# Patient Record
Sex: Female | Born: 1945 | Race: Black or African American | Hispanic: No | Smoking: Former smoker
Health system: Southern US, Community
[De-identification: ages and names within clinical notes are randomized; demographics above are authoritative.]

## PROBLEM LIST (undated history)

## (undated) DIAGNOSIS — J4489 Other specified chronic obstructive pulmonary disease: Secondary | ICD-10-CM

## (undated) DIAGNOSIS — J40 Bronchitis, not specified as acute or chronic: Secondary | ICD-10-CM

## (undated) DIAGNOSIS — I10 Essential (primary) hypertension: Secondary | ICD-10-CM

## (undated) DIAGNOSIS — J939 Pneumothorax, unspecified: Secondary | ICD-10-CM

## (undated) DIAGNOSIS — J449 Chronic obstructive pulmonary disease, unspecified: Secondary | ICD-10-CM

## (undated) DIAGNOSIS — J45909 Unspecified asthma, uncomplicated: Secondary | ICD-10-CM

## (undated) HISTORY — DX: Unspecified asthma, uncomplicated: J45.909

## (undated) HISTORY — DX: Bronchitis, not specified as acute or chronic: J40

## (undated) HISTORY — PX: HIP ARTHROPLASTY: SHX981

## (undated) HISTORY — DX: Essential (primary) hypertension: I10

## (undated) HISTORY — DX: Pneumothorax, unspecified: J93.9

---

## 2012-04-11 ENCOUNTER — Ambulatory Visit: Payer: Self-pay | Admitting: Podiatry

## 2016-01-29 DIAGNOSIS — M0579 Rheumatoid arthritis with rheumatoid factor of multiple sites without organ or systems involvement: Secondary | ICD-10-CM | POA: Diagnosis not present

## 2016-01-29 DIAGNOSIS — Z79899 Other long term (current) drug therapy: Secondary | ICD-10-CM | POA: Diagnosis not present

## 2016-02-11 DIAGNOSIS — M2550 Pain in unspecified joint: Secondary | ICD-10-CM | POA: Diagnosis not present

## 2016-02-11 DIAGNOSIS — I10 Essential (primary) hypertension: Secondary | ICD-10-CM | POA: Diagnosis not present

## 2016-02-11 DIAGNOSIS — E1140 Type 2 diabetes mellitus with diabetic neuropathy, unspecified: Secondary | ICD-10-CM | POA: Diagnosis not present

## 2016-02-11 DIAGNOSIS — Z5181 Encounter for therapeutic drug level monitoring: Secondary | ICD-10-CM | POA: Diagnosis not present

## 2016-02-11 DIAGNOSIS — K219 Gastro-esophageal reflux disease without esophagitis: Secondary | ICD-10-CM | POA: Diagnosis not present

## 2016-02-11 DIAGNOSIS — E119 Type 2 diabetes mellitus without complications: Secondary | ICD-10-CM | POA: Diagnosis not present

## 2016-02-11 DIAGNOSIS — J45909 Unspecified asthma, uncomplicated: Secondary | ICD-10-CM | POA: Diagnosis not present

## 2016-02-11 DIAGNOSIS — E785 Hyperlipidemia, unspecified: Secondary | ICD-10-CM | POA: Diagnosis not present

## 2016-04-08 DIAGNOSIS — I10 Essential (primary) hypertension: Secondary | ICD-10-CM | POA: Diagnosis not present

## 2016-04-08 DIAGNOSIS — E1140 Type 2 diabetes mellitus with diabetic neuropathy, unspecified: Secondary | ICD-10-CM | POA: Diagnosis not present

## 2016-04-08 DIAGNOSIS — M2550 Pain in unspecified joint: Secondary | ICD-10-CM | POA: Diagnosis not present

## 2016-04-08 DIAGNOSIS — E119 Type 2 diabetes mellitus without complications: Secondary | ICD-10-CM | POA: Diagnosis not present

## 2016-04-08 DIAGNOSIS — E785 Hyperlipidemia, unspecified: Secondary | ICD-10-CM | POA: Diagnosis not present

## 2016-04-08 DIAGNOSIS — J45909 Unspecified asthma, uncomplicated: Secondary | ICD-10-CM | POA: Diagnosis not present

## 2016-04-08 DIAGNOSIS — R0602 Shortness of breath: Secondary | ICD-10-CM | POA: Diagnosis not present

## 2016-04-08 DIAGNOSIS — K219 Gastro-esophageal reflux disease without esophagitis: Secondary | ICD-10-CM | POA: Diagnosis not present

## 2016-04-29 DIAGNOSIS — Z79899 Other long term (current) drug therapy: Secondary | ICD-10-CM | POA: Diagnosis not present

## 2016-04-29 DIAGNOSIS — M0579 Rheumatoid arthritis with rheumatoid factor of multiple sites without organ or systems involvement: Secondary | ICD-10-CM | POA: Diagnosis not present

## 2016-05-09 DIAGNOSIS — E1140 Type 2 diabetes mellitus with diabetic neuropathy, unspecified: Secondary | ICD-10-CM | POA: Diagnosis not present

## 2016-05-09 DIAGNOSIS — E119 Type 2 diabetes mellitus without complications: Secondary | ICD-10-CM | POA: Diagnosis not present

## 2016-05-09 DIAGNOSIS — R52 Pain, unspecified: Secondary | ICD-10-CM | POA: Diagnosis not present

## 2016-05-09 DIAGNOSIS — I10 Essential (primary) hypertension: Secondary | ICD-10-CM | POA: Diagnosis not present

## 2016-05-09 DIAGNOSIS — J45909 Unspecified asthma, uncomplicated: Secondary | ICD-10-CM | POA: Diagnosis not present

## 2016-05-09 DIAGNOSIS — K219 Gastro-esophageal reflux disease without esophagitis: Secondary | ICD-10-CM | POA: Diagnosis not present

## 2016-05-09 DIAGNOSIS — E785 Hyperlipidemia, unspecified: Secondary | ICD-10-CM | POA: Diagnosis not present

## 2016-05-22 DIAGNOSIS — Z791 Long term (current) use of non-steroidal anti-inflammatories (NSAID): Secondary | ICD-10-CM | POA: Diagnosis not present

## 2016-05-22 DIAGNOSIS — Z79899 Other long term (current) drug therapy: Secondary | ICD-10-CM | POA: Diagnosis not present

## 2016-05-22 DIAGNOSIS — M25531 Pain in right wrist: Secondary | ICD-10-CM | POA: Diagnosis not present

## 2016-05-22 DIAGNOSIS — M19041 Primary osteoarthritis, right hand: Secondary | ICD-10-CM | POA: Diagnosis not present

## 2016-05-22 DIAGNOSIS — M79641 Pain in right hand: Secondary | ICD-10-CM | POA: Diagnosis not present

## 2016-05-22 DIAGNOSIS — Z7982 Long term (current) use of aspirin: Secondary | ICD-10-CM | POA: Diagnosis not present

## 2016-05-22 DIAGNOSIS — M7989 Other specified soft tissue disorders: Secondary | ICD-10-CM | POA: Diagnosis not present

## 2016-05-22 DIAGNOSIS — I10 Essential (primary) hypertension: Secondary | ICD-10-CM | POA: Diagnosis not present

## 2016-05-22 DIAGNOSIS — M19031 Primary osteoarthritis, right wrist: Secondary | ICD-10-CM | POA: Diagnosis not present

## 2016-07-26 DIAGNOSIS — E785 Hyperlipidemia, unspecified: Secondary | ICD-10-CM | POA: Diagnosis not present

## 2016-07-26 DIAGNOSIS — J45909 Unspecified asthma, uncomplicated: Secondary | ICD-10-CM | POA: Diagnosis not present

## 2016-07-26 DIAGNOSIS — Z0000 Encounter for general adult medical examination without abnormal findings: Secondary | ICD-10-CM | POA: Diagnosis not present

## 2016-07-26 DIAGNOSIS — E119 Type 2 diabetes mellitus without complications: Secondary | ICD-10-CM | POA: Diagnosis not present

## 2016-07-26 DIAGNOSIS — I10 Essential (primary) hypertension: Secondary | ICD-10-CM | POA: Diagnosis not present

## 2016-07-26 DIAGNOSIS — R1013 Epigastric pain: Secondary | ICD-10-CM | POA: Diagnosis not present

## 2016-07-26 DIAGNOSIS — E1140 Type 2 diabetes mellitus with diabetic neuropathy, unspecified: Secondary | ICD-10-CM | POA: Diagnosis not present

## 2016-07-26 DIAGNOSIS — R52 Pain, unspecified: Secondary | ICD-10-CM | POA: Diagnosis not present

## 2016-07-26 DIAGNOSIS — K219 Gastro-esophageal reflux disease without esophagitis: Secondary | ICD-10-CM | POA: Diagnosis not present

## 2016-07-29 DIAGNOSIS — R0602 Shortness of breath: Secondary | ICD-10-CM | POA: Diagnosis not present

## 2016-07-29 DIAGNOSIS — I10 Essential (primary) hypertension: Secondary | ICD-10-CM | POA: Diagnosis not present

## 2016-09-05 DIAGNOSIS — J45909 Unspecified asthma, uncomplicated: Secondary | ICD-10-CM | POA: Diagnosis not present

## 2016-09-05 DIAGNOSIS — E785 Hyperlipidemia, unspecified: Secondary | ICD-10-CM | POA: Diagnosis not present

## 2016-09-05 DIAGNOSIS — I10 Essential (primary) hypertension: Secondary | ICD-10-CM | POA: Diagnosis not present

## 2016-09-05 DIAGNOSIS — E1140 Type 2 diabetes mellitus with diabetic neuropathy, unspecified: Secondary | ICD-10-CM | POA: Diagnosis not present

## 2016-09-05 DIAGNOSIS — R52 Pain, unspecified: Secondary | ICD-10-CM | POA: Diagnosis not present

## 2016-09-05 DIAGNOSIS — R1013 Epigastric pain: Secondary | ICD-10-CM | POA: Diagnosis not present

## 2016-09-05 DIAGNOSIS — E119 Type 2 diabetes mellitus without complications: Secondary | ICD-10-CM | POA: Diagnosis not present

## 2016-09-05 DIAGNOSIS — K219 Gastro-esophageal reflux disease without esophagitis: Secondary | ICD-10-CM | POA: Diagnosis not present

## 2016-10-07 DIAGNOSIS — R1013 Epigastric pain: Secondary | ICD-10-CM | POA: Diagnosis not present

## 2016-12-16 DIAGNOSIS — E119 Type 2 diabetes mellitus without complications: Secondary | ICD-10-CM | POA: Diagnosis not present

## 2016-12-16 DIAGNOSIS — R1013 Epigastric pain: Secondary | ICD-10-CM | POA: Diagnosis not present

## 2016-12-16 DIAGNOSIS — M069 Rheumatoid arthritis, unspecified: Secondary | ICD-10-CM | POA: Diagnosis not present

## 2016-12-16 DIAGNOSIS — R52 Pain, unspecified: Secondary | ICD-10-CM | POA: Diagnosis not present

## 2016-12-16 DIAGNOSIS — Z2821 Immunization not carried out because of patient refusal: Secondary | ICD-10-CM | POA: Diagnosis not present

## 2016-12-16 DIAGNOSIS — E785 Hyperlipidemia, unspecified: Secondary | ICD-10-CM | POA: Diagnosis not present

## 2016-12-16 DIAGNOSIS — I10 Essential (primary) hypertension: Secondary | ICD-10-CM | POA: Diagnosis not present

## 2016-12-16 DIAGNOSIS — K219 Gastro-esophageal reflux disease without esophagitis: Secondary | ICD-10-CM | POA: Diagnosis not present

## 2016-12-16 DIAGNOSIS — J45909 Unspecified asthma, uncomplicated: Secondary | ICD-10-CM | POA: Diagnosis not present

## 2016-12-16 DIAGNOSIS — E1140 Type 2 diabetes mellitus with diabetic neuropathy, unspecified: Secondary | ICD-10-CM | POA: Diagnosis not present

## 2017-04-11 DIAGNOSIS — R52 Pain, unspecified: Secondary | ICD-10-CM | POA: Diagnosis not present

## 2017-04-11 DIAGNOSIS — E1140 Type 2 diabetes mellitus with diabetic neuropathy, unspecified: Secondary | ICD-10-CM | POA: Diagnosis not present

## 2017-04-11 DIAGNOSIS — M069 Rheumatoid arthritis, unspecified: Secondary | ICD-10-CM | POA: Diagnosis not present

## 2017-04-11 DIAGNOSIS — K219 Gastro-esophageal reflux disease without esophagitis: Secondary | ICD-10-CM | POA: Diagnosis not present

## 2017-04-11 DIAGNOSIS — E119 Type 2 diabetes mellitus without complications: Secondary | ICD-10-CM | POA: Diagnosis not present

## 2017-04-11 DIAGNOSIS — I10 Essential (primary) hypertension: Secondary | ICD-10-CM | POA: Diagnosis not present

## 2017-04-11 DIAGNOSIS — R1013 Epigastric pain: Secondary | ICD-10-CM | POA: Diagnosis not present

## 2017-04-11 DIAGNOSIS — E785 Hyperlipidemia, unspecified: Secondary | ICD-10-CM | POA: Diagnosis not present

## 2017-04-11 DIAGNOSIS — J45909 Unspecified asthma, uncomplicated: Secondary | ICD-10-CM | POA: Diagnosis not present

## 2017-04-19 DIAGNOSIS — R600 Localized edema: Secondary | ICD-10-CM | POA: Diagnosis not present

## 2017-04-19 DIAGNOSIS — K047 Periapical abscess without sinus: Secondary | ICD-10-CM | POA: Diagnosis not present

## 2017-04-19 DIAGNOSIS — M272 Inflammatory conditions of jaws: Secondary | ICD-10-CM | POA: Diagnosis not present

## 2017-04-19 DIAGNOSIS — R221 Localized swelling, mass and lump, neck: Secondary | ICD-10-CM | POA: Diagnosis not present

## 2017-04-19 DIAGNOSIS — K029 Dental caries, unspecified: Secondary | ICD-10-CM | POA: Diagnosis not present

## 2017-05-31 DIAGNOSIS — M069 Rheumatoid arthritis, unspecified: Secondary | ICD-10-CM | POA: Diagnosis not present

## 2017-05-31 DIAGNOSIS — I10 Essential (primary) hypertension: Secondary | ICD-10-CM | POA: Diagnosis not present

## 2017-05-31 DIAGNOSIS — E785 Hyperlipidemia, unspecified: Secondary | ICD-10-CM | POA: Diagnosis not present

## 2017-05-31 DIAGNOSIS — E119 Type 2 diabetes mellitus without complications: Secondary | ICD-10-CM | POA: Diagnosis not present

## 2017-05-31 DIAGNOSIS — R1013 Epigastric pain: Secondary | ICD-10-CM | POA: Diagnosis not present

## 2017-05-31 DIAGNOSIS — J45909 Unspecified asthma, uncomplicated: Secondary | ICD-10-CM | POA: Diagnosis not present

## 2017-05-31 DIAGNOSIS — K219 Gastro-esophageal reflux disease without esophagitis: Secondary | ICD-10-CM | POA: Diagnosis not present

## 2017-05-31 DIAGNOSIS — E1140 Type 2 diabetes mellitus with diabetic neuropathy, unspecified: Secondary | ICD-10-CM | POA: Diagnosis not present

## 2017-07-14 DIAGNOSIS — Z0110 Encounter for examination of ears and hearing without abnormal findings: Secondary | ICD-10-CM | POA: Diagnosis not present

## 2017-07-14 DIAGNOSIS — M069 Rheumatoid arthritis, unspecified: Secondary | ICD-10-CM | POA: Diagnosis not present

## 2017-07-14 DIAGNOSIS — E785 Hyperlipidemia, unspecified: Secondary | ICD-10-CM | POA: Diagnosis not present

## 2017-07-14 DIAGNOSIS — E1140 Type 2 diabetes mellitus with diabetic neuropathy, unspecified: Secondary | ICD-10-CM | POA: Diagnosis not present

## 2017-07-14 DIAGNOSIS — J45909 Unspecified asthma, uncomplicated: Secondary | ICD-10-CM | POA: Diagnosis not present

## 2017-07-14 DIAGNOSIS — E119 Type 2 diabetes mellitus without complications: Secondary | ICD-10-CM | POA: Diagnosis not present

## 2017-07-14 DIAGNOSIS — K219 Gastro-esophageal reflux disease without esophagitis: Secondary | ICD-10-CM | POA: Diagnosis not present

## 2017-07-14 DIAGNOSIS — I10 Essential (primary) hypertension: Secondary | ICD-10-CM | POA: Diagnosis not present

## 2017-07-14 DIAGNOSIS — R1013 Epigastric pain: Secondary | ICD-10-CM | POA: Diagnosis not present

## 2017-07-31 DIAGNOSIS — M069 Rheumatoid arthritis, unspecified: Secondary | ICD-10-CM | POA: Diagnosis not present

## 2017-07-31 DIAGNOSIS — K219 Gastro-esophageal reflux disease without esophagitis: Secondary | ICD-10-CM | POA: Diagnosis not present

## 2017-07-31 DIAGNOSIS — E1140 Type 2 diabetes mellitus with diabetic neuropathy, unspecified: Secondary | ICD-10-CM | POA: Diagnosis not present

## 2017-07-31 DIAGNOSIS — Z0000 Encounter for general adult medical examination without abnormal findings: Secondary | ICD-10-CM | POA: Diagnosis not present

## 2017-07-31 DIAGNOSIS — E785 Hyperlipidemia, unspecified: Secondary | ICD-10-CM | POA: Diagnosis not present

## 2017-07-31 DIAGNOSIS — I10 Essential (primary) hypertension: Secondary | ICD-10-CM | POA: Diagnosis not present

## 2017-07-31 DIAGNOSIS — R1013 Epigastric pain: Secondary | ICD-10-CM | POA: Diagnosis not present

## 2017-07-31 DIAGNOSIS — J45909 Unspecified asthma, uncomplicated: Secondary | ICD-10-CM | POA: Diagnosis not present

## 2017-07-31 DIAGNOSIS — E119 Type 2 diabetes mellitus without complications: Secondary | ICD-10-CM | POA: Diagnosis not present

## 2017-10-05 DIAGNOSIS — I10 Essential (primary) hypertension: Secondary | ICD-10-CM | POA: Diagnosis not present

## 2017-10-05 DIAGNOSIS — R1013 Epigastric pain: Secondary | ICD-10-CM | POA: Diagnosis not present

## 2017-10-05 DIAGNOSIS — M069 Rheumatoid arthritis, unspecified: Secondary | ICD-10-CM | POA: Diagnosis not present

## 2017-10-05 DIAGNOSIS — K219 Gastro-esophageal reflux disease without esophagitis: Secondary | ICD-10-CM | POA: Diagnosis not present

## 2017-10-05 DIAGNOSIS — E1140 Type 2 diabetes mellitus with diabetic neuropathy, unspecified: Secondary | ICD-10-CM | POA: Diagnosis not present

## 2017-10-05 DIAGNOSIS — E785 Hyperlipidemia, unspecified: Secondary | ICD-10-CM | POA: Diagnosis not present

## 2017-10-05 DIAGNOSIS — J45909 Unspecified asthma, uncomplicated: Secondary | ICD-10-CM | POA: Diagnosis not present

## 2017-10-05 DIAGNOSIS — E119 Type 2 diabetes mellitus without complications: Secondary | ICD-10-CM | POA: Diagnosis not present

## 2017-10-30 DIAGNOSIS — J45909 Unspecified asthma, uncomplicated: Secondary | ICD-10-CM | POA: Diagnosis not present

## 2017-10-30 DIAGNOSIS — R1013 Epigastric pain: Secondary | ICD-10-CM | POA: Diagnosis not present

## 2017-10-30 DIAGNOSIS — E119 Type 2 diabetes mellitus without complications: Secondary | ICD-10-CM | POA: Diagnosis not present

## 2017-10-30 DIAGNOSIS — I10 Essential (primary) hypertension: Secondary | ICD-10-CM | POA: Diagnosis not present

## 2017-10-30 DIAGNOSIS — E1140 Type 2 diabetes mellitus with diabetic neuropathy, unspecified: Secondary | ICD-10-CM | POA: Diagnosis not present

## 2017-10-30 DIAGNOSIS — E785 Hyperlipidemia, unspecified: Secondary | ICD-10-CM | POA: Diagnosis not present

## 2017-10-30 DIAGNOSIS — K219 Gastro-esophageal reflux disease without esophagitis: Secondary | ICD-10-CM | POA: Diagnosis not present

## 2017-10-30 DIAGNOSIS — M069 Rheumatoid arthritis, unspecified: Secondary | ICD-10-CM | POA: Diagnosis not present

## 2017-11-13 DIAGNOSIS — K219 Gastro-esophageal reflux disease without esophagitis: Secondary | ICD-10-CM | POA: Diagnosis not present

## 2017-11-13 DIAGNOSIS — M545 Low back pain: Secondary | ICD-10-CM | POA: Diagnosis not present

## 2017-11-13 DIAGNOSIS — E1140 Type 2 diabetes mellitus with diabetic neuropathy, unspecified: Secondary | ICD-10-CM | POA: Diagnosis not present

## 2017-11-13 DIAGNOSIS — M069 Rheumatoid arthritis, unspecified: Secondary | ICD-10-CM | POA: Diagnosis not present

## 2017-11-13 DIAGNOSIS — Z23 Encounter for immunization: Secondary | ICD-10-CM | POA: Diagnosis not present

## 2017-11-13 DIAGNOSIS — R1013 Epigastric pain: Secondary | ICD-10-CM | POA: Diagnosis not present

## 2017-11-13 DIAGNOSIS — I10 Essential (primary) hypertension: Secondary | ICD-10-CM | POA: Diagnosis not present

## 2017-11-13 DIAGNOSIS — E785 Hyperlipidemia, unspecified: Secondary | ICD-10-CM | POA: Diagnosis not present

## 2017-11-13 DIAGNOSIS — J45909 Unspecified asthma, uncomplicated: Secondary | ICD-10-CM | POA: Diagnosis not present

## 2017-11-13 DIAGNOSIS — E119 Type 2 diabetes mellitus without complications: Secondary | ICD-10-CM | POA: Diagnosis not present

## 2017-11-13 DIAGNOSIS — Z5181 Encounter for therapeutic drug level monitoring: Secondary | ICD-10-CM | POA: Diagnosis not present

## 2018-03-05 DIAGNOSIS — E785 Hyperlipidemia, unspecified: Secondary | ICD-10-CM | POA: Diagnosis not present

## 2018-03-05 DIAGNOSIS — E1140 Type 2 diabetes mellitus with diabetic neuropathy, unspecified: Secondary | ICD-10-CM | POA: Diagnosis not present

## 2018-03-05 DIAGNOSIS — K219 Gastro-esophageal reflux disease without esophagitis: Secondary | ICD-10-CM | POA: Diagnosis not present

## 2018-03-05 DIAGNOSIS — I10 Essential (primary) hypertension: Secondary | ICD-10-CM | POA: Diagnosis not present

## 2018-03-05 DIAGNOSIS — E119 Type 2 diabetes mellitus without complications: Secondary | ICD-10-CM | POA: Diagnosis not present

## 2018-03-05 DIAGNOSIS — M069 Rheumatoid arthritis, unspecified: Secondary | ICD-10-CM | POA: Diagnosis not present

## 2018-03-05 DIAGNOSIS — J45909 Unspecified asthma, uncomplicated: Secondary | ICD-10-CM | POA: Diagnosis not present

## 2018-03-05 DIAGNOSIS — R1013 Epigastric pain: Secondary | ICD-10-CM | POA: Diagnosis not present

## 2018-04-23 DIAGNOSIS — M069 Rheumatoid arthritis, unspecified: Secondary | ICD-10-CM | POA: Diagnosis not present

## 2018-04-23 DIAGNOSIS — E119 Type 2 diabetes mellitus without complications: Secondary | ICD-10-CM | POA: Diagnosis not present

## 2018-04-23 DIAGNOSIS — E785 Hyperlipidemia, unspecified: Secondary | ICD-10-CM | POA: Diagnosis not present

## 2018-04-23 DIAGNOSIS — K219 Gastro-esophageal reflux disease without esophagitis: Secondary | ICD-10-CM | POA: Diagnosis not present

## 2018-04-23 DIAGNOSIS — E1140 Type 2 diabetes mellitus with diabetic neuropathy, unspecified: Secondary | ICD-10-CM | POA: Diagnosis not present

## 2018-04-23 DIAGNOSIS — R1013 Epigastric pain: Secondary | ICD-10-CM | POA: Diagnosis not present

## 2018-04-23 DIAGNOSIS — I10 Essential (primary) hypertension: Secondary | ICD-10-CM | POA: Diagnosis not present

## 2018-04-23 DIAGNOSIS — J45909 Unspecified asthma, uncomplicated: Secondary | ICD-10-CM | POA: Diagnosis not present

## 2018-06-04 DIAGNOSIS — E119 Type 2 diabetes mellitus without complications: Secondary | ICD-10-CM | POA: Diagnosis not present

## 2018-06-04 DIAGNOSIS — R1013 Epigastric pain: Secondary | ICD-10-CM | POA: Diagnosis not present

## 2018-06-04 DIAGNOSIS — Z1329 Encounter for screening for other suspected endocrine disorder: Secondary | ICD-10-CM | POA: Diagnosis not present

## 2018-06-04 DIAGNOSIS — J4530 Mild persistent asthma, uncomplicated: Secondary | ICD-10-CM | POA: Diagnosis not present

## 2018-06-04 DIAGNOSIS — E782 Mixed hyperlipidemia: Secondary | ICD-10-CM | POA: Diagnosis not present

## 2018-06-04 DIAGNOSIS — Z5181 Encounter for therapeutic drug level monitoring: Secondary | ICD-10-CM | POA: Diagnosis not present

## 2018-06-04 DIAGNOSIS — K219 Gastro-esophageal reflux disease without esophagitis: Secondary | ICD-10-CM | POA: Diagnosis not present

## 2018-06-04 DIAGNOSIS — M069 Rheumatoid arthritis, unspecified: Secondary | ICD-10-CM | POA: Diagnosis not present

## 2018-06-04 DIAGNOSIS — E1140 Type 2 diabetes mellitus with diabetic neuropathy, unspecified: Secondary | ICD-10-CM | POA: Diagnosis not present

## 2018-06-04 DIAGNOSIS — Z131 Encounter for screening for diabetes mellitus: Secondary | ICD-10-CM | POA: Diagnosis not present

## 2018-06-04 DIAGNOSIS — I10 Essential (primary) hypertension: Secondary | ICD-10-CM | POA: Diagnosis not present

## 2018-06-04 DIAGNOSIS — Z1389 Encounter for screening for other disorder: Secondary | ICD-10-CM | POA: Diagnosis not present

## 2018-08-01 DIAGNOSIS — M069 Rheumatoid arthritis, unspecified: Secondary | ICD-10-CM | POA: Diagnosis not present

## 2018-08-01 DIAGNOSIS — I10 Essential (primary) hypertension: Secondary | ICD-10-CM | POA: Diagnosis not present

## 2018-08-01 DIAGNOSIS — Z0001 Encounter for general adult medical examination with abnormal findings: Secondary | ICD-10-CM | POA: Diagnosis not present

## 2018-08-01 DIAGNOSIS — E782 Mixed hyperlipidemia: Secondary | ICD-10-CM | POA: Diagnosis not present

## 2018-08-01 DIAGNOSIS — R1013 Epigastric pain: Secondary | ICD-10-CM | POA: Diagnosis not present

## 2018-08-01 DIAGNOSIS — J4530 Mild persistent asthma, uncomplicated: Secondary | ICD-10-CM | POA: Diagnosis not present

## 2018-08-01 DIAGNOSIS — E119 Type 2 diabetes mellitus without complications: Secondary | ICD-10-CM | POA: Diagnosis not present

## 2018-08-01 DIAGNOSIS — E1140 Type 2 diabetes mellitus with diabetic neuropathy, unspecified: Secondary | ICD-10-CM | POA: Diagnosis not present

## 2018-08-01 DIAGNOSIS — K219 Gastro-esophageal reflux disease without esophagitis: Secondary | ICD-10-CM | POA: Diagnosis not present

## 2019-03-05 DIAGNOSIS — I10 Essential (primary) hypertension: Secondary | ICD-10-CM | POA: Diagnosis not present

## 2019-03-05 DIAGNOSIS — K219 Gastro-esophageal reflux disease without esophagitis: Secondary | ICD-10-CM | POA: Diagnosis not present

## 2019-03-05 DIAGNOSIS — M069 Rheumatoid arthritis, unspecified: Secondary | ICD-10-CM | POA: Diagnosis not present

## 2019-03-05 DIAGNOSIS — E782 Mixed hyperlipidemia: Secondary | ICD-10-CM | POA: Diagnosis not present

## 2019-03-05 DIAGNOSIS — E1140 Type 2 diabetes mellitus with diabetic neuropathy, unspecified: Secondary | ICD-10-CM | POA: Diagnosis not present

## 2019-03-05 DIAGNOSIS — E119 Type 2 diabetes mellitus without complications: Secondary | ICD-10-CM | POA: Diagnosis not present

## 2019-03-05 DIAGNOSIS — J4530 Mild persistent asthma, uncomplicated: Secondary | ICD-10-CM | POA: Diagnosis not present

## 2019-04-19 DIAGNOSIS — K219 Gastro-esophageal reflux disease without esophagitis: Secondary | ICD-10-CM | POA: Diagnosis not present

## 2019-04-19 DIAGNOSIS — M1711 Unilateral primary osteoarthritis, right knee: Secondary | ICD-10-CM | POA: Diagnosis not present

## 2019-04-19 DIAGNOSIS — E119 Type 2 diabetes mellitus without complications: Secondary | ICD-10-CM | POA: Diagnosis not present

## 2019-04-19 DIAGNOSIS — Z1329 Encounter for screening for other suspected endocrine disorder: Secondary | ICD-10-CM | POA: Diagnosis not present

## 2019-04-19 DIAGNOSIS — J4530 Mild persistent asthma, uncomplicated: Secondary | ICD-10-CM | POA: Diagnosis not present

## 2019-04-19 DIAGNOSIS — I10 Essential (primary) hypertension: Secondary | ICD-10-CM | POA: Diagnosis not present

## 2019-04-19 DIAGNOSIS — E1140 Type 2 diabetes mellitus with diabetic neuropathy, unspecified: Secondary | ICD-10-CM | POA: Diagnosis not present

## 2019-04-19 DIAGNOSIS — E782 Mixed hyperlipidemia: Secondary | ICD-10-CM | POA: Diagnosis not present

## 2019-09-25 DIAGNOSIS — E782 Mixed hyperlipidemia: Secondary | ICD-10-CM | POA: Diagnosis not present

## 2019-09-25 DIAGNOSIS — J4530 Mild persistent asthma, uncomplicated: Secondary | ICD-10-CM | POA: Diagnosis not present

## 2019-09-25 DIAGNOSIS — K219 Gastro-esophageal reflux disease without esophagitis: Secondary | ICD-10-CM | POA: Diagnosis not present

## 2019-09-25 DIAGNOSIS — E119 Type 2 diabetes mellitus without complications: Secondary | ICD-10-CM | POA: Diagnosis not present

## 2019-09-25 DIAGNOSIS — I10 Essential (primary) hypertension: Secondary | ICD-10-CM | POA: Diagnosis not present

## 2019-09-25 DIAGNOSIS — E1140 Type 2 diabetes mellitus with diabetic neuropathy, unspecified: Secondary | ICD-10-CM | POA: Diagnosis not present

## 2019-09-25 DIAGNOSIS — M1711 Unilateral primary osteoarthritis, right knee: Secondary | ICD-10-CM | POA: Diagnosis not present

## 2019-12-16 DIAGNOSIS — E1142 Type 2 diabetes mellitus with diabetic polyneuropathy: Secondary | ICD-10-CM | POA: Diagnosis not present

## 2019-12-16 DIAGNOSIS — I10 Essential (primary) hypertension: Secondary | ICD-10-CM | POA: Diagnosis not present

## 2019-12-16 DIAGNOSIS — E119 Type 2 diabetes mellitus without complications: Secondary | ICD-10-CM | POA: Diagnosis not present

## 2019-12-16 DIAGNOSIS — M1711 Unilateral primary osteoarthritis, right knee: Secondary | ICD-10-CM | POA: Diagnosis not present

## 2019-12-16 DIAGNOSIS — E782 Mixed hyperlipidemia: Secondary | ICD-10-CM | POA: Diagnosis not present

## 2019-12-16 DIAGNOSIS — J4530 Mild persistent asthma, uncomplicated: Secondary | ICD-10-CM | POA: Diagnosis not present

## 2019-12-16 DIAGNOSIS — K219 Gastro-esophageal reflux disease without esophagitis: Secondary | ICD-10-CM | POA: Diagnosis not present

## 2021-04-14 ENCOUNTER — Ambulatory Visit (INDEPENDENT_AMBULATORY_CARE_PROVIDER_SITE_OTHER): Payer: Medicare Other | Admitting: Pulmonary Disease

## 2021-04-14 ENCOUNTER — Encounter: Payer: Self-pay | Admitting: Pulmonary Disease

## 2021-04-14 VITALS — BP 122/60 | HR 66 | Temp 97.6°F | Ht 65.0 in | Wt 95.0 lb

## 2021-04-14 DIAGNOSIS — C3490 Malignant neoplasm of unspecified part of unspecified bronchus or lung: Secondary | ICD-10-CM

## 2021-04-14 DIAGNOSIS — R918 Other nonspecific abnormal finding of lung field: Secondary | ICD-10-CM | POA: Diagnosis not present

## 2021-04-14 MED ORDER — ETODOLAC 400 MG PO TABS: 400.0000 mg | ORAL_TABLET | Freq: Two times a day (BID) | ORAL | 0 refills | Status: DC | PRN

## 2021-04-14 NOTE — Patient Instructions (Signed)
Thank you for visiting Dr. Valeta Harms at [facility]. ?Today we recommend the following: ? ?Orders Placed This Encounter  ?Procedures  ? NM PET Image Initial (PI) Skull Base To Thigh (F-18 FDG)  ? ?Meds ordered this encounter  ?Medications  ? etodolac (LODINE) 400 MG tablet  ?  Sig: Take 1 tablet (400 mg total) by mouth every 12 (twelve) hours as needed.  ?  Dispense:  60 tablet  ?  Refill:  0  ? ?Follow up with [place] in a few weeks after PET CT  ? ?Return in about 3 weeks (around 05/05/2021) for with Eric Form, NP, or Dr. Valeta Harms. ? ? ? ?Please do your part to reduce the spread of COVID-19.  ? ?

## 2021-04-14 NOTE — Progress Notes (Signed)
? ?Synopsis: Referred in March 2023 for lung mass by [name], MD ? ?Subjective:  ? ?PATIENT ID: Anna Finley GENDER: female DOB: 1945/11/09, MRN: [id number] ? ?Chief Complaint  ?Patient presents with  ? Consult  ?  RLL lung mass   ? ? ?This is a 76 year old female, past medical history of asthma, hypertension, COPD.  Longstanding history of smoking quit in approximately 10 years ago after having a spontaneous pneumothorax.  She had symptoms of pneumonia had a chest x-ray with an abnormality on the right side led to CT imaging which revealed a large right lower lobe mass with concern for postobstructive pneumonia.  She was started on Levaquin by primary care provider.  She started to feel somewhat better.  No longer having fevers.  I was able to review her CT imaging in epic care everywhere today with patient.  Also reviewed office primary care notes from Dr. Vista Lawman, [facility].  We appreciate the referral. ? ? ?Past Medical History:  ?Diagnosis Date  ? Asthma   ? Bronchitis   ? HTN (hypertension)   ? Pneumothorax   ?  ? ?Family History  ?Problem Relation Age of Onset  ? Cancer Father   ?  ? ?Past Surgical History:  ?Procedure Laterality Date  ? HIP ARTHROPLASTY    ? ? ?Social History  ? ?Socioeconomic History  ? Marital status: Widowed  ?  Spouse name: Not on file  ? Number of children: Not on file  ? Years of education: Not on file  ? Highest education level: Not on file  ?Occupational History  ? Not on file  ?Tobacco Use  ? Smoking status: Former  ?  Types: Cigarettes  ? Smokeless tobacco: Former  ?  Quit date: 2013  ?Substance and Sexual Activity  ? Alcohol use: Not on file  ? Drug use: Not on file  ? Sexual activity: Not on file  ?Other Topics Concern  ? Not on file  ?Social History Narrative  ? Not on file  ? ?Social Determinants of Health  ? ?Financial Resource Strain: Not on file  ?Food Insecurity: Not on file  ?Transportation Needs: Not on file  ?Physical Activity: Not on file   ?Stress: Not on file  ?Social Connections: Not on file  ?Intimate Partner Violence: Not on file  ?  ? ?Not on File  ? ?No outpatient medications prior to visit.  ? ?No facility-administered medications prior to visit.  ? ? ?Review of Systems  ?Constitutional:  Positive for weight loss. Negative for chills, fever and malaise/fatigue.  ?HENT:  Negative for hearing loss, sore throat and tinnitus.   ?Eyes:  Negative for blurred vision and double vision.  ?Respiratory:  Positive for shortness of breath. Negative for cough, hemoptysis, sputum production, wheezing and stridor.   ?Cardiovascular:  Negative for chest pain, palpitations, orthopnea, leg swelling and PND.  ?Gastrointestinal:  Negative for abdominal pain, constipation, diarrhea, heartburn, nausea and vomiting.  ?Genitourinary:  Negative for dysuria, hematuria and urgency.  ?Musculoskeletal:  Negative for joint pain and myalgias.  ?Skin:  Negative for itching and rash.  ?Neurological:  Negative for dizziness, tingling, weakness and headaches.  ?Endo/Heme/Allergies:  Negative for environmental allergies. Does not bruise/bleed easily.  ?Psychiatric/Behavioral:  Negative for depression. The patient is not nervous/anxious and does not have insomnia.   ?All other systems reviewed and are negative. ? ? ?Objective:  ?Physical Exam ?Vitals reviewed.  ?Constitutional:   ?   General: She is not in acute distress. ?  Appearance: She is well-developed.  ?   Comments: Frail thin, muscle wasting  ?HENT:  ?   Head: Normocephalic and atraumatic.  ?Eyes:  ?   General: No scleral icterus. ?   Conjunctiva/sclera: Conjunctivae normal.  ?   Pupils: Pupils are equal, round, and reactive to light.  ?Neck:  ?   Vascular: No JVD.  ?   Trachea: No tracheal deviation.  ?Cardiovascular:  ?   Rate and Rhythm: Normal rate and regular rhythm.  ?   Heart sounds: Normal heart sounds. No murmur heard. ?Pulmonary:  ?   Effort: Pulmonary effort is normal. No tachypnea, accessory muscle usage or  respiratory distress.  ?   Breath sounds: No stridor. No wheezing, rhonchi or rales.  ?   Comments: Diminished breath sounds bilaterally ?Abdominal:  ?   General: There is no distension.  ?   Palpations: Abdomen is soft.  ?   Tenderness: There is no abdominal tenderness.  ?Musculoskeletal:     ?   General: No tenderness.  ?   Cervical back: Neck supple.  ?Lymphadenopathy:  ?   Cervical: No cervical adenopathy.  ?Skin: ?   General: Skin is warm and dry.  ?   Capillary Refill: Capillary refill takes less than 2 seconds.  ?   Findings: No rash.  ?Neurological:  ?   Mental Status: She is alert and oriented to person, place, and time.  ?Psychiatric:     ?   Behavior: Behavior normal.  ? ? ? ?Vitals:  ? 04/14/21 1514  ?BP: 122/60  ?Pulse: 66  ?Temp: 97.6 ?F (36.4 ?C)  ?TempSrc: Oral  ?SpO2: 96%  ?Weight: 95 lb (43.1 kg)  ?Height: 5\' 5"  (1.651 m)  ? ?96% on RA ?BMI Readings from Last 3 Encounters:  ?04/14/21 15.81 kg/m?  ? ?Wt Readings from Last 3 Encounters:  ?04/14/21 95 lb (43.1 kg)  ? ? ? ?CBC ?No results found for: WBC, RBC, HGB, HCT, PLT, MCV, MCH, MCHC, RDW, LYMPHSABS, MONOABS, EOSABS, BASOSABS ? ? ? ?Chest Imaging: ?CT chest 03/26/2021: ?Reveals a large right lower lobe mass concerning for primary malignancy there is also postobstructive pneumonitis.  Patient was started on Levaquin.  There is an enlarged subcarinal node. ?The patient's images have been independently reviewed by me.   ? ?Pulmonary Functions Testing Results: ?   ? View : No data to display.  ?  ?  ?  ? ? ?FeNO:  ? ?Pathology:  ? ?Echocardiogram:  ? ?Heart Catheterization:  ? ?   ?Assessment & Plan:  ? ?  ICD-10-CM   ?1. 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  ?2. Right lower lobe lung mass  R91.8 NM PET Image Initial (PI) Skull Base To Thigh (F-18 FDG)  ?  ? ? ?Discussion: ? ?This is a 76 year old female, past medical history of tobacco use.   Ongoing weight loss for the past few months.  Had pneumonia, concern for postobstructive pneumonia treated with Levaquin by primary care doctor.  Repeat CT scan of the chest reveals large mass in the right lower lobe with associated subcarinal adenopathy concerning for advanced age bronchogenic carcinoma. ? ?Plan: ?I am unable to review these images in the office today. ?I can see the report that was completed at [place]. ?From respiratory standpoint she is doing okay.  She is experiencing lower right sided back pain.  I suspect she probably either having ongoing pleurisy from the tumor or potential postobstructive pneumonitis. ?I gave her a prescription for etodolac. ?She needs a PET scan ASAP. ?And then we will plan for a procedure for biopsy. ?Hopefully see her back in 1 to 2 weeks in clinic after her PET scan is complete. ? ? ? ?Current Outpatient Medications:  ?  etodolac (LODINE) 400 MG tablet, Take 1 tablet (400 mg total) by mouth every 12 (twelve) hours as needed., Disp: 60 tablet, Rfl: 0 ? ?I spent 62 minutes dedicated to the care of this patient on the date of this encounter to include pre-visit review of records, face-to-face time with the patient discussing conditions above, post visit ordering of testing, clinical documentation with the electronic health record, making appropriate referrals as documented, and communicating necessary findings to members of the patients care team.  ? ?Garner Nash, DO ?[facility] ?04/14/2021 4:59 PM   ? ?

## 2021-04-18 ENCOUNTER — Encounter (HOSPITAL_BASED_OUTPATIENT_CLINIC_OR_DEPARTMENT_OTHER): Payer: Self-pay | Admitting: Urology

## 2021-04-18 ENCOUNTER — Inpatient Hospital Stay (HOSPITAL_BASED_OUTPATIENT_CLINIC_OR_DEPARTMENT_OTHER)
Admission: EM | Admit: 2021-04-18 | Discharge: 2021-04-27 | DRG: 542 | Disposition: A | Discharge status: Home / Self Care | Payer: Medicare Other | Attending: Family Medicine | Admitting: Family Medicine

## 2021-04-18 ENCOUNTER — Emergency Department (HOSPITAL_COMMUNITY): Payer: Medicare Other

## 2021-04-18 ENCOUNTER — Emergency Department (HOSPITAL_BASED_OUTPATIENT_CLINIC_OR_DEPARTMENT_OTHER): Payer: Medicare Other

## 2021-04-18 ENCOUNTER — Other Ambulatory Visit: Payer: Self-pay

## 2021-04-18 DIAGNOSIS — R64 Cachexia: Secondary | ICD-10-CM | POA: Diagnosis present

## 2021-04-18 DIAGNOSIS — I8290 Acute embolism and thrombosis of unspecified vein: Secondary | ICD-10-CM | POA: Insufficient documentation

## 2021-04-18 DIAGNOSIS — M8448XA Pathological fracture, other site, initial encounter for fracture: Secondary | ICD-10-CM | POA: Diagnosis present

## 2021-04-18 DIAGNOSIS — I10 Essential (primary) hypertension: Secondary | ICD-10-CM | POA: Diagnosis present

## 2021-04-18 DIAGNOSIS — R627 Adult failure to thrive: Secondary | ICD-10-CM | POA: Diagnosis present

## 2021-04-18 DIAGNOSIS — Z791 Long term (current) use of non-steroidal anti-inflammatories (NSAID): Secondary | ICD-10-CM

## 2021-04-18 DIAGNOSIS — E43 Unspecified severe protein-calorie malnutrition: Secondary | ICD-10-CM | POA: Insufficient documentation

## 2021-04-18 DIAGNOSIS — R262 Difficulty in walking, not elsewhere classified: Secondary | ICD-10-CM | POA: Diagnosis present

## 2021-04-18 DIAGNOSIS — Z823 Family history of stroke: Secondary | ICD-10-CM

## 2021-04-18 DIAGNOSIS — R599 Enlarged lymph nodes, unspecified: Secondary | ICD-10-CM | POA: Diagnosis present

## 2021-04-18 DIAGNOSIS — C7801 Secondary malignant neoplasm of right lung: Secondary | ICD-10-CM | POA: Diagnosis present

## 2021-04-18 DIAGNOSIS — I82C11 Acute embolism and thrombosis of right internal jugular vein: Secondary | ICD-10-CM | POA: Diagnosis present

## 2021-04-18 DIAGNOSIS — Z800 Family history of malignant neoplasm of digestive organs: Secondary | ICD-10-CM

## 2021-04-18 DIAGNOSIS — C7951 Secondary malignant neoplasm of bone: Secondary | ICD-10-CM | POA: Diagnosis not present

## 2021-04-18 DIAGNOSIS — Z7984 Long term (current) use of oral hypoglycemic drugs: Secondary | ICD-10-CM

## 2021-04-18 DIAGNOSIS — J4489 Other specified chronic obstructive pulmonary disease: Secondary | ICD-10-CM | POA: Diagnosis present

## 2021-04-18 DIAGNOSIS — Z87891 Personal history of nicotine dependence: Secondary | ICD-10-CM

## 2021-04-18 DIAGNOSIS — R519 Headache, unspecified: Secondary | ICD-10-CM

## 2021-04-18 DIAGNOSIS — G43909 Migraine, unspecified, not intractable, without status migrainosus: Secondary | ICD-10-CM | POA: Diagnosis present

## 2021-04-18 DIAGNOSIS — M48061 Spinal stenosis, lumbar region without neurogenic claudication: Secondary | ICD-10-CM | POA: Diagnosis present

## 2021-04-18 DIAGNOSIS — R930 Abnormal findings on diagnostic imaging of skull and head, not elsewhere classified: Secondary | ICD-10-CM

## 2021-04-18 DIAGNOSIS — C3431 Malignant neoplasm of lower lobe, right bronchus or lung: Secondary | ICD-10-CM | POA: Diagnosis present

## 2021-04-18 DIAGNOSIS — R29898 Other symptoms and signs involving the musculoskeletal system: Principal | ICD-10-CM

## 2021-04-18 DIAGNOSIS — E876 Hypokalemia: Secondary | ICD-10-CM

## 2021-04-18 DIAGNOSIS — J449 Chronic obstructive pulmonary disease, unspecified: Secondary | ICD-10-CM | POA: Diagnosis present

## 2021-04-18 DIAGNOSIS — G9529 Other cord compression: Secondary | ICD-10-CM | POA: Diagnosis present

## 2021-04-18 DIAGNOSIS — E871 Hypo-osmolality and hyponatremia: Secondary | ICD-10-CM

## 2021-04-18 DIAGNOSIS — G893 Neoplasm related pain (acute) (chronic): Secondary | ICD-10-CM

## 2021-04-18 DIAGNOSIS — C771 Secondary and unspecified malignant neoplasm of intrathoracic lymph nodes: Secondary | ICD-10-CM | POA: Diagnosis present

## 2021-04-18 DIAGNOSIS — G992 Myelopathy in diseases classified elsewhere: Secondary | ICD-10-CM | POA: Diagnosis present

## 2021-04-18 DIAGNOSIS — G9520 Unspecified cord compression: Secondary | ICD-10-CM | POA: Diagnosis present

## 2021-04-18 DIAGNOSIS — Z79899 Other long term (current) drug therapy: Secondary | ICD-10-CM

## 2021-04-18 DIAGNOSIS — Z7189 Other specified counseling: Secondary | ICD-10-CM

## 2021-04-18 DIAGNOSIS — J440 Chronic obstructive pulmonary disease with acute lower respiratory infection: Secondary | ICD-10-CM | POA: Diagnosis present

## 2021-04-18 DIAGNOSIS — Z681 Body mass index (BMI) 19 or less, adult: Secondary | ICD-10-CM

## 2021-04-18 DIAGNOSIS — R042 Hemoptysis: Secondary | ICD-10-CM

## 2021-04-18 HISTORY — DX: Chronic obstructive pulmonary disease, unspecified: J44.9

## 2021-04-18 HISTORY — DX: Other specified chronic obstructive pulmonary disease: J44.89

## 2021-04-18 LAB — CBC WITH DIFFERENTIAL/PLATELET
Abs Immature Granulocytes: 0.03 10*3/uL (ref 0.00–0.07)
Basophils Absolute: 0 10*3/uL (ref 0.0–0.1)
Basophils Relative: 0 %
Eosinophils Absolute: 0.1 10*3/uL (ref 0.0–0.5)
Eosinophils Relative: 1 %
HCT: 29.9 % — ABNORMAL LOW (ref 36.0–46.0)
Hemoglobin: 9.9 g/dL — ABNORMAL LOW (ref 12.0–15.0)
Immature Granulocytes: 0 %
Lymphocytes Relative: 25 %
Lymphs Abs: 2.4 10*3/uL (ref 0.7–4.0)
MCH: 28.1 pg (ref 26.0–34.0)
MCHC: 33.1 g/dL (ref 30.0–36.0)
MCV: 84.9 fL (ref 80.0–100.0)
Monocytes Absolute: 0.8 10*3/uL (ref 0.1–1.0)
Monocytes Relative: 8 %
Neutro Abs: 6.3 10*3/uL (ref 1.7–7.7)
Neutrophils Relative %: 66 %
Platelets: 376 10*3/uL (ref 150–400)
RBC: 3.52 MIL/uL — ABNORMAL LOW (ref 3.87–5.11)
RDW: 16.8 % — ABNORMAL HIGH (ref 11.5–15.5)
WBC: 9.7 10*3/uL (ref 4.0–10.5)
nRBC: 0 % (ref 0.0–0.2)

## 2021-04-18 IMAGING — CT CT HEAD W/O CM
4 series · 16 of 47 positions shown, 18 images · non-contrast
Comparison: [DATE]

CLINICAL DATA: Ataxia.  Brain metastasis suspected.



[Series 2: head wo · axial · 0.42mm/px · z∈[-182,-66]mm · 7 of 31 slices shown, 9 images]
[im 4/31  brain]
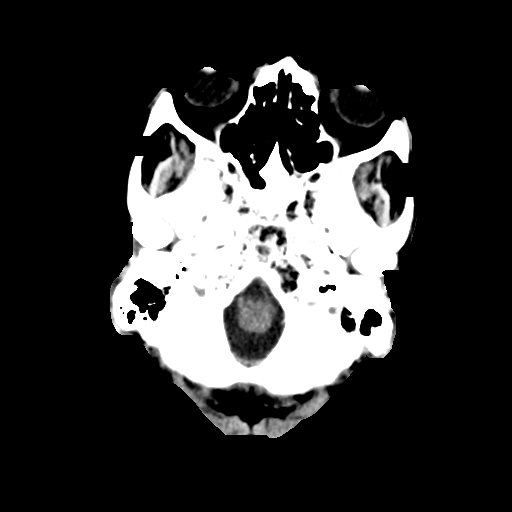
[im 4/31  bone]
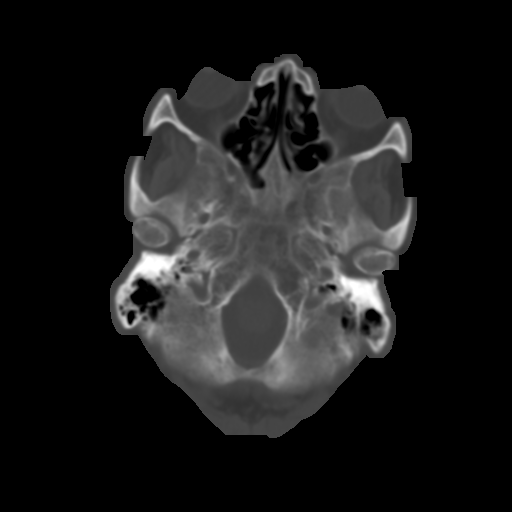
[im 8/31  brain]
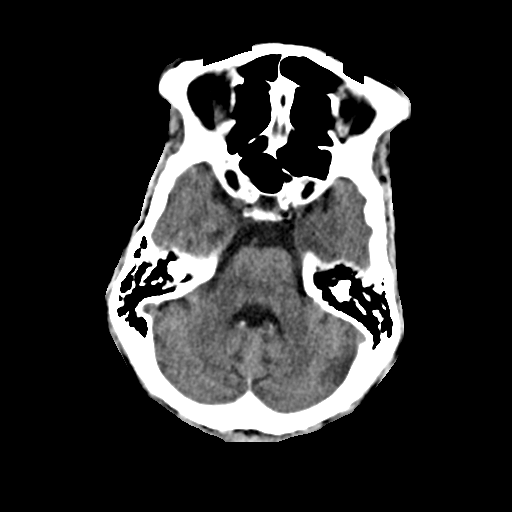
[im 12/31  brain]
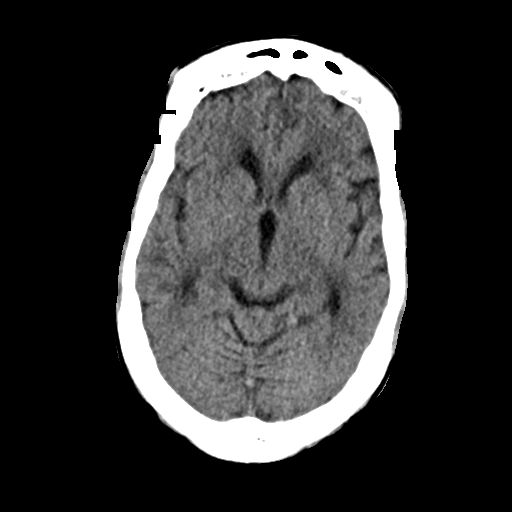
[im 16/31  brain]
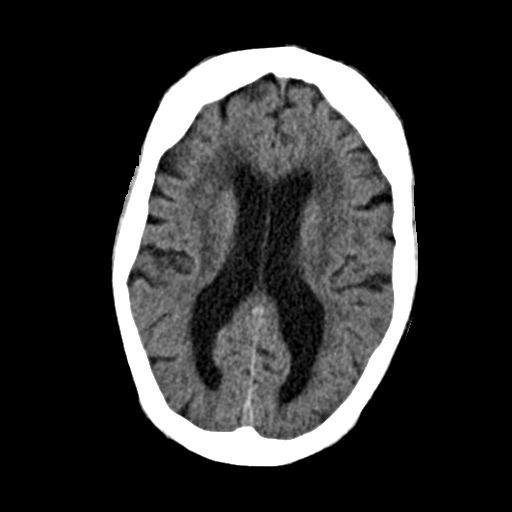
[im 19/31  brain]
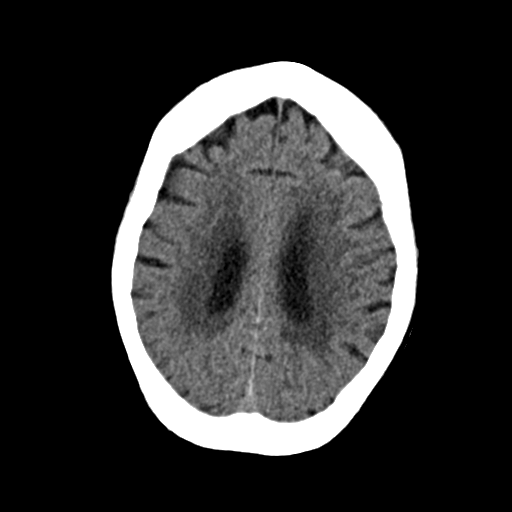
[im 19/31  bone]
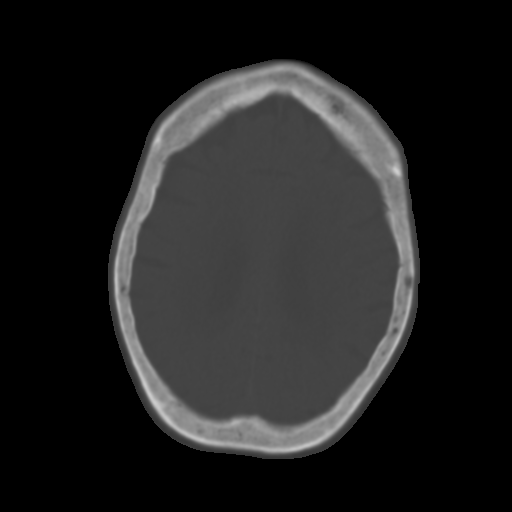
[im 23/31  brain]
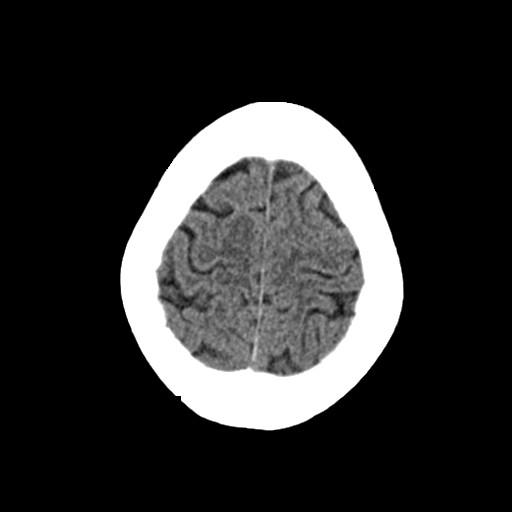
[im 27/31  brain]
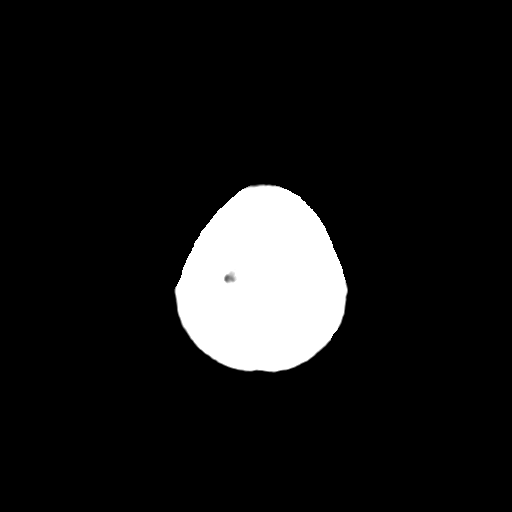

[Series 3: head bone · axial · 0.42mm/px · z∈[-182,-152]mm · 3 of 77 slices shown]
[im 8/77  bone]
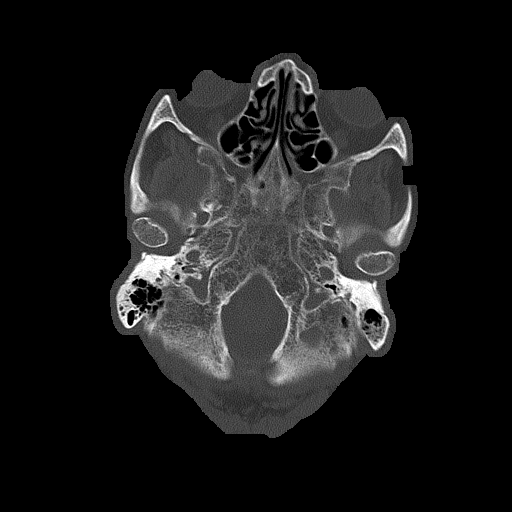
[im 16/77  bone]
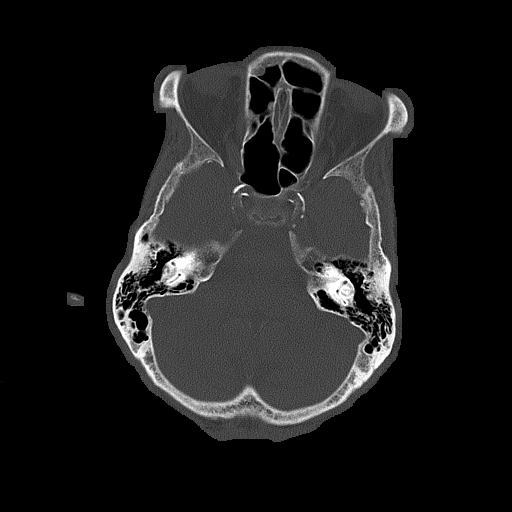
[im 23/77  bone]
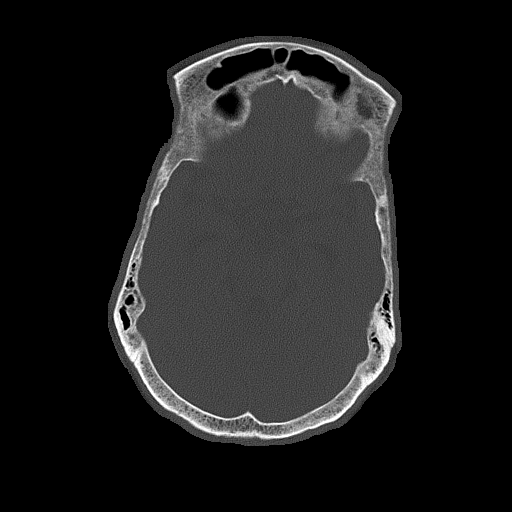

[Series 4: coronal soft · coronal · 0.30mm/px · 3 of 67 slices shown]
[im 23/67  brain]
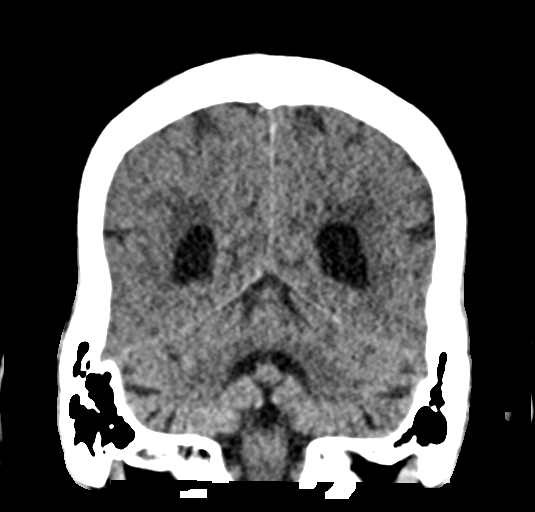
[im 30/67  brain]
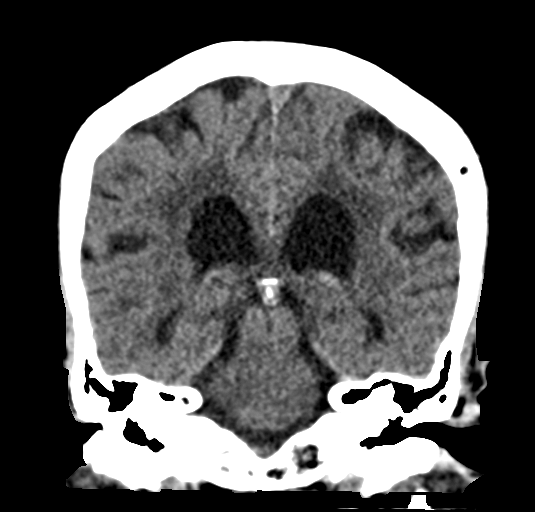
[im 37/67  brain]
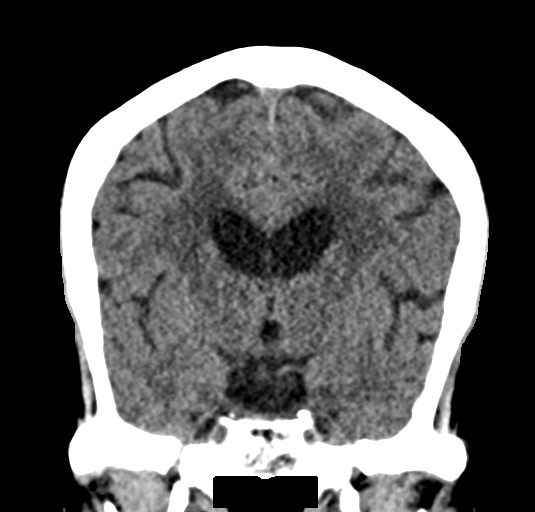

[Series 5: sag soft · sagittal · 0.30mm/px · 3 of 50 slices shown]
[im 17/50  brain]
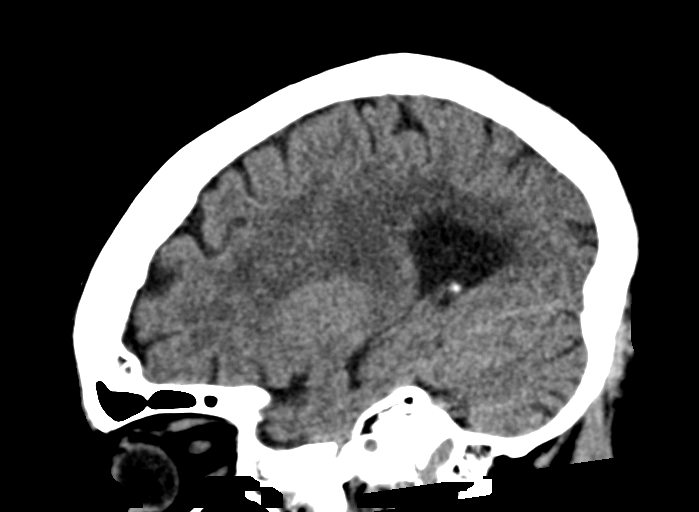
[im 25/50  brain]
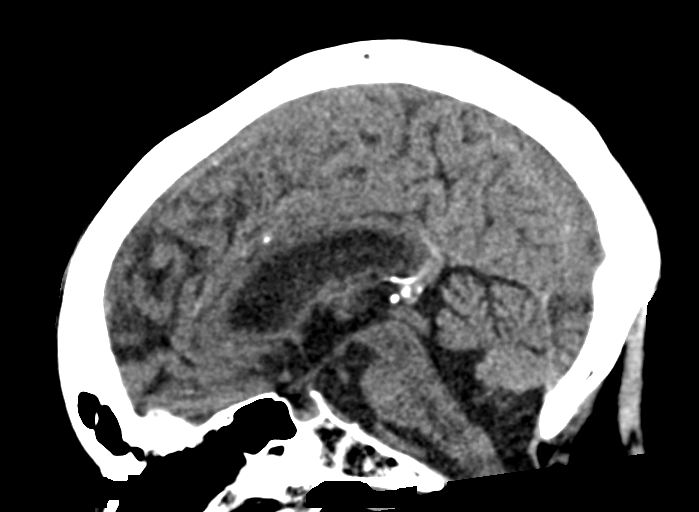
[im 33/50  brain]
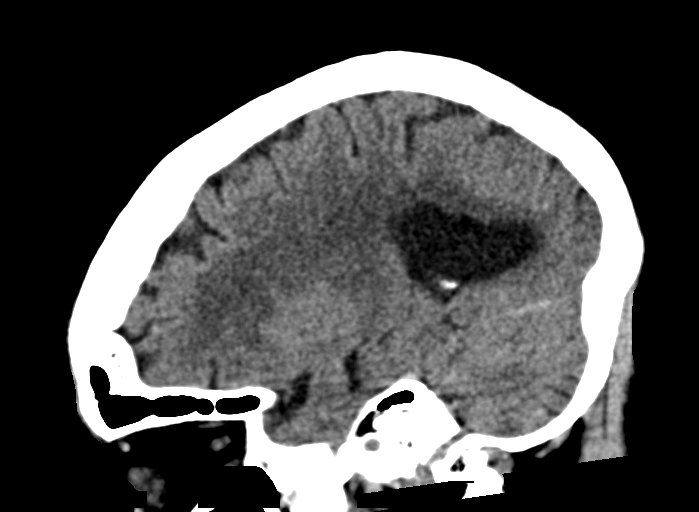

[16 of 47 positions shown; findings below may reference images not displayed]

FINDINGS: Brain: Diffuse cerebral atrophy. Ventricular dilatation consistent
with central atrophy. Low-attenuation changes in the deep white
matter consistent with small vessel ischemia. No abnormal
extra-axial fluid collections. No mass effect or midline shift.
Gray-white matter junctions are distinct. Basal cisterns are not
effaced. No acute intracranial hemorrhage. No focal lesions are
identified that might indicate metastatic disease. However, MRI is
more sensitive for evaluation of metastatic lesions.

Vascular: Intracranial arterial vascular calcifications.

Skull: Calvarium appears intact. No acute depressed skull fractures.
No focal bone lesions.

Sinuses/Orbits: Paranasal sinuses demonstrate opacification of the
sphenoid sinuses. Mastoid air cells are clear.

Other: Incidental note of congenital nonunion of the posterior arch
of C1.
IMPRESSION: 1. No acute intracranial abnormalities. Chronic atrophy and small
vessel ischemic changes.
2. No focal intracranial lesions are demonstrated. However, MRI is
more sensitive for evaluation of intracranial metastatic disease.

## 2021-04-18 NOTE — ED Triage Notes (Signed)
Pt states difficulty walking, legs feel weak and tingling  ?States "when she walks one way she goes the other way"  ?Mass found on left lung, states back pain that started x 1 week ago  ?NAD at this time ?Normal gait without assistance at baseline ?

## 2021-04-18 NOTE — ED Notes (Signed)
Unable to stand and pivot without assistance ?Can perform heel to shin test bilaterally with no concern  ?

## 2021-04-18 NOTE — ED Provider Notes (Signed)
? ?[facility] ?Provider Note: Georgena Spurling, MD, [place] ? ?CSN: [id number] ?MRN: [id number] ?ARRIVAL: 04/18/21 at 2249 ?ROOM: [facility]10/[facility]10 ? ? ?CHIEF COMPLAINT  ?Gait Problem ? ? ?HISTORY OF PRESENT ILLNESS  ?04/18/21 11:19 PM ?Anna Finley is a 76 y.o. female who was recently diagnosed with a right lower lobe lung mass suspicious for primary malignancy.   ? ?She is here with about 2 hours of not being able to stand or ambulate.  She is able to move her legs and feel her legs but when she attempts to stand she does not have the strength to stay up.  When she does try to ambulate she states her legs go in the wrong direction.  She states she is having pain across her upper back and lower back.  This pain has been present for several weeks and she rates it as a 4 out of 10. ? ?Past Medical History:  ?Diagnosis Date  ? Asthma   ? Bronchitis   ? HTN (hypertension)   ? Pneumothorax   ? ? ?Past Surgical History:  ?Procedure Laterality Date  ? HIP ARTHROPLASTY    ? ? ?Family History  ?Problem Relation Age of Onset  ? Cancer Father   ? ? ?Social History  ? ?Tobacco Use  ? Smoking status: Former  ?  Types: Cigarettes  ? Smokeless tobacco: Former  ?  Quit date: 2013  ?Substance Use Topics  ? Alcohol use: Never  ? Drug use: Never  ? ? ?Prior to Admission medications   ?Medication Sig Start Date End Date Taking? Authorizing Provider  ?etodolac (LODINE) 400 MG tablet Take 1 tablet (400 mg total) by mouth every 12 (twelve) hours as needed. 04/14/21 05/14/21  Garner Nash, DO  ? ? ?Allergies ?Patient has no known allergies. ? ? ?REVIEW OF SYSTEMS  ?Negative except as noted here or in the History of Present Illness. ? ? ?PHYSICAL EXAMINATION  ?Initial Vital Signs ?Blood pressure (!) 147/64, pulse 75, temperature 98.2 ?F (36.8 ?C), temperature source Oral, resp. rate 20, height 5\' 5"  (1.651 m), weight 43.1 kg, SpO2 99 %. ? ?Examination ?General: Well-developed, cachectic female in no acute distress; appearance consistent  with age of record ?HENT: normocephalic; atraumatic ?Eyes: pupils equal, round and reactive to light; extraocular muscles intact; arcus senilis bilaterally ?Neck: supple ?Heart: regular rate and rhythm ?Lungs: clear to auscultation bilaterally ?Abdomen: soft; nondistended; nontender; bowel sounds present ?Extremities: Chronic appearing deformities of toes ?Neurologic: Awake, alert and oriented; patient able to lift and move lower extremities symmetrically with intact sensation bilaterally ?Skin: Warm and dry ?Psychiatric: Normal mood and affect ? ? ?RESULTS  ?Summary of this visit's results, reviewed and interpreted by myself: ? ? EKG Interpretation ? ?Date/Time:    ?Ventricular Rate:    ?PR Interval:    ?QRS Duration:   ?QT Interval:    ?QTC Calculation:   ?R Axis:     ?Text Interpretation:   ?  ? ?  ? ?Laboratory Studies: ?Results for orders placed or performed during the hospital encounter of 04/18/21 (from the past 24 hour(s))  ?CBC with Differential/Platelet     Status: Abnormal  ? Collection Time: 04/18/21 11:36 PM  ?Result Value Ref Range  ? WBC 9.7 4.0 - 10.5 K/uL  ? RBC 3.52 (L) 3.87 - 5.11 MIL/uL  ? Hemoglobin 9.9 (L) 12.0 - 15.0 g/dL  ? HCT 29.9 (L) 36.0 - 46.0 %  ? MCV 84.9 80.0 - 100.0 fL  ? MCH 28.1 26.0 -  34.0 pg  ? MCHC 33.1 30.0 - 36.0 g/dL  ? RDW 16.8 (H) 11.5 - 15.5 %  ? Platelets 376 150 - 400 K/uL  ? nRBC 0.0 0.0 - 0.2 %  ? Neutrophils Relative % 66 %  ? Neutro Abs 6.3 1.7 - 7.7 K/uL  ? Lymphocytes Relative 25 %  ? Lymphs Abs 2.4 0.7 - 4.0 K/uL  ? Monocytes Relative 8 %  ? Monocytes Absolute 0.8 0.1 - 1.0 K/uL  ? Eosinophils Relative 1 %  ? Eosinophils Absolute 0.1 0.0 - 0.5 K/uL  ? Basophils Relative 0 %  ? Basophils Absolute 0.0 0.0 - 0.1 K/uL  ? Immature Granulocytes 0 %  ? Abs Immature Granulocytes 0.03 0.00 - 0.07 K/uL  ?Basic metabolic panel     Status: Abnormal  ? Collection Time: 04/18/21 11:36 PM  ?Result Value Ref Range  ? Sodium 134 (L) 135 - 145 mmol/L  ? Potassium 3.4 (L) 3.5 - 5.1  mmol/L  ? Chloride 99 98 - 111 mmol/L  ? CO2 26 22 - 32 mmol/L  ? Glucose, Bld 135 (H) 70 - 99 mg/dL  ? BUN 20 8 - 23 mg/dL  ? Creatinine, Ser 0.91 0.44 - 1.00 mg/dL  ? Calcium 8.8 (L) 8.9 - 10.3 mg/dL  ? GFR, Estimated >60 >60 mL/min  ? Anion gap 9 5 - 15  ? ?Imaging Studies: ?CT Head Wo Contrast ? ?Result Date: 04/19/2021 ?CLINICAL DATA:  Ataxia.  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 EXAM: CT HEAD WITHOUT CONTRAST TECHNIQUE: Contiguous axial images were obtained from the base of the skull through the vertex without intravenous contrast. RADIATION DOSE REDUCTION: This exam was performed according to the departmental dose-optimization program which includes automated exposure control, adjustment of the mA and/or kV according to patient size and/or use of iterative reconstruction technique. COMPARISON:  04/16/2011 FINDINGS: Brain: Diffuse cerebral atrophy. Ventricular dilatation consistent with central atrophy. Low-attenuation changes in the deep white matter consistent with small vessel ischemia. No abnormal extra-axial fluid collections. No mass effect or midline shift. Gray-white matter junctions are distinct. Basal cisterns are not effaced. No acute intracranial hemorrhage. No focal lesions are identified that might indicate metastatic disease. However, MRI is more sensitive for evaluation of metastatic lesions. Vascular: Intracranial arterial vascular calcifications. Skull: Calvarium appears intact. No acute depressed skull fractures. No focal bone lesions. Sinuses/Orbits: Paranasal sinuses demonstrate opacification of the sphenoid sinuses. Mastoid air cells are clear. 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 IMPRESSION: 1. No acute intracranial abnormalities. Chronic atrophy and small vessel ischemic changes. 2. No focal intracranial lesions are demonstrated. However, MRI is more sensitive for evaluation of intracranial metastatic disease. Electronically Signed   By: Lucienne Capers M.D.    On: 04/19/2021 00:04   ? ?ED COURSE and MDM  ?Nursing notes, initial and subsequent vitals signs, including pulse oximetry, reviewed and interpreted by myself. ? ?Vitals:  ? 04/18/21 2258 04/18/21 2301  ?BP:  (!) 147/64  ?Pulse:  75  ?Resp:  20  ?Temp:  98.2 ?F (36.8 ?C)  ?TempSrc:  Oral  ?SpO2: 99% 99%  ?Weight: 43.1 kg   ?Height: 5\' 5"  (1.651 m)   ? ?Medications - No data to display ? ?12:19 AM ?CT scan is unremarkable.  On attempt to ambulate patient with assistance she is able to move only with a slow, shuffling gait that is poorly coordinated.  I am suspicious of metastatic disease to the spine and will transfer the patient to [facility]  ED for emergent MRIs.  Dr. Roxanne Mins accepting for transport. ? ?PROCEDURES  ?Procedures ? ? ?ED DIAGNOSES  ? ?  ICD-10-CM   ?1. Weakness of both lower extremities  R29.898   ?  ? ? ? ?  ?Ledarius Leeson, MD ?04/19/21 0028 ? ?

## 2021-04-19 ENCOUNTER — Emergency Department (HOSPITAL_COMMUNITY): Payer: Medicare Other

## 2021-04-19 ENCOUNTER — Encounter (HOSPITAL_COMMUNITY): Payer: Self-pay | Admitting: Internal Medicine

## 2021-04-19 ENCOUNTER — Ambulatory Visit
Admit: 2021-04-19 | Discharge: 2021-04-19 | Disposition: A | Discharge status: Home / Self Care | Payer: Medicare Other | Attending: Radiation Oncology | Admitting: Radiation Oncology

## 2021-04-19 DIAGNOSIS — J4489 Other specified chronic obstructive pulmonary disease: Secondary | ICD-10-CM | POA: Diagnosis present

## 2021-04-19 DIAGNOSIS — G9529 Other cord compression: Secondary | ICD-10-CM | POA: Diagnosis present

## 2021-04-19 DIAGNOSIS — I10 Essential (primary) hypertension: Secondary | ICD-10-CM | POA: Diagnosis present

## 2021-04-19 DIAGNOSIS — R64 Cachexia: Secondary | ICD-10-CM | POA: Diagnosis present

## 2021-04-19 DIAGNOSIS — G992 Myelopathy in diseases classified elsewhere: Secondary | ICD-10-CM | POA: Diagnosis present

## 2021-04-19 DIAGNOSIS — R918 Other nonspecific abnormal finding of lung field: Secondary | ICD-10-CM | POA: Diagnosis not present

## 2021-04-19 DIAGNOSIS — G9520 Unspecified cord compression: Secondary | ICD-10-CM | POA: Diagnosis present

## 2021-04-19 DIAGNOSIS — I82C11 Acute embolism and thrombosis of right internal jugular vein: Secondary | ICD-10-CM | POA: Diagnosis present

## 2021-04-19 DIAGNOSIS — C771 Secondary and unspecified malignant neoplasm of intrathoracic lymph nodes: Secondary | ICD-10-CM | POA: Diagnosis present

## 2021-04-19 DIAGNOSIS — G893 Neoplasm related pain (acute) (chronic): Secondary | ICD-10-CM | POA: Diagnosis present

## 2021-04-19 DIAGNOSIS — C3431 Malignant neoplasm of lower lobe, right bronchus or lung: Secondary | ICD-10-CM

## 2021-04-19 DIAGNOSIS — M8448XA Pathological fracture, other site, initial encounter for fracture: Secondary | ICD-10-CM | POA: Diagnosis present

## 2021-04-19 DIAGNOSIS — E876 Hypokalemia: Secondary | ICD-10-CM | POA: Diagnosis present

## 2021-04-19 DIAGNOSIS — Z800 Family history of malignant neoplasm of digestive organs: Secondary | ICD-10-CM | POA: Diagnosis not present

## 2021-04-19 DIAGNOSIS — M48061 Spinal stenosis, lumbar region without neurogenic claudication: Secondary | ICD-10-CM | POA: Diagnosis present

## 2021-04-19 DIAGNOSIS — R627 Adult failure to thrive: Secondary | ICD-10-CM | POA: Diagnosis present

## 2021-04-19 DIAGNOSIS — C7801 Secondary malignant neoplasm of right lung: Secondary | ICD-10-CM | POA: Diagnosis present

## 2021-04-19 DIAGNOSIS — E43 Unspecified severe protein-calorie malnutrition: Secondary | ICD-10-CM | POA: Diagnosis present

## 2021-04-19 DIAGNOSIS — R042 Hemoptysis: Secondary | ICD-10-CM | POA: Diagnosis present

## 2021-04-19 DIAGNOSIS — E871 Hypo-osmolality and hyponatremia: Secondary | ICD-10-CM | POA: Diagnosis present

## 2021-04-19 DIAGNOSIS — C7951 Secondary malignant neoplasm of bone: Secondary | ICD-10-CM | POA: Diagnosis present

## 2021-04-19 DIAGNOSIS — J449 Chronic obstructive pulmonary disease, unspecified: Secondary | ICD-10-CM | POA: Diagnosis not present

## 2021-04-19 DIAGNOSIS — Z7189 Other specified counseling: Secondary | ICD-10-CM | POA: Diagnosis not present

## 2021-04-19 DIAGNOSIS — R590 Localized enlarged lymph nodes: Secondary | ICD-10-CM | POA: Diagnosis not present

## 2021-04-19 DIAGNOSIS — R29898 Other symptoms and signs involving the musculoskeletal system: Secondary | ICD-10-CM

## 2021-04-19 DIAGNOSIS — Z87891 Personal history of nicotine dependence: Secondary | ICD-10-CM | POA: Diagnosis not present

## 2021-04-19 DIAGNOSIS — Z791 Long term (current) use of non-steroidal anti-inflammatories (NSAID): Secondary | ICD-10-CM | POA: Diagnosis not present

## 2021-04-19 DIAGNOSIS — J440 Chronic obstructive pulmonary disease with acute lower respiratory infection: Secondary | ICD-10-CM | POA: Diagnosis present

## 2021-04-19 DIAGNOSIS — G43909 Migraine, unspecified, not intractable, without status migrainosus: Secondary | ICD-10-CM | POA: Diagnosis present

## 2021-04-19 DIAGNOSIS — Z823 Family history of stroke: Secondary | ICD-10-CM | POA: Diagnosis not present

## 2021-04-19 DIAGNOSIS — Z681 Body mass index (BMI) 19 or less, adult: Secondary | ICD-10-CM | POA: Diagnosis not present

## 2021-04-19 LAB — BASIC METABOLIC PANEL
Anion gap: 9 (ref 5–15)
BUN: 20 mg/dL (ref 8–23)
CO2: 26 mmol/L (ref 22–32)
Calcium: 8.8 mg/dL — ABNORMAL LOW (ref 8.9–10.3)
Chloride: 99 mmol/L (ref 98–111)
Creatinine, Ser: 0.91 mg/dL (ref 0.44–1.00)
GFR, Estimated: >60 mL/min (ref >60)
Glucose, Bld: 135 mg/dL — ABNORMAL HIGH (ref 70–99)
Potassium: 3.4 mmol/L — ABNORMAL LOW (ref 3.5–5.1)
Sodium: 134 mmol/L — ABNORMAL LOW (ref 135–145)

## 2021-04-19 LAB — HEMOGLOBIN A1C
Hgb A1c MFr Bld: 5.5 % (ref 4.8–5.6)
Mean Plasma Glucose: 111 mg/dL

## 2021-04-19 LAB — GLUCOSE, CAPILLARY: Glucose-Capillary: 97 mg/dL (ref 70–99)

## 2021-04-19 LAB — CBG MONITORING, ED: Glucose-Capillary: 117 mg/dL — ABNORMAL HIGH (ref 70–99)

## 2021-04-19 IMAGING — MR MR CERVICAL SPINE WO/W CM
6 of 9 series · 23 of 48 positions shown · IV contrast (gadavist)
Comparison: Brain MRI today.  Neck CT [DATE].
COMPARISON: Brain MRI today.  Neck CT [DATE].

Addendum:
CLINICAL DATA: 75-year-old female diagnosed last month with
suspicious right lower lobe mass. Ataxia.

EXAM:
MRI CERVICAL SPINE WITHOUT AND WITH CONTRAST
TECHNIQUE: Multiplanar and multiecho pulse sequences of the cervical spine, to
include the craniocervical junction and cervicothoracic junction,
were obtained without and with intravenous contrast.
CONTRAST:  4.5mL GADAVIST GADOBUTROL 1 MMOL/ML IV SOLN

[Series 5: T2 · sagittal · 3.0mm · 0.69mm/px · 2 of 15 slices shown (1 of 2)]
[im 1/15]
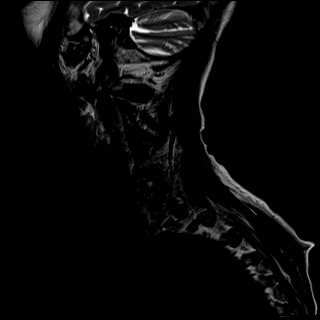
[im 15/15]
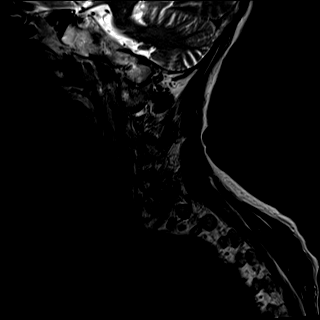

[Series 6: T1 · sagittal · 3.0mm · 0.69mm/px · 2 of 15 slices shown (1 of 2)]
[im 1/15]
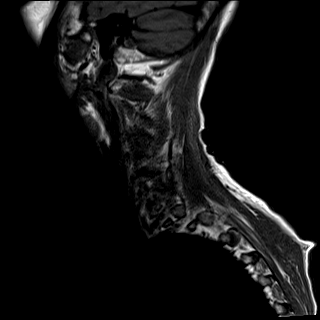
[im 15/15]
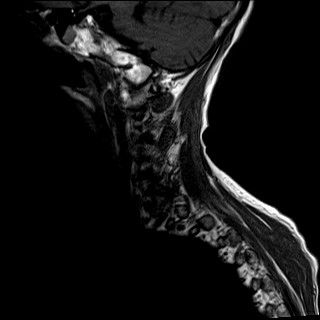

[Series 7: STIR · sagittal · 3.0mm · 0.86mm/px · 1 of 15 slices shown]
[im 1/15]
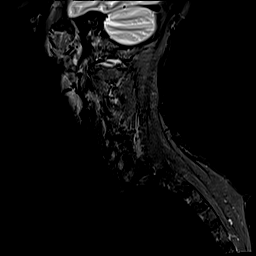

[Series 17: T2 · axial · 3.0mm · 0.66mm/px · z∈[-65,+57]mm · 8 of 40 slices shown (2 of 2)]
[im 1/40]
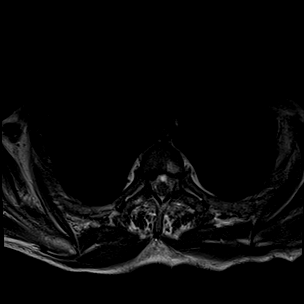
[im 6/40]
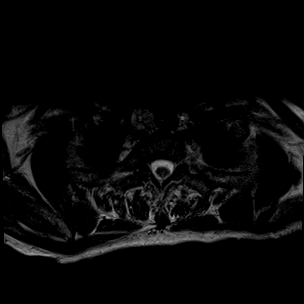
[im 12/40]
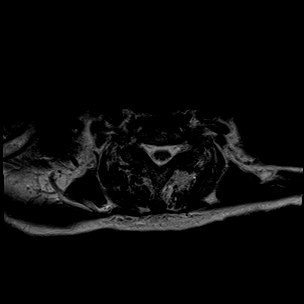
[im 17/40]
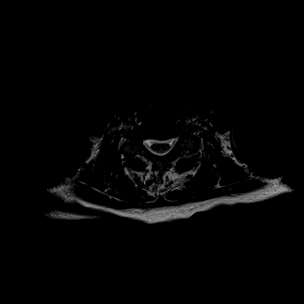
[im 23/40]
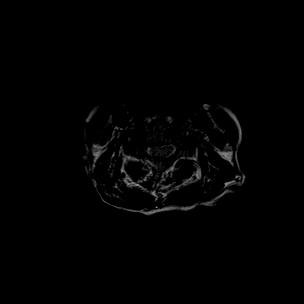
[im 28/40]
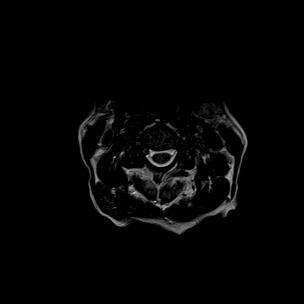
[im 34/40]
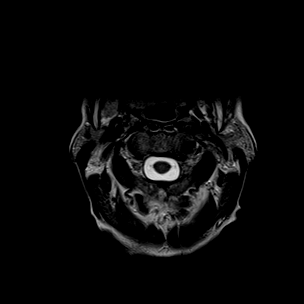
[im 40/40]
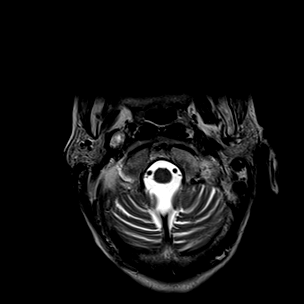

[Series 18: T1 · axial · 3.0mm · 0.39mm/px · z∈[-65,+57]mm · 7 of 37 slices shown (2 of 2)]
[im 1/37]
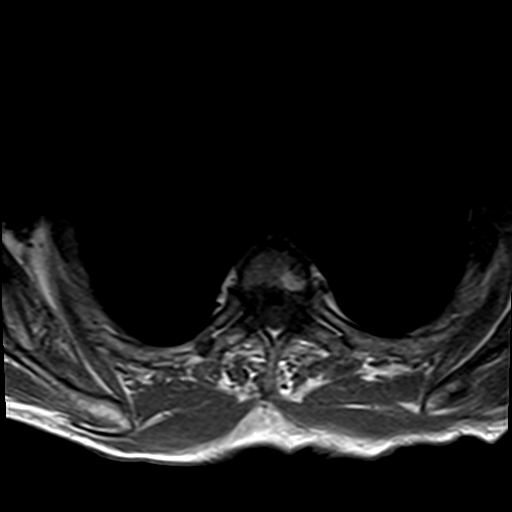
[im 7/37]
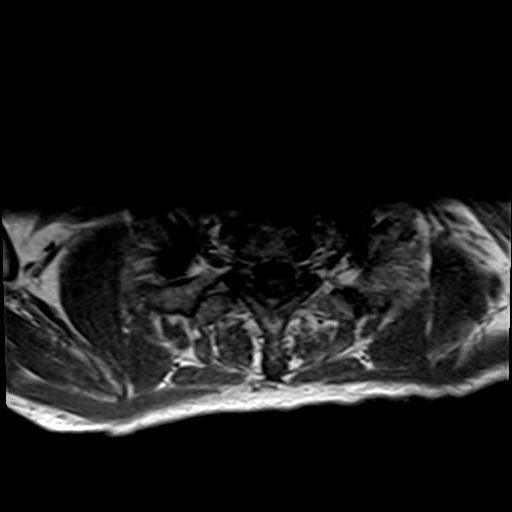
[im 13/37]
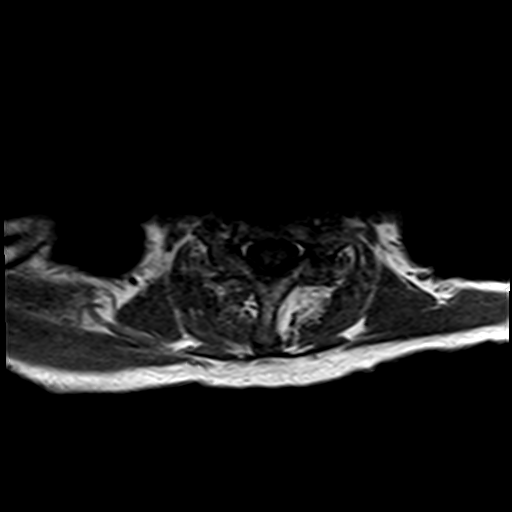
[im 19/37]
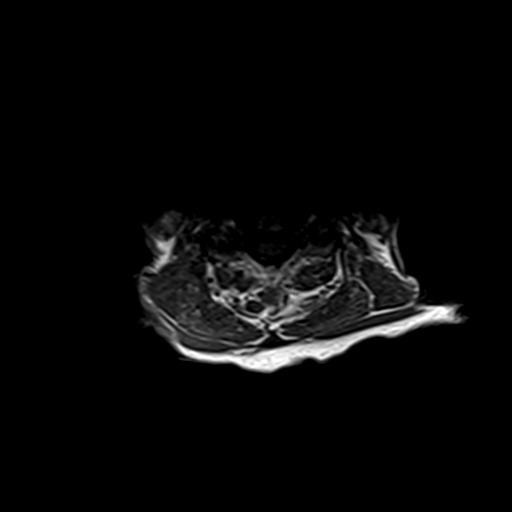
[im 25/37]
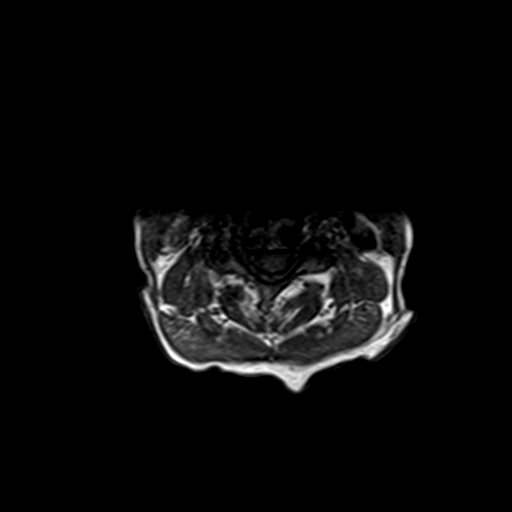
[im 31/37]
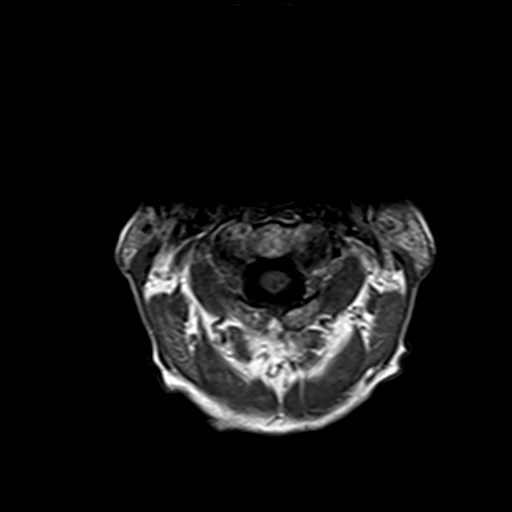
[im 37/37]
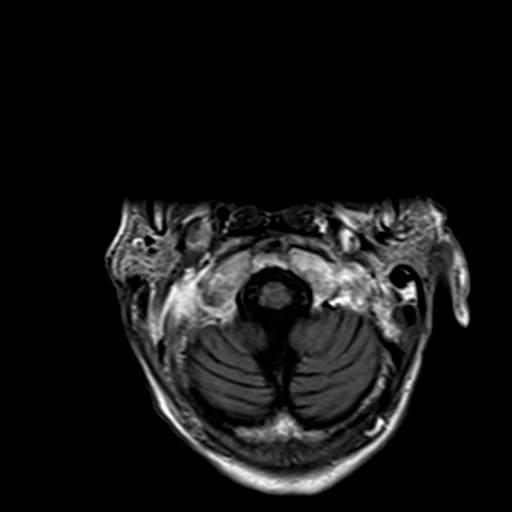

[Series 20: T1 fat-sat post-contrast · sagittal · 3.0mm · 0.43mm/px · 3 of 15 slices shown]
[im 1/15]
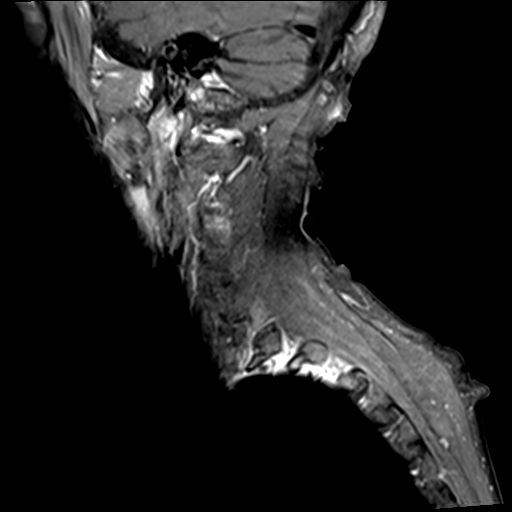
[im 8/15]
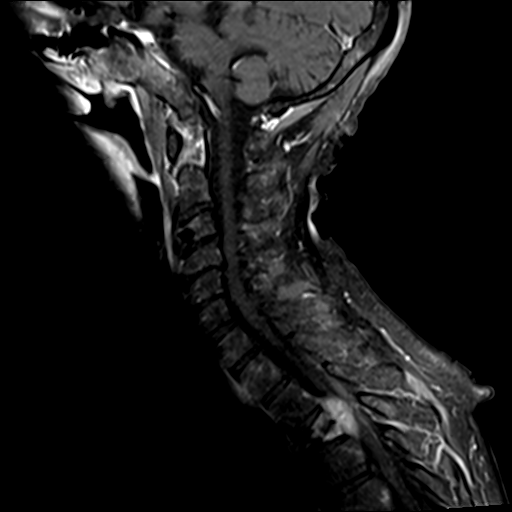
[im 15/15]
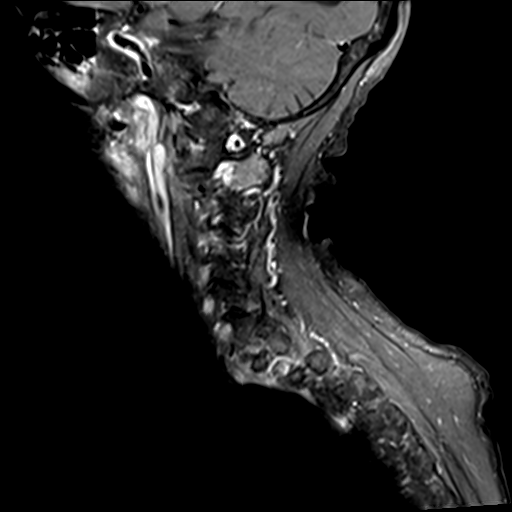

[23 of 48 positions shown; findings below may reference images not displayed]

FINDINGS: Alignment: Stable cervical vertebral height and alignment since
[AZ], mild straightening and reversal of upper cervical lordosis.

Vertebrae: Mildly heterogeneous bone marrow signal in the cervical
spine.

Focal abnormal marrow edema and enhancement of the right C4 facet is
suspicious for metastatic disease to bone, and the signal
abnormality there appears to track along the right lamina as well
(series 19, image 17). Otherwise degenerative marrow signal changes
in the cervical spine including probable gas containing geode of the
anterior C3 body which lacks enhancement.

Cord: No cervical abnormal intradural enhancement or dural
thickening. No cervical cord edema. And no cord signal changes
despite multilevel degenerative spinal stenosis described below.

Posterior Fossa, vertebral arteries, paraspinal tissues:
Cervicomedullary junction is within normal limits. Brain is reported
separately. Major vascular flow voids in the neck are preserved, and
on these images the abnormal 14 mm T2 heterogeneous and enhancing
lesion below the right skull base appears more extrinsic to the
jugular vein (series 17, image 1). This might be a nerve sheath
tumor.

No other neck mass or cervical lymphadenopathy identified. Negative
lung apices.

Disc levels:

Cervical spine degeneration with mild multifactorial spinal stenosis
C3-C4, C4-C5, C5-C6. This is related to disc, endplate, and
posterior element degeneration. Mild associated spinal cord mass
effect but no cord signal abnormality. Moderate to severe bilateral
neural foraminal stenosis at those levels.

Other findings: Pathologic T3 compression fracture with retropulsion
and extraosseous tumor affecting the spinal canal there. Dedicated
thoracic spine CT is pending (series 7, image 9 of this exam).
IMPRESSION: 1. Pathologic T3 compression fracture with extraosseous and spinal
canal tumor. See dedicated Thoracic spine MRI which is pending at
this time.

2. Evidence of isolated osseous metastatic disease in the cervical
spine at the right C4 facet and lamina.

3. But no malignant cervical spinal stenosis. Degenerative spinal
stenosis C3-C4 through C5-C6 with no cord edema or myelomalacia.

4. Partially visible right upper neck T2 hyperintense and enhancing
soft tissue mass as seen on the brain MRI this morning. These images
suggest a Carotid Space Nerve Sheath Tumor may be the top
differential consideration. Follow-up Neck CT would still be
valuable.

ADDENDUM:
Per radiology technologist, patient in pain and unable to tolerate
the additional planned Thoracic and lumbar MRI. Those are being
deferred until later today at this time.

T3 vertebral marrow replacement by tumor, especially throughout the
vertebral body, pedicles, and left posterior elements. Pathologic
compression fracture with 50% loss of height. Mild retropulsion of
bone, but bulky enhancing tumor (visible only on sagittal images of
this exam) extends laterally and cephalad from the T3 level with
spinal cord compression. Mild cord edema is possible. Bilateral T3
neural foramina are filled with tumor. And there is significant
encroachment on the left T2 neural foramen also. There is associated
abnormal dorsal dural thickening.
CONCLUSION: 1. Patient unable to continue at this time with planned Thoracic
Spine MRI, now being deferred until later today
2. Severe pathologic compression fracture of T3 with bulky enhancing
tumor in the upper thoracic spinal canal and SPINAL Cord
Compression. Possible mild T3 level cord edema (no axial images
through that level on this exam). Tumor obliterating the bilateral
T3 and left T2 neural foramina.

This was discussed by telephone with PA GUDRUN on [DATE]
at [AZ] hours.

*** End of Addendum ***
FINDINGS: Alignment: Stable cervical vertebral height and alignment since
[AZ], mild straightening and reversal of upper cervical lordosis.

Vertebrae: Mildly heterogeneous bone marrow signal in the cervical
spine.

Focal abnormal marrow edema and enhancement of the right C4 facet is
suspicious for metastatic disease to bone, and the signal
abnormality there appears to track along the right lamina as well
(series 19, image 17). Otherwise degenerative marrow signal changes
in the cervical spine including probable gas containing geode of the
anterior C3 body which lacks enhancement.

Cord: No cervical abnormal intradural enhancement or dural
thickening. No cervical cord edema. And no cord signal changes
despite multilevel degenerative spinal stenosis described below.

Posterior Fossa, vertebral arteries, paraspinal tissues:
Cervicomedullary junction is within normal limits. Brain is reported
separately. Major vascular flow voids in the neck are preserved, and
on these images the abnormal 14 mm T2 heterogeneous and enhancing
lesion below the right skull base appears more extrinsic to the
jugular vein (series 17, image 1). This might be a nerve sheath
tumor.

No other neck mass or cervical lymphadenopathy identified. Negative
lung apices.

Disc levels:

Cervical spine degeneration with mild multifactorial spinal stenosis
C3-C4, C4-C5, C5-C6. This is related to disc, endplate, and
posterior element degeneration. Mild associated spinal cord mass
effect but no cord signal abnormality. Moderate to severe bilateral
neural foraminal stenosis at those levels.

Other findings: Pathologic T3 compression fracture with retropulsion
and extraosseous tumor affecting the spinal canal there. Dedicated
thoracic spine CT is pending (series 7, image 9 of this exam).
IMPRESSION: 1. Pathologic T3 compression fracture with extraosseous and spinal
canal tumor. See dedicated Thoracic spine MRI which is pending at
this time.

2. Evidence of isolated osseous metastatic disease in the cervical
spine at the right C4 facet and lamina.

3. But no malignant cervical spinal stenosis. Degenerative spinal
stenosis C3-C4 through C5-C6 with no cord edema or myelomalacia.

4. Partially visible right upper neck T2 hyperintense and enhancing
soft tissue mass as seen on the brain MRI this morning. These images
suggest a Carotid Space Nerve Sheath Tumor may be the top
differential consideration. Follow-up Neck CT would still be
valuable.

## 2021-04-19 MED ORDER — LACTATED RINGERS IV SOLN: INTRAVENOUS | Status: DC

## 2021-04-19 MED ORDER — INSULIN ASPART 100 UNIT/ML IJ SOLN: 0.0000 [IU] | Freq: Every day | INTRAMUSCULAR | Status: DC

## 2021-04-19 MED ORDER — OXYCODONE HCL 5 MG PO TABS
5.0000 mg | ORAL_TABLET | ORAL | Status: DC | PRN
  Administered 2021-04-19 – 2021-04-27 (×8): 5 mg via ORAL
  Filled 2021-04-19 (×8): qty 1

## 2021-04-19 MED ORDER — ALBUTEROL SULFATE (2.5 MG/3ML) 0.083% IN NEBU: 2.5000 mg | INHALATION_SOLUTION | RESPIRATORY_TRACT | Status: DC | PRN

## 2021-04-19 MED ORDER — ONDANSETRON 4 MG PO TBDP
4.0000 mg | ORAL_TABLET | Freq: Once | ORAL | Status: AC
  Administered 2021-04-19: 4 mg via ORAL
  Filled 2021-04-19: qty 1

## 2021-04-19 MED ORDER — ALUM & MAG HYDROXIDE-SIMETH 200-200-20 MG/5ML PO SUSP
30.0000 mL | ORAL | Status: DC | PRN
  Administered 2021-04-19 – 2021-04-27 (×4): 30 mL via ORAL
  Filled 2021-04-19 (×4): qty 30

## 2021-04-19 MED ORDER — MONTELUKAST SODIUM 10 MG PO TABS
10.0000 mg | ORAL_TABLET | Freq: Every day | ORAL | Status: DC
  Administered 2021-04-19 – 2021-04-26 (×8): 10 mg via ORAL
  Filled 2021-04-19 (×9): qty 1

## 2021-04-19 MED ORDER — DOCUSATE SODIUM 100 MG PO CAPS
100.0000 mg | ORAL_CAPSULE | Freq: Two times a day (BID) | ORAL | Status: DC
  Administered 2021-04-19 – 2021-04-27 (×15): 100 mg via ORAL
  Filled 2021-04-19 (×16): qty 1

## 2021-04-19 MED ORDER — MOMETASONE FURO-FORMOTEROL FUM 200-5 MCG/ACT IN AERO
2.0000 | INHALATION_SPRAY | Freq: Two times a day (BID) | RESPIRATORY_TRACT | Status: DC
Start: 2021-04-19 — End: 2021-04-28
  Administered 2021-04-20 – 2021-04-27 (×15): 2 via RESPIRATORY_TRACT
  Filled 2021-04-19 (×2): qty 8.8

## 2021-04-19 MED ORDER — INSULIN ASPART 100 UNIT/ML IJ SOLN
0.0000 [IU] | Freq: Three times a day (TID) | INTRAMUSCULAR | Status: DC
  Administered 2021-04-20: 3 [IU] via SUBCUTANEOUS
  Administered 2021-04-20: 1 [IU] via SUBCUTANEOUS
  Administered 2021-04-21: 2 [IU] via SUBCUTANEOUS

## 2021-04-19 MED ORDER — ATORVASTATIN CALCIUM 40 MG PO TABS
40.0000 mg | ORAL_TABLET | Freq: Every day | ORAL | Status: DC
  Administered 2021-04-19 – 2021-04-27 (×8): 40 mg via ORAL
  Filled 2021-04-19 (×8): qty 1

## 2021-04-19 MED ORDER — CALCIUM CARBONATE ANTACID 500 MG PO CHEW
1.0000 | CHEWABLE_TABLET | Freq: Once | ORAL | Status: AC
  Administered 2021-04-19: 200 mg via ORAL
  Filled 2021-04-19: qty 1

## 2021-04-19 MED ORDER — POLYETHYLENE GLYCOL 3350 17 G PO PACK
17.0000 g | PACK | Freq: Every day | ORAL | Status: DC | PRN
  Administered 2021-04-25: 17 g via ORAL
  Filled 2021-04-19: qty 1

## 2021-04-19 MED ORDER — GADOBUTROL 1 MMOL/ML IV SOLN
4.5000 mL | Freq: Once | INTRAVENOUS | Status: AC | PRN
  Administered 2021-04-19: 4.5 mL via INTRAVENOUS

## 2021-04-19 MED ORDER — ONDANSETRON HCL 4 MG/2ML IJ SOLN
4.0000 mg | Freq: Four times a day (QID) | INTRAMUSCULAR | Status: DC | PRN
Start: 2021-04-19 — End: 2021-04-28

## 2021-04-19 MED ORDER — MORPHINE SULFATE (PF) 2 MG/ML IV SOLN
2.0000 mg | INTRAVENOUS | Status: DC | PRN
  Administered 2021-04-20: 2 mg via INTRAVENOUS
  Filled 2021-04-19: qty 1

## 2021-04-19 MED ORDER — BISACODYL 5 MG PO TBEC
5.0000 mg | DELAYED_RELEASE_TABLET | Freq: Every day | ORAL | Status: DC | PRN
  Administered 2021-04-24 – 2021-04-26 (×2): 5 mg via ORAL
  Filled 2021-04-19 (×2): qty 1

## 2021-04-19 MED ORDER — FENTANYL CITRATE PF 50 MCG/ML IJ SOSY
50.0000 ug | PREFILLED_SYRINGE | Freq: Once | INTRAMUSCULAR | Status: AC
Start: 2021-04-19 — End: 2021-04-19
  Administered 2021-04-19: 50 ug via INTRAVENOUS
  Filled 2021-04-19: qty 1

## 2021-04-19 MED ORDER — DEXAMETHASONE 4 MG PO TABS
4.0000 mg | ORAL_TABLET | Freq: Three times a day (TID) | ORAL | Status: DC
  Administered 2021-04-19 – 2021-04-27 (×23): 4 mg via ORAL
  Filled 2021-04-19 (×23): qty 1

## 2021-04-19 MED ORDER — HYDRALAZINE HCL 20 MG/ML IJ SOLN: 5.0000 mg | INTRAMUSCULAR | Status: DC | PRN

## 2021-04-19 MED ORDER — ONDANSETRON HCL 4 MG PO TABS
4.0000 mg | ORAL_TABLET | Freq: Four times a day (QID) | ORAL | Status: DC | PRN
Start: 2021-04-19 — End: 2021-04-28

## 2021-04-19 MED ORDER — ACETAMINOPHEN 325 MG PO TABS
650.0000 mg | ORAL_TABLET | Freq: Four times a day (QID) | ORAL | Status: DC | PRN
  Administered 2021-04-20 – 2021-04-25 (×7): 650 mg via ORAL
  Filled 2021-04-19 (×7): qty 2

## 2021-04-19 MED ORDER — GABAPENTIN 300 MG PO CAPS
300.0000 mg | ORAL_CAPSULE | Freq: Two times a day (BID) | ORAL | Status: DC
  Administered 2021-04-19 – 2021-04-27 (×16): 300 mg via ORAL
  Filled 2021-04-19 (×16): qty 1

## 2021-04-19 MED ORDER — ACETAMINOPHEN 650 MG RE SUPP: 650.0000 mg | Freq: Four times a day (QID) | RECTAL | Status: DC | PRN

## 2021-04-19 NOTE — ED Notes (Signed)
Pt back in bed, call light within reach, no further needs at this time ?

## 2021-04-19 NOTE — Assessment & Plan Note (Addendum)
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 Julien Nordmann planning to follow outpatient after PET scan in clinic ?Appreciate pulmonology ?

## 2021-04-19 NOTE — ED Notes (Signed)
Pt denies any pain at this time. Report given to medical transport team, paperwork sent with medical transport team ?

## 2021-04-19 NOTE — Assessment & Plan Note (Addendum)
Atenolol ?Stop chlorthalidone given hyponatremia ?

## 2021-04-19 NOTE — Assessment & Plan Note (Addendum)
Albuterol, dulera, singulair  ?

## 2021-04-19 NOTE — ED Provider Notes (Signed)
Patient transferred from [facility] for MRIs due to bilateral lower extremity weakness that started last night at 8pm.  CT head was negative at [facility]. ? ?I discussed the case with Dr. Roxanne Mins, who agrees with plan for MRIs of brain and spine.  ? ? ?I discussed the case with Dr. Marlowe Sax, who will admit the patient (patient to be seen by [facility] day team). ? ?I discussed the case with neurosurgery APP Maudie Mercury, who will consult with neurosurgery and provide recs. ?  ?Montine Circle, PA-C ?04/19/21 3462 ? ?  ?Delora Fuel, MD ?19/47/12 [phone] ? ?

## 2021-04-19 NOTE — ED Notes (Signed)
Report handed off to Production assistant, radio.  ?

## 2021-04-19 NOTE — Progress Notes (Signed)
I noticed pt did not have steroids ordered, new order placed for Dexamethasone 4 mg TID which will continue until completion of radiotherapy and we will then recommend her taper instructions.  ? ? ? ?Carola Rhine, PAC  ?

## 2021-04-19 NOTE — Progress Notes (Signed)
Brain and cervical with and without have been completed. Pt was in too much pain to complete all four exams at once. Contrast was given at 0503. After 12 hrs pt can return to MRI for the T&L spine with and without exams.  ?

## 2021-04-19 NOTE — ED Notes (Signed)
Called report to Environmental manager at [employer] 6th floor ?

## 2021-04-19 NOTE — Assessment & Plan Note (Addendum)
Patient presenting with B leg weakness, dfficulty ambulating ?MRI C spine with pathologic compression fx of T3 with bulky enhancing tumor in the upper thoracic spinal cord canal and spinal cord compression, possible mild T3 level cord edema.  Tumor obliterating bilateral T3 and L T2 neural foramina. ?MRI T/L spine with metastatic deposit in T3 verebra with epidural tumor compressing the cord and infiltrating the L V4-9 foramen, uncomplicated metastatic disease at T9, T12, L4, and sacrum.  Degenerative spinal stenosis at L4-5, degenerative bilateral foraminal impingement at L4-5 and L5-S1. ?MRI brain with no intracranial metastatic disease or acute intracranial abnormality ?Neurosurgery recommending proceed with radiation if radiosensitive tumor, could consider cytoreductive surgery if not radiosensitive ?Rad onc planning for 10 fractions of radiotherapy to thoracic spine (started 4/5) ?Dexamethasone discharge with 4 mg TID, taper per rad onc outpatient ?Oncology planning to follow up outpatient after PET scan ?After review with CIR admission coordinator, not thought good candidate for CIR (per discussion with OT, fatigued easily yesterday.  Also, she hasn't walked in [place] few days).  Will plan for home with home health per family request.  ?

## 2021-04-19 NOTE — ED Notes (Signed)
Pt set up with breakfast tray and used bedpan ?

## 2021-04-19 NOTE — Consult Note (Addendum)
Reason for Consult:mets to the spine ?Referring Physician: edp ? ?Anna Finley is an 76 y.o. female.  ? ?HPI:  ?76 year old female presented to the ED last night after lower extremity weakness. States that she felt she was walking sideways for "awhile" and could not walk at all yesterday. She was just recently diagnosed with lung cancer. Has not seen oncology yet but was set for lung biopsy on April 13th according to her granddaughter who takes care of her. Patient lives at home with her daughter. Reports back pain but no radicular pain numbness tingling or weakness. Denies any change in her bowel or bladder habits.  ? ?Past Medical History:  ?Diagnosis Date  ? COPD (chronic obstructive pulmonary disease) with chronic bronchitis ([place])   ? HTN (hypertension)   ? Pneumothorax   ?  ?Past Surgical History:  ?Procedure Laterality Date  ? HIP ARTHROPLASTY    ?  ?No Known Allergies  ?Social History  ? ?Tobacco Use  ? Smoking status: Former  ?  Packs/day: 0.50  ?  Years: 53.00  ?  Pack years: 26.50  ?  Types: Cigarettes  ?  Quit date: 2013  ?  Years since quitting: 10.2  ? Smokeless tobacco: Former  ?  Quit date: 2013  ?Substance Use Topics  ? Alcohol use: Not Currently  ?  Comment: h/o heavy use - quit in 2013  ?  ?Family History  ?Problem Relation Age of Onset  ? Cancer Father 37  ?     stomach  ? ?  ?Review of Systems ? ?Positive ROS: as above ? ?All other systems have been reviewed and were otherwise negative with the exception of those mentioned in the HPI and as above. ? ?Objective: ?Vital signs in last 24 hours: ?Temp:  [98.1 ?F (36.7 ?C)-98.2 ?F (36.8 ?C)] 98.1 ?F (36.7 ?C) (04/03 0230) ?Pulse Rate:  [58-77] 68 (04/03 0745) ?Resp:  [14-20] 16 (04/03 0745) ?BP: (112-160)/(58-92) 112/61 (04/03 0745) ?SpO2:  [97 %-100 %] 100 % (04/03 0745) ?Weight:  [43.1 kg] 43.1 kg (04/02 2258) ? ?General Appearance: Alert, cooperative, no distress, appears stated age ?Head: Normocephalic, without obvious abnormality,  atraumatic ?Eyes: PERRL, conjunctiva/corneas clear, EOM's intact, fundi benign, both eyes      ?Lungs: respirations unlabored ?Heart: Regular rate and rhythm ?Extremities: Extremities normal, atraumatic, no cyanosis or edema ?Pulses: 2+ and symmetric all extremities ?Skin: Skin color, texture, turgor normal, no rashes or lesions ? ?NEUROLOGIC:  ? ?Mental status: A&O x4, no aphasia, good attention span, Memory and fund of knowledge ?Motor Exam - grossly normal, normal tone and bulk ?Sensory Exam - grossly normal ?Reflexes: symmetric, no pathologic reflexes, No Hoffman's, No clonus ?Coordination - grossly normal ?Gait - not tested ?Balance - not tested ?Cranial Nerves: ?I: smell Not tested  ?II: visual acuity  OS: na    OD: na  ?II: visual fields Full to confrontation  ?II: pupils Equal, round, reactive to light  ?III,VII: ptosis None  ?III,IV,VI: extraocular muscles    ?V: mastication   ?V: facial light touch sensation    ?V,VII: corneal reflex    ?VII: facial muscle function - upper    ?VII: facial muscle function - lower   ?VIII: hearing   ?IX: soft palate elevation    ?IX,X: gag reflex   ?XI: trapezius strength    ?XI: sternocleidomastoid strength   ?XI: neck flexion strength    ?XII: tongue strength    ? ? ?Data Review ?Lab Results  ?Component Value  Date  ? WBC 9.7 04/18/2021  ? HGB 9.9 (L) 04/18/2021  ? HCT 29.9 (L) 04/18/2021  ? MCV 84.9 04/18/2021  ? PLT 376 04/18/2021  ? ?Lab Results  ?Component Value Date  ? NA 134 (L) 04/18/2021  ? K 3.4 (L) 04/18/2021  ? CL 99 04/18/2021  ? CO2 26 04/18/2021  ? BUN 20 04/18/2021  ? CREATININE 0.91 04/18/2021  ? GLUCOSE 135 (H) 04/18/2021  ? ?No results found for: INR, PROTIME ? ?Radiology: CT Head Wo Contrast ? ?Result Date: 04/19/2021 ?CLINICAL DATA:  Ataxia.  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 EXAM: CT HEAD WITHOUT CONTRAST TECHNIQUE: Contiguous axial images were obtained from the base of the skull through the vertex without intravenous contrast. RADIATION DOSE REDUCTION: This  exam was performed according to the departmental dose-optimization program which includes automated exposure control, adjustment of the mA and/or kV according to patient size and/or use of iterative reconstruction technique. COMPARISON:  04/16/2011 FINDINGS: Brain: Diffuse cerebral atrophy. Ventricular dilatation consistent with central atrophy. Low-attenuation changes in the deep white matter consistent with small vessel ischemia. No abnormal extra-axial fluid collections. No mass effect or midline shift. Gray-white matter junctions are distinct. Basal cisterns are not effaced. No acute intracranial hemorrhage. No focal lesions are identified that might indicate metastatic disease. However, MRI is more sensitive for evaluation of metastatic lesions. Vascular: Intracranial arterial vascular calcifications. Skull: Calvarium appears intact. No acute depressed skull fractures. No focal bone lesions. Sinuses/Orbits: Paranasal sinuses demonstrate opacification of the sphenoid sinuses. Mastoid air cells are clear. 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 IMPRESSION: 1. No acute intracranial abnormalities. Chronic atrophy and small vessel ischemic changes. 2. No focal intracranial lesions are demonstrated. However, MRI is more sensitive for evaluation of intracranial metastatic disease. Electronically Signed   By: Lucienne Capers M.D.   On: 04/19/2021 00:04  ? ?MR BRAIN W WO CONTRAST ? ?Result Date: 04/19/2021 ?CLINICAL DATA:  76 year old female diagnosed last month with suspicious right lower lobe mass. Ataxia. EXAM: MRI HEAD WITHOUT AND WITH CONTRAST TECHNIQUE: Multiplanar, multiecho pulse sequences of the brain and surrounding structures were obtained without and with intravenous contrast. CONTRAST:  4.29mL GADAVIST GADOBUTROL 1 MMOL/ML IV SOLN COMPARISON:  Head CT without contrast yesterday. [facility] Neck CT 04/19/2017. FINDINGS: Brain: No  restricted diffusion to suggest acute infarction. No midline shift, mass effect, evidence of mass lesion, ventriculomegaly, extra-axial collection or acute intracranial hemorrhage. Cervicomedullary junction and pituitary are within normal limits. No abnormal intracranial enhancement or dural thickening identified. Patchy and confluent widespread bilateral cerebral white matter T2 and FLAIR hyperintensity. Moderate similar T2 heterogeneity throughout the bilateral deep gray matter nuclei. Comparatively mild T2 heterogeneity in the pons. Chronic microhemorrhage in the right thalamus. No cortical encephalomalacia identified. Vascular: Major intracranial vascular flow voids are preserved. The major dural venous sinuses inside the skull are enhancing and appear to be patent. However, the right IJ below the skull base appears abnormal (series 19, image 2), and might either be thrombosed in the upper neck, or there could instead be a right carotid space abnormal 14 mm soft tissue mass or adjacent malignant lymph node (series 25, image 16 and series 38, image 7). The right carotid space in that region was normal on the 2019 neck CT. Skull and upper cervical spine: Hyperostosis of the calvarium. Mildly heterogeneous marrow signal in the skull and visible cervical spine, but no destructive osseous lesion identified. Sinuses/Orbits: Paranasal sinuses and mastoids are stable  and well aerated. Negative orbits. Other: Visible internal auditory structures appear normal. Outside of the right upper neck at the jugular vein below the skull base the visible scalp and face soft tissues appear negative. IMPRESSION: 1. No intracranial metastatic disease or acute intracranial abnormality identified. Moderately advanced signal changes in the brain compatible with chronic small vessel disease. 2. But abnormal right neck soft tissues in the right carotid space, indeterminate for a 14 mm segment of right jugular vein thrombosis, versus right  carotid tumor or malignant lymph node. This is new from a Neck CT 04/19/2017. And a repeat Neck CT with IV contrast is recommended to better characterize. Electronically Signed   By: Genevie Ann M.D.   On: 04/19/2021 05:51

## 2021-04-19 NOTE — Plan of Care (Signed)

## 2021-04-19 NOTE — H&P (Signed)
?History and Physical  ? ? ?Patient: Anna Finley FTD:[id number] DOB: 01-Feb-1945 ?DOA: 04/18/2021 ?DOS: the patient was seen and examined on 04/19/2021 ?PCP: [name], MD  ?Patient coming from: Home - lives with daughter and grandchildren; [place]: Daughter, [phone] ? ? ?Chief Complaint: Weakness ? ?HPI: Anna Finley is a 76 y.o. female with medical history significant of HTN, COPD, and recent diagnosis of lung cancer - has not yet had biopsy but was seen by Dr. Valeta Harms on 3/29 in clinic with plan for PET scans.  She noticed shoulder pain and she wasn't able to walk.  She noticed intermittent L upper back/shoulder pain for 2 weeks.  When she coughs she has sharp pains across her back.  The walking issue started late yesterday.  She has chronic knee pain and her knees just wouldn't work to support her.  She was complaining about the back pain and her PCP did a CT scan with a 6cm R lung mass with post-obstructive PNA.  She is supposed to have a PET scan on 4/13 and then a biopsy when she comes back to pulm on 4/17.  Cough is worsening, +hemoptysis for about a month or two.  +20 pound weight loss since 2021.  No night sweats. ? ? ? ?ER Course:  Carryover, per Dr. Marlowe Sax: ? ?76 year old with recently diagnosed lung mass on CT done 3/10 (Care Everywhere) presenting with bilateral lower extremity weakness and inability to walk.  MRI brain negative for signs of metastasis however MRI of C-spine did show evidence of spinal metastasis and T3 compression fracture.  Also showing findings concerning for right jugular vein thrombosis versus carotid tumor versus malignant lymph node.  MRI of lumbar and thoracic spine ordered but cannot be done for another 12 hours as patient received contrast for the other MRIs.  Neurosurgery will consult.  ? ? ? ? ?Review of Systems: As mentioned in the history of present illness. All other systems reviewed and are negative. ?Past Medical History:  ?Diagnosis Date  ? COPD (chronic  obstructive pulmonary disease) with chronic bronchitis ([place])   ? HTN (hypertension)   ? Pneumothorax   ? ?Past Surgical History:  ?Procedure Laterality Date  ? HIP ARTHROPLASTY    ? ?Social History:  reports that she quit smoking about 10 years ago. Her smoking use included cigarettes. She has a 26.50 pack-year smoking history. She quit smokeless tobacco use about 10 years ago. She reports that she does not currently use alcohol. She reports that she does not use drugs. ? ?No Known Allergies ? ?Family History  ?Problem Relation Age of Onset  ? Cancer Father 53  ?     stomach  ? ? ?Prior to Admission medications   ?Medication Sig Start Date End Date Taking? Authorizing Provider  ?acetaminophen (TYLENOL) 500 MG tablet Take 500 mg by mouth every 6 (six) hours as needed for moderate pain or headache.   Yes [provider]  ?atenolol-chlorthalidone (TENORETIC) 50-25 MG tablet Take 1 tablet by mouth daily. 03/23/21  Yes [provider]  ?atorvastatin (LIPITOR) 40 MG tablet Take 40 mg by mouth daily. 03/23/21  Yes [provider]  ?etodolac (LODINE) 400 MG tablet Take 1 tablet (400 mg total) by mouth every 12 (twelve) hours as needed. ?Patient taking differently: Take 400 mg by mouth 2 (two) times daily. 04/14/21 05/14/21 Yes Icard, Octavio Graves, DO  ?gabapentin (NEURONTIN) 300 MG capsule Take 300 mg by mouth 2 (two) times daily. 03/23/21  Yes [provider]  ?meloxicam (  MOBIC) 15 MG tablet Take 15 mg by mouth daily as needed for pain. 03/23/21  Yes [provider]  ?metFORMIN (GLUCOPHAGE) 500 MG tablet Take 500 mg by mouth daily. 03/21/21  Yes [provider]  ?montelukast (SINGULAIR) 10 MG tablet Take 10 mg by mouth at bedtime. 03/23/21  Yes [provider]  ?promethazine-dextromethorphan (PROMETHAZINE-DM) 6.25-15 MG/5ML syrup Take 5 mLs by mouth 4 (four) times daily as needed for cough. 03/29/21  Yes [provider]  ?Delfin Edis 250-50 MCG/ACT AEPB 1 puff 2 (two)  times daily. 03/23/21  Yes [provider]  ?levofloxacin (LEVAQUIN) 750 MG tablet Take 750 mg by mouth See admin instructions. Qd x 10 days ?Patient not taking: Reported on 04/19/2021 03/29/21   [provider]  ? ? ?Physical Exam: ?Vitals:  ? 04/19/21 1200 04/19/21 1230 04/19/21 1315 04/19/21 1421  ?BP:  (!) 116/58 132/65 105/62  ?Pulse: 71 80 77 71  ?Resp: 17 18 20 14   ?Temp:    98.5 ?F (36.9 ?C)  ?TempSrc:    Oral  ?SpO2: 100% 100% 100% 97%  ?Weight:    43.4 kg  ?Height:    5\' 5"  (1.651 m)  ? ?General:  Appears frail, cachectic ?Eyes:  PERRL, EOMI, normal lids, iris ?ENT:  grossly normal hearing, lips & tongue, mmm ?Neck:  no LAD, masses or thyromegaly ?Cardiovascular:  RRR, no m/r/g. No LE edema.  ?Respiratory:   CTA bilaterally with no wheezes/rales/rhonchi.  Normal respiratory effort. ?Abdomen:  soft, NT, ND ?Skin:  no rash or induration seen on limited exam ?Musculoskeletal:  grossly normal tone BUE/BLE, good ROM, no bony abnormality, TTP along R posterior shoulder and upper T-spine ?Psychiatric:  blunted mood and affect, speech fluent and appropriate, AOx3 ?Neurologic:  CN 2-12 grossly intact, moves all extremities in coordinated fashion ? ? ?Radiological Exams on Admission: ?Independently reviewed - see discussion in A/P where applicable ? ?CT Head Wo Contrast ? ?Result Date: 04/19/2021 ?CLINICAL DATA:  Ataxia.  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 EXAM: CT HEAD WITHOUT CONTRAST TECHNIQUE: Contiguous axial images were obtained from the base of the skull through the vertex without intravenous contrast. RADIATION DOSE REDUCTION: This exam was performed according to the departmental dose-optimization program which includes automated exposure control, adjustment of the mA and/or kV according to patient size and/or use of iterative reconstruction technique. COMPARISON:  04/16/2011 FINDINGS: Brain: Diffuse cerebral atrophy. Ventricular dilatation consistent with central atrophy. Low-attenuation changes in the  deep white matter consistent with small vessel ischemia. No abnormal extra-axial fluid collections. No mass effect or midline shift. Gray-white matter junctions are distinct. Basal cisterns are not effaced. No acute intracranial hemorrhage. No focal lesions are identified that might indicate metastatic disease. However, MRI is more sensitive for evaluation of metastatic lesions. Vascular: Intracranial arterial vascular calcifications. Skull: Calvarium appears intact. No acute depressed skull fractures. No focal bone lesions. Sinuses/Orbits: Paranasal sinuses demonstrate opacification of the sphenoid sinuses. Mastoid air cells are clear. 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 IMPRESSION: 1. No acute intracranial abnormalities. Chronic atrophy and small vessel ischemic changes. 2. No focal intracranial lesions are demonstrated. However, MRI is more sensitive for evaluation of intracranial metastatic disease. Electronically Signed   By: Lucienne Capers M.D.   On: 04/19/2021 00:04  ? ?MR BRAIN W WO CONTRAST ? ?Result Date: 04/19/2021 ?CLINICAL DATA:  76 year old female diagnosed last month with suspicious right lower lobe mass. Ataxia. EXAM: MRI HEAD WITHOUT AND WITH CONTRAST TECHNIQUE: Multiplanar, multiecho pulse sequences of  the brain and surrounding structures were obtained without and with intravenous contrast. CONTRAST:  4.28mL GADAVIST GADOBUTROL 1 MMOL/ML IV SOLN COMPARISON:  Head CT without contrast yesterday. [facility] Neck CT 04/19/2017. FINDINGS: Brain: No restricted diffusion to suggest acute infarction. No midline shift, mass effect, evidence of mass lesion, ventriculomegaly, extra-axial collection or acute intracranial hemorrhage. Cervicomedullary junction and pituitary are within normal limits. No abnormal intracranial enhancement or dural thickening identified. Patchy and confluent widespread bilateral cerebral white matter T2  and FLAIR hyperintensity. Moderate similar T2 heterogeneity throughout the bilateral deep gray matter nuclei. Comparatively mild T2 heterogeneity in the pons. Chronic microhemorrhage in the right thalamus.

## 2021-04-19 NOTE — ED Notes (Signed)
Assisted patient to get up and ambulate in the room.  Gait very unsteady.  Two person assist necessary. ?

## 2021-04-19 NOTE — ED Notes (Signed)
Patient transported to MRI 

## 2021-04-19 NOTE — ED Notes (Signed)
Assisted pt to bedside commode, pt and pts caregiver educated to use call light for assistance when needs help getting back to bed, both pt and pts caregiver stated understanding ?

## 2021-04-19 NOTE — Consult Note (Signed)
?Radiation Oncology         ([phone]) [phone] ?________________________________ ? ?Name: Anna Finley        MRN: [id number]  ?Date of Service: 04/19/2021 DOB: 10-Nov-1945 ? ?PP:[id number], Iona Beard, MD  Kary Kos, MD    ? ?REFERRING PHYSICIAN: Kary Kos, MD ? ? ?DIAGNOSIS: Diagnoses of Spinal cord compression (HCC) and Malignant neoplasm of lower lobe of right lung ([place]) were pertinent to this visit. ? ? ?HISTORY OF PRESENT ILLNESS: Anna Finley is a 76 y.o. female seen at the request of Dr. Saintclair Halsted for a probable stage IV lung cancer.  The patient apparently has a history of COPD asthma and hypertension and quit smoking 10 years ago after a spontaneous pneumothorax.  She was evaluated for symptoms of pneumonia at [facility] and a CT of the chest without contrast on 03/26/2021 showed a large right lower lobe mass measuring up to 6.3 cm distal to this was irregular patchy consolidation and groundglass opacity representing postobstructive pneumonitis or extension of disease, there was a 9 mm subsolid nodule as well as a 5 mm nodule both in the right middle lobe. ? ?She was seen by pulmonary medicine and was getting set up for bronchoscopy and PET scan on 04/29/2021.  She presented to the emergency department today however with she has been off balance and unable to walk straight for several days.  Due to this she presented to the emergency department and a CT head showed no acute intracranial abnormalities, MRI brain with and without contrast showed no intracranial disease but abnormal right neck soft tissues with a 14 mm right jugular vein thrombosis versus carotid tumor versus malignant lymph node.  An MRI of the cervical spine showed severe pathologic compression fracture of T3 with bulky enhancing tumor in the upper thoracic spinal canal and spinal cord with possible T3 level cord edema and tumor obliterating the T3 and T2 neural foramina.  She was unable to tolerate MRI of her thoracic or lumbar spine due to the  time of laying on the table and will go back hopefully this afternoon for this procedure.  Pulmonary medicine is also aware of the patient and plans to take her for bronchoscopy tomorrow at [facility] in the afternoon.  I discussed her case with Dr. Saintclair Halsted, he does not recommend surgical intervention at this time, Dr. Lisbeth Renshaw has evaluated her films as well and would recommend a palliative course of radiotherapy.  She is contacted today by phone since she has not been assigned a bed. ? ? ?PREVIOUS RADIATION THERAPY: No ? ? ?PAST MEDICAL HISTORY:  ?Past Medical History:  ?Diagnosis Date  ? COPD (chronic obstructive pulmonary disease) with chronic bronchitis ([place])   ? HTN (hypertension)   ? Pneumothorax   ?   ? ? ?PAST SURGICAL HISTORY: ?Past Surgical History:  ?Procedure Laterality Date  ? HIP ARTHROPLASTY    ? ? ? ?FAMILY HISTORY:  ?Family History  ?Problem Relation Age of Onset  ? Cancer Father 59  ?     stomach  ? ? ? ?SOCIAL HISTORY:  reports that she quit smoking about 10 years ago. Her smoking use included cigarettes. She has a 26.50 pack-year smoking history. She quit smokeless tobacco use about 10 years ago. She reports that she does not currently use alcohol. She reports that she does not use drugs. ? ? ?ALLERGIES: Patient has no known allergies. ? ? ?MEDICATIONS:  ?No current facility-administered medications for this encounter.  ? ?Current Outpatient Medications  ?Medication  Sig Dispense Refill  ? acetaminophen (TYLENOL) 500 MG tablet Take 500 mg by mouth every 6 (six) hours as needed for moderate pain or headache.    ? atenolol-chlorthalidone (TENORETIC) 50-25 MG tablet Take 1 tablet by mouth daily.    ? atorvastatin (LIPITOR) 40 MG tablet Take 40 mg by mouth daily.    ? etodolac (LODINE) 400 MG tablet Take 1 tablet (400 mg total) by mouth every 12 (twelve) hours as needed. (Patient taking differently: Take 400 mg by mouth 2 (two) times daily.) 60 tablet 0  ? gabapentin (NEURONTIN) 300 MG capsule Take 300 mg  by mouth 2 (two) times daily.    ? meloxicam (MOBIC) 15 MG tablet Take 15 mg by mouth daily as needed for pain.    ? metFORMIN (GLUCOPHAGE) 500 MG tablet Take 500 mg by mouth daily.    ? montelukast (SINGULAIR) 10 MG tablet Take 10 mg by mouth at bedtime.    ? promethazine-dextromethorphan (PROMETHAZINE-DM) 6.25-15 MG/5ML syrup Take 5 mLs by mouth 4 (four) times daily as needed for cough.    ? WIXELA INHUB 250-50 MCG/ACT AEPB 1 puff 2 (two) times daily.    ? ?Facility-Administered Medications Ordered in Other Encounters  ?Medication Dose Route Frequency Provider Last Rate Last Admin  ? acetaminophen (TYLENOL) tablet 650 mg  650 mg Oral Q6H PRN Karmen Bongo, MD      ? Or  ? acetaminophen (TYLENOL) suppository 650 mg  650 mg Rectal Q6H PRN Karmen Bongo, MD      ? albuterol (PROVENTIL) (2.5 MG/3ML) 0.083% nebulizer solution 2.5 mg  2.5 mg Nebulization Q2H PRN Karmen Bongo, MD      ? atorvastatin (LIPITOR) tablet 40 mg  40 mg Oral Daily Karmen Bongo, MD   40 mg at 04/19/21 1008  ? bisacodyl (DULCOLAX) EC tablet 5 mg  5 mg Oral Daily PRN Karmen Bongo, MD      ? docusate sodium (COLACE) capsule 100 mg  100 mg Oral BID Karmen Bongo, MD   100 mg at 04/19/21 1008  ? gabapentin (NEURONTIN) capsule 300 mg  300 mg Oral BID Karmen Bongo, MD   300 mg at 04/19/21 1008  ? hydrALAZINE (APRESOLINE) injection 5 mg  5 mg Intravenous Q4H PRN Karmen Bongo, MD      ? insulin aspart (novoLOG) injection 0-5 Units  0-5 Units Subcutaneous QHS Karmen Bongo, MD      ? insulin aspart (novoLOG) injection 0-9 Units  0-9 Units Subcutaneous TID WC Karmen Bongo, MD      ? Derrill Memo ON 04/20/2021] lactated ringers infusion   Intravenous Continuous Karmen Bongo, MD      ? mometasone-formoterol [facility]) 200-5 MCG/ACT inhaler 2 puff  2 puff Inhalation BID Karmen Bongo, MD      ? montelukast (SINGULAIR) tablet 10 mg  10 mg Oral QHS Karmen Bongo, MD      ? morphine (PF) 2 MG/ML injection 2 mg  2 mg Intravenous Q2H PRN  Karmen Bongo, MD      ? ondansetron [facility]) tablet 4 mg  4 mg Oral Q6H PRN Karmen Bongo, MD      ? Or  ? ondansetron [facility]) injection 4 mg  4 mg Intravenous Q6H PRN Karmen Bongo, MD      ? oxyCODONE (Oxy IR/ROXICODONE) immediate release tablet 5 mg  5 mg Oral Q4H PRN Karmen Bongo, MD      ? polyethylene glycol (MIRALAX / GLYCOLAX) packet 17 g  17 g Oral Daily PRN Karmen Bongo,  MD      ? ? ? ?REVIEW OF SYSTEMS: On review of systems, the patient reports that she is doing okay.  She states that she has been having a hard time walking now for several days.  She lives at home with her daughter Peter [place], and her granddaughter helps her to go for doctors appointments and for transportation.  She is unavailable during our discussion today and unfortunately I have tried reaching her on several occasions and her number takes me to voicemail.  We discussed her other symptoms of shortness of breath at rest and exertion, and she has had small amounts of hemoptysis for approximately 1 month's time.  She has had unintended weight loss.  No other complaints are verbalized. ? ? ?PHYSICAL EXAM:  ?Wt Readings from Last 3 Encounters:  ?04/18/21 95 lb (43.1 kg)  ?04/14/21 95 lb (43.1 kg)  ? ?Temp Readings from Last 3 Encounters:  ?04/19/21 98.1 ?F (36.7 ?C) (Oral)  ?04/14/21 97.6 ?F (36.4 ?C) (Oral)  ? ?BP Readings from Last 3 Encounters:  ?04/19/21 112/61  ?04/14/21 122/60  ? ?Pulse Readings from Last 3 Encounters:  ?04/19/21 68  ?04/14/21 66  ? ?   ? ?Unable to assess given encounter type. ? ? ?ECOG = 3 ? ?0 - Asymptomatic (Fully active, able to carry on all predisease activities without restriction) ? ?1 - Symptomatic but completely ambulatory (Restricted in physically strenuous activity but ambulatory and able to carry out work of a light or sedentary nature. For example, light housework, office work) ? ?2 - Symptomatic, <50% in bed during the day (Ambulatory and capable of all self care but unable to carry out any  work activities. Up and about more than 50% of waking hours) ? ?3 - Symptomatic, >50% in bed, but not bedbound (Capable of only limited self-care, confined to bed or chair 50% or more of waking hours) ?

## 2021-04-19 NOTE — Consult Note (Signed)
? ?NAME:  Anna Finley, MRN:  [id number], DOB:  October 13, 1945, LOS: 0 ?ADMISSION DATE:  04/18/2021, CONSULTATION DATE:  04/19/21 ?REFERRING MD:  Lorin Mercy, CHIEF COMPLAINT:  back pain  ? ?History of Present Illness:  ?76 year old woman with hx of HTN, COPD, lung mass who presented with back and shoulder pain.  Workup in ER revealed pathologic T3 compression fracture.  She is sent to [facility] for expedited tissue diagnosis and radiation consideration ? ?Pertinent  Medical History  ?COPD ?HTN ? ?Significant Hospital Events: ?Including procedures, antibiotic start and stop dates in addition to other pertinent events   ?4/3 admission ? ?Interim History / Subjective:  ?Consulted ?Pain in back okay unless she moves ? ?Objective   ?Blood pressure 105/62, pulse 71, temperature 98.5 ?F (36.9 ?C), temperature source Oral, resp. rate 14, height 5\' 5"  (1.651 m), weight 43.4 kg, SpO2 97 %. ?   ?   ? ?Intake/Output Summary (Last 24 hours) at 04/19/2021 1504 ?Last data filed at 04/19/2021 1232 ?Gross per 24 hour  ?Intake --  ?Output 300 ml  ?Net -300 ml  ? ?Filed Weights  ? 04/18/21 2258 04/19/21 1421  ?Weight: 43.1 kg 43.4 kg  ? ? ?Examination: ?General: pleasant elderly woman in NAD ?HENT: MMM, trachea midline ?Lungs: Diminished R base, no accessory muscle se ?Cardiovascular: RRR, ext warm ?Abdomen: Soft, +BS ?Extremities: No edema, changes c/w advanced osteoarthritis ?Neuro: Moves all 4 ext to command but painful for her to change positions ?Skin: no rashes ? ?Resolved Hospital Problem list   ?N/A ? 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 ?- Bronch/EBUS tomorrow afternoon ?- Simulation per radOnc ?- Will need MedOnc once tissue path comes back ?- Patient updated and agrees with plan ?- Pain control per primary ?- Continue steroids ?- Will follow ? ?Best Practice (right click and "Reselect all SmartList Selections" daily)  ? ?Per primary ? ?Labs   ?CBC: ?Recent Labs  ?Lab 04/18/21 ?2336  ?WBC 9.7   ?NEUTROABS 6.3  ?HGB 9.9*  ?HCT 29.9*  ?MCV 84.9  ?PLT 376  ? ? ?Basic Metabolic Panel: ?Recent Labs  ?Lab 04/18/21 ?2336  ?NA 134*  ?K 3.4*  ?CL 99  ?CO2 26  ?GLUCOSE 135*  ?BUN 20  ?CREATININE 0.91  ?CALCIUM 8.8*  ? ?GFR: ?Estimated Creatinine Clearance: 37.2 mL/min (by C-G formula based on SCr of 0.91 mg/dL). ?Recent Labs  ?Lab 04/18/21 ?2336  ?WBC 9.7  ? ? ?Liver Function Tests: ?No results for input(s): AST, ALT, ALKPHOS, BILITOT, PROT, ALBUMIN in the last 168 hours. ?No results for input(s): LIPASE, AMYLASE in the last 168 hours. ?No results for input(s): AMMONIA in the last 168 hours. ? ?ABG ?No results found for: PHART, PCO2ART, PO2ART, HCO3, TCO2, ACIDBASEDEF, O2SAT  ? ?Coagulation Profile: ?No results for input(s): INR, PROTIME in the last 168 hours. ? ?Cardiac Enzymes: ?No results for input(s): CKTOTAL, CKMB, CKMBINDEX, TROPONINI in the last 168 hours. ? ?HbA1C: ?No results found for: HGBA1C ? ?CBG: ?Recent Labs  ?Lab 04/19/21 ?1157  ?GLUCAP 117*  ? ? ?Review of Systems:   ? ?Positive Symptoms in bold: ? ?Constitutional fevers, chills, weight loss, fatigue, anorexia, malaise  ?Eyes decreased vision, double vision, eye irritation  ?Ears, Nose, Mouth, Throat sore throat, trouble swallowing, sinus congestion  ?Cardiovascular chest pain, paroxysmal nocturnal dyspnea, lower ext edema, palpitations ?  ?Respiratory SOB, cough, DOE, hemoptysis, wheezing  ?Gastrointestinal nausea, vomiting, diarrhea  ?Genitourinary burning with urination, trouble urinating  ?Musculoskeletal joint  aches, joint swelling, back pain  ?Integumentary  rashes, skin lesions  ?Neurological focal weakness, focal numbness, trouble speaking, headaches  ?Psychiatric depression, anxiety, confusion  ?Endocrine polyuria, polydipsia, cold intolerance, heat intolerance  ?Hematologic abnormal bruising, abnormal bleeding, unexplained nose bleeds  ?Allergic/Immunologic recurrent infections, hives, swollen lymph nodes  ? ? ? ?Past Medical History:   ?She,  has a past medical history of COPD (chronic obstructive pulmonary disease) with chronic bronchitis ([place]), HTN (hypertension), and Pneumothorax.  ? ?Surgical History:  ? ?Past Surgical History:  ?Procedure Laterality Date  ? HIP ARTHROPLASTY    ?  ? ?Social History:  ? reports that she quit smoking about 10 years ago. Her smoking use included cigarettes. She has a 26.50 pack-year smoking history. She quit smokeless tobacco use about 10 years ago. She reports that she does not currently use alcohol. She reports that she does not use drugs.  ? ?Family History:  ?Her family history includes Cancer (age of onset: 16) in her father.  ? ?Allergies ?No Known Allergies  ? ?Home Medications  ?Prior to Admission medications   ?Medication Sig Start Date End Date Taking? Authorizing Provider  ?acetaminophen (TYLENOL) 500 MG tablet Take 500 mg by mouth every 6 (six) hours as needed for moderate pain or headache.   Yes [provider]  ?atenolol-chlorthalidone (TENORETIC) 50-25 MG tablet Take 1 tablet by mouth daily. 03/23/21  Yes [provider]  ?atorvastatin (LIPITOR) 40 MG tablet Take 40 mg by mouth daily. 03/23/21  Yes [provider]  ?etodolac (LODINE) 400 MG tablet Take 1 tablet (400 mg total) by mouth every 12 (twelve) hours as needed. ?Patient taking differently: Take 400 mg by mouth 2 (two) times daily. 04/14/21 05/14/21 Yes Icard, Octavio Graves, DO  ?gabapentin (NEURONTIN) 300 MG capsule Take 300 mg by mouth 2 (two) times daily. 03/23/21  Yes [provider]  ?meloxicam (MOBIC) 15 MG tablet Take 15 mg by mouth daily as needed for pain. 03/23/21  Yes [provider]  ?metFORMIN (GLUCOPHAGE) 500 MG tablet Take 500 mg by mouth daily. 03/21/21  Yes [provider]  ?montelukast (SINGULAIR) 10 MG tablet Take 10 mg by mouth at bedtime. 03/23/21  Yes [provider]  ?promethazine-dextromethorphan (PROMETHAZINE-DM) 6.25-15 MG/5ML syrup Take 5 mLs by mouth 4 (four) times  daily as needed for cough. 03/29/21  Yes [provider]  ?Delfin Edis 250-50 MCG/ACT AEPB 1 puff 2 (two) times daily. 03/23/21  Yes [provider]  ?  ? ? ? ? ? ? ?

## 2021-04-19 NOTE — ED Notes (Signed)
Patient provided with socks and warm blankets. Patient's granddaughter remains at bedside. NIBP and pulse ox in place. No complaints of pain from patient at this time. Call bell within reach and patient and family instructed on use.  ?

## 2021-04-20 ENCOUNTER — Inpatient Hospital Stay (HOSPITAL_COMMUNITY): Payer: Medicare Other

## 2021-04-20 ENCOUNTER — Inpatient Hospital Stay (HOSPITAL_COMMUNITY): Payer: Medicare Other | Admitting: Certified Registered Nurse Anesthetist

## 2021-04-20 ENCOUNTER — Encounter (HOSPITAL_COMMUNITY): Payer: Self-pay | Admitting: Internal Medicine

## 2021-04-20 ENCOUNTER — Ambulatory Visit
Admission: RE | Admit: 2021-04-20 | Discharge: 2021-04-20 | Disposition: A | Discharge status: Home / Self Care | Payer: Medicare Other | Source: Ambulatory Visit | Attending: Radiation Oncology | Admitting: Radiation Oncology

## 2021-04-20 ENCOUNTER — Encounter (HOSPITAL_COMMUNITY)
Admission: EM | Disposition: A | Discharge status: Home / Self Care | Payer: Self-pay | Source: Home / Self Care | Attending: Family Medicine

## 2021-04-20 DIAGNOSIS — R590 Localized enlarged lymph nodes: Secondary | ICD-10-CM

## 2021-04-20 DIAGNOSIS — I10 Essential (primary) hypertension: Secondary | ICD-10-CM | POA: Diagnosis not present

## 2021-04-20 DIAGNOSIS — G9529 Other cord compression: Secondary | ICD-10-CM | POA: Insufficient documentation

## 2021-04-20 DIAGNOSIS — R262 Difficulty in walking, not elsewhere classified: Secondary | ICD-10-CM | POA: Insufficient documentation

## 2021-04-20 DIAGNOSIS — C3431 Malignant neoplasm of lower lobe, right bronchus or lung: Secondary | ICD-10-CM | POA: Diagnosis not present

## 2021-04-20 DIAGNOSIS — Z510 Encounter for antineoplastic radiation therapy: Secondary | ICD-10-CM | POA: Insufficient documentation

## 2021-04-20 DIAGNOSIS — R918 Other nonspecific abnormal finding of lung field: Secondary | ICD-10-CM

## 2021-04-20 DIAGNOSIS — C7951 Secondary malignant neoplasm of bone: Secondary | ICD-10-CM | POA: Insufficient documentation

## 2021-04-20 DIAGNOSIS — Z87891 Personal history of nicotine dependence: Secondary | ICD-10-CM

## 2021-04-20 DIAGNOSIS — G9520 Unspecified cord compression: Secondary | ICD-10-CM | POA: Diagnosis not present

## 2021-04-20 DIAGNOSIS — E876 Hypokalemia: Secondary | ICD-10-CM

## 2021-04-20 DIAGNOSIS — J449 Chronic obstructive pulmonary disease, unspecified: Secondary | ICD-10-CM | POA: Diagnosis not present

## 2021-04-20 DIAGNOSIS — Z79899 Other long term (current) drug therapy: Secondary | ICD-10-CM | POA: Insufficient documentation

## 2021-04-20 DIAGNOSIS — E43 Unspecified severe protein-calorie malnutrition: Secondary | ICD-10-CM | POA: Insufficient documentation

## 2021-04-20 DIAGNOSIS — C7971 Secondary malignant neoplasm of right adrenal gland: Secondary | ICD-10-CM | POA: Insufficient documentation

## 2021-04-20 HISTORY — PX: BRONCHIAL WASHINGS: SHX5105

## 2021-04-20 HISTORY — PX: ENDOBRONCHIAL ULTRASOUND: SHX5096

## 2021-04-20 HISTORY — PX: BRONCHIAL NEEDLE ASPIRATION BIOPSY: SHX5106

## 2021-04-20 HISTORY — PX: VIDEO BRONCHOSCOPY: SHX5072

## 2021-04-20 LAB — BASIC METABOLIC PANEL
Anion gap: 8 (ref 5–15)
BUN: 20 mg/dL (ref 8–23)
CO2: 28 mmol/L (ref 22–32)
Calcium: 9 mg/dL (ref 8.9–10.3)
Chloride: 97 mmol/L — ABNORMAL LOW (ref 98–111)
Creatinine, Ser: 0.64 mg/dL (ref 0.44–1.00)
GFR, Estimated: >60 mL/min (ref >60)
Glucose, Bld: 150 mg/dL — ABNORMAL HIGH (ref 70–99)
Potassium: 4.2 mmol/L (ref 3.5–5.1)
Sodium: 133 mmol/L — ABNORMAL LOW (ref 135–145)

## 2021-04-20 LAB — CBC
HCT: 32.5 % — ABNORMAL LOW (ref 36.0–46.0)
Hemoglobin: 10.6 g/dL — ABNORMAL LOW (ref 12.0–15.0)
MCH: 28.1 pg (ref 26.0–34.0)
MCHC: 32.6 g/dL (ref 30.0–36.0)
MCV: 86.2 fL (ref 80.0–100.0)
Platelets: 386 10*3/uL (ref 150–400)
RBC: 3.77 MIL/uL — ABNORMAL LOW (ref 3.87–5.11)
RDW: 16.6 % — ABNORMAL HIGH (ref 11.5–15.5)
WBC: 7.6 10*3/uL (ref 4.0–10.5)
nRBC: 0 % (ref 0.0–0.2)

## 2021-04-20 LAB — GLUCOSE, CAPILLARY
Glucose-Capillary: 108 mg/dL — ABNORMAL HIGH (ref 70–99)
Glucose-Capillary: 174 mg/dL — ABNORMAL HIGH (ref 70–99)

## 2021-04-20 IMAGING — DX DG CHEST 1V PORT
1 series · 1 of 1 positions shown · non-contrast
Comparison: [DATE].

CLINICAL DATA: Status post bronchoscopy.

EXAM:
PORTABLE CHEST 1 VIEW

[chest ap]
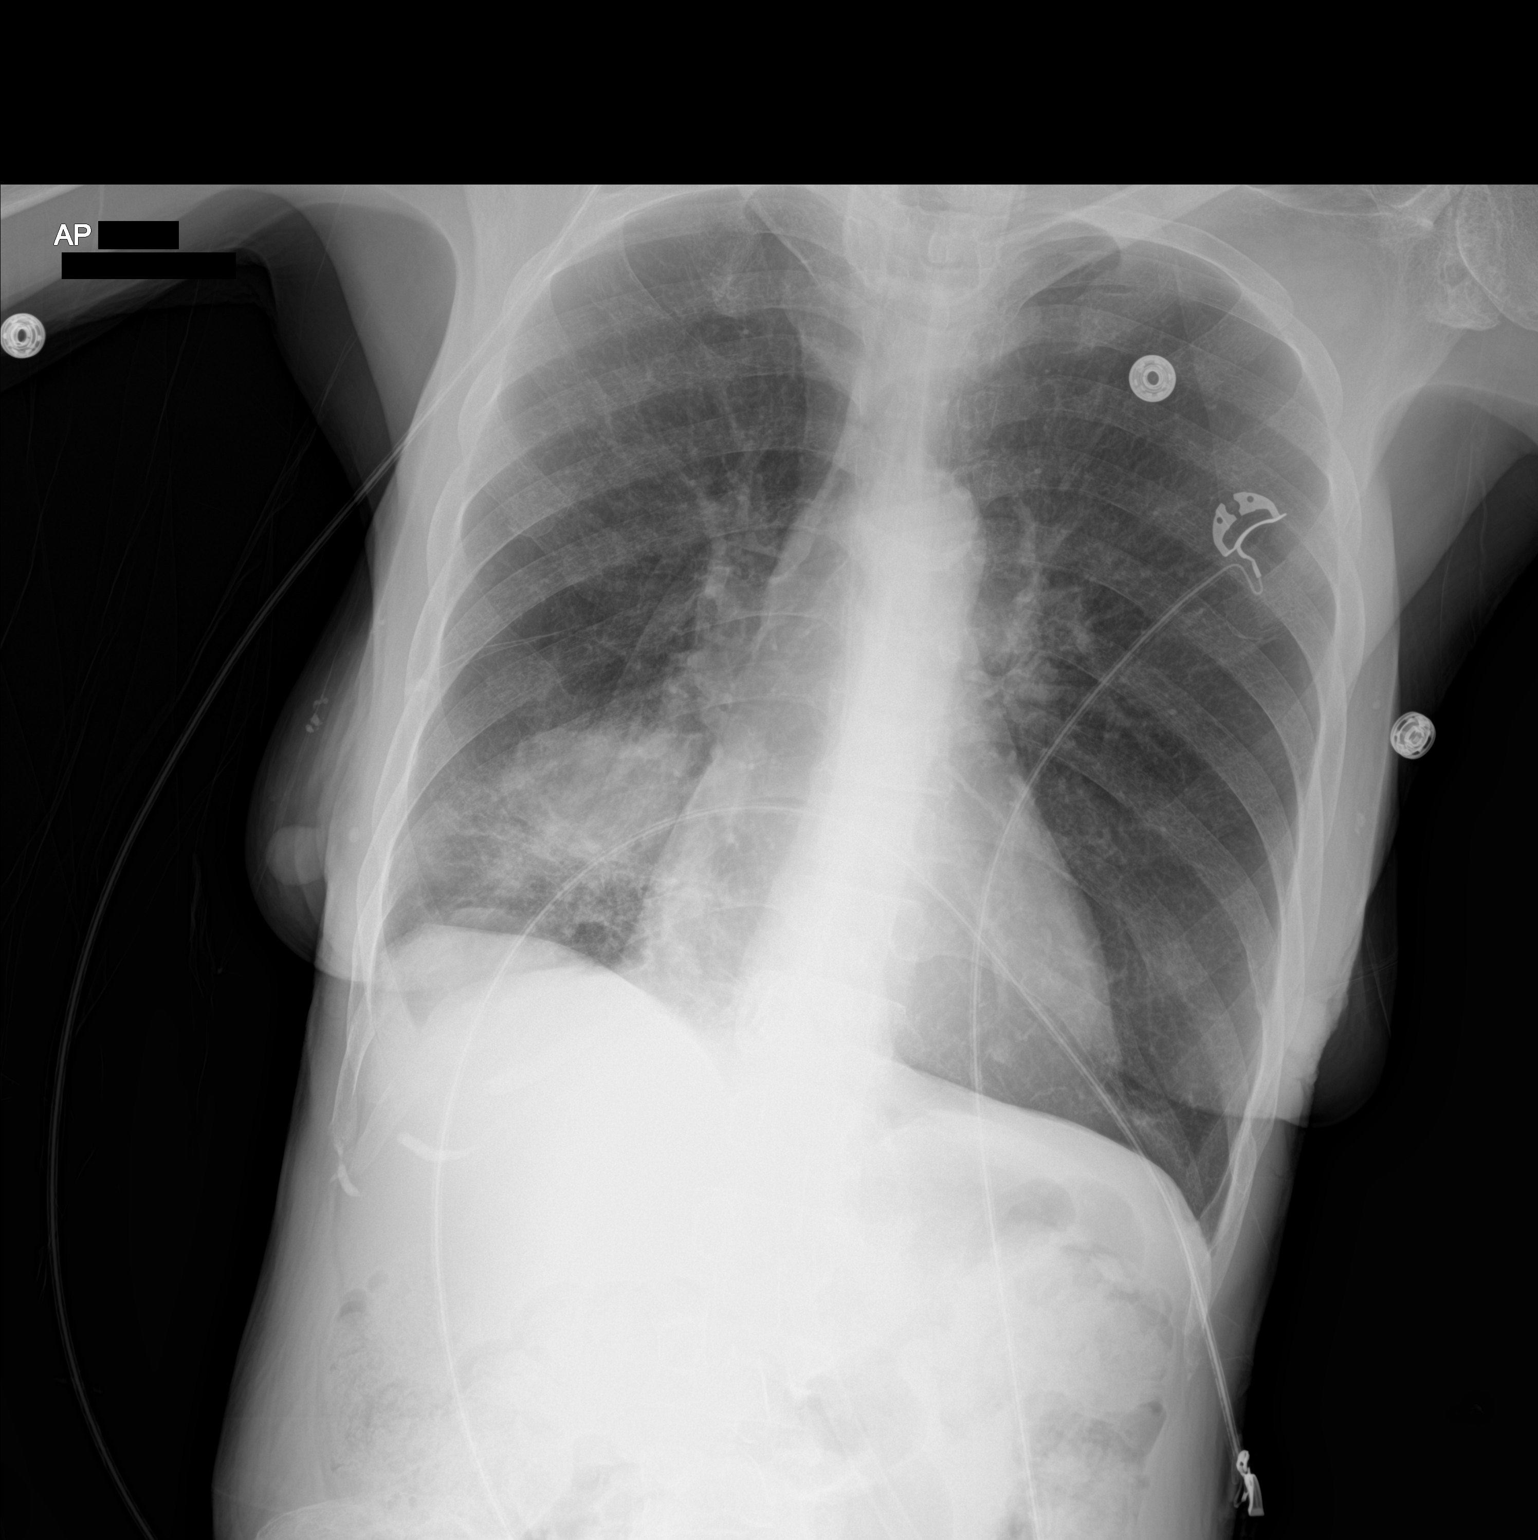

[1 of 1 positions shown; findings below may reference images not displayed]

FINDINGS: The heart size and mediastinal contours are within normal limits.
Left lung is clear. Large rounded density is noted in right lower
lobe concerning for neoplasm or malignancy. No pneumothorax is
noted. The visualized skeletal structures are unremarkable.
IMPRESSION: Large rounded density is noted in right lower lobe concerning for
neoplasm or malignancy. No pneumothorax is noted.

## 2021-04-20 SURGERY — VIDEO BRONCHOSCOPY WITH FLUORO
Anesthesia: General

## 2021-04-20 MED ORDER — ALBUTEROL SULFATE (2.5 MG/3ML) 0.083% IN NEBU: 2.5000 mg | INHALATION_SOLUTION | RESPIRATORY_TRACT | Status: DC | PRN

## 2021-04-20 MED ORDER — PROPOFOL 10 MG/ML IV BOLUS
INTRAVENOUS | Status: AC
  Filled 2021-04-20: qty 20

## 2021-04-20 MED ORDER — SUGAMMADEX SODIUM 200 MG/2ML IV SOLN
INTRAVENOUS | Status: DC | PRN
  Administered 2021-04-20: 200 mg via INTRAVENOUS

## 2021-04-20 MED ORDER — IPRATROPIUM-ALBUTEROL 0.5-2.5 (3) MG/3ML IN SOLN
3.0000 mL | Freq: Three times a day (TID) | RESPIRATORY_TRACT | Status: DC
  Administered 2021-04-21: 3 mL via RESPIRATORY_TRACT
  Filled 2021-04-20: qty 3

## 2021-04-20 MED ORDER — ATENOLOL-CHLORTHALIDONE 50-25 MG PO TABS: 1.0000 | ORAL_TABLET | Freq: Every day | ORAL | Status: DC

## 2021-04-20 MED ORDER — ROCURONIUM BROMIDE 10 MG/ML (PF) SYRINGE
PREFILLED_SYRINGE | INTRAVENOUS | Status: DC | PRN
  Administered 2021-04-20: 50 mg via INTRAVENOUS

## 2021-04-20 MED ORDER — PROPOFOL 10 MG/ML IV BOLUS
INTRAVENOUS | Status: DC | PRN
  Administered 2021-04-20: 120 mg via INTRAVENOUS

## 2021-04-20 MED ORDER — ONDANSETRON HCL 4 MG/2ML IJ SOLN
INTRAMUSCULAR | Status: DC | PRN
  Administered 2021-04-20: 4 mg via INTRAVENOUS

## 2021-04-20 MED ORDER — LACTATED RINGERS IV SOLN
INTRAVENOUS | Status: AC | PRN
  Administered 2021-04-20: 1000 mL via INTRAVENOUS

## 2021-04-20 MED ORDER — LIDOCAINE 2% (20 MG/ML) 5 ML SYRINGE
INTRAMUSCULAR | Status: DC | PRN
  Administered 2021-04-20: 80 mg via INTRAVENOUS

## 2021-04-20 MED ORDER — DEXAMETHASONE SODIUM PHOSPHATE 4 MG/ML IJ SOLN
INTRAMUSCULAR | Status: DC | PRN
  Administered 2021-04-20: 8 mg via INTRAVENOUS

## 2021-04-20 MED ORDER — ALBUTEROL SULFATE (2.5 MG/3ML) 0.083% IN NEBU
INHALATION_SOLUTION | RESPIRATORY_TRACT | Status: AC
Start: 2021-04-20 — End: ?
  Filled 2021-04-20: qty 3

## 2021-04-20 MED ORDER — FENTANYL CITRATE (PF) 100 MCG/2ML IJ SOLN
50.0000 ug | Freq: Once | INTRAMUSCULAR | Status: DC
Start: 2021-04-20 — End: 2021-04-20

## 2021-04-20 MED ORDER — FENTANYL CITRATE (PF) 100 MCG/2ML IJ SOLN
INTRAMUSCULAR | Status: DC | PRN
  Administered 2021-04-20: 50 ug via INTRAVENOUS

## 2021-04-20 MED ORDER — CHLORTHALIDONE 25 MG PO TABS
25.0000 mg | ORAL_TABLET | Freq: Every day | ORAL | Status: DC
  Administered 2021-04-21 – 2021-04-26 (×6): 25 mg via ORAL
  Filled 2021-04-20 (×8): qty 1

## 2021-04-20 MED ORDER — ALBUTEROL SULFATE (2.5 MG/3ML) 0.083% IN NEBU
2.5000 mg | INHALATION_SOLUTION | Freq: Once | RESPIRATORY_TRACT | Status: AC
Start: 2021-04-20 — End: 2021-04-20
  Administered 2021-04-20: 2.5 mg via RESPIRATORY_TRACT

## 2021-04-20 MED ORDER — ATENOLOL 50 MG PO TABS
50.0000 mg | ORAL_TABLET | Freq: Every day | ORAL | Status: DC
  Administered 2021-04-21 – 2021-04-27 (×7): 50 mg via ORAL
  Filled 2021-04-20 (×7): qty 1

## 2021-04-20 MED ORDER — FENTANYL CITRATE (PF) 100 MCG/2ML IJ SOLN
INTRAMUSCULAR | Status: AC
  Filled 2021-04-20: qty 2

## 2021-04-20 MED ORDER — IPRATROPIUM-ALBUTEROL 0.5-2.5 (3) MG/3ML IN SOLN
3.0000 mL | Freq: Four times a day (QID) | RESPIRATORY_TRACT | Status: DC
  Administered 2021-04-20 (×2): 3 mL via RESPIRATORY_TRACT
  Filled 2021-04-20 (×2): qty 3

## 2021-04-20 NOTE — Op Note (Addendum)
Flexible and EBUS Bronchoscopy Procedure Note ? ?Anna Finley  ?[id number]  ?Oct 12, 1945 ? ?Date:04/20/21  ?Time:2:35 PM  ? ?Provider Performing:Demari Kropp C Tamala Julian  ? ?Procedure: Flexible bronchoscopy and EBUS Bronchoscopy ? ?Indication(s) ?Lung mass, adenopathy ? ?Consent ?Risks of the procedure as well as the alternatives and risks of each were explained to the patient and/or caregiver.  Consent for the procedure was obtained. ? ?Anesthesia ?General Anesthesia ? ? ?Time Out ?Verified patient identification, verified procedure, site/side was marked, verified correct patient position, special equipment/implants available, medications/allergies/relevant history reviewed, required imaging and test results available. ? ? ?Sterile Technique ?Usual hand hygiene, masks, gowns, and gloves were used ? ? ?Procedure Description ?The EBUS bronchoscope was advanced into airway with station 7 and RLL endobronchial mass biopsied and sent for slide, cell block, and/or culture.  The EBUS bronchoscope was removed after assuring no active bleeding from biopsy site. ? ?Regular bronch advanced into airway and blood cleaned up from biopsy sites, performed BAL in RLL for cyto. ? 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 ? ?Complications/Tolerance ?None; patient tolerated the procedure well. ?Chest X-ray is not needed post procedure. ? ? ?EBL ?Minimal ? ? ?Specimen(s) ?Station 7 LN ?RLL endobronchial mass ?BAL RLL: cyto ? ? ?View from bronchus intermedius of RLL mass encroachment, RML open at 10 o'clock ? ? ?

## 2021-04-20 NOTE — Transfer of Care (Signed)
Immediate Anesthesia Transfer of Care Note ? ?Patient: Anna Finley ? ?Procedure(s) Performed: Procedure(s) with comments: ?VIDEO BRONCHOSCOPY WITH FLUORO (Left) - EBUS ?ENDOBRONCHIAL ULTRASOUND (N/A) ?BRONCHIAL NEEDLE ASPIRATION BIOPSIES ?BRONCHIAL WASHINGS ? ?Patient Location: PACU ? ?Anesthesia Type:General ? ?Level of Consciousness: Patient easily awoken, sedated, comfortable, cooperative, following commands, responds to stimulation.  ? ?Airway & Oxygen Therapy: Patient spontaneously breathing, ventilating well, oxygen via simple oxygen mask. ? ?Post-op Assessment: Report given to PACU RN, vital signs reviewed and stable, moving all extremities.  ? ?Post vital signs: Reviewed and stable. ? ?Complications: No apparent anesthesia complications ? ?Last Vitals:  ?Vitals Value Taken Time  ?BP 195/182 04/20/21 1456  ?Temp    ?Pulse 110 04/20/21 1457  ?Resp 26 04/20/21 1457  ?SpO2 100 % 04/20/21 1457  ?Vitals shown include unvalidated device data. ? ?Last Pain:  ?Vitals:  ? 04/20/21 1232  ?TempSrc:   ?PainSc: 0-No pain  ?   ? ?  ? ?Complications: No notable events documented. ?

## 2021-04-20 NOTE — Progress Notes (Addendum)
Initial Nutrition Assessment ? ?DOCUMENTATION CODES:  ? ?Severe malnutrition in context of chronic illness, Underweight ? ?INTERVENTION:  ? ?-Recommend Regular diet once diet advanced given severe malnutrition ? ?-Adding ice cream and pudding to trays once diet advanced ? ?-Multivitamin with minerals daily ? ?-Placed "High Calorie, High Protein" handout in discharge instructions ? ?NUTRITION DIAGNOSIS:  ? ?Severe Malnutrition related to cancer and cancer related treatments, chronic illness as evidenced by percent weight loss, moderate fat depletion, severe muscle depletion. ? ?GOAL:  ? ?Patient will meet greater than or equal to 90% of their needs ? ?MONITOR:  ? ?PO intake, Labs, Weight trends, I & O's, Diet advancement ? ?REASON FOR ASSESSMENT:  ? ?Consult ?Assessment of nutrition requirement/status ? ?ASSESSMENT:  ? ?76 y.o. female with past medical history of hypertension, COPD and recent diagnosis of lung cancer but no biopsy yet who had seen I card on 04/14/2021 and his clinic with plans for PET scan presented to hospital with the shoulder upper neck pain and difficulty ambulating for 2 weeks. ? ?Patient in room with granddaughter at bedside. Pt lives with her granddaughter. They both report pt has a good appetite, feels hungry today. States she eats at least 2-3 meals a day with snacks in between and "all night" per her granddaughter. Pt consumes meats and other protein foods, drinks Mountain Dew at home.  ? ?Pt reports eating meatloaf with green beans and broccoli last night . Denies swallowing or chewing issues.  ?Doesn't drink protein supplements at home and does not seem interested. Will monitor POs and add pudding and ice cream to meals once diet advanced. ? ?Currently NPO , awaiting bronchoscopy.  ? ?Per weight records, pt has lost 16 lbs since 09/09/20 (14% wt loss x 7.5 months, significant for time frame).  ? ?Medications: Decadron, Colace ? ?Labs reviewed: ? CBGs: 97-117 ?Low Na ? ?NUTRITION - FOCUSED  PHYSICAL EXAM: ? ?Flowsheet Row Most Recent Value  ?Orbital Region Moderate depletion  ?Upper Arm Region Severe depletion  ?Thoracic and Lumbar Region Moderate depletion  ?Buccal Region Moderate depletion  ?Temple Region Moderate depletion  ?Clavicle Bone Region Severe depletion  ?Clavicle and Acromion Bone Region Severe depletion  ?Scapular Bone Region Severe depletion  ?Dorsal Hand Severe depletion  ?Patellar Region Severe depletion  [weakness from spinal cord compression]  ?Anterior Thigh Region Severe depletion  ?Posterior Calf Region Severe depletion  ?Edema (RD Assessment) None  ?Hair Unable to assess  ?Eyes Reviewed  ?Mouth Reviewed  [missing teeth, uses dentures]  ?Skin Reviewed  ? ?  ? ? ?Diet Order:   ?Diet Order   ? ?       ?  Diet NPO time specified  Diet effective midnight       ?  ? ?  ?  ? ?  ? ? ?EDUCATION NEEDS:  ? ?No education needs have been identified at this time ? ?Skin:  Skin Assessment: Reviewed RN Assessment ? ?Last BM:  4/2 ? ?Height:  ? ?Ht Readings from Last 1 Encounters:  ?04/19/21 5\' 5"  (1.651 m)  ? ? ?Weight:  ? ?Wt Readings from Last 1 Encounters:  ?04/19/21 43.4 kg  ? ? ?BMI:  Body mass index is 15.93 kg/m?. ? ?Estimated Nutritional Needs:  ? ?Kcal:  1700-1900 ? ?Protein:  80-90g ? ?Fluid:  1.9L/day ? ? ?Clayton Bibles, MS, RD, LDN ?Inpatient Clinical Dietitian ?Contact information available via Amion ? ?

## 2021-04-20 NOTE — Anesthesia Procedure Notes (Signed)
Procedure Name: Intubation ?Date/Time: 04/20/2021 1:54 PM ?Performed by: Deliah Boston, CRNA ?Pre-anesthesia Checklist: Patient identified, Emergency Drugs available, Suction available and Patient being monitored ?Patient Re-evaluated:Patient Re-evaluated prior to induction ?Oxygen Delivery Method: Circle system utilized ?Preoxygenation: Pre-oxygenation with 100% oxygen ?Induction Type: IV induction ?Ventilation: Mask ventilation without difficulty ?Laryngoscope Size: Mac and 3 ?Grade View: Grade I ?Tube type: Oral ?Tube size: 8.5 mm ?Number of attempts: 1 ?Airway Equipment and Method: Stylet and Oral airway ?Placement Confirmation: ETT inserted through vocal cords under direct vision, positive ETCO2 and breath sounds checked- equal and bilateral ?Secured at: 22 cm ?Tube secured with: Tape ?Dental Injury: Teeth and Oropharynx as per pre-operative assessment  ? ? ? ? ?

## 2021-04-20 NOTE — Anesthesia Preprocedure Evaluation (Signed)
Anesthesia Evaluation  ?Patient identified by MRN, date of birth, ID band ?Patient awake ? ? ? ?Reviewed: ?Allergy & Precautions, NPO status , Patient's Chart, lab work & pertinent test results ? ?Airway ?Mallampati: II ? ?TM Distance: >3 FB ?Neck ROM: Full ? ? ? Dental ?no notable dental hx. ? ?  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  ?breath sounds clear to auscultation ?+ decreased breath sounds ? ? ? ? ? Cardiovascular ?hypertension, Pt. on medications ?Normal cardiovascular exam ?Rhythm:Regular Rate:Normal ? ? ?  ?Neuro/Psych ?negative neurological ROS ? negative psych ROS  ? GI/Hepatic ?Neg liver ROS, Cachectic in appearance ?  ?Endo/Other  ?negative endocrine ROS ? Renal/GU ?negative Renal ROS  ?negative genitourinary ?  ?Musculoskeletal ?negative musculoskeletal ROS ?(+)  ? Abdominal ?  ?Peds ?negative pediatric ROS ?(+)  Hematology ? ?(+) Blood dyscrasia, anemia ,   ?Anesthesia Other Findings ? ? Reproductive/Obstetrics ?negative OB ROS ? ?  ? ? ? ? ? ? ? ? ? ? ? ? ? ?  ?  ? ? ? ? ? ? ? ? ?Anesthesia Physical ?Anesthesia Plan ? ?ASA: 4 ? ?Anesthesia Plan: General  ? ?Post-op Pain Management: Minimal or no pain anticipated  ? ?Induction: Intravenous ? ?PONV Risk Score and Plan: 3 and Ondansetron, Dexamethasone and Treatment may vary due to age or medical condition ? ?Airway Management Planned: Oral ETT ? ?Additional Equipment:  ? ?Intra-op Plan:  ? ?Post-operative Plan: Extubation in OR ? ?Informed Consent: I have reviewed the patients History and Physical, chart, labs and discussed the procedure including the risks, benefits and alternatives for the proposed anesthesia with the patient or authorized representative who has indicated his/her understanding and acceptance.  ? ? ? ?Dental advisory given ? ?Plan Discussed with: CRNA and Surgeon ? ?Anesthesia Plan Comments:   ? ? ? ? ? ? ?Anesthesia Quick Evaluation ? ?

## 2021-04-20 NOTE — Discharge Instructions (Addendum)
High Calorie, High Protein Nutrition Therapy ? ?A high-calorie, high-protein diet has been recommended to you. Your registered dietitian nutritionist (RDN) may have recommended this diet because you are having difficulty eating enough calories throughout the day, you have lost weight, and/or you need to add protein to your diet. ? ?Sometimes you may not feel like eating, even if you know the importance of good nutrition. The recommendations in this handout can help you with the following: ? ?Regaining your strength and energy ?Keeping your body healthy ?Healing and recovering from surgery or illness and fighting infection ? ?Schedule Your Meals and Snacks ? ?Several small meals and snacks are often better tolerated and digested than large meals. ? ?Strategies ? ?Plan to eat 3 meals and 3 snacks daily. ?Experiment with timing meals to find out when you have a larger appetite. ?Appetite may be greatest in the morning after not eating all night so you may prefer to eat your larger meals and snacks in the morning and at lunch. ?Breakfast-type foods are often better tolerated so eat foods such as eggs, pancakes, waffles and cereal for any meal or snack. ?Carry snacks with you so you are prepared to eat every 2 to 3 hours. ?Determine what works best for you if your body?s cues for feeling hungry or full are not working. ?Eat a small meal or snack even if you don?t feel hungry. ?Set a timer to remind you when it is time to eat. ?Take a walk before you eat (with health care provider?s approval). ?Light or moderate physical activity can help you maintain muscle and increase your appetite. ? ?Make Eating Enjoyable ? ?Taking steps to make the experience enjoyable may help to increase your interest in eating and improve your appetite. ? ?Strategies: ? ?Eat with others whenever possible. ?Include your favorite foods to make meals more enjoyable. ?Try new foods. ?Save your beverage for the end of the meal so that you have more  room for food before you get full. ? ?Add Calories to Your Meals and Snacks ? ?Try adding calorie-dense foods so that each bite provides more nutrition. ? ?Strategies ? ?Drink milk, chocolate milk, soy milk, or smoothies instead of low-calorie beverages such as diet drinks or water. ?Cook with milk or soy milk instead of water when making dishes such as hot cereal, cocoa, or pudding. ?Add jelly, jam, honey, butter or margarine to bread and crackers. Add jam or fruit to ice cream and as a topping over cake. ?Mix dried fruit, nuts, granola, honey, or dry cereal with yogurt or hot cereals. ?Enjoy snacks such as milkshakes, smoothies, pudding, ice cream, or custard. ?Blend a fruit smoothie of a banana, frozen berries, milk or soy milk, and 1 tablespoon nonfat powdered milk or protein powder. ? ?Add Protein to Your Meals and Snacks ? ?Choose at least one protein food at each meal and snack to increase your daily intake. ? ?Strategies ? ?Add ? cup nonfat dry milk powder or protein powder to make a high-protein milk to drink or to use in recipes that call for milk. Vanilla or peppermint extract or unsweetened cocoa powder could help to boost the flavor. ?Add hard-cooked eggs, leftover meat, grated cheese, canned beans or tofu to noodles, rice, salads, sandwiches, soups, casseroles, pasta, tuna and other mixed dishes. ?Add powdered milk or protein powder to hot cereals, meatloaf, casseroles, scrambled eggs, sauces, cream soups, and shakes. ?Add beans and lentils to salads, soups, casseroles, and vegetable dishes. ?Eat cottage cheese or yogurt, especially  [place] yogurt, with fruit as a snack or dessert. ?Eat peanut or other nut butters on crackers, bread, toast, waffles, apples, bananas or celery sticks. Add it to milkshakes, smoothies, or desserts. ?Consider a ready-made protein shake.  ? ?Add Fats to Your Meals and Snacks ? ?Try adding fats to your meals and snacks. Fat provides more calories in fewer bites than  carbohydrate or protein and adds flavors to your foods. ? ?Strategies ? ?Snack on nuts and seeds or add them to foods like salads, pasta, cereals, yogurt, and ice cream.  ?Saut? or stir-fry vegetables, meats, chicken, fish or tofu in olive or canola oil.  ?Add olive oil, other vegetable oils, butter or margarine to soups, vegetables, potatoes, cooked cereal, rice, pasta, bread, crackers, pancakes, or waffles. ?Snack on olives or add to pasta, pizza, or salad. ?Add avocado or guacamole to your salads, sandwiches, and other entrees. ?Include fatty fish such as salmon in your weekly meal plan. ? ?Tips For Adding Protein Nutrition Therapy  ? ? ?Tips ?Add extra egg to one or more meals  ?Increase the portion of milk to drink and change to skim milk if able  ?Include [place] yogurt or cottage cheese for snack or part of a meal  ?Increase portion size of protein entr?e and decrease portion of starch/bread  ?Mix protein powder, nut butter, almond/nut milk, non-fat dry milk, or Greek yogurt to shakes and smoothies  ?Use these ingredients also in baked goods or other recipes ?Use double the amount of sandwich filling  ?Add protein foods to all snacks including cheese, nut butters, milk and yogurt ? ?Food Tips for Including Protein  ?Beans Cook and use dried peas, beans, and tofu in soups or add to casseroles, pastas, and grain dishes that also contain cheese or meat  ?Mash with cheese and milk  ?Use tofu to make smoothies  ?Commercial Protein Supplements Use nutritional supplements or protein powder sold at pharmacies and grocery stores  ?Use protein powder in milk drinks and desserts, such as pudding  ?Mix with ice cream, milk, and fruit or other flavorings for a high-protein milkshake  ?Cottage Cheese or [employer] Mix with or use to stuff fruits and vegetables  ?Add to casseroles, spaghetti, noodles, or egg dishes such as omelets, scrambled eggs, and souffl?s  ?Use gelatin, pudding-type desserts, cheesecake, and pancake  or waffle batter  ?Use to stuff crepes, pasta shells, or manicotti  ?Puree and use as a substitute for sour cream  ?Eggs, Egg whites, and Egg Yolks Add chopped, hard-cooked eggs to salads and dressings, vegetables, casseroles, and creamed meats  ?Beat eggs into mashed potatoes, vegetable purees, and sauces  ?Add extra egg whites to quiches, scrambled eggs, custards, puddings, pancake batter, or [place] toast wash/batter  ?Make a rich custard with egg yolks, double strength milk, and sugar  ?Add extra hard-cooked yolks to deviled egg filling and sandwich spreads  ?Hard or Semi-Soft Cheese (Cheddar, Sherley Bounds) Melt on sandwiches, bread, muffins, tortillas, hamburgers, hot dogs, other meats or fish, vegetables, eggs, or desserts such as stewed figs or pies  ?Grate and add to soups, sauces, casseroles, vegetable dishes, potatoes, rice noodles, or meatloaf  ?Serve as a snack with crackers or bagels  ?Ice cream, Yogurt, and Frozen Yogurt Add to milk drinks such as milkshakes  ?Add to cereals, fruits, gelatin desserts, and pies  ?Blend or whip with soft or cooked fruits  ?Sandwich ice cream or frozen yogurt between enriched cake slices, cookies, or graham crackers  ?Use seasoned  yogurt as a dip for fruits, vegetables, or chips  ?Use yogurt in place of sour cream in casseroles  ?Meat and Fish Add chopped, cooked meat or fish to vegetables, salads, casseroles, soups, sauces, and biscuit dough  ?Use in omelets, souffl?s, quiches, and sandwich fillings  ?Add chicken and [place] to stuffing  ?Wrap in pie crust or biscuit dough as turnovers  ?Add to stuffed baked potatoes  ?Add pureed meat to soups  ?Milk Use in beverages and in cooking  ?Use in preparing foods, such as hot cereal, soups, cocoa, or pudding  ?Add cream sauces to vegetable and other dishes  ?Use evaporated milk, evaporated skim milk, or sweetened condensed milk instead of milk or water in recipes.  ?Nonfat Dry Milk Add 1/3 cup of nonfat dry milk powdered milk to  each cup of regular milk for ?double strength? milk  ?Add to yogurt and milk drinks, such as pasteurized eggnog and milkshakes  ?Add to scrambled eggs and mashed potatoes  ?Use in casseroles, meatloaf, hot cereal,

## 2021-04-20 NOTE — Progress Notes (Addendum)
?PROGRESS NOTE ? ? ? ?Anna Finley  SFK:[id number] DOB: [birth year] DOA: 04/18/2021 ?PCP: [name], MD  ? ? ?Brief Narrative:  ?Anna Finley is a 76 y.o. female with past medical history of hypertension, COPD and recent diagnosis of lung cancer but no biopsy yet who had seen I card on 04/14/2021 and his clinic with plans for PET scan presented to hospital with the shoulder upper neck pain and difficulty ambulating for 2 weeks.  Patient complained of a pain on the back when she coughed and had gone to her primary care physician who had done a CT scan of the chest which showed 6 cm right lung mass with postobstructive pneumonia.  Patient was supposed to get PET scan on 413 and in a biopsy on 417 by pulmonary.  Patient however reported having significant weight loss hemoptysis with worsening cough and due to these new findings of weakness ambulation patient has been considered for admission to hospital for further evaluation. ?  ?Assessment and Plan: ?Principal Problem: ?  Spinal cord compression ([place]) ?Active Problems: ?  Malignant neoplasm of lower lobe of right lung ([place]) ?  COPD (chronic obstructive pulmonary disease) with chronic bronchitis ([place]) ?  HTN (hypertension) ?  Hypokalemia ?  ?* Spinal cord compression ([place]) ?Patient presenting with bilateral lower extremity weakness and difficulty ambulating.  MRI cervical scan showed a severe pathological compression fraction of T3 with bulky enhancing tumor in the upper thoracic spinal canal with spinal cord compression and edema.  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  Neurosurgery has been consulted and patient has been transferred to [facility] long hospital for radiation oncology evaluation.  Thoracic and lumbar MRI pending due to previous contrast.  Will need to follow radiation oncology and neurosurgery recommendation.  Plan for simulation today. ? ?Back pain neck pain.  On morphine and oxycodone for pain management.  Plan for radiation  treatment..  Plan for radiation treatment. ? ?Malignant neoplasm of lower lobe of right lung ([place]) ?Patient with recent diagnosis of presumed lung cancer.  Supposed to undergo PET scan and biopsy as outpatient.  Plan for endoscopic ultrasound/EBUS today by pulmonary while in the hospital for tissue biopsy. ? ?HTN (hypertension) ?We will resume Tenoretic at home after procedure. ? ?COPD (chronic obstructive pulmonary disease) with chronic bronchitis ([place]) ?-Continue home Singulair, [place] ([place] per formulary).  Continue as needed albuterol. ? ?Hypokalemia.  Improved after replacement.  Check levels in AM. ? ?Significant weight loss/failure to thrive.  Likely secondary to malignancy.  Nutrition services has been consulted. ? ? ? DVT prophylaxis: SCDs Start: 04/19/21 0842 ? ? ?Code Status:   ?  Code Status: Full Code ? ?Disposition: Home ? ?Status is: Inpatient ? ?Remains inpatient appropriate because: Need for simulation, EBUS, tissue biopsy ? ? Family Communication: Spoke with the patient's granddaughter at bedside ? ?Consultants:  ?PCCM ?Radiation oncology ?Neurosurgery ? ?Procedures:  ?None ? ?Antimicrobials:  ?None ? ?Anti-infectives (From admission, onward)  ? ? None  ? ?  ? ?Subjective: ?Today, patient was seen and examined at bedside.  Complains of mild neck area pain and Cough.  Patient's granddaughter at bedside. ? ?Objective: ?Vitals:  ? 04/19/21 1421 04/19/21 1831 04/19/21 2218 04/20/21 0228  ?BP: 105/62 126/81 139/72 (!) 163/80  ?Pulse: 71 84 85 76  ?Resp: 14 14 14 14   ?Temp: 98.5 ?F (36.9 ?C) 98 ?F (36.7 ?C) 98.5 ?F (36.9 ?C) 97.8 ?F (36.6 ?C)  ?TempSrc: Oral  Oral Oral  ?SpO2: 97% 96% 96% 97%  ?  Weight: 43.4 kg     ?Height: 5\' 5"  (1.651 m)     ? ? ?Intake/Output Summary (Last 24 hours) at 04/20/2021 1024 ?Last data filed at 04/19/2021 1754 ?Gross per 24 hour  ?Intake 240 ml  ?Output 300 ml  ?Net -60 ml  ? ?Filed Weights  ? 04/18/21 2258 04/19/21 1421  ?Weight: 43.1 kg 43.4 kg  ? ? ?Physical  Examination: ?Body mass index is 15.93 kg/m?.  ? ?General: Thinly built, not in obvious distress, appears cachectic  ?HENT:   No scleral pallor or icterus noted. Oral mucosa is moist.  ?Chest:   Diminished breath sounds bilaterally.  Tenderness on palpation of the right posterior shoulder and thoracic spine. ?CVS: S1 &S2 heard. No murmur.  Regular rate and rhythm. ?Abdomen: Soft, nontender, nondistended.  Bowel sounds are heard.   ?Extremities: No cyanosis, clubbing or edema.  Peripheral pulses are palpable.  Muscle mass loss. ?Psych: Alert, awake and oriented, normal mood ?CNS:  No cranial nerve deficits.  Generalized weakness, moves all extremities. ?Skin: Warm and dry.  No rashes noted. ? ?Data Reviewed:  ? ?CBC: ?Recent Labs  ?Lab 04/18/21 ?2336 04/20/21 ?8119  ?WBC 9.7 7.6  ?NEUTROABS 6.3  --   ?HGB 9.9* 10.6*  ?HCT 29.9* 32.5*  ?MCV 84.9 86.2  ?PLT 376 386  ? ? ?Basic Metabolic Panel: ?Recent Labs  ?Lab 04/18/21 ?2336 04/20/21 ?1478  ?NA 134* 133*  ?K 3.4* 4.2  ?CL 99 97*  ?CO2 26 28  ?GLUCOSE 135* 150*  ?BUN 20 20  ?CREATININE 0.91 0.64  ?CALCIUM 8.8* 9.0  ? ? ?Liver Function Tests: ?No results for input(s): AST, ALT, ALKPHOS, BILITOT, PROT, ALBUMIN in the last 168 hours. ? ? ?Radiology Studies: ?CT Head Wo Contrast ? ?Result Date: 04/19/2021 ?CLINICAL DATA:  Ataxia.  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 EXAM: CT HEAD WITHOUT CONTRAST TECHNIQUE: Contiguous axial images were obtained from the base of the skull through the vertex without intravenous contrast. RADIATION DOSE REDUCTION: This exam was performed according to the departmental dose-optimization program which includes automated exposure control, adjustment of the mA and/or kV according to patient size and/or use of iterative reconstruction technique. COMPARISON:  04/16/2011 FINDINGS: Brain: Diffuse cerebral atrophy. Ventricular dilatation consistent with central atrophy. Low-attenuation changes in the deep white matter consistent with small vessel ischemia. No  abnormal extra-axial fluid collections. No mass effect or midline shift. Gray-white matter junctions are distinct. Basal cisterns are not effaced. No acute intracranial hemorrhage. No focal lesions are identified that might indicate metastatic disease. However, MRI is more sensitive for evaluation of metastatic lesions. Vascular: Intracranial arterial vascular calcifications. Skull: Calvarium appears intact. No acute depressed skull fractures. No focal bone lesions. Sinuses/Orbits: Paranasal sinuses demonstrate opacification of the sphenoid sinuses. Mastoid air cells are clear. 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 IMPRESSION: 1. No acute intracranial abnormalities. Chronic atrophy and small vessel ischemic changes. 2. No focal intracranial lesions are demonstrated. However, MRI is more sensitive for evaluation of intracranial metastatic disease. Electronically Signed   By: Lucienne Capers M.D.   On: 04/19/2021 00:04  ? ?MR BRAIN W WO CONTRAST ? ?Result Date: 04/19/2021 ?CLINICAL DATA:  76 year old female diagnosed last month with suspicious right lower lobe mass. Ataxia. EXAM: MRI HEAD WITHOUT AND WITH CONTRAST TECHNIQUE: Multiplanar, multiecho pulse sequences of the brain and surrounding structures were obtained without and with intravenous contrast. CONTRAST:  4.49mL GADAVIST GADOBUTROL 1 MMOL/ML IV SOLN COMPARISON:  Head CT without contrast yesterday. [place]  [facility] Neck CT 04/19/2017. FINDINGS: Brain: No restricted diffusion to suggest acute infarction. No midline shift, mass effect, evidence of mass lesion, ventriculomegaly, extra-axial collection or acute intracranial hemorrhage. Cervicomedullary junction and pituitary are within normal limits. No abnormal intracranial enhancement or dural thickening identified. Patchy and confluent widespread bilateral cerebral white matter T2 and FLAIR hyperintensity. Moderate similar T2 heterogeneity  throughout the bilateral deep gray matter nuclei. Comparatively mild T2 heterogeneity in the pons. Chronic microhemorrhage in the right thalamus. No cortical encephalomalacia identified. Vascular: Major intracranial vascul

## 2021-04-20 NOTE — Progress Notes (Signed)
04/20/2021 ? ? ?I have seen and evaluated the patient for lung mass. ? ?S:  ?No events. ?Granddaughter in room. ?Slept okay. ?Pain under control if she sits still. ? ?O: ?Blood pressure (!) 163/80, pulse 76, temperature 97.8 ?F (36.6 ?C), temperature source Oral, resp. rate 14, height 5\' 5"  (1.651 m), weight 43.4 kg, SpO2 97 %.  ?No distress ?+temporal wasting ?Moves all 4 ext to command ?Heart sounds regular, ext warm ?Breath sounds diminished at bases ?AOx3 ? ?A/P ?Lung cancer with mets to spine causing pathologic T3 fracture. ?- Bronch/EBUS today, patient and granddaughter understand risk/benefits ?- Simulation per radOnc today ?- Will need MedOnc once tissue path comes back ?- Patient updated and agrees with plan ?- Pain control per primary ?- Continue steroids ?- Will follow ? ? ?Erskine Emery MD ?[facility] ?Prefer epic messenger for cross cover needs ?If after hours, please call E-link ? ?

## 2021-04-21 ENCOUNTER — Inpatient Hospital Stay (HOSPITAL_COMMUNITY): Payer: Medicare Other

## 2021-04-21 ENCOUNTER — Encounter (HOSPITAL_COMMUNITY): Payer: Self-pay | Admitting: Internal Medicine

## 2021-04-21 ENCOUNTER — Ambulatory Visit
Admit: 2021-04-21 | Discharge: 2021-04-21 | Disposition: A | Discharge status: Home / Self Care | Payer: Medicare Other | Attending: Radiation Oncology | Admitting: Radiation Oncology

## 2021-04-21 DIAGNOSIS — C7951 Secondary malignant neoplasm of bone: Principal | ICD-10-CM

## 2021-04-21 DIAGNOSIS — G9520 Unspecified cord compression: Secondary | ICD-10-CM | POA: Diagnosis not present

## 2021-04-21 DIAGNOSIS — Z87891 Personal history of nicotine dependence: Secondary | ICD-10-CM

## 2021-04-21 DIAGNOSIS — R930 Abnormal findings on diagnostic imaging of skull and head, not elsewhere classified: Secondary | ICD-10-CM

## 2021-04-21 DIAGNOSIS — G893 Neoplasm related pain (acute) (chronic): Secondary | ICD-10-CM

## 2021-04-21 DIAGNOSIS — I8290 Acute embolism and thrombosis of unspecified vein: Secondary | ICD-10-CM | POA: Insufficient documentation

## 2021-04-21 DIAGNOSIS — R042 Hemoptysis: Secondary | ICD-10-CM

## 2021-04-21 LAB — GLUCOSE, CAPILLARY
Glucose-Capillary: 176 mg/dL — ABNORMAL HIGH (ref 70–99)
Glucose-Capillary: 119 mg/dL — ABNORMAL HIGH (ref 70–99)
Glucose-Capillary: 154 mg/dL — ABNORMAL HIGH (ref 70–99)
Glucose-Capillary: 162 mg/dL — ABNORMAL HIGH (ref 70–99)

## 2021-04-21 LAB — BASIC METABOLIC PANEL
Anion gap: 9 (ref 5–15)
BUN: 22 mg/dL (ref 8–23)
CO2: 26 mmol/L (ref 22–32)
Calcium: 8.4 mg/dL — ABNORMAL LOW (ref 8.9–10.3)
Chloride: 98 mmol/L (ref 98–111)
Creatinine, Ser: 0.66 mg/dL (ref 0.44–1.00)
GFR, Estimated: >60 mL/min (ref >60)
Glucose, Bld: 149 mg/dL — ABNORMAL HIGH (ref 70–99)
Potassium: 4.2 mmol/L (ref 3.5–5.1)
Sodium: 133 mmol/L — ABNORMAL LOW (ref 135–145)

## 2021-04-21 LAB — CBC
HCT: 26.9 % — ABNORMAL LOW (ref 36.0–46.0)
Hemoglobin: 9.2 g/dL — ABNORMAL LOW (ref 12.0–15.0)
MCH: 29.5 pg (ref 26.0–34.0)
MCHC: 34.2 g/dL (ref 30.0–36.0)
MCV: 86.2 fL (ref 80.0–100.0)
Platelets: 334 10*3/uL (ref 150–400)
RBC: 3.12 MIL/uL — ABNORMAL LOW (ref 3.87–5.11)
RDW: 16.6 % — ABNORMAL HIGH (ref 11.5–15.5)
WBC: 11.1 10*3/uL — ABNORMAL HIGH (ref 4.0–10.5)
nRBC: 0 % (ref 0.0–0.2)

## 2021-04-21 LAB — MAGNESIUM: Magnesium: 1.8 mg/dL (ref 1.7–2.4)

## 2021-04-21 IMAGING — MR MR LUMBAR SPINE WO/W CM
4 of 7 series · 28 of 48 positions shown · IV contrast (gadavist)
Comparison: Cervical MRI from 2 days ago.

CLINICAL DATA: Evaluate for cord compression. Metastatic disease
with inability to walk.

EXAM:
MRI THORACIC AND LUMBAR SPINE WITHOUT AND WITH CONTRAST
TECHNIQUE: Multiplanar and multiecho pulse sequences of the thoracic and lumbar
spine were obtained without and with intravenous contrast.
CONTRAST:  4mL GADAVIST GADOBUTROL 1 MMOL/ML IV SOLN

[Series 17: T1 · sagittal · 4.0mm · 0.81mm/px · 3 of 14 slices shown (1 of 2)]
[im 1/14]
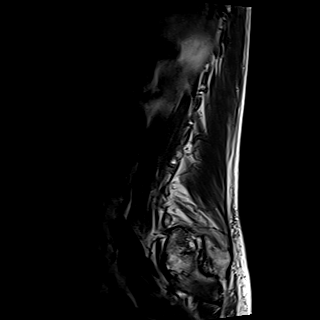
[im 7/14]
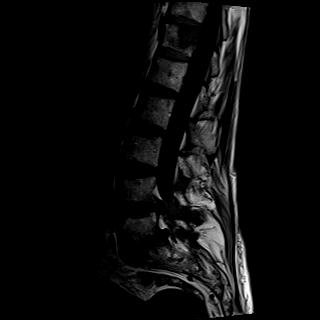
[im 14/14]
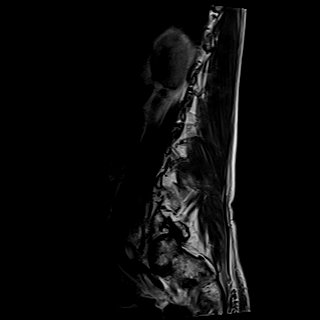

[Series 19: T2 · axial · 4.0mm · 0.62mm/px · z∈[-614,-414]mm · 11 of 38 slices shown]
[im 1/38]
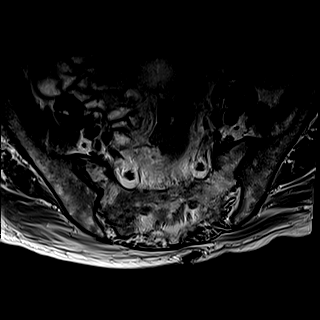
[im 4/38]
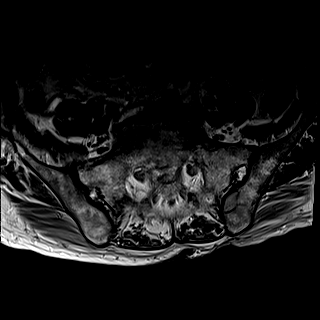
[im 8/38]
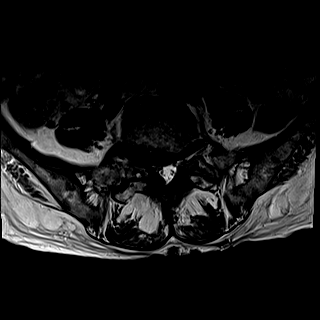
[im 12/38]
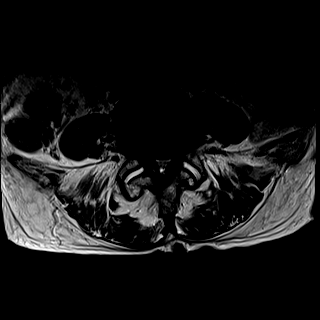
[im 15/38]
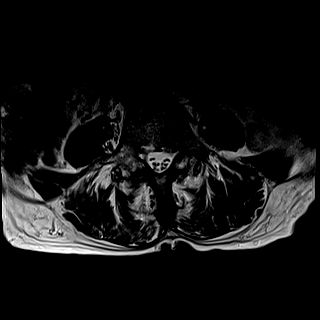
[im 19/38]
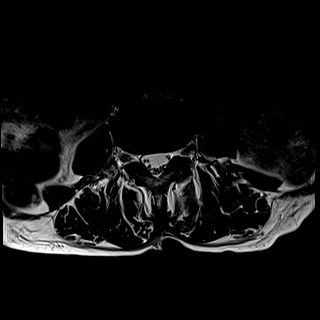
[im 23/38]
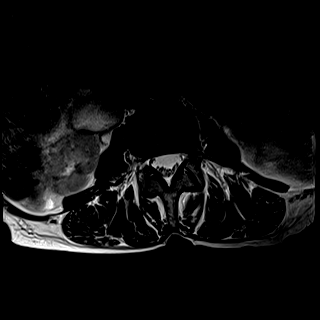
[im 26/38]
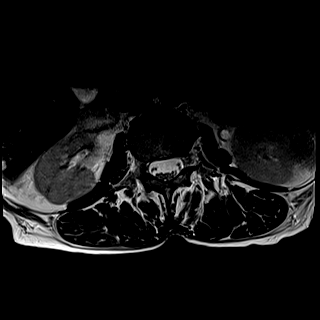
[im 30/38]
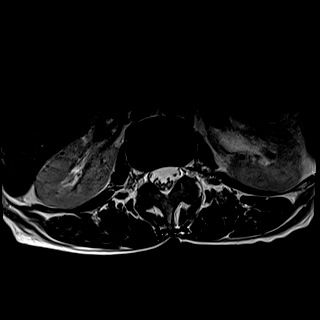
[im 34/38]
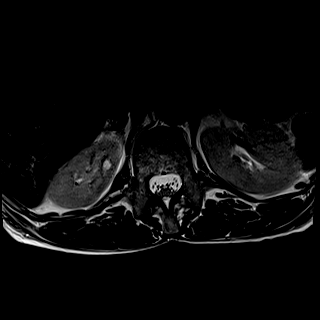
[im 38/38]
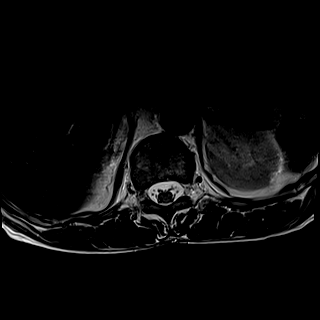

[Series 20: T1 · axial · 4.0mm · 0.39mm/px · z∈[-614,-434]mm · 10 of 38 slices shown (2 of 2)]
[im 1/38]
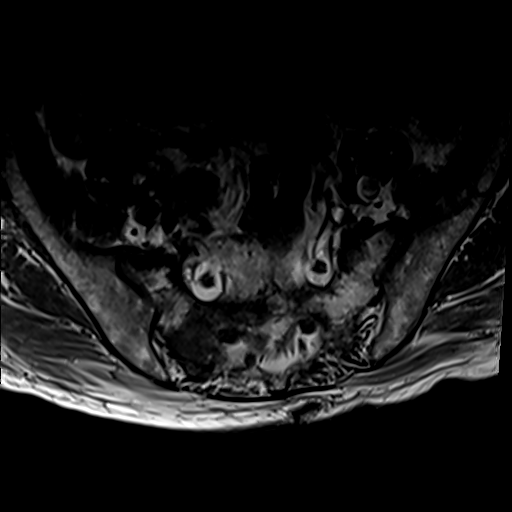
[im 4/38]
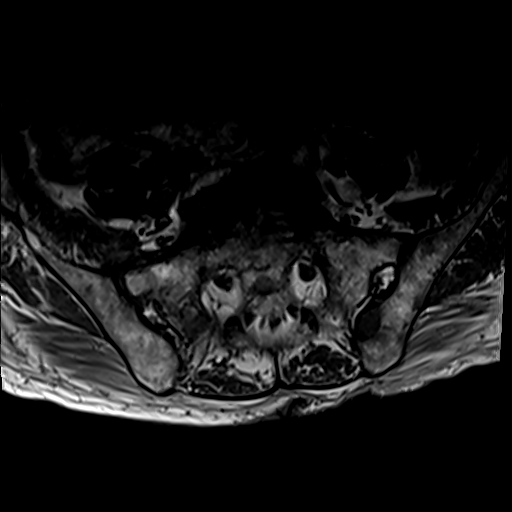
[im 8/38]
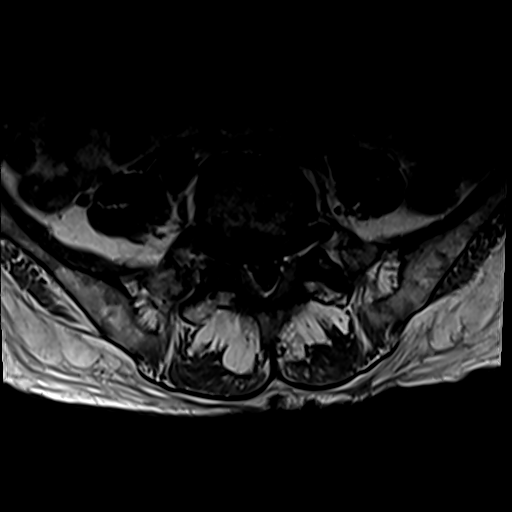
[im 12/38]
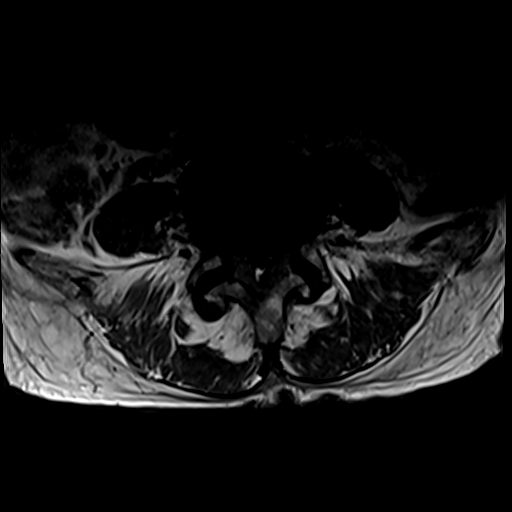
[im 15/38]
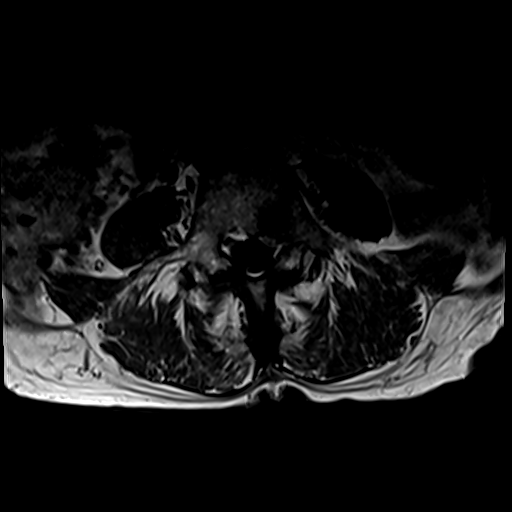
[im 19/38]
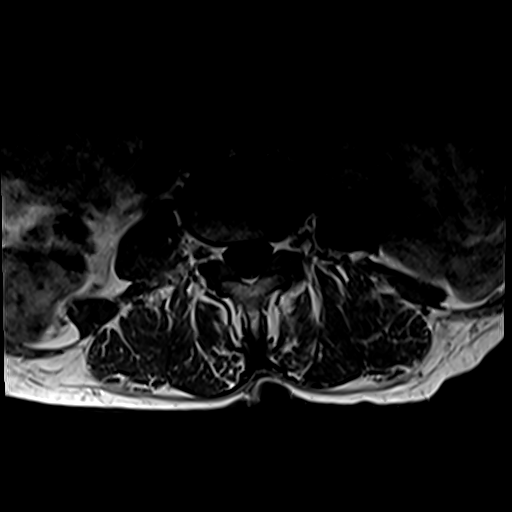
[im 23/38]
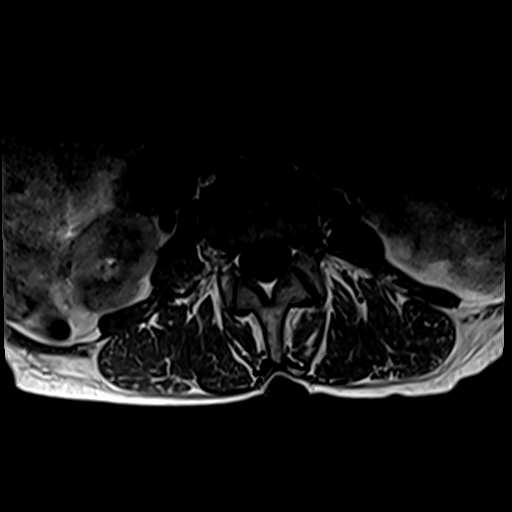
[im 26/38]
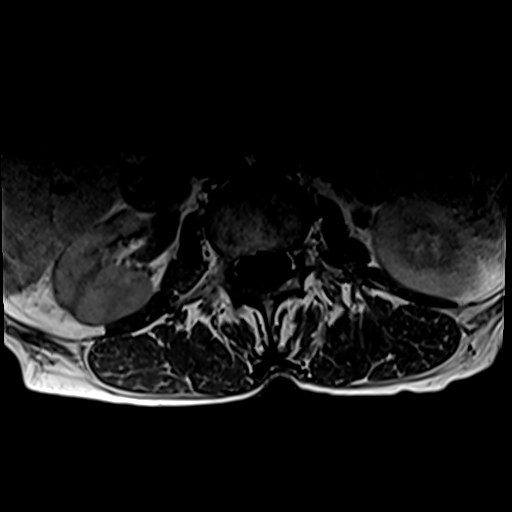
[im 30/38]
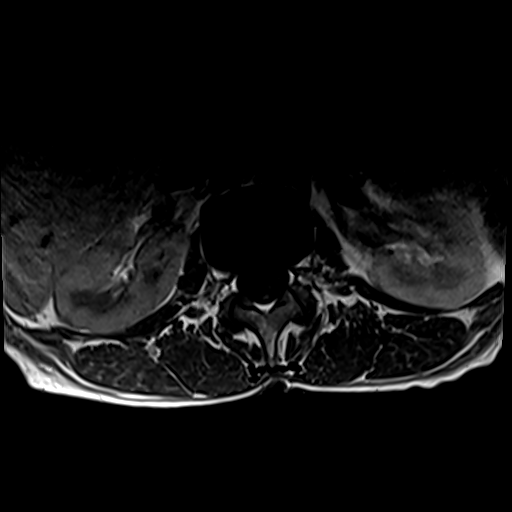
[im 34/38]
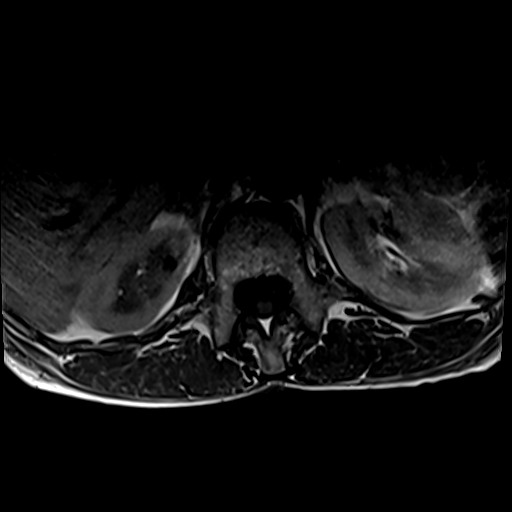

[Series 21: T2 post-contrast · sagittal · 4.0mm · 0.81mm/px · 4 of 14 slices shown]
[im 1/14]
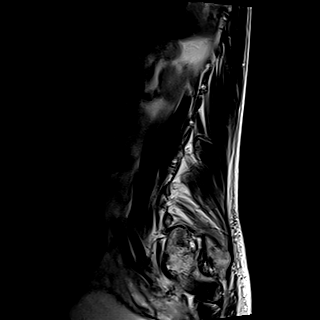
[im 5/14]
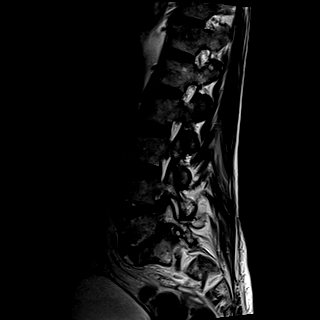
[im 9/14]
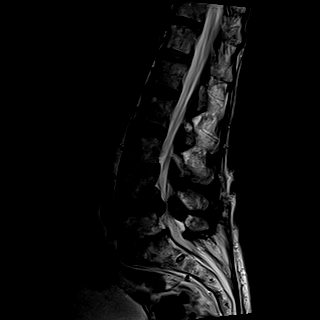
[im 14/14]
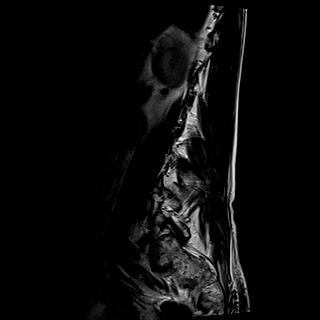

[28 of 48 positions shown; findings below may reference images not displayed]

FINDINGS: MRI THORACIC SPINE FINDINGS

Alignment:  Exaggerated thoracic kyphosis.  No listhesis

Vertebrae: Metastatic pattern. A large deposit at T3 diffusely
involves the vertebral body and left more than right posterior
elements, with epidural tumor extension compressing the cord and
infiltrating the left T3-4 foramen.

Additional notable deposit in the T12 body without fracture. T9
rounded signal abnormality is most likely hemangioma based on T1
signal characteristics. T9 right transverse process metastasis.
There is heterogeneity of marrow diffusely on T1 weighted imaging,
but without the enhancement or STIR signal accompanying other
metastases.

Cord:  Compression without detected edema at the level of T3.

Paraspinal and other soft tissues: Right lower lobe mass, reference
dedicated imaging of the body.

Disc levels:

No significant degenerative changes or degenerative impingement.

MRI LUMBAR SPINE FINDINGS

Segmentation:  5 lumbar type vertebrae

Alignment:  Slight anterolisthesis at L5-S1

Vertebrae: Infiltrating metastasis in the L4 posteroinferior body.
No epidural tumor seen. Heterogeneity of the sacrum with discrete
metastasis seen in the right ala, approximately 17 mm.

Conus medullaris: Extends to the L1 level and appears normal.

Paraspinal and other soft tissues: No perispinal mass or
inflammation.

Disc levels:

L4-L5: Disc narrowing and bulging. Degenerative facet spurring and
ligamentum flavum thickening. Bilateral joint effusions with
posterior synovial cysts/degenerative ganglia. Advanced spinal
stenosis. Biforaminal impingement

L5-S1:Disc narrowing and endplate degeneration with disc bulging and
ridging. Bilateral facet spurring. Biforaminal high-grade
impingement. Noncompressive right subarticular recess narrowing.
IMPRESSION: 1. Large metastatic deposit in the T3 vertebra with epidural tumor
compressing the cord and infiltrating the left T3-4 foramen.
2. Uncomplicated metastatic disease at T9, T12, L4, and the sacrum.
3. Advanced degenerative spinal stenosis at L4-5. Degenerative
bilateral foraminal impingement at L4-5 and especially L5-S1.

## 2021-04-21 IMAGING — CT CT ANGIO NECK
1 of 7 series · 7 of 33 positions shown · non-contrast
Comparison: None.

CLINICAL DATA: abnormal MRI, concern for jugular vein thrombosis vs
right carotid tumor or malignant LN.



[Series 7: ax thin · axial · 0.48mm/px · z∈[+1187,+1369]mm · 7 of 248 slices shown]
[im 31/248  soft-tissue]
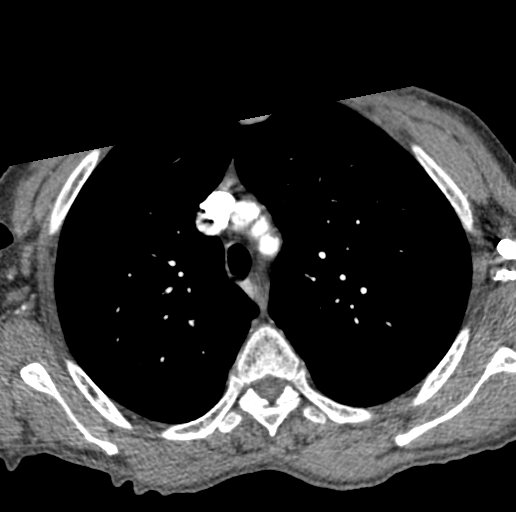
[im 62/248  bone]
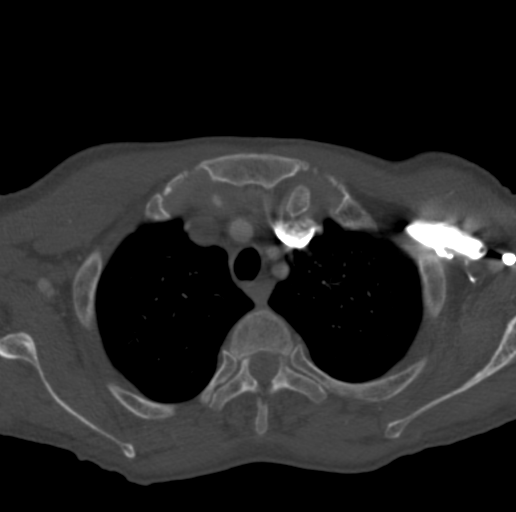
[im 93/248  soft-tissue]
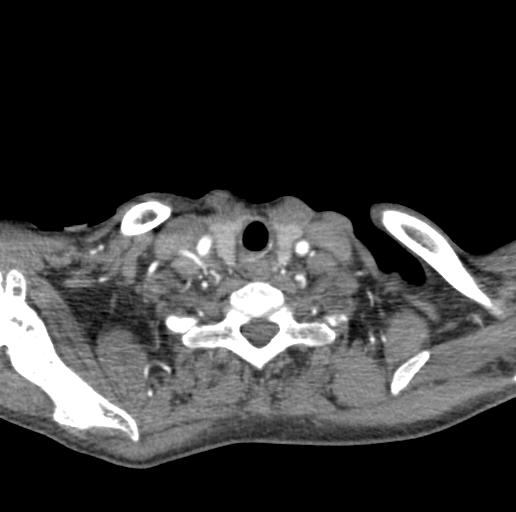
[im 124/248  bone]
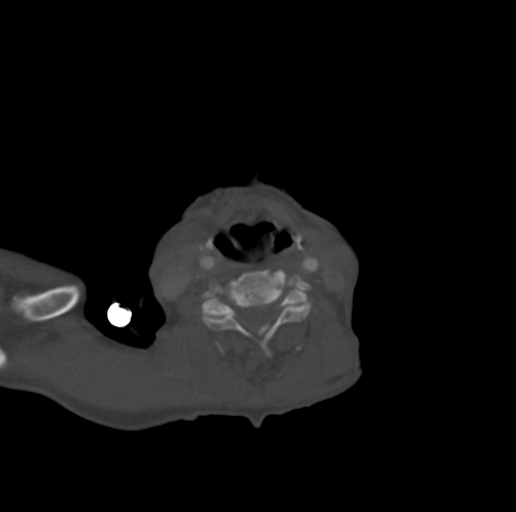
[im 155/248  soft-tissue]
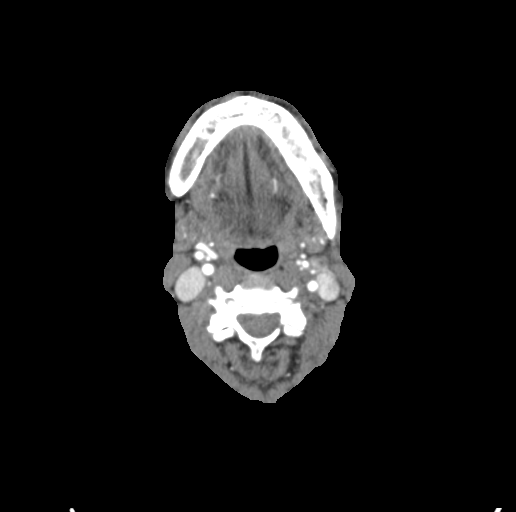
[im 186/248  bone]
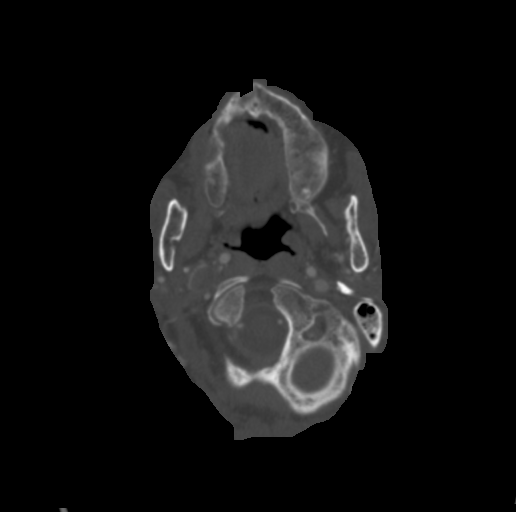
[im 217/248  soft-tissue]
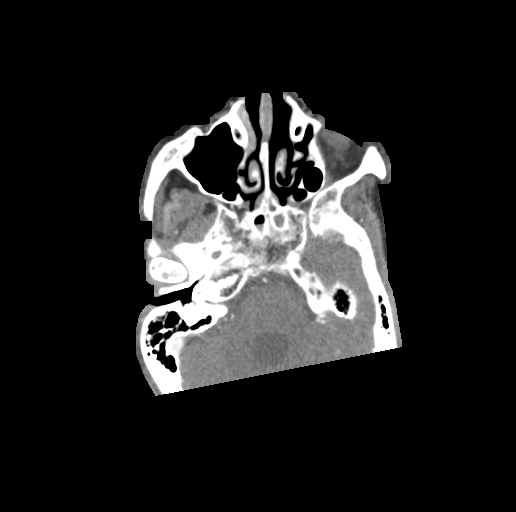

[7 of 33 positions shown; findings below may reference images not displayed]

RADIATION DOSE REDUCTION: This exam was performed according to the
departmental dose-optimization program which includes automated
exposure control, adjustment of the mA and/or kV according to
patient size and/or use of iterative reconstruction technique.

CONTRAST:  75mL OMNIPAQUE IOHEXOL 350 MG/ML SOLN
FINDINGS: Aortic arch: Standard branching. Imaged portion shows no evidence of
aneurysm or dissection. No significant stenosis of the major arch
vessel origins. Calcific aortic atherosclerosis.

Right carotid system: There is mild calcific atherosclerosis at the
carotid bifurcation. No stenosis.

Left carotid system: There is calcific atherosclerosis at the
carotid bifurcation without hemodynamically significant stenosis.

Vertebral arteries: Codominant.

Skeleton: Negative

Other neck: There is nonocclusive thrombus within a short segment of
the right internal jugular vein just inferior to the skull base.

Upper chest: Negative
IMPRESSION: 1. Bilateral carotid bifurcation atherosclerosis without
hemodynamically significant stenosis.
2. Nonocclusive short segment thrombus within the right internal
jugular vein just inferior to the skull base.

Aortic Atherosclerosis ([SR]-[SR]).

## 2021-04-21 IMAGING — MR MR THORACIC SPINE WO/W CM
8 of 9 series · 31 of 48 positions shown · IV contrast (gadavist)
Comparison: Cervical MRI from 2 days ago.

CLINICAL DATA: Evaluate for cord compression. Metastatic disease
with inability to walk.

EXAM:
MRI THORACIC AND LUMBAR SPINE WITHOUT AND WITH CONTRAST
TECHNIQUE: Multiplanar and multiecho pulse sequences of the thoracic and lumbar
spine were obtained without and with intravenous contrast.
CONTRAST:  4mL GADAVIST GADOBUTROL 1 MMOL/ML IV SOLN

[Series 16: T1 · sagittal · 4.0mm · 1.72mm/px · 1 of 5 slices shown (1 of 3)]
[im 1/5]
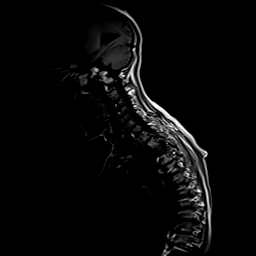

[Series 17: STIR · sagittal · 3.0mm · 1.00mm/px · 2 of 14 slices shown]
[im 1/14]
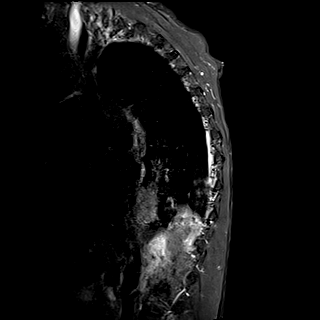
[im 14/14]
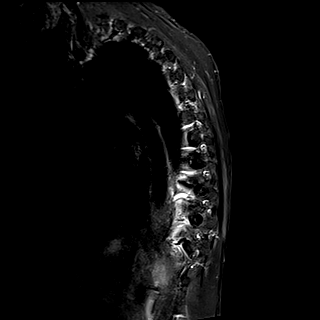

[Series 18: T1 · sagittal · 3.0mm · 1.00mm/px · 3 of 14 slices shown (2 of 3)]
[im 1/14]
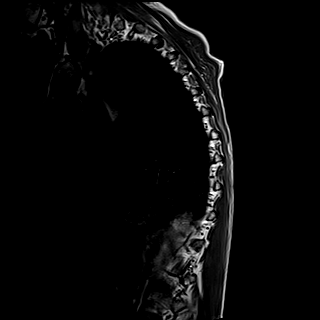
[im 7/14]
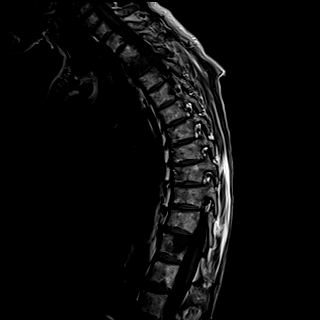
[im 14/14]
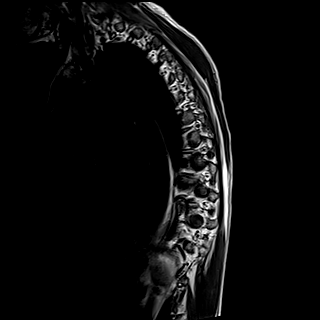

[Series 19: T2 · axial · 4.0mm · 0.78mm/px · z∈[-436,-263]mm · 9 of 43 slices shown]
[im 1/43]
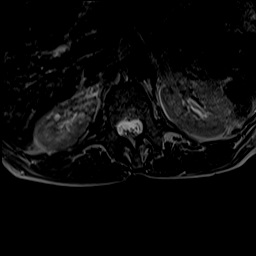
[im 6/43]
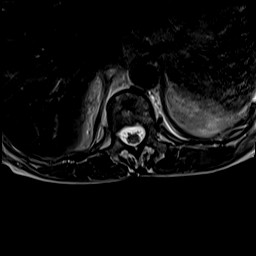
[im 11/43]
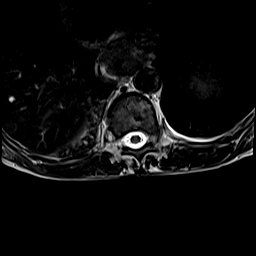
[im 16/43]
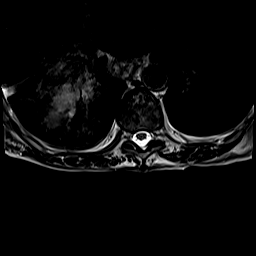
[im 22/43]
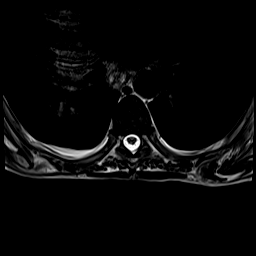
[im 27/43]
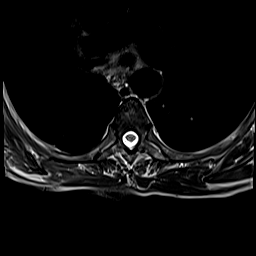
[im 32/43]
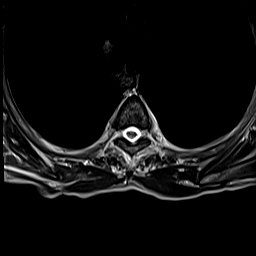
[im 37/43]
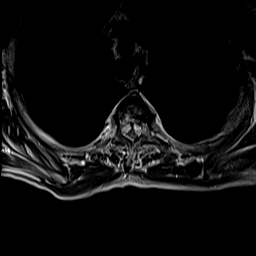
[im 43/43]
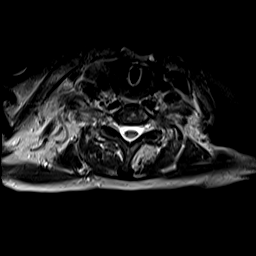

[Series 21: T1 · axial · 4.0mm · 0.39mm/px · z∈[-436,-263]mm · 8 of 43 slices shown (3 of 3)]
[im 1/43]
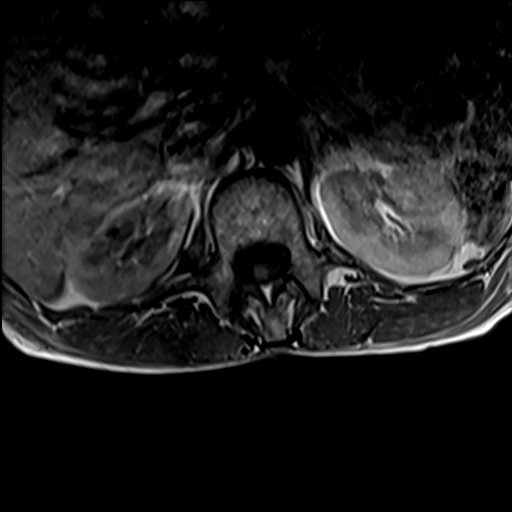
[im 6/43]
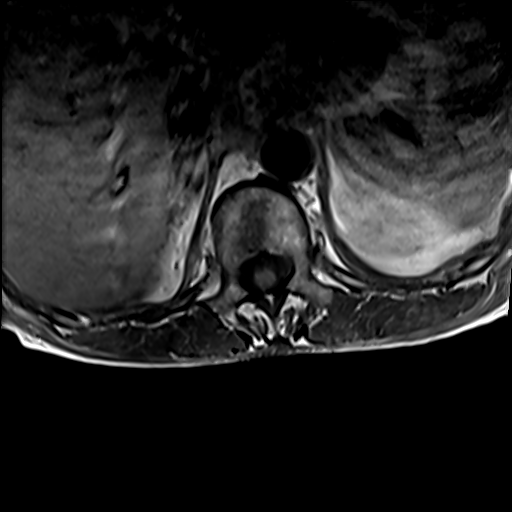
[im 11/43]
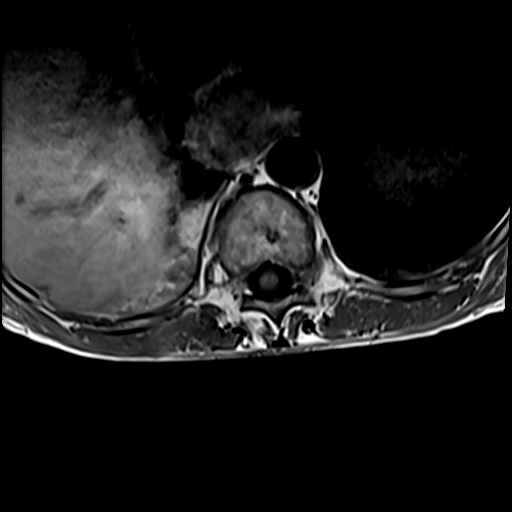
[im 16/43]
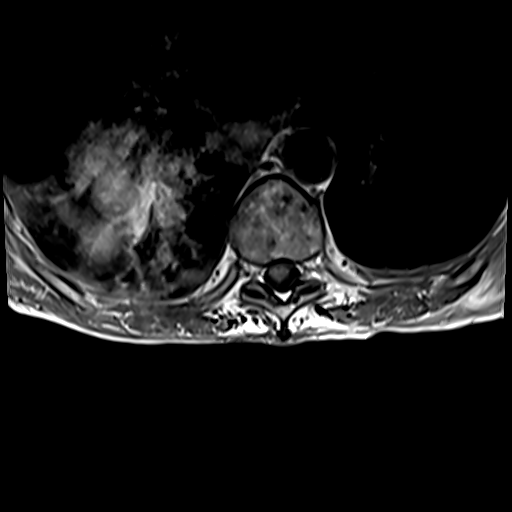
[im 27/43]
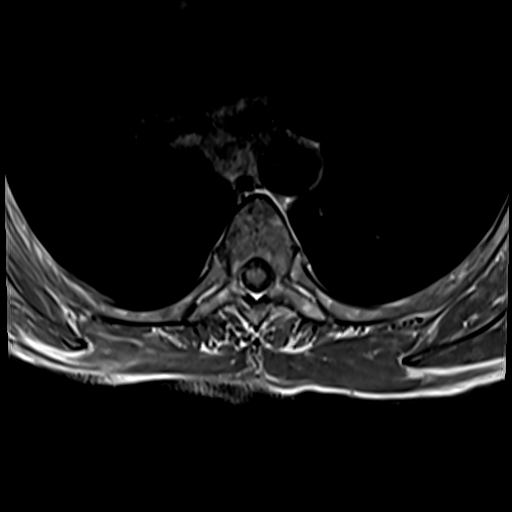
[im 32/43]
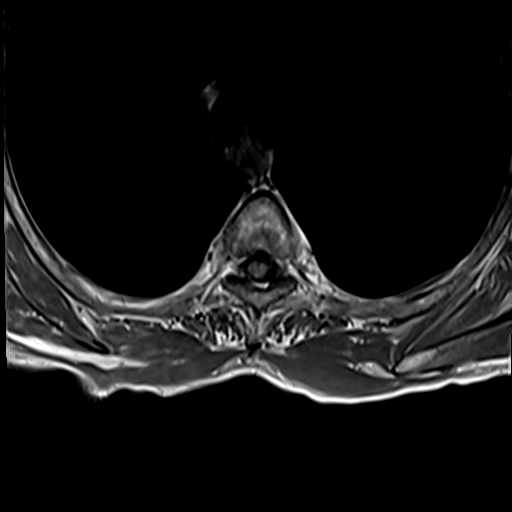
[im 37/43]
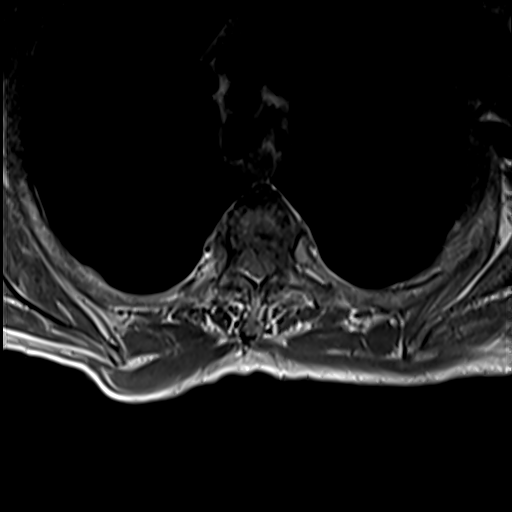
[im 43/43]
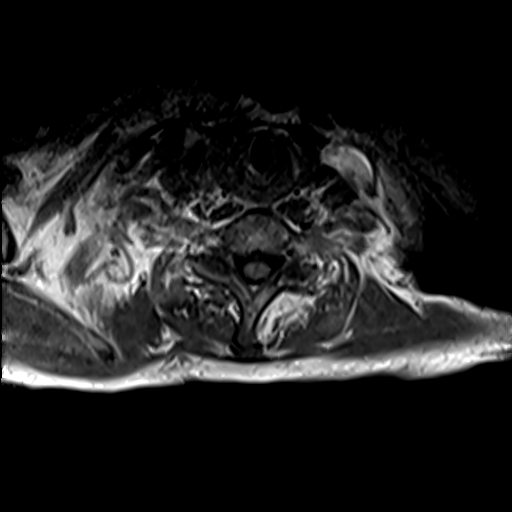

[Series 22: T2 post-contrast · sagittal · 3.0mm · 0.83mm/px · 3 of 14 slices shown]
[im 1/14]
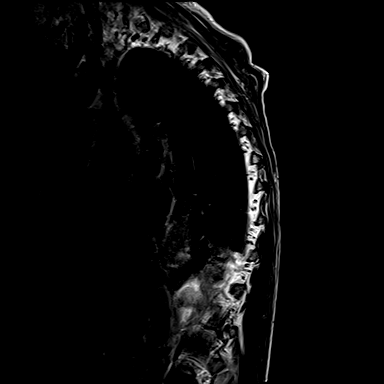
[im 7/14]
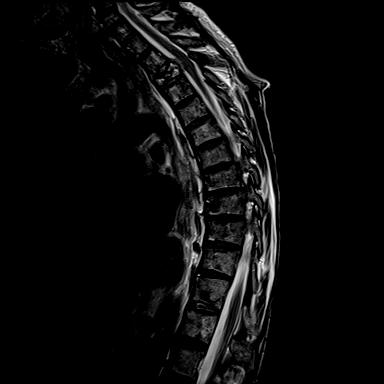
[im 14/14]
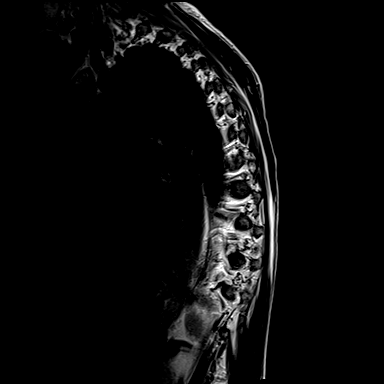

[Series 23: T1 fat-sat post-contrast · sagittal · 3.0mm · 1.00mm/px · 3 of 14 slices shown]
[im 1/14]
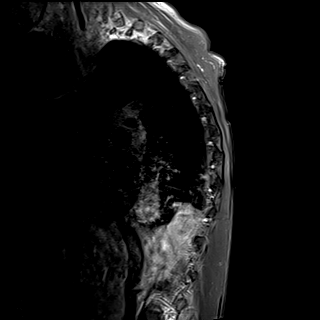
[im 7/14]
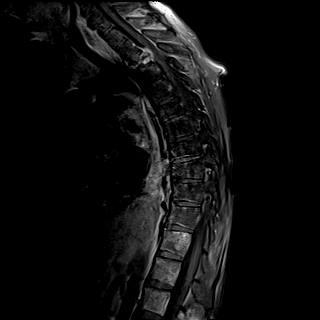
[im 14/14]
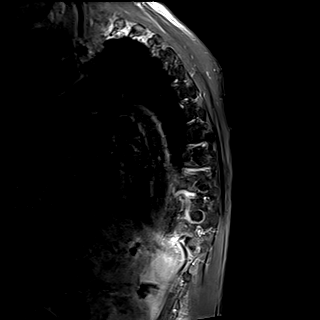

[Series 24: T1 post-contrast · axial · 4.0mm · 0.39mm/px · z∈[-436,-412]mm · 2 of 43 slices shown]
[im 1/43]
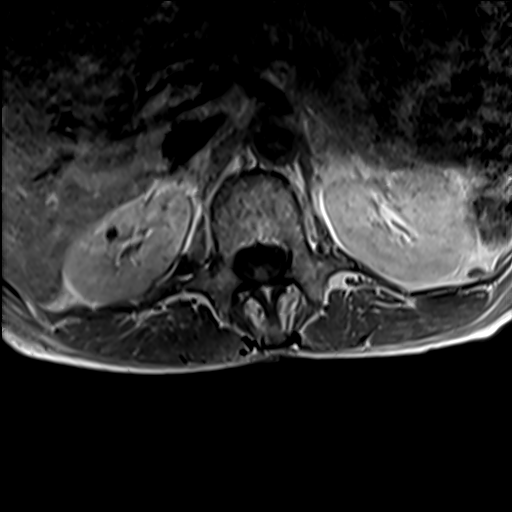
[im 6/43]
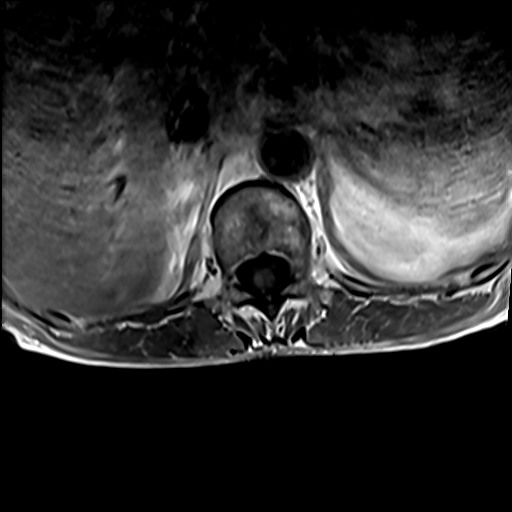

[31 of 48 positions shown; findings below may reference images not displayed]

FINDINGS: MRI THORACIC SPINE FINDINGS

Alignment:  Exaggerated thoracic kyphosis.  No listhesis

Vertebrae: Metastatic pattern. A large deposit at T3 diffusely
involves the vertebral body and left more than right posterior
elements, with epidural tumor extension compressing the cord and
infiltrating the left T3-4 foramen.

Additional notable deposit in the T12 body without fracture. T9
rounded signal abnormality is most likely hemangioma based on T1
signal characteristics. T9 right transverse process metastasis.
There is heterogeneity of marrow diffusely on T1 weighted imaging,
but without the enhancement or STIR signal accompanying other
metastases.

Cord:  Compression without detected edema at the level of T3.

Paraspinal and other soft tissues: Right lower lobe mass, reference
dedicated imaging of the body.

Disc levels:

No significant degenerative changes or degenerative impingement.

MRI LUMBAR SPINE FINDINGS

Segmentation:  5 lumbar type vertebrae

Alignment:  Slight anterolisthesis at L5-S1

Vertebrae: Infiltrating metastasis in the L4 posteroinferior body.
No epidural tumor seen. Heterogeneity of the sacrum with discrete
metastasis seen in the right ala, approximately 17 mm.

Conus medullaris: Extends to the L1 level and appears normal.

Paraspinal and other soft tissues: No perispinal mass or
inflammation.

Disc levels:

L4-L5: Disc narrowing and bulging. Degenerative facet spurring and
ligamentum flavum thickening. Bilateral joint effusions with
posterior synovial cysts/degenerative ganglia. Advanced spinal
stenosis. Biforaminal impingement

L5-S1:Disc narrowing and endplate degeneration with disc bulging and
ridging. Bilateral facet spurring. Biforaminal high-grade
impingement. Noncompressive right subarticular recess narrowing.
IMPRESSION: 1. Large metastatic deposit in the T3 vertebra with epidural tumor
compressing the cord and infiltrating the left T3-4 foramen.
2. Uncomplicated metastatic disease at T9, T12, L4, and the sacrum.
3. Advanced degenerative spinal stenosis at L4-5. Degenerative
bilateral foraminal impingement at L4-5 and especially L5-S1.

## 2021-04-21 MED ORDER — INSULIN ASPART 100 UNIT/ML IJ SOLN
0.0000 [IU] | Freq: Three times a day (TID) | INTRAMUSCULAR | Status: DC
  Administered 2021-04-21 – 2021-04-22 (×2): 2 [IU] via SUBCUTANEOUS
  Administered 2021-04-22: 1 [IU] via SUBCUTANEOUS
  Administered 2021-04-22: 3 [IU] via SUBCUTANEOUS
  Administered 2021-04-23 (×2): 1 [IU] via SUBCUTANEOUS
  Administered 2021-04-23: 2 [IU] via SUBCUTANEOUS
  Administered 2021-04-24: 1 [IU] via SUBCUTANEOUS
  Administered 2021-04-24: 2 [IU] via SUBCUTANEOUS
  Administered 2021-04-25: 1 [IU] via SUBCUTANEOUS
  Administered 2021-04-25 – 2021-04-26 (×3): 2 [IU] via SUBCUTANEOUS
  Administered 2021-04-26: 1 [IU] via SUBCUTANEOUS
  Administered 2021-04-26 – 2021-04-27 (×2): 2 [IU] via SUBCUTANEOUS
  Administered 2021-04-27: 1 [IU] via SUBCUTANEOUS

## 2021-04-21 MED ORDER — IPRATROPIUM-ALBUTEROL 0.5-2.5 (3) MG/3ML IN SOLN
3.0000 mL | Freq: Two times a day (BID) | RESPIRATORY_TRACT | Status: DC
  Administered 2021-04-21 – 2021-04-26 (×10): 3 mL via RESPIRATORY_TRACT
  Filled 2021-04-21 (×10): qty 3

## 2021-04-21 MED ORDER — ADULT MULTIVITAMIN W/MINERALS CH
1.0000 | ORAL_TABLET | Freq: Every day | ORAL | Status: DC
Start: 2021-04-21 — End: 2021-04-28
  Administered 2021-04-21 – 2021-04-27 (×7): 1 via ORAL
  Filled 2021-04-21 (×7): qty 1

## 2021-04-21 MED ORDER — IOHEXOL 350 MG/ML SOLN
75.0000 mL | Freq: Once | INTRAVENOUS | Status: AC | PRN
  Administered 2021-04-21: 75 mL via INTRAVENOUS

## 2021-04-21 MED ORDER — INSULIN ASPART 100 UNIT/ML IJ SOLN
0.0000 [IU] | Freq: Every day | INTRAMUSCULAR | Status: DC
  Administered 2021-04-23: 3 [IU] via SUBCUTANEOUS

## 2021-04-21 MED ORDER — GADOBUTROL 1 MMOL/ML IV SOLN
4.0000 mL | Freq: Once | INTRAVENOUS | Status: AC | PRN
  Administered 2021-04-21: 4 mL via INTRAVENOUS

## 2021-04-21 NOTE — Hospital Course (Addendum)
76 yo with hx HTN, COPD, and suspected lung cancer based on imaging who was planned for outpatient PET/biopsy presented in interim with difficulty ambulated and neck/shoulder pain.  Due to issues with ambulation, weight loss, hemoptysis she presented to the ED and was found to have metastatic deposit in T3 vertebrae with cord compression. ? ?Pulm was consulted and she's s/p bronch.  Cytology showing metastatic adenocarcinoma.  Rad onc was c/s and she's been started on radiation therapy and dexamethasone.  Evaluated by CIR, not thought to be good candidate.  Family wants her to come home.  Plan at this point for discharge with outpatient follow up with oncology, continued radiation therapy, dex taper.   ? ?See below for additional details ?

## 2021-04-21 NOTE — Consult Note (Signed)
? ? [place] ?Telephone:([phone]) [phone]   [phone] ? ?CONSULT NOTE ? ?REFERRING PHYSICIAN: Dr. Fayrene Helper ? ?REASON FOR CONSULTATION:  ?76 years old African-American female recently diagnosed with lung cancer ? ?HPI ?Anna Finley is a 76 y.o. female with past medical history significant for COPD, hypertension and history of smoking but quit 10 years ago.  The patient had CT scan of the chest on March 26, 2021 at [facility] because of suspicious mass seen on chest x-ray.  The scan showed complete occlusion of the right lower lobe bronchus secondary to 6.3 x 3.9 cm mass.  Distal to the mass within the right lower lobe there was a regular patchy consolidation and groundglass opacities suspicious for postobstructive pneumonitis or extension of disease.  There was also a 0.9 cm subsolid nodule within the right middle lobe and 0.5 cm nodule in the same lobe.  The scan also showed 1.2 cm subcarinal lymph node.  It is unclear to me if the patient has any additional work-up since that time but she presented to the emergency department at [facility] on 04/18/2021 complaining of inability to walk.  She also had intermittent left upper back and shoulder pain for 2 weeks.  She has shortness of breath and cough.  She was supposed to have a PET scan on 04/29/2021.  The patient also has hemoptysis that has been going on for several weeks in addition to 20 pounds of weight loss.  During her evaluation she had MRI of the brain with and without contrast on 04/19/2021 and it showed no intracranial metastatic disease or acute intracranial abnormalities.  There was abnormal right neck soft tissue in the right carotid space indeterminate for a 14 mm segment of right jugular vein thrombosis versus right carotid tumor or malignant lymph node.  This was new compared to the CT of the neck on 04/19/2017.  The patient had MRI of the cervical spine on 04/19/2021 and it showed  pathologic T3 compression fracture with extraosseous and spinal canal tumor.  There was also evidence of isolated osseous metastatic disease in the cervical spine at the right C4 facet and lamina but no malignant cervical spinal stenosis.  There was a partially visible right upper neck T2 hyperintense and enhancing soft tissue mass that was seen on the brain MRI.  This was suspicious for nerve sheath tumor.  MRI of the thoracolumbar spine showed the large metastatic deposit in the T3 vertebral with epidural compression of the cord and infiltrating the left T3-4 foramina.  There was uncomplicated metastatic disease at T9, T12, L4 and the sacrum.  The patient was seen by Dr. Tamala Julian and on 04/20/2021 she underwent flexible video bronchoscopy with EBUS and biopsy of a fungating endobronchial mass arising from the right lower lobe up into the bronchus intermedius, right middle lobe and right upper lobe were open. 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 ?The patient was seen by Dr. Lisbeth Renshaw from radiation oncology and started palliative radiotherapy to the T3 metastatic bone lesion. ?I was consulted to see the patient for evaluation and recommendation regarding her condition. ?When seen today the patient continues to complain of pain in the upper back as well as weakness in the lower extremities.  She continues to have shortness of breath with exertion as well as cough with blood-tinged sputum.  She denied having any fever or chills.  She has no nausea, vomiting, diarrhea or constipation.  She has no headache or visual changes. ?Family history significant for mother who died from his stroke and father died from natural causes.  There is no family history of malignancy. ?The patient was accompanied by one of her daughter and the other daughter was available  by phone during the visit.  She has a history of smoking for around 30 years but quit 20 years ago.  She has no history of alcohol or drug abuse. ?HPI ? ?Past Medical History:  ?Diagnosis Date  ? COPD (chronic obstructive pulmonary disease) with chronic bronchitis ([place])   ? HTN (hypertension)   ? Pneumothorax   ? ? ?Past Surgical History:  ?Procedure Laterality Date  ? BRONCHIAL NEEDLE ASPIRATION BIOPSY  04/20/2021  ? Procedure: BRONCHIAL NEEDLE ASPIRATION BIOPSIES;  Surgeon: Candee Furbish, MD;  Location: [facility] ENDOSCOPY;  Service: Endoscopy;;  ? BRONCHIAL WASHINGS  04/20/2021  ? Procedure: BRONCHIAL WASHINGS;  Surgeon: Candee Furbish, MD;  Location: [facility];  Service: Endoscopy;;  ? ENDOBRONCHIAL ULTRASOUND N/A 04/20/2021  ? Procedure: ENDOBRONCHIAL ULTRASOUND;  Surgeon: Candee Furbish, MD;  Location: [facility]OS[facility];  Service: Endoscopy;  Laterality: N/A;  ? HIP ARTHROPLASTY    ? VIDEO BRONCHOSCOPY Left 04/20/2021  ? Procedure: VIDEO BRONCHOSCOPY WITH FLUORO;  Surgeon: Candee Furbish, MD;  Location: [facility]OS[facility];  Service: Endoscopy;  Laterality: Left;  EBUS  ? ? ?Family History  ?Problem Relation Age of Onset  ? Cancer Father 72  ?     stomach  ? ? ?Social History ?Social History  ? ?Tobacco Use  ? Smoking status: Former  ?  Packs/day: 0.50  ?  Years: 53.00  ?  Pack years: 26.50  ?  Types: Cigarettes  ?  Quit date: 2013  ?  Years since quitting: 10.2  ? Smokeless tobacco: Former  ?  Quit date: 2013  ?Substance Use Topics  ? Alcohol use: Not Currently  ?  Comment: h/o heavy use - quit in 2013  ? Drug use: Never  ? ? ?No Known Allergies ? ?Current Facility-Administered Medications  ?Medication Dose Route Frequency Provider Last Rate Last Admin  ? acetaminophen (TYLENOL) tablet 650 mg  650 mg Oral Q6H PRN Karmen Bongo, MD   650 mg at 04/20/21 2119  ? Or  ? acetaminophen (TYLENOL) suppository 650 mg  650 mg Rectal Q6H PRN Karmen Bongo, MD      ? albuterol (PROVENTIL) (2.5 MG/3ML) 0.083% nebulizer solution 2.5 mg   2.5 mg Nebulization Q4H PRN Pokhrel, Laxman, MD      ? alum & mag hydroxide-simeth (MAALOX/MYLANTA) 200-200-20 MG/5ML suspension 30 mL  30 mL Oral Q4H PRN Karmen Bongo, MD   30 mL at 04/19/21 2347  ? atenolol (TENORMIN) tablet 50 mg  50 mg Oral Daily Pokhrel, Laxman, MD   50 mg at 04/21/21 0929  ? And  ? chlorthalidone (HYGROTON) tablet 25 mg  25 mg Oral Daily Pokhrel, Laxman, MD   25 mg at 04/21/21 0929  ? atorvastatin (LIPITOR) tablet 40 mg  40 mg Oral Daily Karmen Bongo, MD   40 mg at 04/21/21 1287  ? bisacodyl (DULCOLAX) EC tablet 5 mg  5 mg Oral Daily PRN Karmen Bongo, MD      ? dexamethasone (DECADRON) tablet 4 mg  4 mg Oral Q8H Hayden Pedro, [place]   4 mg at 04/21/21 1335  ? docusate sodium (COLACE) capsule 100 mg  100 mg Oral BID Karmen Bongo, MD   100 mg at 04/21/21 9371  ? gabapentin (NEURONTIN) capsule 300 mg  300 mg Oral BID Karmen Bongo, MD   300 mg at 04/21/21 6967  ? insulin aspart (novoLOG) injection 0-5 Units  0-5 Units Subcutaneous QHS Elodia Florence., MD      ? insulin aspart (novoLOG) injection 0-9 Units  0-9 Units Subcutaneous TID WC Elodia Florence., MD   2 Units at 04/21/21 1706  ? ipratropium-albuterol (DUONEB) 0.5-2.5 (3) MG/3ML nebulizer solution 3 mL  3 mL Nebulization BID Elodia Florence., MD      ? mometasone-formoterol [facility]) 200-5 MCG/ACT inhaler 2 puff  2 puff Inhalation BID Karmen Bongo, MD   2 puff at 04/21/21 0736  ? montelukast (SINGULAIR) tablet 10 mg  10 mg Oral Ivery Quale, MD   10 mg at 04/20/21 2119  ? morphine (PF) 2 MG/ML injection 2 mg  2 mg Intravenous Q2H PRN Karmen Bongo, MD   2 mg at 04/20/21 0827  ? multivitamin with minerals tablet 1 tablet  1 tablet Oral Daily Elodia Florence., MD   1 tablet at 04/21/21 1125  ? ondansetron (ZOFRAN) tablet 4 mg  4 mg Oral Q6H PRN Karmen Bongo, MD      ? Or  ? ondansetron [facility] injection 4 mg  4 mg Intravenous Q6H PRN Karmen Bongo, MD      ? oxyCODONE (Oxy  IR/ROXICODONE) immediate release tablet 5 mg  5 mg Oral Q4H PRN Karmen Bongo, MD   5 mg at 04/20/21 2245  ? polyethylene glycol (MIRALAX / GLYCOLAX) packet 17 g  17 g Oral Daily PRN Karmen Bongo, MD      ? ? ?

## 2021-04-21 NOTE — Progress Notes (Signed)
04/21/2021 ? ?Looks good post English as a second language teacher. ?Further tx per radOnc. ?Available PRN. ? ?Erskine Emery MD Pulm ?

## 2021-04-21 NOTE — Evaluation (Signed)
Physical Therapy Evaluation ?Patient Details ?Name: Anna Finley ?MRN: [id number] ?DOB: 1945/02/01 ?Today's Date: 04/21/2021 ? ?History of Present Illness ? Patient is 76 y.o. female with recent diagnosis of recent diagnosis of Rt lung cancer. Pt developed significant weight loss hemoptysis with worsening cough and due to these new findings of weakness ambulation patient has been considered for admission to hospital for further evaluation. MRI revealed  T3 compression fracture and bulky tumor in upper thoracic spinal canal causing cord compression.  follow up MRI revealed additional T12, T9 lesions. PMH significant for HTn, COPD, THA. ? ?  ?Clinical Impression ? Anna Finley is 76 y.o. female admitted with above HPI and diagnosis. Patient is currently limited by functional impairments below (see PT problem list). Patient lives with her family and PTA was independent with mobility with no device. Over the last ~2 weeks pt and daughter report decline in strength and balance and pt has been falling. Pt currently requires min assist for transfers and Mod-Max assist required for gait due to significant coordination deficits with poor motor planning and ataxia. Patient ambulated ~60' with RW and mod-max assist and is highly motivated to regain/maximize functional independent prior to return home with family. Pt's family can provide 24/7 assist and physical assist. Patient will benefit from continued skilled PT interventions to address impairments and progress independence with mobility, recommending intense rehab at [facility] to maximize functional independence and reduce caregiver burden. Acute PT will follow and progress as able.    ?   ? ?Recommendations for follow up therapy are one component of a multi-disciplinary discharge planning process, led by the attending physician.  Recommendations may be updated based on patient status, additional functional criteria and insurance authorization. ? ?Follow Up Recommendations   ? ?   ? ?PT Recommendation   ?Recommendations for Other Services Rehab consult, OT consult Filed 04/21/2021 1100  ?Follow Up Recommendations Acute inpatient rehab (3hours/day) Filed 04/21/2021 1100  ?Assistance recommended at discharge Frequent or constant Supervision/Assistance Filed 04/21/2021 1100  ?Patient can return home with the following A lot of help with walking and/or transfers, A lot of help with bathing/dressing/bathroom, Assistance with cooking/housework, Direct supervision/assist for medications management, Assist for transportation, Help with stairs or ramp for entrance Filed 04/21/2021 1100  ?Functional Status Assessment Patient has had a recent decline in their functional status and demonstrates the ability to make significant improvements in function in a reasonable and predictable amount of time. Filed 04/21/2021 1100  ?PT equipment None recommended by PT Filed 04/21/2021 1100  ? ? ? 04/21/21 1100  ?PT Visit Information  ?Last PT Received On 04/21/21  ?Assistance Needed +1  ?History of Present Illness Patient is 76 y.o. female with recent diagnosis of recent diagnosis of Rt lung cancer. Pt developed significant weight loss hemoptysis with worsening cough and due to these new findings of weakness ambulation patient has been considered for admission to hospital for further evaluation. MRI revealed  T3 compression fracture and bulky tumor in upper thoracic spinal canal causing cord compression.  follow up MRI revealed additional T12, T9 lesions. PMH significant for HTn, COPD, THA.  ?Precautions  ?Precautions Fall  ?Precaution Comments A LOT of falls per daughter  ?Restrictions  ?Weight Bearing Restrictions No  ?Home Living  ?Family/patient expects to be discharged to: Private residence  ?Living Arrangements Children;Other relatives ?(grandkids)  ?Available Help at Discharge Family;Available 24 hours/day  ?Type of Home House  ?Home Access Stairs to enter  ?Entrance Stairs-Number of Steps 8  ?Entrance  Stairs-Rails Left  ?  Home Layout Two level;Full bath on main level;Able to live on main level with bedroom/bathroom  ?Alternate Level Stairs-Number of Steps pt stays on main level  ?Bathroom Shower/Tub Tub/shower unit  ?Bathroom Toilet Standard  ?Bathroom Accessibility Yes  ?Home Equipment [employer] (2 wheels);Shower seat  ?Prior Function  ?Prior Level of Function  Independent/Modified Independent  ?Mobility Comments pt developed increased weakness starting ~2 weeks ago. prior to this pt was independent with mobility with no device. in last 2 weeks pt's daugther reports she has been helping pt stand and walk with cane.  ?ADLs Comments pt was independent PTA but increasingly unsteady with mobility in last 2 weeks.  ?Communication  ?Communication No difficulties  ?Cognition  ?Arousal/Alertness Awake/alert  ?Behavior During Therapy [facility] for tasks assessed/performed  ?Overall Cognitive Status Within Functional Limits for tasks assessed  ?Upper Extremity Assessment  ?Upper Extremity Assessment Defer to OT evaluation;Overall [facility] for tasks assessed;RUE deficits/detail;LUE deficits/detail  ?RUE Deficits / Details overall good strength, slow and slightly impaired finger to nose bil.  ?LUE Deficits / Details overall good strength, slow and slightly impaired finger to nose bil.  ?Lower Extremity Assessment  ?Lower Extremity Assessment RLE deficits/detail;LLE deficits/detail;Generalized weakness  ?RLE Deficits / Details 4/5 for hip flexion, knee flexion/extension, dorsi/plantar flexion. impaired heel to shin  ?RLE Coordination decreased gross motor  ?LLE Deficits / Details 4/5 for hip flexion, knee flexion/extension, dorsi/plantar flexion. impaired heel to shin Lt worse than Rt  ?LLE Coordination decreased gross motor  ?Cervical / Trunk Assessment  ?Cervical / Trunk Assessment Other exceptions  ?Cervical / Trunk Exceptions spinal lesions, pt frail  ?Bed Mobility  ?Overal bed mobility Needs Assistance  ?Bed Mobility Supine to  Sit  ?Transfers  ?Overall transfer level Needs assistance  ?Equipment used Rolling walker (2 wheels)  ?Transfers Sit to/from Stand  ?Sit to Stand Min assist  ?General transfer comment pt with good use of power up from EOB and recliner. Min assist to steady, pt locking knees in hyperextension to stabilize self and slight anterior lean noted.  ?Ambulation/Gait  ?Ambulation/Gait assistance Mod assist;Max assist  ?Gait Distance (Feet) 60 Feet  ?Assistive device Rolling walker (2 wheels)  ?Gait Pattern/deviations Step-through pattern;Decreased stride length;Knees buckling;Scissoring;Ataxic;Drifts right/left;Narrow base of support  ?General Gait Details Mod-Max assist to stabilize balance with RW. pt with NBOS and scissoring steps. Therapist provided manual blocking for Lt LE and assisted with foot placement. Rt foot tendign to step wide and then pt bringing Rt foot back in crossing Lt. When pt slowed down and therapist guided Lt foot placement pt able to use visual cue to step Rt foot even with Lt 25% of time.  ?Gait velocity decr  ?Balance  ?Overall balance assessment History of Falls;Needs assistance  ?Sitting-balance support Feet supported  ?Sitting balance-Leahy Scale Fair  ?Standing balance support Reliant on assistive device for balance;During functional activity;Bilateral upper extremity supported  ?Standing balance-Leahy Scale Poor  ?PT - End of Session  ?Equipment Utilized During Treatment Gait belt  ?Activity Tolerance Patient tolerated treatment well  ?Patient left in chair;with call bell/phone within reach;with chair alarm set;with family/visitor present  ?Nurse Communication Mobility status  ?PT Assessment  ?PT Recommendation/Assessment Patient needs continued PT services  ?PT Visit Diagnosis Unsteadiness on feet (R26.81);Other abnormalities of gait and mobility (R26.89);Muscle weakness (generalized) (M62.81);Difficulty in walking, not elsewhere classified (R26.2);Other symptoms and signs involving the  nervous system (R29.898);History of falling (Z91.81);Ataxic gait (R26.0)  ?PT Problem List Decreased strength;Decreased activity tolerance;Decreased balance;Decreased mobility;Decreased coordination;Decreased

## 2021-04-21 NOTE — Progress Notes (Signed)
? ?  Inpatient Rehab Admissions Coordinator : ? ?Per therapy recommendations, patient was screened for CIR candidacy by Aeneas Longsworth RN MSN.  At this time patient appears to be a potential candidate for CIR. I will place a rehab consult per protocol for full assessment. Please call me with any questions. ? ?Jaaziel Peatross RN MSN ?Admissions Coordinator ?[phone] ?  ?

## 2021-04-21 NOTE — Anesthesia Postprocedure Evaluation (Signed)
Anesthesia Post Note ? ?Patient: Anna Finley ? ?Procedure(Finley) Performed: VIDEO BRONCHOSCOPY WITH FLUORO (Left) ?ENDOBRONCHIAL ULTRASOUND ?BRONCHIAL NEEDLE ASPIRATION BIOPSIES ?BRONCHIAL WASHINGS ? ?  ? ?Patient location during evaluation: PACU ?Anesthesia Type: General ?Level of consciousness: awake and alert ?Pain management: pain level controlled ?Vital Signs Assessment: post-procedure vital signs reviewed and stable ?Respiratory status: spontaneous breathing, nonlabored ventilation, respiratory function stable and patient connected to nasal cannula oxygen ?Cardiovascular status: blood pressure returned to baseline and stable ?Postop Assessment: no apparent nausea or vomiting ?Anesthetic complications: no ? ? ?No notable events documented. ? ?Last Vitals:  ?Vitals:  ? 04/21/21 0739 04/21/21 0805  ?BP:  122/68  ?Pulse:  72  ?Resp:  15  ?Temp:  36.6 ?C  ?SpO2: 98% 94%  ?  ?Last Pain:  ?Vitals:  ? 04/21/21 0805  ?TempSrc: Oral  ?PainSc: 0-No pain  ? ? ?  ?  ?  ?  ?  ?  ? ?Anna Finley ? ? ? ? ?

## 2021-04-21 NOTE — Progress Notes (Signed)
Patient ID: Anna Finley, female   DOB: [birth year], 76 y.o.   MRN: [id number] ?MRI thoracic noted... Additional lesions identified at T12 T9 and scattered.  Predominant lesion T3 with epidural extension.  As long as patient remains with full lower extremity strength continue to recommend once confirmation of malignancy is determined on biopsy proceed with radiation if it is a radiosensitive tumor.  If it is not radiosensitive, then we could consider cytoreductive surgery however we would not be able to resect the entire mass in this location.  We can decompress the epidural space and then we would have to follow-up with screw and rod fixation from T1-T5. ?

## 2021-04-21 NOTE — Progress Notes (Signed)
?PROGRESS NOTE ? ? ? ?Anna Finley  FTD:[id number] DOB: Sep 23, 1945 DOA: 04/18/2021 ?PCP: [name], MD  ?Chief Complaint  ?Patient presents with  ? Gait Problem  ? ? ?Brief Narrative:  ?76 yo with hx HTN, COPD, and suspected lung cancer based on imaging who was planned for outpatient PET/biopsy presented in interim with difficulty ambulated and neck/shoulder pain.  Due to issues with ambulation, weight loss, hemoptysis she presented to the ED and was found to have metastatic deposit in T3 vertebrae with cord compression. ? ?See below for additional details  ? ? ?Assessment & Plan: ?  ?Principal Problem: ?  Spinal cord compression ([place]) ?Active Problems: ?  Malignant neoplasm of lower lobe of right lung ([place]) ?  Abnormal MRI of head ?  Cancer related pain ?  COPD (chronic obstructive pulmonary disease) with chronic bronchitis ([place]) ?  HTN (hypertension) ?  Protein-calorie malnutrition, severe ?  Hypokalemia ? ? ?Assessment and Plan: ?* Spinal cord compression ([place]) ?Patient presenting with B leg weakness, dfficulty ambulating ?MRI C spine with pathologic compression fx of T3 with bulky enhancing tumor in the upper thoracic spinal cord canal and spinal cord compression, possible mild T3 level cord edema.  Tumor obliterating bilateral T3 and L T2 neural foramina. ?MRI T/L spine with metastatic deposit in T3 verebra with epidural tumor compressing the cord and infiltrating the L C6-2 foramen, uncomplicated metastatic disease at T9, T12, L4, and sacrum.  Degenerative spinal stenosis at L4-5, degenerative bilateral foraminal impingement at L4-5 and L5-S1. ?MRI brain with no intracranial metastatic disease or acute intracranial abnormality ?Neurosurgery recommending proceed with radiation if radiosensitive tumor, could consider cytoreductive surgery if not radiosensitive ?Rad onc planning for 10 fractions of radiotherapy to thoracic spine ?dexamethasone ? ?Malignant neoplasm of lower lobe of right lung ([place]) ?S/p  flexible bronch and EBUS bronch on 4/4 ?Cytology 04/20/21 with LN with malignant cells c/w metastatic adenocarcinoma  ?Cytology 04/20/21 with endobronchial mass with malignant cells c/w adenocarcinoma ?Malignant cells present in RLL lung washing  ?Follow pending biopsy ?Will consult medical oncology ?Appreciate pulmonology ? ?Abnormal MRI of head ?With abnormal R neck soft tissues in R carotid space, indeterminate for 14 mm segment of R jugular vein thrombosis vs right carotid tumor or malignant LN ?CT neck with contrast ordered ? ?Cancer related pain ?Morphine, oxy ? ?COPD (chronic obstructive pulmonary disease) with chronic bronchitis ([place]) ?Albuterol, dulera, singulair  ? ?HTN (hypertension) ?Atenolol, chlorthalidone ? ? ?DVT prophylaxis: SCD ?Code Status: full ?Family Communication: daughter on video chat ?Disposition:  ? ?Status is: Inpatient ?Remains inpatient appropriate because: need for radiation therapy, oncology c/s, therapy ?  ?Consultants:  ?PCCM ?Rad onc ?neurosurgery ? ?Procedures:  ?Flexible bronchoscopy and EBUS Bronchoscopy ? ?Antimicrobials:  ?Anti-infectives (From admission, onward)  ? ? None  ? ?  ? ? ?Subjective: ?C/o issues standing ? ?Objective: ?Vitals:  ? 04/21/21 0736 04/21/21 0739 04/21/21 0805 04/21/21 1348  ?BP:   122/68 139/70  ?Pulse:   72 (!) 56  ?Resp:   15 16  ?Temp:   97.9 ?F (36.6 ?C) 97.8 ?F (36.6 ?C)  ?TempSrc:   Oral Oral  ?SpO2: 98% 98% 94% 99%  ?Weight:      ?Height:      ? ? ?Intake/Output Summary (Last 24 hours) at 04/21/2021 1636 ?Last data filed at 04/21/2021 1310 ?Gross per 24 hour  ?Intake 1480 ml  ?Output --  ?Net 1480 ml  ? ?Filed Weights  ? 04/18/21 2258 04/19/21 1421 04/20/21 1232  ?Weight: 43.1  kg 43.4 kg 43.4 kg  ? ? ?Examination: ? ?General exam: Appears calm and comfortable  ?Respiratory system: unlabored ?Cardiovascular system: RRR ?Gastrointestinal system: Abdomen is nondistended, soft and nontender.  ?Central nervous system: Alert and oriented. Symmetric lower  extremity strength, discoordination appears worse on L, heel to shin ?Extremities: no LEE ?Skin: No rashes, lesions or ulcers ?Psychiatry: Judgement and insight appear normal. Mood & affect appropriate.  ? ? ? ?Data Reviewed: I have personally reviewed following labs and imaging studies ? ?CBC: ?Recent Labs  ?Lab 04/18/21 ?2336 04/20/21 ?2542 04/21/21 ?0518  ?WBC 9.7 7.6 11.1*  ?NEUTROABS 6.3  --   --   ?HGB 9.9* 10.6* 9.2*  ?HCT 29.9* 32.5* 26.9*  ?MCV 84.9 86.2 86.2  ?PLT 376 386 334  ? ? ?Basic Metabolic Panel: ?Recent Labs  ?Lab 04/18/21 ?2336 04/20/21 ?7062 04/21/21 ?0518  ?NA 134* 133* 133*  ?K 3.4* 4.2 4.2  ?CL 99 97* 98  ?CO2 _0 ?GLUCOSE 135* 150* 149*  ?BUN _1 ?CREATININE 0.91 0.64 0.66  ?CALCIUM 8.8* 9.0 8.4*  ?MG  --   --  1.8  ? ? ?GFR: ?Estimated Creatinine Clearance: 42.3 mL/min (by C-G formula based on SCr of 0.66 mg/dL). ? ?Liver Function Tests: ?No results for input(s): AST, ALT, ALKPHOS, BILITOT, PROT, ALBUMIN in the last 168 hours. ? ?CBG: ?Recent Labs  ?Lab 04/19/21 ?1637 04/20/21 ?1258 04/20/21 ?2122 04/21/21 ?0730 04/21/21 ?1217  ?GLUCAP 97 108* 174* 176* 119*  ? ? ? ?No results found for this or any previous visit (from the past 240 hour(s)).  ? ? ? ? ? ?Radiology Studies: ?MR THORACIC SPINE W WO CONTRAST ? ?Result Date: 04/21/2021 ?CLINICAL DATA:  Evaluate for cord compression. Metastatic disease with inability to walk. EXAM: MRI THORACIC AND LUMBAR SPINE WITHOUT AND WITH CONTRAST TECHNIQUE: Multiplanar and multiecho pulse sequences of the thoracic and lumbar spine were obtained without and with intravenous contrast. CONTRAST:  21m GADAVIST GADOBUTROL 1 MMOL/ML IV SOLN COMPARISON:  Cervical MRI from 2 days ago. FINDINGS: MRI THORACIC SPINE FINDINGS Alignment:  Exaggerated thoracic kyphosis.  No listhesis Vertebrae: Metastatic pattern. Kayra Crowell large deposit at T3 diffusely involves the vertebral body and left more than right posterior elements, with epidural tumor extension compressing  the cord and infiltrating the left T3-4 foramen. Additional notable deposit in the T12 body without fracture. T9 rounded signal abnormality is most likely hemangioma based on T1 signal characteristics. T9 right transverse process metastasis. There is heterogeneity of marrow diffusely on T1 weighted imaging, but without the enhancement or STIR signal accompanying other metastases. Cord:  Compression without detected edema at the level of T3. Paraspinal and other soft tissues: Right lower lobe mass, reference dedicated imaging of the body. Disc levels: No significant degenerative changes or degenerative impingement. MRI LUMBAR SPINE FINDINGS Segmentation:  5 lumbar type vertebrae Alignment:  Slight anterolisthesis at L5-S1 Vertebrae: Infiltrating metastasis in the L4 posteroinferior body. No epidural tumor seen. Heterogeneity of the sacrum with discrete metastasis seen in the right ala, approximately 17 mm. Conus medullaris: Extends to the L1 level and appears normal. Paraspinal and other soft tissues: No perispinal mass or inflammation. Disc levels: L4-L5: Disc narrowing and bulging. Degenerative facet spurring and ligamentum flavum thickening. Bilateral joint effusions with posterior synovial cysts/degenerative ganglia. Advanced spinal stenosis. Biforaminal impingement L5-S1:Disc narrowing and endplate degeneration with disc bulging and ridging. Bilateral facet spurring. Biforaminal high-grade impingement. Noncompressive right subarticular recess narrowing. IMPRESSION: 1. Large metastatic deposit in the T3 vertebra  with epidural tumor compressing the cord and infiltrating the left T3-4 foramen. 2. Uncomplicated metastatic disease at T9, T12, L4, and the sacrum. 3. 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 Degenerative bilateral foraminal impingement at L4-5 and especially L5-S1. Electronically Signed   By: Jorje Guild M.D.   On: 04/21/2021 07:12  ? ?MR Lumbar Spine W Wo Contrast ? ?Result Date:  04/21/2021 ?CLINICAL DATA:  Evaluate for cord compression. Metastatic disease with inability to walk. EXAM: MRI THORACIC AND LUMBAR SPINE WITHOUT AND WITH CONTRAST TECHNIQUE: Multiplanar and multiecho pulse sequences of th

## 2021-04-21 NOTE — Assessment & Plan Note (Addendum)
Morphine, oxy ?

## 2021-04-21 NOTE — Assessment & Plan Note (Addendum)
CT with nonocclusive short segment thrombus within R IJ ?No intracranial metastatic disease ?eliquis 5 mg BID at discharge (no loading dose given her recent hemoptysis) ? ?

## 2021-04-21 NOTE — Assessment & Plan Note (Addendum)
Continued overnight ?She's receiving radiation to lung mass as well (discussed with oncology/rad onc today) ?Resolved, follow on eliquis ?

## 2021-04-22 ENCOUNTER — Ambulatory Visit: Admit: 2021-04-22 | Payer: Medicare Other

## 2021-04-22 DIAGNOSIS — Z7189 Other specified counseling: Secondary | ICD-10-CM | POA: Diagnosis not present

## 2021-04-22 LAB — CBC WITH DIFFERENTIAL/PLATELET
Abs Immature Granulocytes: 0.11 10*3/uL — ABNORMAL HIGH (ref 0.00–0.07)
Basophils Absolute: 0 10*3/uL (ref 0.0–0.1)
Basophils Relative: 0 %
Eosinophils Absolute: 0 10*3/uL (ref 0.0–0.5)
Eosinophils Relative: 0 %
HCT: 29.6 % — ABNORMAL LOW (ref 36.0–46.0)
Hemoglobin: 9.8 g/dL — ABNORMAL LOW (ref 12.0–15.0)
Immature Granulocytes: 1 %
Lymphocytes Relative: 11 %
Lymphs Abs: 1.7 10*3/uL (ref 0.7–4.0)
MCH: 28.2 pg (ref 26.0–34.0)
MCHC: 33.1 g/dL (ref 30.0–36.0)
MCV: 85.3 fL (ref 80.0–100.0)
Monocytes Absolute: 0.8 10*3/uL (ref 0.1–1.0)
Monocytes Relative: 5 %
Neutro Abs: 12 10*3/uL — ABNORMAL HIGH (ref 1.7–7.7)
Neutrophils Relative %: 83 %
Platelets: 417 10*3/uL — ABNORMAL HIGH (ref 150–400)
RBC: 3.47 MIL/uL — ABNORMAL LOW (ref 3.87–5.11)
RDW: 16.9 % — ABNORMAL HIGH (ref 11.5–15.5)
WBC: 14.6 10*3/uL — ABNORMAL HIGH (ref 4.0–10.5)
nRBC: 0 % (ref 0.0–0.2)

## 2021-04-22 LAB — COMPREHENSIVE METABOLIC PANEL
ALT: 14 U/L (ref 0–44)
AST: 18 U/L (ref 15–41)
Albumin: 3.1 g/dL — ABNORMAL LOW (ref 3.5–5.0)
Alkaline Phosphatase: 191 U/L — ABNORMAL HIGH (ref 38–126)
Anion gap: 10 (ref 5–15)
BUN: 16 mg/dL (ref 8–23)
CO2: 26 mmol/L (ref 22–32)
Calcium: 9 mg/dL (ref 8.9–10.3)
Chloride: 97 mmol/L — ABNORMAL LOW (ref 98–111)
Creatinine, Ser: 0.54 mg/dL (ref 0.44–1.00)
GFR, Estimated: >60 mL/min (ref >60)
Glucose, Bld: 128 mg/dL — ABNORMAL HIGH (ref 70–99)
Potassium: 4 mmol/L (ref 3.5–5.1)
Sodium: 133 mmol/L — ABNORMAL LOW (ref 135–145)
Total Bilirubin: 0.5 mg/dL (ref 0.3–1.2)
Total Protein: 6.3 g/dL — ABNORMAL LOW (ref 6.5–8.1)

## 2021-04-22 LAB — HEMOGLOBIN A1C
Hgb A1c MFr Bld: 5.5 % (ref 4.8–5.6)
Mean Plasma Glucose: 111 mg/dL

## 2021-04-22 LAB — GLUCOSE, CAPILLARY
Glucose-Capillary: 135 mg/dL — ABNORMAL HIGH (ref 70–99)
Glucose-Capillary: 194 mg/dL — ABNORMAL HIGH (ref 70–99)
Glucose-Capillary: 237 mg/dL — ABNORMAL HIGH (ref 70–99)
Glucose-Capillary: 102 mg/dL — ABNORMAL HIGH (ref 70–99)

## 2021-04-22 LAB — HEPARIN LEVEL (UNFRACTIONATED): Heparin Unfractionated: <0.1 IU/mL — ABNORMAL LOW (ref 0.30–0.70)

## 2021-04-22 LAB — MAGNESIUM: Magnesium: 2 mg/dL (ref 1.7–2.4)

## 2021-04-22 LAB — CYTOLOGY - NON PAP

## 2021-04-22 LAB — PHOSPHORUS: Phosphorus: 3.8 mg/dL (ref 2.5–4.6)

## 2021-04-22 MED ORDER — HEPARIN (PORCINE) 25000 UT/250ML-% IV SOLN
700.0000 [IU]/h | INTRAVENOUS | Status: DC
Start: 2021-04-22 — End: 2021-04-22
  Administered 2021-04-22: 700 [IU]/h via INTRAVENOUS
  Filled 2021-04-22: qty 250

## 2021-04-22 MED ORDER — TRANEXAMIC ACID FOR INHALATION
500.0000 mg | Freq: Three times a day (TID) | RESPIRATORY_TRACT | Status: AC
  Administered 2021-04-22 – 2021-04-25 (×9): 500 mg via RESPIRATORY_TRACT
  Filled 2021-04-22 (×2): qty 5
  Filled 2021-04-22 (×2): qty 10
  Filled 2021-04-22 (×3): qty 5
  Filled 2021-04-22: qty 10
  Filled 2021-04-22: qty 5
  Filled 2021-04-22: qty 10

## 2021-04-22 NOTE — Progress Notes (Addendum)
?PROGRESS NOTE ? ? ? ?Anna Finley  TZG:[id number] DOB: 01/13/1946 DOA: 04/18/2021 ?PCP: [name], MD  ?Chief Complaint  ?Patient presents with  ? Gait Problem  ? ? ?Brief Narrative:  ?76 yo with hx HTN, COPD, and suspected lung cancer based on imaging who was planned for outpatient PET/biopsy presented in interim with difficulty ambulated and neck/shoulder pain.  Due to issues with ambulation, weight loss, hemoptysis she presented to the ED and was found to have metastatic deposit in T3 vertebrae with cord compression. ? ?See below for additional details  ? ? ?Assessment & Plan: ?  ?Principal Problem: ?  Goals of care, counseling/discussion ?Active Problems: ?  Spinal cord compression ([place]) ?  Malignant neoplasm of lower lobe of right lung ([place]) ?  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  Cancer related pain ?  Hemoptysis ?  COPD (chronic obstructive pulmonary disease) with chronic bronchitis ([place]) ?  HTN (hypertension) ?  Protein-calorie malnutrition, severe ?  Hypokalemia ? ? ?Assessment and Plan: ? ?Addendum ?Continued bleeding today, hold heparin for now.  Will discuss with oncology in AM. ? ?* Goals of care, counseling/discussion ?Said she wanted to go home today, refused radiation today.  Not clear when this change occurred, but seems like she wants to be home and be comfortable.  Tried to educate patient, daughter, grandaughter regarding our goals and why I recommended that she stay for another 24-48 hours at least (need to ensure stability on anticoagulation, continued radiation therapy, etc).  Daughter Peter Congo) seems to be agreeable since we expect she'll likely be able to discharge her this weekend (if things go well with anticoagulation).  Will continue to educate.  Msg sent to rad onc regarding ? ?Spinal cord compression ([place]) ?Patient presenting with B leg weakness, dfficulty ambulating ?MRI C spine with pathologic compression fx of T3 with bulky enhancing tumor in  the upper thoracic spinal cord canal and spinal cord compression, possible mild T3 level cord edema.  Tumor obliterating bilateral T3 and L T2 neural foramina. ?MRI T/L spine with metastatic deposit in T3 verebra with epidural tumor compressing the cord and infiltrating the L P5-9 foramen, uncomplicated metastatic disease at T9, T12, L4, and sacrum.  Degenerative spinal stenosis at L4-5, degenerative bilateral foraminal impingement at L4-5 and L5-S1. ?MRI brain with no intracranial metastatic disease or acute intracranial abnormality ?Neurosurgery recommending proceed with radiation if radiosensitive tumor, could consider cytoreductive surgery if not radiosensitive ?Rad onc planning for 10 fractions of radiotherapy to thoracic spine ?Dexamethasone ?Oncology planning to follow up outpatient after PET scan ? ?Malignant neoplasm of lower lobe of right lung ([place]) ?S/p flexible bronch and EBUS bronch on 4/4 ?Cytology 04/20/21 with LN with malignant cells c/w metastatic adenocarcinoma  ?Cytology 04/20/21 with endobronchial mass with malignant cells c/w adenocarcinoma ?Malignant cells present in RLL lung washing  ?Follow pending cytology ?Will consult medical oncology -> planning to follow outpatient after PET scan in clinic ?Appreciate pulmonology ? 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 ?Hemoptysis ?Small volume today, she notes it's overall improving ?Watch closely on anticoagulation ? ?Cancer related pain ?Morphine, oxy ? ?COPD (chronic obstructive pulmonary disease) with chronic bronchitis ([place]) ?Albuterol, dulera, singulair  ? ?HTN (hypertension) ?Atenolol, chlorthalidone ? ? ?DVT prophylaxis: SCD ?Code Status: full ?Family Communication:  daughter on video  chat ?Disposition:  ? ?Status is: Inpatient ?Remains inpatient appropriate because: need for radiation therapy, oncology c/s, therapy ?  ?Consultants:  ?PCCM ?Rad onc ?neurosurgery ? ?Procedures:  ?Flexible bronchoscopy and EBUS Bronchoscopy ? ?Antimicrobials:  ?Anti-infectives (From admission, onward)  ? ? None  ? ?  ? ? ?Subjective: ?No new complaints ? ?Objective: ?Vitals:  ? 04/21/21 2030 04/22/21 0457 04/22/21 0859 04/22/21 1251  ?BP: 139/63 (!) 154/71  (!) 117/55  ?Pulse: 65 61  (!) 58  ?Resp: _0 ?Temp: 98.7 ?F (37.1 ?C) 98.3 ?F (36.8 ?C)  98.1 ?F (36.7 ?C)  ?TempSrc: Oral Oral  Oral  ?SpO2: 99% 98% 95% 97%  ?Weight:      ?Height:      ? ? ?Intake/Output Summary (Last 24 hours) at 04/22/2021 1736 ?Last data filed at 04/22/2021 1548 ?Gross per 24 hour  ?Intake 277.95 ml  ?Output 300 ml  ?Net -22.05 ml  ? ?Filed Weights  ? 04/18/21 2258 04/19/21 1421 04/20/21 1232  ?Weight: 43.1 kg 43.4 kg 43.4 kg  ? ? ?Examination: ? ?General: No acute distress. ?Cardiovascular: RRR ?Lungs: unlabored ?Abdomen: Soft, nontender, nondistended  ?Neurological: Alert and oriented ?3. Moves all extremities ?4.  ?Skin: Warm and dry. No rashes or lesions. ?Extremities: No clubbing or cyanosis. No edema.  ? ? ?Data Reviewed: I have personally reviewed following labs and imaging studies ? ?CBC: ?Recent Labs  ?Lab 04/18/21 ?2336 04/20/21 ?4235 04/21/21 ?0518 04/22/21 ?3614  ?WBC 9.7 7.6 11.1* 14.6*  ?NEUTROABS 6.3  --   --  12.0*  ?HGB 9.9* 10.6* 9.2* 9.8*  ?HCT 29.9* 32.5* 26.9* 29.6*  ?MCV 84.9 86.2 86.2 85.3  ?PLT 376 386 334 417*  ? ? ?Basic Metabolic Panel: ?Recent Labs  ?Lab 04/18/21 ?2336 04/20/21 ?4315 04/21/21 ?0518 04/22/21 ?4008  ?NA 134* 133* 133* 133*  ?K 3.4* 4.2 4.2 4.0  ?CL 99 97* 98 97*  ?CO2 _1 ?GLUCOSE 135* 150* 149* 128*  ?BUN _2 ?CREATININE 0.91 0.64 0.66 0.54  ?CALCIUM 8.8* 9.0 8.4* 9.0  ?MG  --   --  1.8 2.0  ?PHOS  --   --   --  3.8  ? ? ?GFR: ?Estimated Creatinine Clearance: 42.3 mL/min (by  C-G formula based on SCr of 0.54 mg/dL). ? ?Liver Function Tests: ?Recent Labs  ?Lab 04/22/21 ?6761  ?AST 18  ?ALT 14  ?ALKPHOS 191*  ?BILITOT 0.5  ?PROT 6.3*  ?ALBUMIN 3.1*  ? ? ?CBG: ?Recent Labs  ?Lab 04/21/21 ?1217 04/21/21 ?1702 04/21/21 ?2134 04/22/21 ?0750 04/22/21 ?1217  ?GLUCAP 119* 154* 162* 135* 194*  ? ? ? ?No results found for this or any previous visit (from the past 240 hour(s)).  ? ? ? ? ? ?Radiology Studies: ?CT ANGIO NECK W OR WO CONTRAST ? ?Result Date: 04/21/2021 ?CLINICAL DATA:  abnormal MRI, concern for jugular vein thrombosis vs right carotid tumor or malignant LN. EXAM: CT ANGIOGRAPHY NECK TECHNIQUE: Multidetector CT imaging of the neck was performed using the standard protocol during bolus administration of intravenous contrast. Multiplanar CT image reconstructions and MIPs were obtained to evaluate the vascular anatomy. Carotid stenosis measurements (when applicable) are obtained utilizing NASCET criteria, using the distal internal carotid diameter as the denominator. RADIATION DOSE REDUCTION: This exam was performed according to the departmental dose-optimization program which includes automated exposure control, adjustment of the mA and/or kV according to patient size and/or use of iterative reconstruction technique. CONTRAST:  59m OMNIPAQUE IOHEXOL 350 MG/ML SOLN COMPARISON:  None. FINDINGS: Aortic arch: Standard branching. Imaged portion shows no evidence of aneurysm or dissection. No significant stenosis of the major arch vessel origins. Calcific aortic atherosclerosis. Right carotid system: There is mild calcific atherosclerosis at the carotid bifurcation. No stenosis. Left carotid system: There is calcific atherosclerosis at the carotid bifurcation without hemodynamically significant stenosis. Vertebral arteries: Codominant. Skeleton: Negative Other neck: There is nonocclusive thrombus within Preet Mangano short segment of the right internal jugular vein just inferior to the skull base. Upper  chest: Negative IMPRESSION: 1. Bilateral carotid bifurcation atherosclerosis without hemodynamically significant stenosis. 2. Nonocclusive short segment thrombus within the right internal jugular vein just infer

## 2021-04-22 NOTE — Progress Notes (Signed)
Inpatient Rehabilitation Admissions Coordinator  ? ?Noted patient and family requesting discharge home therefore we will sign off and not consult. ? ?Danne Baxter, RN, MSN ?Rehab Admissions Coordinator ?([phone] ?04/22/2021 6:53 PM ? ?

## 2021-04-22 NOTE — Assessment & Plan Note (Deleted)
Agreeable to inpatient rehab today ?Will continue to discuss care plan and next steps based on what's important to her ?

## 2021-04-22 NOTE — TOC Initial Note (Signed)
Transition of Care (TOC) - Initial/Assessment Note  ? ? ?Patient Details  ?Name: Anna Finley ?MRN: [id number] ?Date of Birth: August 27, 1945 ? ?Transition of Care (TOC) CM/SW Contact:    ?Ruchama Kubicek, Marjie Skiff, RN ?Phone Number: ?04/22/2021, 11:41 AM ? ?Clinical Narrative:                 ?Physical therapy recommendation of CIR. CIR has done initial screen and full assessment to follow. TOC will follow along. ? ?Expected Discharge Plan: [place] ?Barriers to Discharge: Continued Medical Work up ? ? ?Expected Discharge Plan and Services ?Expected Discharge Plan: [place] ?  ?Discharge Planning Services: CM Consult ?  ?Living arrangements for the past 2 months: Single Family Home ?                ?  ?  ?Prior Living Arrangements/Services ?Living arrangements for the past 2 months: [place] ?Lives with:: Adult Children ?  ?        ? ?Activities of Daily Living ?Home Assistive Devices/Equipment: Gilford Rile (specify type) ?ADL Screening (condition at time of admission) ?Patient's cognitive ability adequate to safely complete daily activities?: Yes ?Is the patient deaf or have difficulty hearing?: No ?Does the patient have difficulty seeing, even when wearing glasses/contacts?: No ?Does the patient have difficulty concentrating, remembering, or making decisions?: No ?Patient able to express need for assistance with ADLs?: Yes ?Does the patient have difficulty dressing or bathing?: No ?Independently performs ADLs?: No ?Communication: Independent ?Dressing (OT): Independent ?Grooming: Independent ?Feeding: Independent ?Bathing: Needs assistance ?Is this a change from baseline?: Change from baseline, expected to last >3 days ?Toileting: Needs assistance ?Is this a change from baseline?: Change from baseline, expected to last >3days ?In/Out Bed: Needs assistance ?Is this a change from baseline?: Change from baseline, expected to last >3 days ?Walks in Home: Needs assistance ?Is this a change from baseline?:  Change from baseline, expected to last >3 days ?Does the patient have difficulty walking or climbing stairs?: Yes ?Weakness of Legs: Both ?Weakness of Arms/Hands: Both ? ?  ?  ? ?Admission diagnosis:  Spinal cord compression ([place]) [G95.20] ?Bilateral leg weakness [R29.898] ?Malignant neoplasm of lower lobe of right lung ([place]) [C34.31] ?Weakness of both lower extremities [R29.898] ?Patient Active Problem List  ? Diagnosis Date Noted  ? Abnormal MRI of head 04/21/2021  ? Cancer related pain 04/21/2021  ? Hemoptysis 04/21/2021  ? Hypokalemia 04/20/2021  ? Protein-calorie malnutrition, severe 04/20/2021  ? Bilateral leg weakness 04/19/2021  ? Spinal cord compression ([place]) 04/19/2021  ? Malignant neoplasm of lower lobe of right lung ([place]) 04/19/2021  ? COPD (chronic obstructive pulmonary disease) with chronic bronchitis ([place]) 04/19/2021  ? HTN (hypertension) 04/19/2021  ? ?PCP:  [name], MD ?Pharmacy:   ?[id number] - [place], [place] - [address] ?[place] ?[place] [address] ?Phone: [phone] Fax: [phone] ? ? ? ? ?Social Determinants of Health (SDOH) Interventions ?  ? ?Readmission Risk Interventions ?   ? View : No data to display.  ?  ?  ?  ? ? ? ?

## 2021-04-22 NOTE — Progress Notes (Signed)
ANTICOAGULATION CONSULT NOTE - Initial Consult ? ?Pharmacy Consult for Heparin ?Indication:  R IJ thrombus ? ?No Known Allergies ? ?Patient Measurements: ?Height: 5\' 5"  (165.1 cm) ?Weight: 43.4 kg (95 lb 10.9 oz) ?IBW/kg (Calculated) : 57 ?Heparin Dosing Weight: 43.4 kg ? ?Vital Signs: ?Temp: 98.3 ?F (36.8 ?C) (04/06 0457) ?Temp Source: Oral (04/06 0457) ?BP: 154/71 (04/06 0457) ?Pulse Rate: 61 (04/06 0457) ? ?Labs: ?Recent Labs  ?  04/20/21 ?8338 04/21/21 ?0518 04/22/21 ?2505  ?HGB 10.6* 9.2* 9.8*  ?HCT 32.5* 26.9* 29.6*  ?PLT 386 334 417*  ?CREATININE 0.64 0.66 0.54  ? ? ?Estimated Creatinine Clearance: 42.3 mL/min (by C-G formula based on SCr of 0.54 mg/dL). ? ? ?Medical History: ?Past Medical History:  ?Diagnosis Date  ? COPD (chronic obstructive pulmonary disease) with chronic bronchitis ([place])   ? HTN (hypertension)   ? Pneumothorax   ? ? ?Assessment: ?Active Problem(s): difficulty ambulating and neck/shoulder pain, weight loss, hemoptysis ? ?PMH: HTN, COPD, and suspected lung cancer  ? ?AC/Heme: right IJ thrombus. IV heparin, no bolus. ?- Hgb 9.8, Plts 417 ? ?Goal of Therapy:  ?Heparin level 0.3-0.7 units/ml ?Monitor platelets by anticoagulation protocol: Yes ?  ?Plan:  ?IV heparin (no bolus) 700 units/hr ?Check heparin level in 6 hrs ?Daily HL and CBC ?DOAC in 24-48hr ? ? ?Allyana Vogan S. Alford Highland, PharmD, BCPS ?Clinical Staff Pharmacist ?[email or web address] ?Alford Highland, [employer] ?04/22/2021,9:12 AM ? ? ?

## 2021-04-22 NOTE — Evaluation (Signed)
Occupational Therapy Evaluation ?Patient Details ?Name: Anna Finley ?MRN: [id number] ?DOB: 1945/06/29 ?Today's Date: 04/22/2021 ? ? ?History of Present Illness Patient is 76 y.o. female with recent diagnosis of recent diagnosis of Rt lung cancer. Pt developed significant weight loss hemoptysis with worsening cough and due to these new findings of weakness ambulation patient has been considered for admission to hospital for further evaluation. MRI revealed  T3 compression fracture and bulky tumor in upper thoracic spinal canal causing cord compression.  follow up MRI revealed additional T12, T9 lesions. PMH significant for HTn, COPD, THA.  ? ?Clinical Impression ?  ?Anna Finley is a 76 year old woman admitted to hospital with above medical history and presents with decreased strength, sensation and coordination of Les and impaired balance resulting in a sudden decline in ability ambulate and perform ADLs. On evaluation she is modified independent with bed mobility using upper extremities but mod assist to stand and needing +2 assistance to safely attempt steps. Patient's lower extremities are severely ataxic and scissoring just in standing with walker. Patient able to perform bathing and dressing at bed level with setup but needs +2 assistance for any sit to stand with ADLs - including toileting and toilet transfer. Patient will benefit from skilled OT services while in hospital to improve deficits and learn compensatory strategies as needed in order to improve functional abilities and reduce caregiver burden. Family reports she will go home at discharge with family and are agreeable to home health. ? ?After standing attempt patient returned to seated position and therapist had patient don underwear and then tied her gown back so patient could see her legs with attempts at step towards head of bed. Patient still significantly ataxic even with increased visual input. Therapist had patient work on LE control in  sitting and back in bed using targets for patient to touch. Patient verbalized understanding of activity and reports she will work on it while in room.  ?   ? ?Recommendations for follow up therapy are one component of a multi-disciplinary discharge planning process, led by the attending physician.  Recommendations may be updated based on patient status, additional functional criteria and insurance authorization.  ? ?Follow Up Recommendations ? Home health OT  ?  ?Assistance Recommended at Discharge Frequent or constant Supervision/Assistance  ?Patient can return home with the following A lot of help with walking and/or transfers;A lot of help with bathing/dressing/bathroom;Assistance with cooking/housework;Help with stairs or ramp for entrance;Assist for transportation ? ?  ?Functional Status Assessment ? Patient has had a recent decline in their functional status and demonstrates the ability to make significant improvements in function in a reasonable and predictable amount of time.  ?Equipment Recommendations ? BSC/3in1;Tub/shower bench  ?  ?Recommendations for Other Services   ? ? ?  ?Precautions / Restrictions Precautions ?Precautions: Fall ?Precaution Comments: A LOT of falls per daughter ?Restrictions ?Weight Bearing Restrictions: No  ? ?  ? ?Mobility Bed Mobility ?  ?  ?  ?  ?  ?  ?  ?  ?  ? ?Transfers ?  ?  ?  ?  ?  ?  ?  ?  ?  ?  ?  ? ?  ?Balance Overall balance assessment: Needs assistance ?Sitting-balance support: No upper extremity supported, Feet supported ?Sitting balance-Leahy Scale: Good ?  ?  ?Standing balance support: Reliant on assistive device for balance, During functional activity, Bilateral upper extremity supported ?Standing balance-Leahy Scale: Poor ?  ?  ?  ?  ?  ?  ?  ?  ?  ?  ?  ?  ?   ? ?  ADL either performed or assessed with clinical judgement  ? ?ADL Overall ADL's : Needs assistance/impaired ?Eating/Feeding: Independent ?  ?Grooming: Set up;Sitting ?  ?Upper Body Bathing: Set  up;Sitting ?  ?Lower Body Bathing: Set up;Bed level ?  ?Upper Body Dressing : Set up;Sitting ?  ?Lower Body Dressing: Set up;Bed level ?Lower Body Dressing Details (indicate cue type and reason): able to don socks in bed ?Toilet Transfer: +2 for safety/equipment;+2 for physical assistance;Stand-pivot;BSC/3in1 ?  ?Toileting- Clothing Manipulation and Hygiene: Moderate assistance;Sitting/lateral lean ?  ?  ?  ?Functional mobility during ADLs: +2 for physical assistance;+2 for safety/equipment;Rolling walker (2 wheels) ?General ADL Comments: Mod I for bed mobility using upper body strength. Sit to stand is mod assist. +2 assistance to take steps up the bed but patient's LEs very ataxic.  ? ? ? ?Vision   ?Vision Assessment?: No apparent visual deficits  ?   ?Perception   ?  ?Praxis   ?  ? ?Pertinent Vitals/Pain Pain Assessment ?Pain Assessment: No/denies pain  ? ? ? ?Hand Dominance Right ?  ?Extremity/Trunk Assessment Upper Extremity Assessment ?Upper Extremity Assessment: RUE deficits/detail;LUE deficits/detail ?RUE Deficits / Details: overall good strength, approx 4/5 throughout, slow and slightly impaired finger to nose bil. ?RUE Sensation: WNL ?RUE Coordination:  (grossly functional) ?LUE Deficits / Details: overall good strength, approx 4/5 throughout, slow and slightly impaired finger to nose bil. ?LUE Sensation: WNL ?LUE Coordination:  (grossly functional) ?  ?Lower Extremity Assessment ?Lower Extremity Assessment: Defer to PT evaluation ?  ?Cervical / Trunk Assessment ?Cervical / Trunk Assessment: Normal ?Cervical / Trunk Exceptions: spinal lesions, pt frail ?  ?Communication Communication ?Communication: No difficulties ?  ?Cognition Arousal/Alertness: Awake/alert ?Behavior During Therapy: [facility] for tasks assessed/performed ?Overall Cognitive Status: Within Functional Limits for tasks assessed ?  ?  ?  ?  ?  ?  ?  ?  ?  ?  ?  ?  ?  ?  ?  ?  ?  ?  ?  ?General Comments    ? ?  ?Exercises Other Exercises ?Other  Exercises: worked on controlling LE movement with use of targets at edge of bed and in bed ?  ?Shoulder Instructions    ? ? ?Home Living Family/patient expects to be discharged to:: Private residence ?Living Arrangements: Children;Other relatives (grandkids) ?Available Help at Discharge: Family;Available 24 hours/day ?Type of Home: House ?Home Access: Stairs to enter ?Entrance Stairs-Number of Steps: 8 ?Entrance Stairs-Rails: Left ?Home Layout: Two level;Full bath on main level;Able to live on main level with bedroom/bathroom ?Alternate Level Stairs-Number of Steps: pt stays on main level ?  ?Bathroom Shower/Tub: Tub/shower unit ?  ?Bathroom Toilet: Standard ?Bathroom Accessibility: Yes ?  ?Home Equipment: Conservation officer, nature (2 wheels);Shower seat ?  ?  ?  ? ?  ?Prior Functioning/Environment Prior Level of Function : Independent/Modified Independent ?  ?  ?  ?  ?  ?  ?Mobility Comments: pt developed increased weakness starting ~2 weeks ago. prior to this pt was independent with mobility with no device. in last 2 weeks pt's daugther reports she has been helping pt stand and walk with cane. ?ADLs Comments: pt was independent PTA but increasingly unsteady with mobility in last 2 weeks. ?  ? ?  ?  ?OT Problem List: Decreased strength;Decreased activity tolerance;Impaired balance (sitting and/or standing);Decreased safety awareness;Decreased coordination;Decreased knowledge of use of DME or AE;Decreased knowledge of precautions;Pain;Impaired sensation ?  ?   ?OT Treatment/Interventions: Self-care/ADL training;Therapeutic exercise;DME and/or AE instruction;Therapeutic activities;Balance training;Patient/family education  ?  ?  OT Goals(Current goals can be found in the care plan section) Acute Rehab OT Goals ?Patient Stated Goal: walk safely to bathroom ?OT Goal Formulation: With patient ?Time For Goal Achievement: 05/06/21 ?Potential to Achieve Goals: Fair  ?OT Frequency: Min 2X/week ?  ? ?Co-evaluation   ?  ?  ?  ?  ? ?   ?AM-PAC OT "6 Clicks" Daily Activity     ?Outcome Measure Help from another person eating meals?: None ?Help from another person taking care of personal grooming?: A Little ?Help from another person toileting, which in

## 2021-04-23 ENCOUNTER — Ambulatory Visit
Admit: 2021-04-23 | Discharge: 2021-04-23 | Disposition: A | Discharge status: Home / Self Care | Payer: Medicare Other | Attending: Radiation Oncology | Admitting: Radiation Oncology

## 2021-04-23 ENCOUNTER — Inpatient Hospital Stay (HOSPITAL_COMMUNITY): Payer: Medicare Other

## 2021-04-23 DIAGNOSIS — G9520 Unspecified cord compression: Secondary | ICD-10-CM | POA: Diagnosis not present

## 2021-04-23 DIAGNOSIS — C3431 Malignant neoplasm of lower lobe, right bronchus or lung: Secondary | ICD-10-CM

## 2021-04-23 LAB — COMPREHENSIVE METABOLIC PANEL
ALT: 17 U/L (ref 0–44)
AST: 19 U/L (ref 15–41)
Albumin: 3.1 g/dL — ABNORMAL LOW (ref 3.5–5.0)
Alkaline Phosphatase: 207 U/L — ABNORMAL HIGH (ref 38–126)
Anion gap: 8 (ref 5–15)
BUN: 18 mg/dL (ref 8–23)
CO2: 28 mmol/L (ref 22–32)
Calcium: 8.8 mg/dL — ABNORMAL LOW (ref 8.9–10.3)
Chloride: 96 mmol/L — ABNORMAL LOW (ref 98–111)
Creatinine, Ser: 0.68 mg/dL (ref 0.44–1.00)
GFR, Estimated: >60 mL/min (ref >60)
Glucose, Bld: 141 mg/dL — ABNORMAL HIGH (ref 70–99)
Potassium: 3.9 mmol/L (ref 3.5–5.1)
Sodium: 132 mmol/L — ABNORMAL LOW (ref 135–145)
Total Bilirubin: 0.3 mg/dL (ref 0.3–1.2)
Total Protein: 6.4 g/dL — ABNORMAL LOW (ref 6.5–8.1)

## 2021-04-23 LAB — PHOSPHORUS: Phosphorus: 4 mg/dL (ref 2.5–4.6)

## 2021-04-23 LAB — MAGNESIUM: Magnesium: 2 mg/dL (ref 1.7–2.4)

## 2021-04-23 LAB — CBC WITH DIFFERENTIAL/PLATELET
Abs Immature Granulocytes: 0.06 10*3/uL (ref 0.00–0.07)
Basophils Absolute: 0 10*3/uL (ref 0.0–0.1)
Basophils Relative: 0 %
Eosinophils Absolute: 0 10*3/uL (ref 0.0–0.5)
Eosinophils Relative: 0 %
HCT: 32.9 % — ABNORMAL LOW (ref 36.0–46.0)
Hemoglobin: 10.4 g/dL — ABNORMAL LOW (ref 12.0–15.0)
Immature Granulocytes: 0 %
Lymphocytes Relative: 14 %
Lymphs Abs: 2.3 10*3/uL (ref 0.7–4.0)
MCH: 28.2 pg (ref 26.0–34.0)
MCHC: 31.6 g/dL (ref 30.0–36.0)
MCV: 89.2 fL (ref 80.0–100.0)
Monocytes Absolute: 1.2 10*3/uL — ABNORMAL HIGH (ref 0.1–1.0)
Monocytes Relative: 7 %
Neutro Abs: 13 10*3/uL — ABNORMAL HIGH (ref 1.7–7.7)
Neutrophils Relative %: 79 %
Platelets: 451 10*3/uL — ABNORMAL HIGH (ref 150–400)
RBC: 3.69 MIL/uL — ABNORMAL LOW (ref 3.87–5.11)
RDW: 16.8 % — ABNORMAL HIGH (ref 11.5–15.5)
WBC: 16.6 10*3/uL — ABNORMAL HIGH (ref 4.0–10.5)
nRBC: 0 % (ref 0.0–0.2)

## 2021-04-23 LAB — GLUCOSE, CAPILLARY
Glucose-Capillary: 165 mg/dL — ABNORMAL HIGH (ref 70–99)
Glucose-Capillary: 139 mg/dL — ABNORMAL HIGH (ref 70–99)
Glucose-Capillary: 133 mg/dL — ABNORMAL HIGH (ref 70–99)
Glucose-Capillary: 257 mg/dL — ABNORMAL HIGH (ref 70–99)

## 2021-04-23 LAB — TROPONIN I (HIGH SENSITIVITY)
Troponin I (High Sensitivity): 33 ng/L — ABNORMAL HIGH (ref <18)
Troponin I (High Sensitivity): 34 ng/L — ABNORMAL HIGH (ref <18)

## 2021-04-23 IMAGING — DX DG CHEST 1V PORT
1 series · 1 of 1 positions shown · non-contrast
Comparison: Chest radiograph [DATE], CT chest [DATE]

CLINICAL DATA: Weakness, status post bronchoscopy 2 days ago.
Shortness of breath

EXAM:
PORTABLE CHEST 1 VIEW

[chest ap]
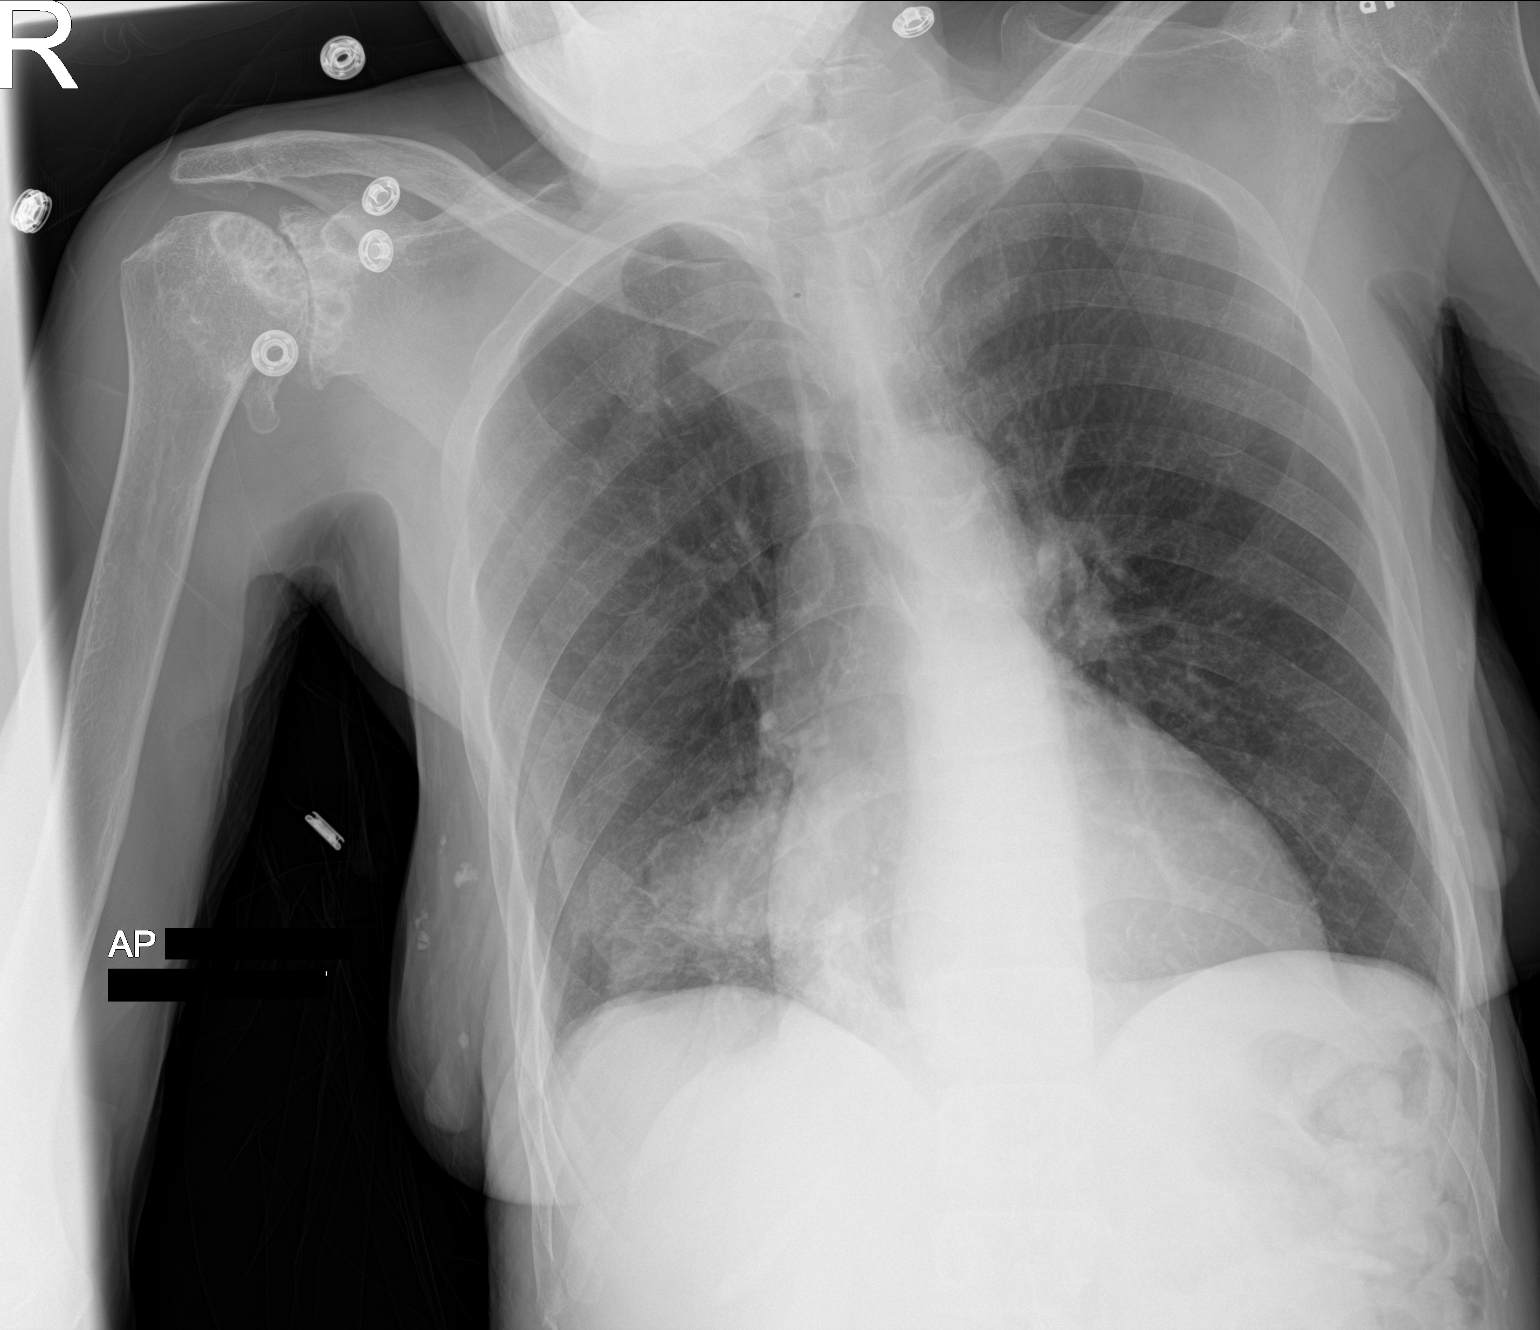

[1 of 1 positions shown; findings below may reference images not displayed]

FINDINGS: The cardiomediastinal silhouette is stable.

The large right lower lobe mass measuring approximately 4.5 cm is
unchanged. The lungs are otherwise clear. There is no new or
worsening focal airspace disease. There is no pleural effusion.
There is no pneumothorax

There is advanced degenerative change of the right shoulder. There
is no acute osseous abnormality.
IMPRESSION: Unchanged right lower lobe mass. No new or worsening focal airspace
disease. No pneumothorax.

## 2021-04-23 MED ORDER — CALCIUM CARBONATE ANTACID 500 MG PO CHEW
1.0000 | CHEWABLE_TABLET | Freq: Three times a day (TID) | ORAL | Status: DC | PRN
  Administered 2021-04-23: 200 mg via ORAL
  Filled 2021-04-23: qty 1

## 2021-04-23 NOTE — Progress Notes (Signed)
Physical Therapy Treatment ?Patient Details ?Name: Anna Finley ?MRN: [id number] ?DOB: 05-Sep-1945 ?Today's Date: 04/23/2021 ? ? ?History of Present Illness Patient is 76 y.o. female with recent diagnosis of recent diagnosis of Rt lung cancer. Pt developed significant weight loss hemoptysis with worsening cough and due to these new findings of weakness ambulation patient has been considered for admission to hospital for further evaluation. MRI revealed  T3 compression fracture and bulky tumor in upper thoracic spinal canal causing cord compression.  follow up MRI revealed additional T12, T9 lesions. PMH significant for HTn, COPD, THA. ? ?  ?PT Comments  ? ? Pt is slowly progressing toward acute PT goals this session with slight increase in ambulation distance. Pt ambulated ~31ft with use of RW, continues to require MOD-MAX A (with +2 for safety and additional person with chair follow) for stability due to ataxic gait putting pt at high risk for falls. Continue to recommend acute inpatient rehab upon d/c, if pt/family decline, pt will need recommended equipment (see equipment recommendations below) and assist from family for limited ambulation distance and performance of transfers due to current deficits, would recommend at least +2 assist at home to maximize safety with OOB. Pt will benefit from continued skilled PT to increase independence and maximize safety with mobility as well as continued family education.  ?   ?Recommendations for follow up therapy are one component of a multi-disciplinary discharge planning process, led by the attending physician.  Recommendations may be updated based on patient status, additional functional criteria and insurance authorization. ? ?Follow Up Recommendations ? Acute inpatient rehab (3hours/day) ?  ?  ?Assistance Recommended at Discharge Frequent or constant Supervision/Assistance  ?Patient can return home with the following A lot of help with walking and/or transfers;A lot of  help with bathing/dressing/bathroom;Assistance with cooking/housework;Direct supervision/assist for medications management;Assist for transportation;Help with stairs or ramp for entrance ?  ?Equipment Recommendations ? Wheelchair (measurements PT);Wheelchair cushion (measurements PT);Rolling walker (2 wheels);BSC/3in1 (per eval, pt owns RW and BSC. Recommended if pt decides to go home vs. rehab upon d/c)  ?  ?Recommendations for Other Services   ? ? ?  ?Precautions / Restrictions Precautions ?Precautions: Fall ?Precaution Comments: A LOT of falls per daughter ?Restrictions ?Weight Bearing Restrictions: No  ?  ? ?Mobility ? Bed Mobility ?  ?  ?  ?  ?  ?  ?  ?General bed mobility comments: Pt sitting on BSC upon entry. Daughter present in room ?  ? ?Transfers ?Overall transfer level: Needs assistance ?Equipment used: Rolling walker (2 wheels) ?Transfers: Sit to/from Stand ?Sit to Stand: Min assist ?  ?  ?  ?  ?  ?General transfer comment: x1 from [facility]. x4 from recliner chair for transfer training with emphasis on optimal LEplacement, scooting hips forward toward edge for ease of transfer, and proper hand placement with sit to stand and stand to sit transfers. Pt R knee falling into valgus, verbal/tactile cues for correction prior to standing. Min assist to steady, pt locking knees in hyperextension to stabilize self and slight anterior lean noted. ?  ? ?Ambulation/Gait ?Ambulation/Gait assistance: Mod assist, Max assist, +2 safety/equipment ?Gait Distance (Feet): 66 Feet ?Assistive device: Rolling walker (2 wheels) ?Gait Pattern/deviations: Step-through pattern, Decreased stride length, Knees buckling, Scissoring, Ataxic, Drifts right/left, Narrow base of support ?Gait velocity: decr ?  ?  ?General Gait Details: fluctuating Mod-Max assist to stabilize balance with RW. Pt with Narrow BOS and scissoring steps. Demonstrating poor motor control/planning, increased difficulty with sequencing of R  LE>L. Pt intermittently  with long stride length leading to staggered stance and posterior leaning. Cues for step to gait pattern and smaller steps, mild improvement noted in stability, but with difficulty maintaining with every step. intermittent B knee buckling. ? ? ?Stairs ?  ?  ?  ?  ?  ? ? ?Wheelchair Mobility ?  ? ?Modified Rankin (Stroke Patients Only) ?  ? ? ?  ?Balance Overall balance assessment: History of Falls, Needs assistance ?Sitting-balance support: Feet supported ?Sitting balance-Leahy Scale: Fair ?  ?  ?Standing balance support: Reliant on assistive device for balance, During functional activity, Bilateral upper extremity supported ?Standing balance-Leahy Scale: Poor ?  ?  ?  ?  ?  ?  ?  ?  ?  ?  ?  ?  ?  ? ?  ?Cognition Arousal/Alertness: Awake/alert ?Behavior During Therapy: [facility] for tasks assessed/performed ?Overall Cognitive Status: Within Functional Limits for tasks assessed ?  ?  ?  ?  ?  ?  ?  ?  ?  ?  ?  ?  ?  ?  ?  ?  ?  ?  ?  ? ?  ?Exercises   ? ?  ?General Comments   ?  ?  ? ?Pertinent Vitals/Pain Pain Assessment ?Pain Assessment: No/denies pain  ? ? ?Home Living   ?  ?  ?  ?  ?  ?  ?  ?  ?  ?   ?  ?Prior Function    ?  ?  ?   ? ?PT Goals (current goals can now be found in the care plan section) Acute Rehab PT Goals ?Patient Stated Goal: increase independence and get home to family ?PT Goal Formulation: With patient/family ?Time For Goal Achievement: 05/05/21 ?Potential to Achieve Goals: Good ?Progress towards PT goals: Progressing toward goals ? ?  ?Frequency ? ? ? Min 3X/week ? ? ? ?  ?PT Plan Current plan remains appropriate  ? ? ?Co-evaluation   ?  ?  ?  ?  ? ?  ?AM-PAC PT "6 Clicks" Mobility   ?Outcome Measure ? Help needed turning from your back to your side while in a flat bed without using bedrails?: A Little ?Help needed moving from lying on your back to sitting on the side of a flat bed without using bedrails?: A Little ?Help needed moving to and from a bed to a chair (including a wheelchair)?: A  Lot ?Help needed standing up from a chair using your arms (e.g., wheelchair or bedside chair)?: A Lot ?Help needed to walk in hospital room?: A Lot ?Help needed climbing 3-5 steps with a railing? : Total ?6 Click Score: 13 ? ?  ?End of Session Equipment Utilized During Treatment: Gait belt ?Activity Tolerance: Patient tolerated treatment well ?Patient left: in chair;with call bell/phone within reach;with chair alarm set;with family/visitor present ?Nurse Communication: Mobility status ?PT Visit Diagnosis: Unsteadiness on feet (R26.81);Other abnormalities of gait and mobility (R26.89);Muscle weakness (generalized) (M62.81);Difficulty in walking, not elsewhere classified (R26.2);Other symptoms and signs involving the nervous system (R29.898);History of falling (Z91.81);Ataxic gait (R26.0) ?  ? ? ?Time: 9935-7017 ?PT Time Calculation (min) (ACUTE ONLY): 24 min ? ?Charges:  $Gait Training: 8-22 mins ?$Therapeutic Activity: 8-22 mins          ?          ? ?Festus Barren., PT, DPT  ?Acute Rehabilitation Services  ?Office [phone] ? ?04/23/2021, 1:59 PM ? ?

## 2021-04-23 NOTE — Progress Notes (Signed)
?PROGRESS NOTE ? ? ? ?Anna Finley  IPJ:[id number] DOB: 1945-07-21 DOA: 04/18/2021 ?PCP: [name], MD  ?Chief Complaint  ?Patient presents with  ? Gait Problem  ? ? ?Brief Narrative:  ?76 yo with hx HTN, COPD, and suspected lung cancer based on imaging who was planned for outpatient PET/biopsy presented in interim with difficulty ambulated and neck/shoulder pain.  Due to issues with ambulation, weight loss, hemoptysis she presented to the ED and was found to have metastatic deposit in T3 vertebrae with cord compression. ? ?See below for additional details  ? ? ?Assessment & Plan: ?  ?Principal Problem: ?  Spinal cord compression ([place]) ?Active Problems: ?  Malignant neoplasm of lower lobe of right lung ([place]) ?  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  Hemoptysis ?  COPD (chronic obstructive pulmonary disease) with chronic bronchitis ([place]) ?  Cancer related pain ?  HTN (hypertension) ?  Protein-calorie malnutrition, severe ?  Goals of care, counseling/discussion ?  Hypokalemia ? ? ?Assessment and Plan: ? ?* Spinal cord compression ([facility]) ?Patient presenting with B leg weakness, dfficulty ambulating ?MRI C spine with pathologic compression fx of T3 with bulky enhancing tumor in the upper thoracic spinal cord canal and spinal cord compression, possible mild T3 level cord edema.  Tumor obliterating bilateral T3 and L T2 neural foramina. ?MRI T/L spine with metastatic deposit in T3 verebra with epidural tumor compressing the cord and infiltrating the L B3-4 foramen, uncomplicated metastatic disease at T9, T12, L4, and sacrum.  Degenerative spinal stenosis at L4-5, degenerative bilateral foraminal impingement at L4-5 and L5-S1. ?MRI brain with no intracranial metastatic disease or acute intracranial abnormality ?Neurosurgery recommending proceed with radiation if radiosensitive tumor, could consider cytoreductive surgery if not radiosensitive ?Rad onc planning for 10 fractions of  radiotherapy to thoracic spine (started 4/5 - refused 4/6) ?Dexamethasone ?Oncology planning to follow up outpatient after PET scan ? 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 ?Hemoptysis ?Continued overnight ?She's receiving radiation to lung mass as well (discussed with oncology/rad onc today) ?Hopefully this will improve to allow [place] the ability to start anticoagulation ? ?Cancer related pain ?Morphine, oxy ? ?COPD (chronic obstructive pulmonary disease) with chronic bronchitis ([place]) ?Albuterol, dulera, singulair  ? ?HTN (hypertension) ?Atenolol, chlorthalidone ? ?Goals of care, counseling/discussion ?More agreeable with radiation today ?Will continue to discuss care plan and next steps based on what's important to her ? ? ?DVT prophylaxis: SCD ?Code Status: full ?Family Communication: daughter on phone ?Disposition:  ? ?Status is: Inpatient ?Remains inpatient appropriate because: need for radiation therapy, oncology c/s, therapy ?  ?Consultants:  ?PCCM ?Rad onc ?neurosurgery ? ?Procedures:  ?Flexible bronchoscopy and EBUS Bronchoscopy ? ?Antimicrobials:  ?Anti-infectives (From admission, onward)  ? ? None  ? ?  ? ? ?Subjective: ?Hemoptysis overnight ? ?Objective: ?Vitals:  ? 04/22/21 2017 04/23/21 0502 04/23/21  0757 04/23/21 1056  ?BP: (!) 133/57 140/66  Marland Kitchen)  164/68  ?Pulse: 66 (!) 59  (!) 59  ?Resp: 18 16    ?Temp: 98.7 ?F (37.1 ?C) 98.2 ?F (36.8 ?C)  98.9 ?F (37.2 ?C)  ?TempSrc: Oral Oral  Oral  ?SpO2: 96% 97% 97% 99%  ?Weight:      ?Height:      ? ? ?Intake/Output Summary (Last 24 hours) at 04/23/2021 1849 ?Last data filed at 04/23/2021 0900 ?Gross per 24 hour  ?Intake 360 ml  ?Output 250 ml  ?Net 110 ml  ? ?Filed Weights  ? 04/18/21 2258 04/19/21 1421 04/20/21 1232  ?Weight: 43.1 kg 43.4 kg 43.4 kg  ? ? ?Examination: ? ?General: No acute distress. ?Cardiovascular: RRR ?Lungs: unlabored ?Abdomen: Soft, nontender, nondistended ?Neurological: Alert and oriented ?3. Moves all extremities ?4.  ?Skin: Warm and dry. No rashes or lesions. ?Extremities: No clubbing or cyanosis. No edema. ? ?Data Reviewed: I have personally reviewed following labs and imaging studies ? ?CBC: ?Recent Labs  ?Lab 04/18/21 ?2336 04/20/21 ?7209 04/21/21 ?0518 04/22/21 ?4709 04/23/21 ?6283  ?WBC 9.7 7.6 11.1* 14.6* 16.6*  ?NEUTROABS 6.3  --   --  12.0* 13.0*  ?HGB 9.9* 10.6* 9.2* 9.8* 10.4*  ?HCT 29.9* 32.5* 26.9* 29.6* 32.9*  ?MCV 84.9 86.2 86.2 85.3 89.2  ?PLT 376 386 334 417* 451*  ? ? ?Basic Metabolic Panel: ?Recent Labs  ?Lab 04/18/21 ?2336 04/20/21 ?6629 04/21/21 ?0518 04/22/21 ?4765 04/23/21 ?4650  ?NA 134* 133* 133* 133* 132*  ?K 3.4* 4.2 4.2 4.0 3.9  ?CL 99 97* 98 97* 96*  ?CO2 _0 ?GLUCOSE 135* 150* 149* 128* 141*  ?BUN _1 ?CREATININE 0.91 0.64 0.66 0.54 0.68  ?CALCIUM 8.8* 9.0 8.4* 9.0 8.8*  ?MG  --   --  1.8 2.0 2.0  ?PHOS  --   --   --  3.8 4.0  ? ? ?GFR: ?Estimated Creatinine Clearance: 42.3 mL/min (by C-G formula based on SCr of 0.68 mg/dL). ? ?Liver Function Tests: ?Recent Labs  ?Lab 04/22/21 ?3546 04/23/21 ?5681  ?AST 18 19  ?ALT 14 17  ?ALKPHOS 191* 207*  ?BILITOT 0.5 0.3  ?PROT 6.3* 6.4*  ?ALBUMIN 3.1* 3.1*  ? ? ?CBG: ?Recent Labs  ?Lab 04/22/21 ?1751 04/22/21 ?2022 04/23/21 ?0735 04/23/21 ?1203 04/23/21 ?1656   ?GLUCAP 237* 102* 165* 139* 133*  ? ? ? ?No results found for this or any previous visit (from the past 240 hour(s)).  ? ? ? ? ? ?Radiology Studies: ?CT ANGIO NECK W OR WO CONTRAST ? ?Result Date: 04/21/2021 ?CLINICAL DATA:  abnormal MRI, concern for jugular vein thrombosis vs right carotid tumor or malignant LN. EXAM: CT ANGIOGRAPHY NECK TECHNIQUE: Multidetector CT imaging of the neck was performed using the standard protocol during bolus administration of intravenous contrast. Multiplanar CT image reconstructions and MIPs were obtained to evaluate the vascular anatomy. Carotid stenosis measurements (when applicable) are obtained utilizing NASCET criteria, using the distal internal carotid diameter as the denominator. RADIATION DOSE REDUCTION: This exam was performed according to the departmental dose-optimization program which includes automated exposure control, adjustment of the mA and/or kV according to patient size and/or use of iterative reconstruction technique. CONTRAST:  49m OMNIPAQUE IOHEXOL 350 MG/ML SOLN COMPARISON:  None. FINDINGS: Aortic arch: Standard branching. Imaged portion shows no evidence of aneurysm or dissection. No significant stenosis of the major arch vessel origins. Calcific aortic atherosclerosis. Right carotid system: There is mild calcific atherosclerosis at the carotid bifurcation. No stenosis. Left carotid system:  There is calcific atherosclerosis at the carotid bifurcation without hemodynamically significant stenosis. Vertebral arteries: Codominant. Skeleton: Negative Other neck: There is nonocclusive thrombus within [place]a [place] short segment of the right internal jugular vein just inferior to the skull base. Upper chest: Negative IMPRESSION: 1. Bilateral carotid bifurcation atherosclerosis without hemodynamically significant stenosis. 2. Nonocclusive short segment thrombus within the right internal jugular vein just inferior to the skull base. Aortic Atherosclerosis (ICD10-I70.0).  Electronically Signed   By: Ulyses Jarred M.D.   On: 04/21/2021 21:19  ? ?DG CHEST PORT 1 VIEW ? ?Result Date: 04/23/2021 ?CLINICAL DATA:  Weakness, status post bronchoscopy 2 days ago. Shortness of breath EXAM: PORTABLE CHE

## 2021-04-23 NOTE — Care Management Important Message (Signed)
Important Message ? ?Patient Details IM Letter placed in Patients room. ?Name: Anna Finley ?MRN: [id number] ?Date of Birth: 1945/09/26 ? ? ?Medicare Important Message Given:  Yes ? ? ? ? ?Kerin Salen ?04/23/2021, 11:20 AM ?

## 2021-04-23 NOTE — Progress Notes (Signed)
Bilateral lower extremity venous duplex has been completed. ?Preliminary results can be found in CV Proc through chart review.  ? ?04/23/21 2:43 PM ?Carlos Levering RVT   ?

## 2021-04-24 DIAGNOSIS — G9520 Unspecified cord compression: Secondary | ICD-10-CM | POA: Diagnosis not present

## 2021-04-24 LAB — COMPREHENSIVE METABOLIC PANEL
ALT: 20 U/L (ref 0–44)
AST: 17 U/L (ref 15–41)
Albumin: 3.2 g/dL — ABNORMAL LOW (ref 3.5–5.0)
Alkaline Phosphatase: 203 U/L — ABNORMAL HIGH (ref 38–126)
Anion gap: 10 (ref 5–15)
BUN: 18 mg/dL (ref 8–23)
CO2: 28 mmol/L (ref 22–32)
Calcium: 8.9 mg/dL (ref 8.9–10.3)
Chloride: 94 mmol/L — ABNORMAL LOW (ref 98–111)
Creatinine, Ser: 0.69 mg/dL (ref 0.44–1.00)
GFR, Estimated: >60 mL/min (ref >60)
Glucose, Bld: 138 mg/dL — ABNORMAL HIGH (ref 70–99)
Potassium: 4.2 mmol/L (ref 3.5–5.1)
Sodium: 132 mmol/L — ABNORMAL LOW (ref 135–145)
Total Bilirubin: 0.4 mg/dL (ref 0.3–1.2)
Total Protein: 6.6 g/dL (ref 6.5–8.1)

## 2021-04-24 LAB — MAGNESIUM: Magnesium: 2.1 mg/dL (ref 1.7–2.4)

## 2021-04-24 LAB — GLUCOSE, CAPILLARY
Glucose-Capillary: 146 mg/dL — ABNORMAL HIGH (ref 70–99)
Glucose-Capillary: 118 mg/dL — ABNORMAL HIGH (ref 70–99)
Glucose-Capillary: 157 mg/dL — ABNORMAL HIGH (ref 70–99)
Glucose-Capillary: 162 mg/dL — ABNORMAL HIGH (ref 70–99)

## 2021-04-24 LAB — CBC WITH DIFFERENTIAL/PLATELET
Abs Immature Granulocytes: 0.1 10*3/uL — ABNORMAL HIGH (ref 0.00–0.07)
Basophils Absolute: 0 10*3/uL (ref 0.0–0.1)
Basophils Relative: 0 %
Eosinophils Absolute: 0 10*3/uL (ref 0.0–0.5)
Eosinophils Relative: 0 %
HCT: 34.1 % — ABNORMAL LOW (ref 36.0–46.0)
Hemoglobin: 11.3 g/dL — ABNORMAL LOW (ref 12.0–15.0)
Immature Granulocytes: 1 %
Lymphocytes Relative: 13 %
Lymphs Abs: 2 10*3/uL (ref 0.7–4.0)
MCH: 28.3 pg (ref 26.0–34.0)
MCHC: 33.1 g/dL (ref 30.0–36.0)
MCV: 85.5 fL (ref 80.0–100.0)
Monocytes Absolute: 0.9 10*3/uL (ref 0.1–1.0)
Monocytes Relative: 6 %
Neutro Abs: 12 10*3/uL — ABNORMAL HIGH (ref 1.7–7.7)
Neutrophils Relative %: 80 %
Platelets: 527 10*3/uL — ABNORMAL HIGH (ref 150–400)
RBC: 3.99 MIL/uL (ref 3.87–5.11)
RDW: 16.7 % — ABNORMAL HIGH (ref 11.5–15.5)
WBC: 15 10*3/uL — ABNORMAL HIGH (ref 4.0–10.5)
nRBC: 0 % (ref 0.0–0.2)

## 2021-04-24 LAB — HEPARIN LEVEL (UNFRACTIONATED): Heparin Unfractionated: <0.1 IU/mL — ABNORMAL LOW (ref 0.30–0.70)

## 2021-04-24 LAB — PHOSPHORUS: Phosphorus: 4.3 mg/dL (ref 2.5–4.6)

## 2021-04-24 MED ORDER — HEPARIN (PORCINE) 25000 UT/250ML-% IV SOLN
700.0000 [IU]/h | INTRAVENOUS | Status: DC
  Administered 2021-04-24: 700 [IU]/h via INTRAVENOUS
  Filled 2021-04-24: qty 250

## 2021-04-24 NOTE — Progress Notes (Signed)
Physical Therapy Treatment ?Patient Details ?Name: Anna Finley ?MRN: [id number] ?DOB: [birth year] ?Today's Date: 04/24/2021 ? ? ?History of Present Illness Patient is 76 y.o. female with recent diagnosis of recent diagnosis of Rt lung cancer. Pt developed significant weight loss hemoptysis with worsening cough and due to these new findings of weakness ambulation patient has been considered for admission to hospital for further evaluation. MRI revealed  T3 compression fracture and bulky tumor in upper thoracic spinal canal causing cord compression.  follow up MRI revealed additional T12, T9 lesions. PMH significant for HTN, COPD, THA. ? ?  ?PT Comments  ? ? Pt seen today for PT with focus on transfers and safety. Pt continues with significant LE and truncal ataxia placing her at risk for continued falls. Pt with near fall today with PT requiring +2 assist move pt safely to chair  (sudden bil knee buckling with anterior LOB). Would recommend 2 person assist at home and pt will likely need to function from a w/c level for safety until she is further along in her XRT. Will continue to follow in acute setting. Discussed with RN  ?Recommendations for follow up therapy are one component of a multi-disciplinary discharge planning process, led by the attending physician.  Recommendations may be updated based on patient status, additional functional criteria and insurance authorization. ? ?Follow Up Recommendations ? Home health PT (family prefers home) ?  ?  ?Assistance Recommended at Discharge Frequent or constant Supervision/Assistance  ?Patient can return home with the following Two people to help with walking and/or transfers;Two people to help with bathing/dressing/bathroom;Assistance with cooking/housework;Assistance with feeding;Direct supervision/assist for medications management;Assist for transportation;Help with stairs or ramp for entrance ?  ?Equipment Recommendations ? Wheelchair (measurements PT);Wheelchair  cushion (measurements PT);Rolling walker (2 wheels);BSC/3in1  ?  ?Recommendations for Other Services   ? ? ?  ?Precautions / Restrictions Precautions ?Precautions: Fall ?Precaution Comments: A LOT of falls per daughter ?Restrictions ?Weight Bearing Restrictions: No  ?  ? ?Mobility ? Bed Mobility ?Overal bed mobility: Needs Assistance ?Bed Mobility: Supine to Sit ?  ?  ?Supine to sit: Min assist ?  ?  ?General bed mobility comments: assist with RLE and close guarding to safely elevate trunk d/t ataxia. (pt initially moved LLE off bed and left RLE  on the bed with diminished  awareness) ?  ? ?Transfers ?Overall transfer level: Needs assistance ?Equipment used: Rolling walker (2 wheels) ?Transfers: Sit to/from Stand, Bed to chair/wheelchair/BSC ?Sit to Stand: Min assist, +2 safety/equipment ?  ?Step pivot transfers: Mod assist, Max assist, +2 physical assistance, +2 safety/equipment ?  ?  ?  ?General transfer comment: X4 from EOB, emphasis on correct hand placement, maintaining LE positioning and controlling ascent/descent; +2 assist to stand and take small steps to chair, multi-modal cues and physical assist  for foot placement, LE position, balance/midline. pt with sudden  bil knee buckling just prior to sitting (with LEs against chair ) significant anterior translation combined with buckling---> pt requiring +2 to prevent fall. ?  ? ?Ambulation/Gait ?  ?  ?  ?  ?  ?  ?  ?General Gait Details: deferred for pt safety, no a candidate for gait at this time ? ? ?Stairs ?  ?  ?  ?  ?  ? ? ?Wheelchair Mobility ?  ? ?Modified Rankin (Stroke Patients Only) ?  ? ? ?  ?Balance Overall balance assessment: History of Falls, Needs assistance ?Sitting-balance support: Feet supported ?Sitting balance-Leahy Scale: Fair ?Sitting balance - Comments: very  close supervision for safe static sitting ?  ?Standing balance support: Reliant on assistive device for balance, During functional activity, Bilateral upper extremity  supported ?Standing balance-Leahy Scale: Zero ?Standing balance comment: reliant on UEs and external support; initial difficulty maintaining midline--L Lateral lean, improved with standing trials ?  ?  ?  ?  ?  ?  ?  ?  ?  ?  ?  ?  ? ?  ?Cognition Arousal/Alertness: Awake/alert ?Behavior During Therapy: [facility] for tasks assessed/performed ?Overall Cognitive Status: Within Functional Limits for tasks assessed ?  ?  ?  ?  ?  ?  ?  ?  ?  ?  ?  ?  ?  ?  ?  ?  ?  ?  ?  ? ?  ?Exercises General Exercises - Lower Extremity ?Long Arc Quad: AROM, Both, 10 reps, Seated (visual target provided with emphasis on control/slowing speed) ? ?  ?General Comments   ?  ?  ? ?Pertinent Vitals/Pain Pain Assessment ?Pain Assessment: Faces ?Faces Pain Scale: Hurts little more ?Pain Location: upper back/shoulders (d/t positioning in XRT per pt) ?Pain Descriptors / Indicators: Grimacing, Sore ?Pain Intervention(s): Limited activity within patient's tolerance, Monitored during session, Repositioned  ? ? ?Home Living   ?  ?  ?  ?  ?  ?  ?  ?  ?  ?   ?  ?Prior Function    ?  ?  ?   ? ?PT Goals (current goals can now be found in the care plan section) Acute Rehab PT Goals ?Patient Stated Goal: increase independence and get home to family ?PT Goal Formulation: With patient/family ?Time For Goal Achievement: 05/05/21 ?Potential to Achieve Goals: Fair ?Progress towards PT goals: Progressing toward goals ? ?  ?Frequency ? ? ? Min 3X/week ? ? ? ?  ?PT Plan Current plan remains appropriate  ? ? ?Co-evaluation   ?  ?  ?  ?  ? ?  ?AM-PAC PT "6 Clicks" Mobility   ?Outcome Measure ? Help needed turning from your back to your side while in a flat bed without using bedrails?: A Little ?Help needed moving from lying on your back to sitting on the side of a flat bed without using bedrails?: A Little ?Help needed moving to and from a bed to a chair (including a wheelchair)?: Total ?Help needed standing up from a chair using your arms (e.g., wheelchair or bedside  chair)?: Total ?Help needed to walk in hospital room?: Total ?Help needed climbing 3-5 steps with a railing? : Total ?6 Click Score: 10 ? ?  ?End of Session Equipment Utilized During Treatment: Gait belt ?Activity Tolerance: Patient tolerated treatment well ?Patient left: in chair;with call bell/phone within reach;with chair alarm set;with family/visitor present ?Nurse Communication: Mobility status ?PT Visit Diagnosis: Unsteadiness on feet (R26.81);Other abnormalities of gait and mobility (R26.89);Muscle weakness (generalized) (M62.81);Difficulty in walking, not elsewhere classified (R26.2);Other symptoms and signs involving the nervous system (R29.898);History of falling (Z91.81);Ataxic gait (R26.0) ?  ? ? ?Time: 2992-4268 ?PT Time Calculation (min) (ACUTE ONLY): 24 min ? ?Charges:  $Therapeutic Activity: 8-22 mins ?$Neuromuscular Re-education: 8-22 mins          ?          ? Baxter Flattery, PT ? ?Acute Rehab Dept [facility]) [phone] ?Pager [phone] ? ?04/24/2021 ? ? ? ?Laycee Fitzsimmons ?04/24/2021, 12:25 PM ? ?

## 2021-04-24 NOTE — Progress Notes (Signed)
ANTICOAGULATION CONSULT NOTE - Follow Up Consult ? ?Pharmacy Consult for Heparin ?Indication:  R IJ thrombus ? ?No Known Allergies ? ?Patient Measurements: ?Height: 5\' 5"  (165.1 cm) ?Weight: 43.4 kg (95 lb 10.9 oz) ?IBW/kg (Calculated) : 57 ?Heparin Dosing Weight:  43.4kg ? ?Vital Signs: ?Temp: 98.1 ?F (36.7 ?C) (04/08 9201) ?Temp Source: Oral (04/08 0071) ?BP: 141/73 (04/08 2197) ?Pulse Rate: 58 (04/08 5883) ? ?Labs: ?Recent Labs  ?  04/22/21 ?2549 04/22/21 ?1603 04/23/21 ?8264 04/23/21 ?1113 04/23/21 ?1253 04/24/21 ?1583  ?HGB 9.8*  --  10.4*  --   --  11.3*  ?HCT 29.6*  --  32.9*  --   --  34.1*  ?PLT 417*  --  451*  --   --  527*  ?HEPARINUNFRC  --  <0.10*  --   --   --   --   ?CREATININE 0.54  --  0.68  --   --  0.69  ?TROPONINIHS  --   --   --  33* 34*  --   ? ? ?Estimated Creatinine Clearance: 41.6 mL/min (by C-G formula based on SCr of 0.69 mg/dL). ? ? ?Assessment: ? ?AC/Heme: right IJ thrombus-->  IV heparin,  ?- 4/5 CT neck: Nonocclusive short segment thrombus within the right internal jugular vein just inferior to the skull base ?- no bolus started on 4/6 AM, then d/ced in PM d/t hemoptysis ?- 4/8: - 4/8: Hgb 11.3 improved. RN reports not hemoptysis. MD ok to resume heparin with no bolus. ? ?Goal of Therapy:  ?Heparin level 0.3-0.5 units/ml lower goal. ?Monitor platelets by anticoagulation protocol: Yes ?  ?Plan:  ?Resume IV heparin, no bolus, at 700 units/hr ?Check heparin level in 8 hrs ?Daily HL and CBC ?F/u to start [facility] ? ? ?Laurissa Cowper S. Alford Highland, PharmD, BCPS ?Clinical Staff Pharmacist ?[email or web address] ? ?Alford Highland, [employer] ?04/24/2021,1:25 PM ? ? ?

## 2021-04-24 NOTE — Progress Notes (Signed)
?PROGRESS NOTE ? ? ? ?Anna Finley  JSH:[id number] DOB: December 29, 1945 DOA: 04/18/2021 ?PCP: [name], MD  ?Chief Complaint  ?Patient presents with  ? Gait Problem  ? ? ?Brief Narrative:  ?76 yo with hx HTN, COPD, and suspected lung cancer based on imaging who was planned for outpatient PET/biopsy presented in interim with difficulty ambulated and neck/shoulder pain.  Due to issues with ambulation, weight loss, hemoptysis she presented to the ED and was found to have metastatic deposit in T3 vertebrae with cord compression. ? ?See below for additional details  ? ? ?Assessment & Plan: ?  ?Principal Problem: ?  Spinal cord compression ([place]) ?Active Problems: ?  Malignant neoplasm of lower lobe of right lung ([place]) ?  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  Hemoptysis ?  COPD (chronic obstructive pulmonary disease) with chronic bronchitis ([place]) ?  Cancer related pain ?  HTN (hypertension) ?  Protein-calorie malnutrition, severe ?  Goals of care, counseling/discussion ?  Hypokalemia ? ? ?Assessment and Plan: ? ?* Spinal cord compression (HCC) ?Patient presenting with B leg weakness, dfficulty ambulating ?MRI C spine with pathologic compression fx of T3 with bulky enhancing tumor in the upper thoracic spinal cord canal and spinal cord compression, possible mild T3 level cord edema.  Tumor obliterating bilateral T3 and L T2 neural foramina. ?MRI T/L spine with metastatic deposit in T3 verebra with epidural tumor compressing the cord and infiltrating the L I5-0 foramen, uncomplicated metastatic disease at T9, T12, L4, and sacrum.  Degenerative spinal stenosis at L4-5, degenerative bilateral foraminal impingement at L4-5 and L5-S1. ?MRI brain with no intracranial metastatic disease or acute intracranial abnormality ?Neurosurgery recommending proceed with radiation if radiosensitive tumor, could consider cytoreductive surgery if not radiosensitive ?Rad onc planning for 10 fractions of  radiotherapy to thoracic spine (started 4/5 - refused 4/6) ?Dexamethasone ?Oncology planning to follow up outpatient after PET scan ? 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 ?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 ?CT with nonocclusive short segment thrombus within R IJ ?No intracranial metastatic disease ?No hemoptysis x24 hrs, will resume heparin at this time ? ? ?Hemoptysis ?Continued overnight ?She's receiving radiation to lung mass as well (discussed with oncology/rad onc today) ?No hemoptysis for 24 hrs ? ?Cancer related pain ?Morphine, oxy ? ?COPD (chronic obstructive pulmonary disease) with chronic bronchitis ([place]) ?Albuterol, dulera, singulair  ? ?HTN (hypertension) ?Atenolol, chlorthalidone ? ?Goals of care, counseling/discussion ?Agreeable to inpatient rehab today ?Will continue to discuss care plan and next steps based on what's important to her ? ? ?DVT prophylaxis: SCD ?Code Status: full ?Family Communication: daughter at bedside ?Disposition:  ? ?Status is: Inpatient ?Remains inpatient appropriate because: need for radiation therapy, oncology c/s, therapy ?  ?Consultants:  ?PCCM ?Rad onc ?neurosurgery ? ?Procedures:  ?Flexible bronchoscopy and EBUS Bronchoscopy ? ?Antimicrobials:  ?Anti-infectives (From admission, onward)  ? ? None  ? ?  ? ? ?Subjective: ?No hemoptysis x24 hrs ? ?Objective: ?Vitals:  ? 04/23/21 1928 04/23/21 2201 04/24/21 2774 04/24/21 1287  ?BP:  122/79  (!) 141/73  ?Pulse:  64  (!) 58  ?Resp:  18  16  ?Temp:  98.8 ?F (37.1 ?C)  98.1 ?F (36.7 ?C)  ?TempSrc:   Oral  Oral  ?SpO2: 97% 96% 96% 96%  ?Weight:      ?Height:      ? ? ?Intake/Output Summary (Last 24 hours) at 04/24/2021 1524 ?Last data filed at 04/24/2021 9528 ?Gross per 24 hour  ?Intake 120 ml  ?Output 400 ml  ?Net -280 ml  ? ?Filed Weights  ? 04/18/21 2258 04/19/21 1421 04/20/21 1232  ?Weight: 43.1 kg 43.4 kg 43.4 kg  ? ? ?Examination: ? ?General: No acute distress. ?Cardiovascular: RRR ?Lungs: unlabored ?Abdomen: Soft, nontender, nondistended, 4/5 strength, lack of control/coordination  ?Neurological: weakness to bilateral LE ?Extremities: No clubbing or cyanosis. No edema.  ? ? ?Data Reviewed: I have personally reviewed following labs and imaging studies ? ?CBC: ?Recent Labs  ?Lab 04/18/21 ?2336 04/20/21 ?4132 04/21/21 ?0518 04/22/21 ?4401 04/23/21 ?0272 04/24/21 ?5366  ?WBC 9.7 7.6 11.1* 14.6* 16.6* 15.0*  ?NEUTROABS 6.3  --   --  12.0* 13.0* 12.0*  ?HGB 9.9* 10.6* 9.2* 9.8* 10.4* 11.3*  ?HCT 29.9* 32.5* 26.9* 29.6* 32.9* 34.1*  ?MCV 84.9 86.2 86.2 85.3 89.2 85.5  ?PLT 376 386 334 417* 451* 527*  ? ? ?Basic Metabolic Panel: ?Recent Labs  ?Lab 04/20/21 ?4403 04/21/21 ?0518 04/22/21 ?4742 04/23/21 ?5956 04/24/21 ?3875  ?NA 133* 133* 133* 132* 132*  ?K 4.2 4.2 4.0 3.9 4.2  ?CL 97* 98 97* 96* 94*  ?CO2 _0 ?GLUCOSE 150* 149* 128* 141* 138*  ?BUN _1 ?CREATININE 0.64 0.66 0.54 0.68 0.69  ?CALCIUM 9.0 8.4* 9.0 8.8* 8.9  ?MG  --  1.8 2.0 2.0 2.1  ?PHOS  --   --  3.8 4.0 4.3  ? ? ?GFR: ?Estimated Creatinine Clearance: 41.6 mL/min (by C-G formula based on SCr of 0.69 mg/dL). ? ?Liver Function Tests: ?Recent Labs  ?Lab 04/22/21 ?6433 04/23/21 ?2951 04/24/21 ?8841  ?AST _2 ?ALT _3 ?ALKPHOS 191* 207* 203*  ?BILITOT 0.5 0.3 0.4  ?PROT 6.3* 6.4* 6.6  ?ALBUMIN 3.1* 3.1* 3.2*  ? ? ?CBG: ?Recent Labs  ?Lab 04/23/21 ?1203 04/23/21 ?1656 04/23/21 ?2200 04/24/21 ?6606 04/24/21 ?1215  ?GLUCAP 139* 133* 257* 146* 118*  ? ? ? ?No results found for this or any previous visit (from the past 240  hour(s)).  ? ? ? ? ? ?Radiology Studies: ?DG CHEST PORT 1 VIEW ? ?Result Date: 04/23/2021 ?CLINICAL DATA:  Weakness, status post bronchoscopy 2 days ago. Shortness of breath EXAM: PORTABLE CHEST 1 VIEW COMPARISON:  Chest radiograph 04/20/2021, CT chest 03/26/2021 FINDINGS: The cardiomediastinal silhouette is stable. The large right lower lobe mass measuring approximately 4.5 cm is unchanged. The lungs are otherwise clear. There is no new or worsening focal airspace disease. There is no pleural effusion. There is no pneumothorax There is advanced degenerative change of the right shoulder. There is no acute osseous abnormality. IMPRESSION: Unchanged right lower lobe mass. No new or worsening focal airspace disease. No pneumothorax. Electronically Signed   By: Valetta Mole M.D.   On: 04/23/2021 11:40  ? ?VAS [place] LOWER EXTREMITY VENOUS (DVT) ? ?Result Date: 04/23/2021 ? Lower Venous DVT Study Patient Name:  Anna Finley  Date of Exam:   04/23/2021 Medical Rec #: [id number]       Accession #:    [id number] Date of Birth: 06-Jul-1945        Patient Gender: F Patient  Age:   86 years Exam Location:  [facility] Procedure:      VAS [place] LOWER EXTREMITY VENOUS (DVT) Referring Phys: Samyukta Cura POWELL JR --------------------------------------------------------------------------------  Indications: Cancer, at high risk for venous thromboembolism.  Risk Factors: Cancer. Limitations: Poor ultrasound/tissue interface and body habitus. Comparison Study: No prior studies. Performing Technologist: Oliver Hum RVT  Examination Guidelines: Alany Borman complete evaluation includes B-mode imaging, spectral Doppler, color Doppler, and power Doppler as needed of all accessible portions of each vessel. Bilateral testing is considered an integral part of Chiquetta Langner complete examination. Limited examinations for reoccurring indications may be performed as noted. The reflux portion of the exam is performed with the patient in reverse Trendelenburg.   +---------+---------------+---------+-----------+----------+--------------+ RIGHT    CompressibilityPhasicitySpontaneityPropertiesThrombus Aging +---------+---------------+---------+-----------+----------+--------------+ CFV      Full

## 2021-04-24 NOTE — Progress Notes (Signed)
ANTICOAGULATION CONSULT NOTE - Follow Up Consult ? ?Pharmacy Consult for Heparin ?Indication:  R IJ thrombus ? ?No Known Allergies ? ?Patient Measurements: ?Height: 5\' 5"  (165.1 cm) ?Weight: 43.4 kg (95 lb 10.9 oz) ?IBW/kg (Calculated) : 57 ?Heparin Dosing Weight:  43.4kg ? ?Vital Signs: ?Temp: 98.5 ?F (36.9 ?C) (04/08 2033) ?Temp Source: Oral (04/08 2033) ?BP: 102/54 (04/08 2033) ?Pulse Rate: 63 (04/08 2033) ? ?Labs: ?Recent Labs  ?  04/22/21 ?2035 04/22/21 ?1603 04/23/21 ?5974 04/23/21 ?1113 04/23/21 ?1253 04/24/21 ?1638 04/24/21 ?2207  ?HGB 9.8*  --  10.4*  --   --  11.3*  --   ?HCT 29.6*  --  32.9*  --   --  34.1*  --   ?PLT 417*  --  451*  --   --  527*  --   ?HEPARINUNFRC  --  <0.10*  --   --   --   --  <0.10*  ?CREATININE 0.54  --  0.68  --   --  0.69  --   ?TROPONINIHS  --   --   --  33* 34*  --   --   ? ? ? ?Estimated Creatinine Clearance: 41.6 mL/min (by C-G formula based on SCr of 0.69 mg/dL). ? ? ?Assessment: ? ?AC/Heme: right IJ thrombus-->  IV heparin,  ?- 4/5 CT neck: Nonocclusive short segment thrombus within the right internal jugular vein just inferior to the skull base ?- no bolus started on 4/6 AM, then d/ced in PM d/t hemoptysis ?- 4/8: - 4/8: Hgb 11.3 improved. RN reports not hemoptysis. MD ok to resume heparin with no bolus. ? ?2207 HL < 0.1 subtherapeutic on 700 units/hr ?Per RN, pt lost IV access and heparin was off for > 1 hour heparin only restarted right before level obtained ? ? ?Goal of Therapy:  ?Heparin level 0.3-0.5 units/ml lower goal. ?Monitor platelets by anticoagulation protocol: Yes ?  ?Plan:  ?Continue heparin drip at 700 units/hr ?Check heparin level in 8 hrs ?Daily HL and CBC ?F/u to start [facility] ? ? ?Dolly Rias RPh ?04/24/2021, 10:49 PM ? ? ?

## 2021-04-25 DIAGNOSIS — G9520 Unspecified cord compression: Secondary | ICD-10-CM | POA: Diagnosis not present

## 2021-04-25 LAB — COMPREHENSIVE METABOLIC PANEL
ALT: 21 U/L (ref 0–44)
AST: 17 U/L (ref 15–41)
Albumin: 3.3 g/dL — ABNORMAL LOW (ref 3.5–5.0)
Alkaline Phosphatase: 194 U/L — ABNORMAL HIGH (ref 38–126)
Anion gap: 11 (ref 5–15)
BUN: 25 mg/dL — ABNORMAL HIGH (ref 8–23)
CO2: 27 mmol/L (ref 22–32)
Calcium: 8.8 mg/dL — ABNORMAL LOW (ref 8.9–10.3)
Chloride: 93 mmol/L — ABNORMAL LOW (ref 98–111)
Creatinine, Ser: 0.66 mg/dL (ref 0.44–1.00)
GFR, Estimated: >60 mL/min (ref >60)
Glucose, Bld: 186 mg/dL — ABNORMAL HIGH (ref 70–99)
Potassium: 3.7 mmol/L (ref 3.5–5.1)
Sodium: 131 mmol/L — ABNORMAL LOW (ref 135–145)
Total Bilirubin: 0.3 mg/dL (ref 0.3–1.2)
Total Protein: 6.6 g/dL (ref 6.5–8.1)

## 2021-04-25 LAB — GLUCOSE, CAPILLARY
Glucose-Capillary: 139 mg/dL — ABNORMAL HIGH (ref 70–99)
Glucose-Capillary: 154 mg/dL — ABNORMAL HIGH (ref 70–99)
Glucose-Capillary: 158 mg/dL — ABNORMAL HIGH (ref 70–99)
Glucose-Capillary: 194 mg/dL — ABNORMAL HIGH (ref 70–99)

## 2021-04-25 LAB — MAGNESIUM: Magnesium: 2.2 mg/dL (ref 1.7–2.4)

## 2021-04-25 LAB — PHOSPHORUS: Phosphorus: 4.4 mg/dL (ref 2.5–4.6)

## 2021-04-25 LAB — CBC
HCT: 34.2 % — ABNORMAL LOW (ref 36.0–46.0)
Hemoglobin: 11.5 g/dL — ABNORMAL LOW (ref 12.0–15.0)
MCH: 29 pg (ref 26.0–34.0)
MCHC: 33.6 g/dL (ref 30.0–36.0)
MCV: 86.1 fL (ref 80.0–100.0)
Platelets: 576 10*3/uL — ABNORMAL HIGH (ref 150–400)
RBC: 3.97 MIL/uL (ref 3.87–5.11)
RDW: 17.1 % — ABNORMAL HIGH (ref 11.5–15.5)
WBC: 17.3 10*3/uL — ABNORMAL HIGH (ref 4.0–10.5)
nRBC: 0 % (ref 0.0–0.2)

## 2021-04-25 LAB — HEPARIN LEVEL (UNFRACTIONATED)
Heparin Unfractionated: 0.2 IU/mL — ABNORMAL LOW (ref 0.30–0.70)
Heparin Unfractionated: 0.19 IU/mL — ABNORMAL LOW (ref 0.30–0.70)

## 2021-04-25 MED ORDER — DICLOFENAC SODIUM 1 % EX GEL
2.0000 g | Freq: Four times a day (QID) | CUTANEOUS | Status: DC | PRN
  Administered 2021-04-25: 2 g via TOPICAL
  Filled 2021-04-25: qty 100

## 2021-04-25 MED ORDER — HEPARIN (PORCINE) 25000 UT/250ML-% IV SOLN
850.0000 [IU]/h | INTRAVENOUS | Status: DC
  Administered 2021-04-25: 750 [IU]/h via INTRAVENOUS
  Filled 2021-04-25 (×2): qty 250

## 2021-04-25 NOTE — Progress Notes (Signed)
ANTICOAGULATION CONSULT NOTE - Follow Up Consult ? ?Pharmacy Consult for Heparin ?Indication:  R IJ thrombus ? ?No Known Allergies ? ?Patient Measurements: ?Height: 5\' 5"  (165.1 cm) ?Weight: 43.4 kg (95 lb 10.9 oz) ?IBW/kg (Calculated) : 57 ?Heparin Dosing Weight:  43.4kg ? ?Vital Signs: ?Temp: 98.4 ?F (36.9 ?C) (04/09 0537) ?Temp Source: Oral (04/09 0537) ?BP: 124/73 (04/09 1012) ?Pulse Rate: 63 (04/09 1012) ? ?Labs: ?Recent Labs  ?  04/22/21 ?1603 04/23/21 ?2536 04/23/21 ?6440 04/23/21 ?1113 04/23/21 ?1253 04/24/21 ?3474 04/24/21 ?2207 04/25/21 ?0515  ?HGB  --  10.4*   < >  --   --  11.3*  --  11.5*  ?HCT  --  32.9*  --   --   --  34.1*  --  34.2*  ?PLT  --  451*  --   --   --  527*  --  576*  ?HEPARINUNFRC <0.10*  --   --   --   --   --  <0.10* 0.20*  ?CREATININE  --  0.68  --   --   --  0.69  --  0.66  ?TROPONINIHS  --   --   --  33* 34*  --   --   --   ? < > = values in this interval not displayed.  ? ? ? ?Estimated Creatinine Clearance: 41.6 mL/min (by C-G formula based on SCr of 0.66 mg/dL). ? ? ?Assessment: ? ?AC/Heme: right IJ thrombus-->  IV heparin,  ?- 4/5 CT neck: Nonocclusive short segment thrombus within the right internal jugular vein just inferior to the skull base ?- no bolus started on 4/6 AM, then d/ced in PM d/t hemoptysis ?- 4/8: - 4/8: Hgb 11.3 improved. RN reports not hemoptysis. MD ok to resume heparin with no bolus. ? ?4/9 0515 heparin level subtherapeutic at 0.2 ?No bleeding and no line issue/interruption per nurse ? ? ?Goal of Therapy:  ?Heparin level 0.3-0.5 units/ml lower goal. ?Monitor platelets by anticoagulation protocol: Yes ?  ?Plan:  ?Increase heparin infusion to 750 units/hr ?Check heparin level in 8 hrs ?Daily HL and CBC ?F/u to start [facility] ? ? ?Royetta Asal, PharmD, BCPS ?Clinical Pharmacist ?[place] ?Please utilize Amion for appropriate phone number to reach the unit pharmacist ([place]) ?04/25/2021 11:49 AM ? ? ? ?

## 2021-04-25 NOTE — Progress Notes (Signed)
ANTICOAGULATION CONSULT NOTE - Follow Up Consult ? ?Pharmacy Consult for Heparin ?Indication:  R IJ thrombus ? ?No Known Allergies ? ?Patient Measurements: ?Height: 5\' 5"  (165.1 cm) ?Weight: 43.4 kg (95 lb 10.9 oz) ?IBW/kg (Calculated) : 57 ?Heparin Dosing Weight:  43.4kg ? ?Vital Signs: ?Temp: 97.9 ?F (36.6 ?C) (04/09 1558) ?Temp Source: Oral (04/09 1558) ?BP: 131/67 (04/09 1558) ?Pulse Rate: 52 (04/09 1558) ? ?Labs: ?Recent Labs  ?  04/23/21 ?9233 04/23/21 ?1113 04/23/21 ?1253 04/24/21 ?0076 04/24/21 ?2207 04/25/21 ?2263 04/25/21 ?1953  ?HGB 10.4*  --   --  11.3*  --  11.5*  --   ?HCT 32.9*  --   --  34.1*  --  34.2*  --   ?PLT 451*  --   --  527*  --  576*  --   ?HEPARINUNFRC  --   --   --   --  <0.10* 0.20* 0.19*  ?CREATININE 0.68  --   --  0.69  --  0.66  --   ?TROPONINIHS  --  33* 34*  --   --   --   --   ? ? ? ?Estimated Creatinine Clearance: 41.6 mL/min (by C-G formula based on SCr of 0.66 mg/dL). ? ? ?Assessment: ? ?AC/Heme: right IJ thrombus-->  IV heparin,  ?- 4/5 CT neck: Nonocclusive short segment thrombus within the right internal jugular vein just inferior to the skull base ?- 4/9: HL 0.2 and 0.19 both low. CBC stable ? ?Goal of Therapy:  ?Heparin level 0.3-0.5 units/ml lower goal. ?Monitor platelets by anticoagulation protocol: Yes ?  ?Plan:  ?Increase heparin to 850 units/hr ?Daily HL and CBC ?F/u to start [facility] ? ? ?Paisely Brick S. Alford Highland, PharmD, BCPS ?Clinical Staff Pharmacist ?[email or web address] ? ?Alford Highland, [employer] ?04/25/2021,9:01 PM ? ? ?

## 2021-04-25 NOTE — Progress Notes (Signed)
After getting the patient up to the bedside commode several times throughout the shift, RN and NT feel it is extremely unsafe for the patient to be getting out of bed. With each time of getting the patient up, she almost fell into the floor, even when assisted by 2 people. The patient cannot bear weight on her lower extremities, and does not have voluntary control of them.   ? ?This patient is a MINIMUM of a 2 assist, for both patient and staff safety.  ? ? ?Bedpan use would be much safer.  ?

## 2021-04-25 NOTE — Progress Notes (Signed)
?PROGRESS NOTE ? ? ? ?Anna Finley  ZHG:[id number] DOB: 10/01/1945 DOA: 04/18/2021 ?PCP: [name], MD  ?Chief Complaint  ?Patient presents with  ? Gait Problem  ? ? ?Brief Narrative:  ?76 yo with hx HTN, COPD, and suspected lung cancer based on imaging who was planned for outpatient PET/biopsy presented in interim with difficulty ambulated and neck/shoulder pain.  Due to issues with ambulation, weight loss, hemoptysis she presented to the ED and was found to have metastatic deposit in T3 vertebrae with cord compression. ? ?See below for additional details  ? ? ?Assessment & Plan: ?  ?Principal Problem: ?  Spinal cord compression ([place]) ?Active Problems: ?  Malignant neoplasm of lower lobe of right lung ([place]) ?  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  Hemoptysis ?  COPD (chronic obstructive pulmonary disease) with chronic bronchitis ([place]) ?  Cancer related pain ?  HTN (hypertension) ?  Protein-calorie malnutrition, severe ?  Goals of care, counseling/discussion ?  Hypokalemia ? ? ?Assessment and Plan: ? ?* Spinal cord compression (HCC) ?Patient presenting with B leg weakness, dfficulty ambulating ?MRI C spine with pathologic compression fx of T3 with bulky enhancing tumor in the upper thoracic spinal cord canal and spinal cord compression, possible mild T3 level cord edema.  Tumor obliterating bilateral T3 and L T2 neural foramina. ?MRI T/L spine with metastatic deposit in T3 verebra with epidural tumor compressing the cord and infiltrating the L H9-6 foramen, uncomplicated metastatic disease at T9, T12, L4, and sacrum.  Degenerative spinal stenosis at L4-5, degenerative bilateral foraminal impingement at L4-5 and L5-S1. ?MRI brain with no intracranial metastatic disease or acute intracranial abnormality ?Neurosurgery recommending proceed with radiation if radiosensitive tumor, could consider cytoreductive surgery if not radiosensitive ?Rad onc planning for 10 fractions of  radiotherapy to thoracic spine (started 4/5 - refused 4/6) ?Dexamethasone ?Oncology planning to follow up outpatient after PET scan ?Planning for CIR for ambulatory dysfunction ? 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 ? ?Hemoptysis ?Continued overnight ?She's receiving radiation to lung mass as well (discussed with oncology/rad onc today) ?No hemoptysis for 48hrs ? ?Cancer related pain ?Morphine, oxy ? ?COPD (chronic obstructive pulmonary disease) with chronic bronchitis ([place]) ?Albuterol, dulera, singulair  ? ?HTN (hypertension) ?Atenolol, chlorthalidone ? ?Goals of care, counseling/discussion ?Agreeable to inpatient rehab today ?Will continue to discuss care plan and next steps based on what's important to her ? ? ?DVT prophylaxis: SCD ?Code Status: full ?Family Communication: daughter at bedside ?Disposition:  ? ?Status is: Inpatient ?Remains inpatient appropriate because: need for radiation therapy, oncology c/s, therapy ?  ?Consultants:  ?PCCM ?Rad onc ?neurosurgery ? ?Procedures:  ?Flexible bronchoscopy and EBUS Bronchoscopy ? ?Antimicrobials:  ?Anti-infectives (From admission, onward)  ? ? None  ? ?  ? ? ?Subjective: ?No new compliants ? ?Objective: ?Vitals:  ? 04/24/21 2033 04/25/21 0345 04/25/21 0537 04/25/21 1012  ?BP: (!) 102/54  (!) 119/58 124/73  ?Pulse: 63  67 63  ?  Resp: 17  16   ?Temp:  98.5 ?F (36.9 ?C)  98.4 ?F (36.9 ?C)   ?TempSrc: Oral  Oral   ?SpO2: 92% 97% 97%   ?Weight:      ?Height:      ? ? ?Intake/Output Summary (Last 24 hours) at 04/25/2021 1250 ?Last data filed at 04/25/2021 0936 ?Gross per 24 hour  ?Intake 321.4 ml  ?Output --  ?Net 321.4 ml  ? ?Filed Weights  ? 04/18/21 2258 04/19/21 1421 04/20/21 1232  ?Weight: 43.1 kg 43.4 kg 43.4 kg  ? ? ?Examination: ? ?General: No acute distress. ?Cardiovascular: RRR ?Lungs: unlabored ?Abdomen: Soft, nontender, nondistended  ?Neurological: weakness/ataxia to bilateral LE's, L>R ?Skin: Warm and dry. No rashes or lesions. ?Extremities: No clubbing or cyanosis. No edema. ?Data Reviewed: I have personally reviewed following labs and imaging studies ? ?CBC: ?Recent Labs  ?Lab 04/18/21 ?2336 04/20/21 ?3532 04/21/21 ?0518 04/22/21 ?9924 04/23/21 ?2683 04/24/21 ?4196 04/25/21 ?0515  ?WBC 9.7   < > 11.1* 14.6* 16.6* 15.0* 17.3*  ?NEUTROABS 6.3  --   --  12.0* 13.0* 12.0*  --   ?HGB 9.9*   < > 9.2* 9.8* 10.4* 11.3* 11.5*  ?HCT 29.9*   < > 26.9* 29.6* 32.9* 34.1* 34.2*  ?MCV 84.9   < > 86.2 85.3 89.2 85.5 86.1  ?PLT 376   < > 334 417* 451* 527* 576*  ? < > = values in this interval not displayed.  ? ? ?Basic Metabolic Panel: ?Recent Labs  ?Lab 04/21/21 ?0518 04/22/21 ?2229 04/23/21 ?7989 04/24/21 ?2119 04/25/21 ?0515  ?NA 133* 133* 132* 132* 131*  ?K 4.2 4.0 3.9 4.2 3.7  ?CL 98 97* 96* 94* 93*  ?CO2 _0 ?GLUCOSE 149* 128* 141* 138* 186*  ?BUN _1 25*  ?CREATININE 0.66 0.54 0.68 0.69 0.66  ?CALCIUM 8.4* 9.0 8.8* 8.9 8.8*  ?MG 1.8 2.0 2.0 2.1 2.2  ?PHOS  --  3.8 4.0 4.3 4.4  ? ? ?GFR: ?Estimated Creatinine Clearance: 41.6 mL/min (by C-G formula based on SCr of 0.66 mg/dL). ? ?Liver Function Tests: ?Recent Labs  ?Lab 04/22/21 ?4174 04/23/21 ?0814 04/24/21 ?4818 04/25/21 ?0515  ?AST _2 ?ALT _3 ?ALKPHOS 191* 207* 203* 194*  ?BILITOT 0.5 0.3 0.4 0.3  ?PROT 6.3* 6.4* 6.6 6.6  ?ALBUMIN 3.1* 3.1* 3.2* 3.3*  ? ? ?CBG: ?Recent Labs   ?Lab 04/24/21 ?1215 04/24/21 ?1807 04/24/21 ?2036 04/25/21 ?0749 04/25/21 ?1225  ?GLUCAP 118* 157* 162* 139* 154*  ? ? ? ?No results found for this or any previous visit (from the past 240 hour(s)).  ? ? ? ? ? ?Radiology Studies: ?VAS [place] LOWER EXTREMITY VENOUS (DVT) ? ?Result Date: 04/23/2021 ? Lower Venous DVT Study Patient Name:  JANANN BOEVE  Date of Exam:   04/23/2021 Medical Rec #: [id number]       Accession #:    [id number] Date of Birth: 1945-02-17        Patient Gender: F Patient Age:   23 years Exam Location:  [facility] Procedure:      VAS [place] LOWER EXTREMITY VENOUS (DVT) Referring Phys: Libi Corso POWELL JR --------------------------------------------------------------------------------  Indications: Cancer, at high risk for venous thromboembolism.  Risk Factors: Cancer. Limitations: Poor ultrasound/tissue interface and body habitus. Comparison Study: No prior studies. Performing Technologist: Oliver Hum RVT  Examination Guidelines: Charman Blasco complete evaluation includes B-mode imaging, spectral Doppler, color Doppler, and power Doppler as needed of  all accessible portions of each vessel. Bilateral testing is considered an integral part of Iyauna Sing complete examination. Limited examinations for reoccurring indications may be performed as noted. The reflux portion of the exam is performed with the patient in reverse Trendelenburg.  +---------+---------------+---------+-----------+----------+--------------+ RIGHT    CompressibilityPhasicitySpontaneityPropertiesThrombus Aging +---------+---------------+---------+-----------+----------+--------------+ CFV      Full           Yes      Yes                                 +---------+---------------+---------+-----------+----------+--------------+ SFJ      Full                                                        +---------+---------------+---------+-----------+----------+--------------+ FV Prox  Full                                                         +---------+---------------+---------+-----------+----------+--------------+ FV Mid   Full                                                        +---------+---------------+---------+-----------+----------+--------------+ FV Dista

## 2021-04-26 ENCOUNTER — Ambulatory Visit
Admit: 2021-04-26 | Discharge: 2021-04-26 | Disposition: A | Discharge status: Home / Self Care | Payer: Medicare Other | Attending: Radiation Oncology | Admitting: Radiation Oncology

## 2021-04-26 ENCOUNTER — Encounter: Payer: Self-pay | Admitting: *Deleted

## 2021-04-26 DIAGNOSIS — R519 Headache, unspecified: Secondary | ICD-10-CM

## 2021-04-26 LAB — CBC WITH DIFFERENTIAL/PLATELET
Abs Immature Granulocytes: 0.13 10*3/uL — ABNORMAL HIGH (ref 0.00–0.07)
Basophils Absolute: 0 10*3/uL (ref 0.0–0.1)
Basophils Relative: 0 %
Eosinophils Absolute: 0 10*3/uL (ref 0.0–0.5)
Eosinophils Relative: 0 %
HCT: 33.5 % — ABNORMAL LOW (ref 36.0–46.0)
Hemoglobin: 11 g/dL — ABNORMAL LOW (ref 12.0–15.0)
Immature Granulocytes: 1 %
Lymphocytes Relative: 10 %
Lymphs Abs: 1.4 10*3/uL (ref 0.7–4.0)
MCH: 28.4 pg (ref 26.0–34.0)
MCHC: 32.8 g/dL (ref 30.0–36.0)
MCV: 86.6 fL (ref 80.0–100.0)
Monocytes Absolute: 0.8 10*3/uL (ref 0.1–1.0)
Monocytes Relative: 6 %
Neutro Abs: 11.7 10*3/uL — ABNORMAL HIGH (ref 1.7–7.7)
Neutrophils Relative %: 83 %
Platelets: 605 10*3/uL — ABNORMAL HIGH (ref 150–400)
RBC: 3.87 MIL/uL (ref 3.87–5.11)
RDW: 17.2 % — ABNORMAL HIGH (ref 11.5–15.5)
WBC: 14.1 10*3/uL — ABNORMAL HIGH (ref 4.0–10.5)
nRBC: 0 % (ref 0.0–0.2)

## 2021-04-26 LAB — COMPREHENSIVE METABOLIC PANEL
ALT: 21 U/L (ref 0–44)
AST: 17 U/L (ref 15–41)
Albumin: 3.2 g/dL — ABNORMAL LOW (ref 3.5–5.0)
Alkaline Phosphatase: 188 U/L — ABNORMAL HIGH (ref 38–126)
Anion gap: 10 (ref 5–15)
BUN: 22 mg/dL (ref 8–23)
CO2: 26 mmol/L (ref 22–32)
Calcium: 9.1 mg/dL (ref 8.9–10.3)
Chloride: 94 mmol/L — ABNORMAL LOW (ref 98–111)
Creatinine, Ser: 0.62 mg/dL (ref 0.44–1.00)
GFR, Estimated: >60 mL/min (ref >60)
Glucose, Bld: 149 mg/dL — ABNORMAL HIGH (ref 70–99)
Potassium: 4.6 mmol/L (ref 3.5–5.1)
Sodium: 130 mmol/L — ABNORMAL LOW (ref 135–145)
Total Bilirubin: 0.3 mg/dL (ref 0.3–1.2)
Total Protein: 6.5 g/dL (ref 6.5–8.1)

## 2021-04-26 LAB — HEPARIN LEVEL (UNFRACTIONATED): Heparin Unfractionated: 0.43 IU/mL (ref 0.30–0.70)

## 2021-04-26 LAB — GLUCOSE, CAPILLARY
Glucose-Capillary: 124 mg/dL — ABNORMAL HIGH (ref 70–99)
Glucose-Capillary: 160 mg/dL — ABNORMAL HIGH (ref 70–99)
Glucose-Capillary: 197 mg/dL — ABNORMAL HIGH (ref 70–99)
Glucose-Capillary: 139 mg/dL — ABNORMAL HIGH (ref 70–99)

## 2021-04-26 LAB — PHOSPHORUS: Phosphorus: 5 mg/dL — ABNORMAL HIGH (ref 2.5–4.6)

## 2021-04-26 LAB — MAGNESIUM: Magnesium: 2.1 mg/dL (ref 1.7–2.4)

## 2021-04-26 MED ORDER — BUTALBITAL-APAP-CAFFEINE 50-325-40 MG PO TABS
2.0000 | ORAL_TABLET | Freq: Once | ORAL | Status: AC
Start: 2021-04-26 — End: 2021-04-26
  Administered 2021-04-26: 2 via ORAL
  Filled 2021-04-26: qty 2

## 2021-04-26 MED ORDER — BUTALBITAL-APAP-CAFFEINE 50-325-40 MG PO TABS
1.0000 | ORAL_TABLET | Freq: Once | ORAL | Status: AC
  Administered 2021-04-26: 1 via ORAL
  Filled 2021-04-26: qty 1

## 2021-04-26 NOTE — Progress Notes (Signed)
Occupational Therapy Treatment ?Patient Details ?Name: Anna Finley ?MRN: [id number] ?DOB: 01/16/1946 ?Today's Date: 04/26/2021 ? ? ?History of present illness Patient is 76 y.o. female with recent diagnosis of recent diagnosis of Rt lung cancer. Pt developed significant weight loss hemoptysis with worsening cough and due to these new findings of weakness ambulation patient has been considered for admission to hospital for further evaluation. MRI revealed  T3 compression fracture and bulky tumor in upper thoracic spinal canal causing cord compression.  follow up MRI revealed additional T12, T9 lesions. PMH significant for HTN, COPD, THA. ?  ?OT comments ? Patient was noted to be in good spirits this afternoon with patient having relatives visiting. Patient engaged in therapeutic activity EOB to increase functional activity tolerance and self righting strategies to improve sitting balance to engage in ADL tasks. Patient's discharge plan remains appropriate at this time. OT will continue to follow acutely.  ?  ? ?Recommendations for follow up therapy are one component of a multi-disciplinary discharge planning process, led by the attending physician.  Recommendations may be updated based on patient status, additional functional criteria and insurance authorization. ?   ?Follow Up Recommendations ? Home health OT  ?  ?Assistance Recommended at Discharge Frequent or constant Supervision/Assistance  ?Patient can return home with the following ? A lot of help with walking and/or transfers;A lot of help with bathing/dressing/bathroom;Assistance with cooking/housework;Help with stairs or ramp for entrance;Assist for transportation ?  ?Equipment Recommendations ? BSC/3in1;Tub/shower bench  ?  ?Recommendations for Other Services   ? ?  ?Precautions / Restrictions Precautions ?Precautions: Fall ?Restrictions ?Weight Bearing Restrictions: No  ? ? ?  ? ?Mobility Bed Mobility ?Overal bed mobility: Needs Assistance ?Bed Mobility:  Supine to Sit ?  ?  ?Supine to sit: Min assist ?  ?  ?  ?  ? ?Transfers ?  ?  ?  ?  ?  ?  ?  ?  ?  ?  ?  ?  ?Balance Overall balance assessment: History of Falls, Needs assistance ?Sitting-balance support: Feet supported ?Sitting balance-Leahy Scale: Fair ?Sitting balance - Comments: with BUE support, with no UE support sitting balance is poor ?  ?  ?  ?  ?  ?  ?  ?  ?  ?  ?  ?  ?  ?  ?  ?   ? ?ADL either performed or assessed with clinical judgement  ? ?ADL   ?  ?  ?  ?  ?  ?  ?  ?  ?  ?  ?  ?  ?  ?  ?  ?  ?  ?  ?  ?  ?  ? ?Extremity/Trunk Assessment   ?  ?  ?  ?  ?  ? ?Vision   ?  ?  ?Perception   ?  ?Praxis   ?  ? ?Cognition Arousal/Alertness: Awake/alert ?Behavior During Therapy: [facility] for tasks assessed/performed ?Overall Cognitive Status: Within Functional Limits for tasks assessed ?  ?  ?  ?  ?  ?  ?  ?  ?  ?  ?  ?  ?  ?  ?  ?  ?  ?  ?  ?   ?Exercises Other Exercises ?Other Exercises: patient working on trunk control and self righting strategies sitting on edge of bed. patient was able to teach back at times education but noted to make large corrections to balance. patient was min guard during tasks with min A at  times with LOB posteriorly and to R side. patient was also noted to be quick to fatigue during exercise. ? ?  ?Shoulder Instructions   ? ? ?  ?General Comments    ? ? ?Pertinent Vitals/ Pain       Pain Assessment ?Pain Assessment: No/denies pain ?Pain Intervention(s): Monitored during session ? ?Home Living   ?  ?  ?  ?  ?  ?  ?  ?  ?  ?  ?  ?  ?  ?  ?  ?  ?  ?  ? ?  ?Prior Functioning/Environment    ?  ?  ?  ?   ? ?Frequency ? Min 2X/week  ? ? ? ? ?  ?Progress Toward Goals ? ?OT Goals(current goals can now be found in the care plan section) ? Progress towards OT goals: Progressing toward goals ? ?   ?Plan Discharge plan remains appropriate   ? ?Co-evaluation ? ? ?   ?  ?  ?  ?  ? ?  ?AM-PAC OT "6 Clicks" Daily Activity     ?Outcome Measure ? ? Help from another person eating meals?: None ?Help from  another person taking care of personal grooming?: A Little ?Help from another person toileting, which includes using toliet, bedpan, or urinal?: A Lot ?Help from another person bathing (including washing, rinsing, drying)?: A Little (bed level) ?Help from another person to put on and taking off regular upper body clothing?: A Little ?Help from another person to put on and taking off regular lower body clothing?: A Little (bed level) ?6 Click Score: 18 ? ?  ?End of Session   ? ?OT Visit Diagnosis: Other abnormalities of gait and mobility (R26.89) ?  ?Activity Tolerance Patient tolerated treatment well ?  ?Patient Left in bed;with call bell/phone within reach;with bed alarm set ?  ?Nurse Communication Mobility status ?  ? ?   ? ?Time: 2130-8657 ?OT Time Calculation (min): 15 min ? ?Charges: OT General Charges ?$OT Visit: 1 Visit ?OT Treatments ?$Therapeutic Activity: 8-22 mins ? ?Makih Stefanko OTR/L, MS ?Acute Rehabilitation Department ?Office# [phone] ?Pager# [phone] ? ? ?[place] ?04/26/2021, 3:35 PM ?

## 2021-04-26 NOTE — Progress Notes (Signed)
Inpatient Rehab Admissions Coordinator:  ? ?Reconsult received.  Per therapy notes, pt and family continue to desire d/c home.  Per chart review, pt with rapid decline in mobility over the last several days, now requiring extensive +2 assist.  If pt/family did to pursue CIR she would likely remain +2 assist, given spinal cord involvement.  Would not expect significant functional improvement even with CIR stay.  We will sign off at this time.   ? ?Shann Medal, PT, DPT ?Admissions Coordinator ?[phone] ?04/26/21  ?10:55 AM ? ?

## 2021-04-26 NOTE — Assessment & Plan Note (Addendum)
resolved 

## 2021-04-26 NOTE — Care Management Important Message (Signed)
Important Message ? ?Patient Details IM Letter placed in Patients room. ?Name: Anna Finley ?MRN: [id number] ?Date of Birth: [birth year] ? ? ?Medicare Important Message Given:  Yes ? ? ? ? ?Kerin Salen ?04/26/2021, 10:16 AM ?

## 2021-04-26 NOTE — Progress Notes (Addendum)
ANTICOAGULATION CONSULT NOTE - Follow Up Consult ? ?Pharmacy Consult for Heparin ?Indication:  R IJ thrombus ? ?No Known Allergies ? ?Patient Measurements: ?Height: 5\' 5"  (165.1 cm) ?Weight: 43.4 kg (95 lb 10.9 oz) ?IBW/kg (Calculated) : 57 ?Heparin Dosing Weight:  43.4kg ? ?Vital Signs: ?Temp: 98.1 ?F (36.7 ?C) (04/10 0414) ?Temp Source: Oral (04/10 0414) ?BP: 123/73 (04/10 0414) ?Pulse Rate: 56 (04/10 0414) ? ?Labs: ?Recent Labs  ?   ?0000 04/23/21 ?1113 04/23/21 ?1253 04/24/21 ?0641 04/24/21 ?2207 04/25/21 ?2706 04/25/21 ?1953 04/26/21 ?2376  ?HGB   < >  --   --  11.3*  --  11.5*  --  11.0*  ?HCT  --   --   --  34.1*  --  34.2*  --  33.5*  ?PLT  --   --   --  527*  --  576*  --  605*  ?HEPARINUNFRC  --   --   --   --    < > 0.20* 0.19* 0.43  ?CREATININE  --   --   --  0.69  --  0.66  --  0.62  ?TROPONINIHS  --  33* 34*  --   --   --   --   --   ? < > = values in this interval not displayed.  ? ? ?Estimated Creatinine Clearance: 41.6 mL/min (by C-G formula based on SCr of 0.62 mg/dL). ? ?Assessment: ?AC/Heme: right IJ thrombus-->  IV heparin  ?- 4/5 CT neck: Nonocclusive short segment thrombus within the right internal jugular vein just inferior to the skull base ?Significant Events: heparin drip discontinued on 4/6 PM due to hemoptysis --> resolved as of 4/8 and heparin drip resumed ? ?Today, 04/26/21 ?- Heparin level therapeutic at 0.43 on heparin 850 units/hr ?- CBC remains stable ?- No reports of hemoptysis per RN ? ?Goal of Therapy:  ?Heparin level 0.3-0.5 units/ml lower goal. ?Monitor platelets by anticoagulation protocol: Yes ?  ?Plan:  ?Continue heparin drip at 850 units/hr ?Daily HL and CBC ?Monitor for further hemoptysis ?F/u to start [facility] ? ?Dimple Nanas, PharmD ?04/26/2021 7:19 AM ? ?

## 2021-04-26 NOTE — Progress Notes (Signed)
?PROGRESS NOTE ? ? ? ?Anna Finley  UKG:[id number] DOB: [birth year] DOA: 04/18/2021 ?PCP: [name], MD  ?Chief Complaint  ?Patient presents with  ? Gait Problem  ? ? ?Brief Narrative:  ?76 yo with hx HTN, COPD, and suspected lung cancer based on imaging who was planned for outpatient PET/biopsy presented in interim with difficulty ambulated and neck/shoulder pain.  Due to issues with ambulation, weight loss, hemoptysis she presented to the ED and was found to have metastatic deposit in T3 vertebrae with cord compression. ? ?See below for additional details  ? ? ?Assessment & Plan: ?  ?Principal Problem: ?  Spinal cord compression ([place]) ?Active Problems: ?  Malignant neoplasm of lower lobe of right lung ([place]) ?  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  Hemoptysis ?  COPD (chronic obstructive pulmonary disease) with chronic bronchitis ([place]) ?  Cancer related pain ?  Headache ?  HTN (hypertension) ?  Protein-calorie malnutrition, severe ?  Goals of care, counseling/discussion ?  Hypokalemia ? ? ?Assessment and Plan: ? ?* Spinal cord compression (HCC) ?Patient presenting with B leg weakness, dfficulty ambulating ?MRI C spine with pathologic compression fx of T3 with bulky enhancing tumor in the upper thoracic spinal cord canal and spinal cord compression, possible mild T3 level cord edema.  Tumor obliterating bilateral T3 and L T2 neural foramina. ?MRI T/L spine with metastatic deposit in T3 verebra with epidural tumor compressing the cord and infiltrating the L J6-2 foramen, uncomplicated metastatic disease at T9, T12, L4, and sacrum.  Degenerative spinal stenosis at L4-5, degenerative bilateral foraminal impingement at L4-5 and L5-S1. ?MRI brain with no intracranial metastatic disease or acute intracranial abnormality ?Neurosurgery recommending proceed with radiation if radiosensitive tumor, could consider cytoreductive surgery if not radiosensitive ?Rad onc planning for 10  fractions of radiotherapy to thoracic spine (started 4/5 - refused 4/6) ?Dexamethasone ?Oncology planning to follow up outpatient after PET scan ?Planning for CIR for ambulatory dysfunction - will need to review with CIR admissions coordinator, had been evaluated earlier in week (4/5 and thought to be candidate, follow up 4/6 family wanted to discharge home, so CIR signed off -> I reconsulted CIR as after discussion, family was more agreeable, but now CIR saying they would not expect significant functional improvement -> will discuss with rehab coordinator 4/11) ? ?Malignant neoplasm of lower lobe of right lung (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 ? ?Hemoptysis ?Continued overnight ?She's receiving radiation to lung mass as well (discussed with oncology/rad onc today) ?No hemoptysis for 48hrs ? ?Headache ?Migrainous in description, trial fioricet ? ?Cancer related pain ?Morphine, oxy ? ?COPD (chronic obstructive pulmonary disease) with chronic bronchitis ([place]) ?Albuterol, dulera, singulair  ? ?HTN (hypertension) ?Atenolol, chlorthalidone ? ?Goals of care, counseling/discussion ?Agreeable to inpatient rehab today ?Will continue to discuss care plan and next steps based on what's important to her ? ? ?DVT prophylaxis: SCD ?Code Status: full ?Family Communication: daughter at bedside ?Disposition:  ? ?Status is: Inpatient ?Remains inpatient  appropriate  because: need for radiation therapy, oncology c/s, therapy ?  ?Consultants:  ?PCCM ?Rad onc ?neurosurgery ? ?Procedures:  ?Flexible bronchoscopy and EBUS Bronchoscopy ? ?Antimicrobials:  ?Anti-infectives (From admission, onward)  ? ? None  ? ?  ? ? ?Subjective: ?C/o headache, R sided, throbbing - like migraine ? ?Objective: ?Vitals:  ? 04/26/21 0414 04/26/21 0742 04/26/21 1011 04/26/21 1328  ?BP: 123/73  (!) 114/51 (!) 110/55  ?Pulse: (!) 56  (!) 54 (!) 54  ?Resp: 16   13  ?Temp: 98.1 ?F (36.7 ?C)   98.1 ?F (36.7 ?C)  ?TempSrc: Oral     ?SpO2: 98% 99%  97%  ?Weight:      ?Height:      ? ? ?Intake/Output Summary (Last 24 hours) at 04/26/2021 1825 ?Last data filed at 04/26/2021 1300 ?Gross per 24 hour  ?Intake 412.07 ml  ?Output 300 ml  ?Net 112.07 ml  ? ?Filed Weights  ? 04/18/21 2258 04/19/21 1421 04/20/21 1232  ?Weight: 43.1 kg 43.4 kg 43.4 kg  ? ? ?Examination: ? ?General: No acute distress. ?Cardiovascular:RRR ?Lungs: unlabored ?Abdomen: Soft, nontender, nondistended ?Neurological: weakness to bilateral LE, discoordination ?Extremities: No clubbing or cyanosis. No edema.  ? ?Data Reviewed: I have personally reviewed following labs and imaging studies ? ?CBC: ?Recent Labs  ?Lab 04/22/21 ?0240 04/23/21 ?9735 04/24/21 ?3299 04/25/21 ?2426 04/26/21 ?8341  ?WBC 14.6* 16.6* 15.0* 17.3* 14.1*  ?NEUTROABS 12.0* 13.0* 12.0*  --  11.7*  ?HGB 9.8* 10.4* 11.3* 11.5* 11.0*  ?HCT 29.6* 32.9* 34.1* 34.2* 33.5*  ?MCV 85.3 89.2 85.5 86.1 86.6  ?PLT 417* 451* 527* 576* 605*  ? ? ?Basic Metabolic Panel: ?Recent Labs  ?Lab 04/22/21 ?9622 04/23/21 ?2979 04/24/21 ?8921 04/25/21 ?1941 04/26/21 ?7408  ?NA 133* 132* 132* 131* 130*  ?K 4.0 3.9 4.2 3.7 4.6  ?CL 97* 96* 94* 93* 94*  ?CO2 _0 ?GLUCOSE 128* 141* 138* 186* 149*  ?BUN _1 25* 22  ?CREATININE 0.54 0.68 0.69 0.66 0.62  ?CALCIUM 9.0 8.8* 8.9 8.8* 9.1  ?MG 2.0 2.0 2.1 2.2 2.1  ?PHOS 3.8 4.0 4.3 4.4 5.0*  ? ? ?GFR: ?Estimated Creatinine Clearance: 41.6  mL/min (by C-G formula based on SCr of 0.62 mg/dL). ? ?Liver Function Tests: ?Recent Labs  ?Lab 04/22/21 ?1448 04/23/21 ?1856 04/24/21 ?3149 04/25/21 ?7026 04/26/21 ?3785  ?AST _2 ?ALT _3 ?ALKPHOS 191* 207* 203* 194* 188*  ?BILITOT 0.5 0.3 0.4 0.3 0.3  ?PROT 6.3* 6.4* 6.6 6.6 6.5  ?ALBUMIN 3.1* 3.1* 3.2* 3.3* 3.2*  ? ? ?CBG: ?Recent Labs  ?Lab 04/25/21 ?1710 04/25/21 ?2114 04/26/21 ?0722 04/26/21 ?1149 04/26/21 ?1728  ?GLUCAP 158* 194* 124* 160* 197*  ? ? ? ?No results found for this or any previous visit (from the past 240 hour(s)).  ? ? ? ? ? ?Radiology Studies: ?No results found. ? ? ? ? ? ?Scheduled Meds: ? atenolol  50 mg Oral Daily  ? And  ? chlorthalidone  25 mg Oral Daily  ? atorvastatin  40 mg Oral Daily  ? dexamethasone  4 mg Oral Q8H  ? docusate sodium  100 mg Oral BID  ? gabapentin  300 mg Oral BID  ? insulin aspart  0-5 Units Subcutaneous QHS  ? insulin aspart  0-9 Units Subcutaneous TID WC  ? mometasone-formoterol  2 puff Inhalation BID  ? montelukast  10 mg Oral QHS  ? multivitamin with minerals  1  tablet Oral Daily  ? ?Continuous Infusions: ? heparin 850 Units/hr (04/26/21 0813)  ? ? ? ? LOS: 7 days  ? ? ?Time spent: over 30 min ? ? ? ?Fayrene Helper, MD ?[facility] ? ? ?To contact the attending provider between 7A-7P or the covering provider during after hours 7P-7A, please log into the web site [email or web address] and access using universal [place] password for that web site. If you do not have the password, please call the hospital operator. ? ?04/26/2021, 6:25 PM  ? ? ?

## 2021-04-27 ENCOUNTER — Other Ambulatory Visit (HOSPITAL_COMMUNITY): Payer: Self-pay

## 2021-04-27 ENCOUNTER — Ambulatory Visit
Admit: 2021-04-27 | Discharge: 2021-04-27 | Disposition: A | Discharge status: Home / Self Care | Payer: Medicare Other | Attending: Radiation Oncology | Admitting: Radiation Oncology

## 2021-04-27 DIAGNOSIS — E871 Hypo-osmolality and hyponatremia: Secondary | ICD-10-CM

## 2021-04-27 LAB — BASIC METABOLIC PANEL
Anion gap: 11 (ref 5–15)
BUN: 30 mg/dL — ABNORMAL HIGH (ref 8–23)
CO2: 25 mmol/L (ref 22–32)
Calcium: 8.9 mg/dL (ref 8.9–10.3)
Chloride: 92 mmol/L — ABNORMAL LOW (ref 98–111)
Creatinine, Ser: 0.65 mg/dL (ref 0.44–1.00)
GFR, Estimated: >60 mL/min (ref >60)
Glucose, Bld: 128 mg/dL — ABNORMAL HIGH (ref 70–99)
Potassium: 4.2 mmol/L (ref 3.5–5.1)
Sodium: 128 mmol/L — ABNORMAL LOW (ref 135–145)

## 2021-04-27 LAB — CBC
HCT: 35.3 % — ABNORMAL LOW (ref 36.0–46.0)
Hemoglobin: 11.5 g/dL — ABNORMAL LOW (ref 12.0–15.0)
MCH: 28.4 pg (ref 26.0–34.0)
MCHC: 32.6 g/dL (ref 30.0–36.0)
MCV: 87.2 fL (ref 80.0–100.0)
Platelets: 600 10*3/uL — ABNORMAL HIGH (ref 150–400)
RBC: 4.05 MIL/uL (ref 3.87–5.11)
RDW: 17.2 % — ABNORMAL HIGH (ref 11.5–15.5)
WBC: 16.7 10*3/uL — ABNORMAL HIGH (ref 4.0–10.5)
nRBC: 0 % (ref 0.0–0.2)

## 2021-04-27 LAB — PHOSPHORUS: Phosphorus: 4.5 mg/dL (ref 2.5–4.6)

## 2021-04-27 LAB — HEPARIN LEVEL (UNFRACTIONATED): Heparin Unfractionated: 0.44 IU/mL (ref 0.30–0.70)

## 2021-04-27 LAB — GLUCOSE, CAPILLARY
Glucose-Capillary: 153 mg/dL — ABNORMAL HIGH (ref 70–99)
Glucose-Capillary: 138 mg/dL — ABNORMAL HIGH (ref 70–99)

## 2021-04-27 LAB — MAGNESIUM: Magnesium: 2.2 mg/dL (ref 1.7–2.4)

## 2021-04-27 MED ORDER — APIXABAN 5 MG PO TABS
5.0000 mg | ORAL_TABLET | Freq: Two times a day (BID) | ORAL | Status: DC
  Administered 2021-04-27: 5 mg via ORAL
  Filled 2021-04-27: qty 1

## 2021-04-27 MED ORDER — DEXAMETHASONE 4 MG PO TABS
4.0000 mg | ORAL_TABLET | Freq: Three times a day (TID) | ORAL | 0 refills | Status: DC
  Filled 2021-04-27: qty 30, 10d supply, fill #0

## 2021-04-27 MED ORDER — OXYCODONE HCL 5 MG PO TABS
5.0000 mg | ORAL_TABLET | Freq: Four times a day (QID) | ORAL | 0 refills | Status: AC | PRN
  Filled 2021-04-27: qty 20, 5d supply, fill #0

## 2021-04-27 MED ORDER — POLYETHYLENE GLYCOL 3350 17 G PO PACK
17.0000 g | PACK | Freq: Every day | ORAL | 0 refills | Status: DC | PRN
Start: 2021-04-27 — End: 2023-01-27
  Filled 2021-04-27: qty 14, 14d supply, fill #0

## 2021-04-27 MED ORDER — ENSURE MAX PROTEIN PO LIQD: 11.0000 [oz_av] | Freq: Every day | ORAL | Status: DC

## 2021-04-27 MED ORDER — ATENOLOL 50 MG PO TABS
50.0000 mg | ORAL_TABLET | Freq: Every day | ORAL | 0 refills | Status: DC
Start: 2021-04-28 — End: 2023-01-27
  Filled 2021-04-27: qty 30, 30d supply, fill #0

## 2021-04-27 MED ORDER — APIXABAN 5 MG PO TABS
5.0000 mg | ORAL_TABLET | Freq: Two times a day (BID) | ORAL | 0 refills | Status: DC
  Filled 2021-04-27: qty 60, 30d supply, fill #0

## 2021-04-27 NOTE — Discharge Summary (Signed)
Physician Discharge Summary  ?Anna Finley OVZ:[id number] DOB: 22-Oct-1945 DOA: 04/18/2021 ? ?PCP: [name], MD ? ?Admit date: 04/18/2021 ?Discharge date: 04/27/2021 ? ?Time spent: 40 minutes ? ?Recommendations for Outpatient Follow-up:  ?Follow outpatient CBC/CMP ?Follow hyponatremia outpatient, we've discontinued chlorthalidone  ?Eliquis without loading dose, follow outpatient for hemoptysis ?Follow with rad onc for steroid taper ?Follow PET scan scheduled 4/13 ?Follow with oncology outpatient ?Continue home health services ?Follow pending cytology studies  ? ?Discharge Diagnoses:  ?Principal Problem: ?  Spinal cord compression ([place]) ?Active Problems: ?  Malignant neoplasm of lower lobe of right lung ([place]) ?  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  Hemoptysis ?  COPD (chronic obstructive pulmonary disease) with chronic bronchitis ([place]) ?  Cancer related pain ?  Headache ?  Hyponatremia ?  HTN (hypertension) ?  Protein-calorie malnutrition, severe ?  Goals of care, counseling/discussion ?  Hypokalemia ? ? ?Discharge Condition: stable ? ?Diet recommendation: heart healthy ? ?Filed Weights  ? 04/19/21 1421 04/20/21 1232 04/27/21 1157  ?Weight: 43.4 kg 43.4 kg 45 kg  ? ? ?History of present illness:  ?76 yo with hx HTN, COPD, and suspected lung cancer based on imaging who was planned for outpatient PET/biopsy presented in interim with difficulty ambulated and neck/shoulder pain.  Due to issues with ambulation, weight loss, hemoptysis she presented to the ED and was found to have metastatic deposit in T3 vertebrae with cord compression. ? ?Pulm was consulted and she's s/p bronch.  Cytology showing metastatic adenocarcinoma.  Rad onc was c/s and she's been started on radiation therapy and dexamethasone.  Evaluated by CIR, not thought to be good candidate.  Family wants her to come home.  Plan at this point for discharge with outpatient follow up with oncology, continued radiation  therapy, dex taper.   ? ?See below for additional details ? ?Hospital Course:  ?Assessment and Plan: ?* Spinal cord compression ([place]) ?Patient presenting with B leg weakness, dfficulty ambulating ?MRI C spine with pathologic compression fx of T3 with bulky enhancing tumor in the upper thoracic spinal cord canal and spinal cord compression, possible mild T3 level cord edema.  Tumor obliterating bilateral T3 and L T2 neural foramina. ?MRI T/L spine with metastatic deposit in T3 verebra with epidural tumor compressing the cord and infiltrating the L A1-2 foramen, uncomplicated metastatic disease at T9, T12, L4, and sacrum.  Degenerative spinal stenosis at L4-5, degenerative bilateral foraminal impingement at L4-5 and L5-S1. ?MRI brain with no intracranial metastatic disease or acute intracranial abnormality ?Neurosurgery recommending proceed with radiation if radiosensitive tumor, could consider cytoreductive surgery if not radiosensitive ?Rad onc planning for 10 fractions of radiotherapy to thoracic spine (started 4/5) ?Dexamethasone discharge with 4 mg TID, taper per rad onc outpatient ?Oncology planning to follow up outpatient after PET scan ?After review with CIR admission coordinator, not thought good candidate for CIR (per discussion with OT, fatigued easily yesterday.  Also, she hasn't walked in [place] few days).  Will plan for home with home health per family request.  ? ?Malignant neoplasm of lower lobe of right lung ([place]) ?S/p flexible bronch and EBUS bronch on 4/4 ?Cytology 04/20/21 with LN with malignant cells c/w metastatic adenocarcinoma  ?Cytology 04/20/21 with endobronchial mass with malignant cells c/w adenocarcinoma ?Malignant cells present in RLL lung washing  ?Follow pending cytology ?Will consult medical oncology -> Dr. Julien Nordmann planning to follow outpatient after PET scan in clinic ?Appreciate pulmonology ? 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  CT with nonocclusive short  segment thrombus within R IJ ?No intracranial metastatic disease ?eliquis 5 mg BID at discharge (no loading dose given her recent hemoptysis) ? ? ?Hemoptysis ?Continued overnight ?She's receiving radiation to lung mass as well (discussed with oncology/rad onc today) ?Resolved, follow on eliquis ? ?Hyponatremia ?Relatively mild, stop chlorthalidone ?Follow outpatient ? ?Headache ?resolved ? ?Cancer related pain ?Morphine, oxy ? ?COPD (chronic obstructive pulmonary disease) with chronic bronchitis ([place]) ?Albuterol, dulera, singulair  ? ?HTN (hypertension) ?Atenolol ?Stop chlorthalidone given hyponatremia ? ? ?Procedures: ?Pulm ? Flexible bronchoscopy and EBUS Bronchoscopy  ? ?Consultations: ?Rad onc ?Pulmonology ?oncology ? ?Discharge Exam: ?Vitals:  ? 04/27/21 0749 04/27/21 1417  ?BP:  96/73  ?Pulse:  67  ?Resp:  16  ?Temp:  98.3 ?F (36.8 ?C)  ?SpO2: 97% 96%  ? ?No complaints ?Discussed with daughter at bedside and Peter Congo over phone ?Eager for discharge home ? ?General: No acute distress. ?Cardiovascular: RRR ?Lungs: unlabored ?Abdomen: Soft, nontender, nondistended ?Neurological: Alert and oriented ?3. Able to lift legs, but ataxic, scissoring legs. ?Skin: Warm and dry. No rashes or lesions. ?Extremities: No clubbing or cyanosis. No edema.  ? ?Discharge Instructions ? ? ?Discharge Instructions   ? ? Call MD for:  difficulty breathing, headache or visual disturbances   Complete by: As directed ?  ? Call MD for:  extreme fatigue   Complete by: As directed ?  ? Call MD for:  hives   Complete by: As directed ?  ? Call MD for:  persistant dizziness or light-headedness   Complete by: As directed ?  ? Call MD for:  persistant nausea and vomiting   Complete by: As directed ?  ? Call MD for:  redness, tenderness, or signs of infection (pain, swelling, redness, odor or green/yellow discharge around incision site)   Complete by: As directed ?  ? Call MD for:  severe uncontrolled pain   Complete by: As directed ?  ? Call MD  for:  temperature >100.4   Complete by: As directed ?  ? Diet - low sodium heart healthy   Complete by: As directed ?  ? Discharge instructions   Complete by: As directed ?  ? You were seen for metastatic adenocarcinoma which has spread to the spine, affecting your ability to walk.  ? ?You've been started on steroids and radiation.  You should follow up with your daily radiation therapies as directed.  Radiation oncology will taper your dose of steroids, continue your steroids and follow their recommendations. ? ?Your bronchoscopy showed metastatic adenocarcinoma.  You should follow up with Dr. Julien Nordmann after your PET scan (scheduled on 4/13). ? ?You've been started on eliquis for your blood clot.  Continue this as prescribed.  If you notice coughing up blood again, stop your eliquis and call your doctor or come to the hospital.  Do not take NSAIDs or aspirin, while you are on the eliquis.  Discuss all over the counter meds with your PCP prior to taking them. ? ?Your sodium level was Kalysta Kneisley little bit low today.  Stop your atenolol and chlorthalidone combination pill.  Chlorthalidone can lower the sodium.  We'll send Coline Calkin prescription for atenolol.  Follow repeat labs with your PCP within Nathanal Hermiz week or so. ? ?We'll send you home with plans to follow with Dr. Julien Nordmann and radiation oncology. ? ?You were not thought to be Neal Trulson good candidate for inpatient rehab, so we'll get you home with home health services. ? ?Return for new, recurrent, or  worsening symptoms. ? ?Please ask your PCP to request records from this hospitalization so they know what was done and what the next steps will be.  ? Increase activity slowly   Complete by: As directed ?  ? ?  ? ?Allergies as of 04/27/2021   ?No Known Allergies ?  ? ?  ?Medication List  ?  ? ?STOP taking these medications   ? ?atenolol-chlorthalidone 50-25 MG tablet ?Commonly known as: TENORETIC ?  ?etodolac 400 MG tablet ?Commonly known as: LODINE ?  ?meloxicam 15 MG tablet ?Commonly known as:  MOBIC ?  ? ?  ? ?TAKE these medications   ? ?acetaminophen 500 MG tablet ?Commonly known as: TYLENOL ?Take 500 mg by mouth every 6 (six) hours as needed for moderate pain or headache. ?  ?atenolol 50 MG tablet ?Commo

## 2021-04-27 NOTE — Progress Notes (Signed)
Nutrition Follow-up ? ?DOCUMENTATION CODES:  ? ?Severe malnutrition in context of chronic illness, Underweight ? ?INTERVENTION:  ?- will order Magic cup TID with meals, each supplement provides 290 kcal and 9 grams of protein. ? ?- will order Ensure Max once/day, each supplement provides 150 kcal and 30 grams protein. ? ?- will liberalize diet from Carb Modified to Regular for patient who is underweight, meets criteria for severe malnutrition, going through treatment for cancer, currently ordered sliding scale only, and CBGs consistently <200 mg/dl. ? ?- re-weigh patient today.  ? ? ?NUTRITION DIAGNOSIS:  ? ?Severe Malnutrition related to cancer and cancer related treatments, chronic illness as evidenced by percent weight loss, moderate fat depletion, severe muscle depletion. -ongoing ? ?GOAL:  ? ?Patient will meet greater than or equal to 90% of their needs -minimally met on average ? ?MONITOR:  ? ?PO intake, Supplement acceptance, Labs, Weight trends ? ?ASSESSMENT:  ? ?76 y.o. female with past medical history of hypertension, COPD and recent diagnosis of lung cancer but no biopsy yet who had seen I card on 04/14/2021 and his clinic with plans for PET scan presented to hospital with the shoulder upper neck pain and difficulty ambulating for 2 weeks. ? ?Patient is currently out of the room to XRT. Flow sheet documentation indicates she has been eating 50-100% at meals over the past 6 days.  ? ?She has not been weighed since 4/4. No information documented in the edema section of flow sheet.  ? ?Per MD note on 4/10, plan at time of d/c was for CIR vs home. Cystology from 4/4 was consistent with RLL metastatic adenocarcinoma.  ?  ? ?Labs reviewed; CBG: 153 mg/dl, Na: 128 mmol/l, Cl: 92 mmol/l, BUN: 30 mg/dl. ? ?Medications reviewed; 100 mg colace BID, sliding scale novolog, 1 tablet multivitamin with minerals/day. ? ? ?Diet Order:   ?Diet Order   ? ?       ?  Diet regular Room service appropriate? Yes; Fluid  consistency: Thin  Diet effective now       ?  ? ?  ?  ? ?  ? ? ?EDUCATION NEEDS:  ? ?No education needs have been identified at this time ? ?Skin:  Skin Assessment: Reviewed RN Assessment ? ?Last BM:  4/6 per flow sheet documentation ? ?Height:  ? ?Ht Readings from Last 1 Encounters:  ?04/20/21 _0  (1.651 m)  ? ? ?Weight:  ? ?Wt Readings from Last 1 Encounters:  ?04/20/21 43.4 kg  ? ? ? ?BMI:  Body mass index is 15.92 kg/m?. ? ?Estimated Nutritional Needs:  ?Kcal:  1700-1900 ?Protein:  80-90g ?Fluid:  1.9L/day ? ? ? ? ?Jarome Matin, MS, RD, LDN ?Registered Dietitian II ?Inpatient Clinical Nutrition ?RD pager # and on-call/weekend pager # available in [place]  ? ?

## 2021-04-27 NOTE — Progress Notes (Signed)
Physical Therapy Treatment ?Patient Details ?Name: Anna Finley ?MRN: [id number] ?DOB: 02/02/1945 ?Today's Date: 04/27/2021 ? ? ?History of Present Illness Patient is 76 y.o. female with recent diagnosis of recent diagnosis of Rt lung cancer. Pt developed significant weight loss hemoptysis with worsening cough and due to these new findings of weakness ambulation patient has been considered for admission to hospital for further evaluation. MRI revealed  T3 compression fracture and bulky tumor in upper thoracic spinal canal causing cord compression.  follow up MRI revealed additional T12, T9 lesions. PMH significant for HTN, COPD, THA. ? ?  ?PT Comments  ? ? Transferred bed to recliner with Stedy, as pt is still having buckling of BLEs in standing. Performed BLE strengthening exercises in sitting.   ?Recommendations for follow up therapy are one component of a multi-disciplinary discharge planning process, led by the attending physician.  Recommendations may be updated based on patient status, additional functional criteria and insurance authorization. ? ?Follow Up Recommendations ? Home health PT ?  ?  ?Assistance Recommended at Discharge Frequent or constant Supervision/Assistance  ?Patient can return home with the following Two people to help with walking and/or transfers;Two people to help with bathing/dressing/bathroom;Assistance with cooking/housework;Assistance with feeding;Direct supervision/assist for medications management;Assist for transportation;Help with stairs or ramp for entrance ?  ?Equipment Recommendations ? Wheelchair (measurements PT);Wheelchair cushion (measurements PT);Rolling walker (2 wheels);BSC/3in1  ?  ?Recommendations for Other Services Rehab consult;OT consult ? ? ?  ?Precautions / Restrictions Precautions ?Precautions: Fall ?Precaution Comments: A LOT of falls per daughter ?Restrictions ?Weight Bearing Restrictions: No  ?  ? ?Mobility ? Bed Mobility ?  ?Bed Mobility: Supine to Sit ?  ?   ?Supine to sit: Min assist ?  ?  ?General bed mobility comments: assist with RLE and close guarding to safely elevate trunk d/t ataxia. (pt initially moved RLE off bed and left LLE  on the bed with diminished  awareness) ?  ? ?Transfers ?Overall transfer level: Needs assistance ?  ?Transfers: Bed to chair/wheelchair/BSC ?Sit to Stand: Min assist ?  ?Step pivot transfers: Total assist ?  ?  ?  ?General transfer comment: min A to stand with Stedy, BLEs buckled immediately in standing in [place] ?Transfer via Lift Equipment: Stedy ? ?Ambulation/Gait ?  ?  ?  ?  ?  ?  ?  ?  ? ? ?Stairs ?  ?  ?  ?  ?  ? ? ?Wheelchair Mobility ?  ? ?Modified Rankin (Stroke Patients Only) ?  ? ? ?  ?Balance Overall balance assessment: History of Falls, Needs assistance ?Sitting-balance support: Feet supported ?Sitting balance-Leahy Scale: Fair ?Sitting balance - Comments: with BUE support, with no UE support sitting balance is poor ?  ?  ?  ?  ?  ?  ?  ?  ?  ?  ?  ?  ?  ?  ?  ?  ? ?  ?Cognition Arousal/Alertness: Awake/alert ?Behavior During Therapy: [facility] for tasks assessed/performed ?Overall Cognitive Status: Within Functional Limits for tasks assessed ?  ?  ?  ?  ?  ?  ?  ?  ?  ?  ?  ?  ?  ?  ?  ?  ?  ?  ?  ? ?  ?Exercises General Exercises - Lower Extremity ?Ankle Circles/Pumps: AROM, Both, 20 reps, Supine ?Long Arc Quad: AROM, Both, Seated, 20 reps ?Hip Flexion/Marching: AROM, Both, 10 reps, Seated ? ?  ?General Comments   ?  ?  ? ?Pertinent  Vitals/Pain Pain Assessment ?Pain Score: 0-No pain ?Faces Pain Scale: No hurt  ? ? ?Home Living   ?  ?  ?  ?  ?  ?  ?  ?  ?  ?   ?  ?Prior Function    ?  ?  ?   ? ?PT Goals (current goals can now be found in the care plan section) Acute Rehab PT Goals ?Patient Stated Goal: increase independence and get home to family ?PT Goal Formulation: With patient/family ?Time For Goal Achievement: 05/05/21 ?Potential to Achieve Goals: Fair ?Progress towards PT goals: Progressing toward goals ? ?   ?Frequency ? ? ? Min 3X/week ? ? ? ?  ?PT Plan Current plan remains appropriate  ? ? ?Co-evaluation   ?  ?  ?  ?  ? ?  ?AM-PAC PT "6 Clicks" Mobility   ?Outcome Measure ? Help needed turning from your back to your side while in a flat bed without using bedrails?: A Little ?Help needed moving from lying on your back to sitting on the side of a flat bed without using bedrails?: A Little ?Help needed moving to and from a bed to a chair (including a wheelchair)?: Total ?Help needed standing up from a chair using your arms (e.g., wheelchair or bedside chair)?: Total ?Help needed to walk in hospital room?: Total ?Help needed climbing 3-5 steps with a railing? : Total ?6 Click Score: 10 ? ?  ?End of Session Equipment Utilized During Treatment: Gait belt ?Activity Tolerance: Patient tolerated treatment well ?Patient left: in chair;with call bell/phone within reach;with chair alarm set ?Nurse Communication: Mobility status ?PT Visit Diagnosis: Unsteadiness on feet (R26.81);Other abnormalities of gait and mobility (R26.89);Muscle weakness (generalized) (M62.81);Difficulty in walking, not elsewhere classified (R26.2);Other symptoms and signs involving the nervous system (R29.898);History of falling (Z91.81);Ataxic gait (R26.0) ?  ? ? ?Time: 9458-5929 ?PT Time Calculation (min) (ACUTE ONLY): 18 min ? ?Charges:  $Therapeutic Activity: 8-22 mins          ?          ?Blondell Reveal Kistler PT 04/27/2021  ?Acute Rehabilitation Services ?Pager [phone] ?Office [phone] ? ? ?

## 2021-04-27 NOTE — Progress Notes (Addendum)
RNCM received call regarding discharge planning. Patient and family has chosen to discharge with [facility] PT, OT, RN, HHA. This RNCM spoke with patient at bedside who suggested I contact her daughter Peter Congo.  ? ?This RNCM spoke with Peter Congo this RNCM advised daughter that it's after hours however this [place] referral will be sent and if a [facility] agency accepts patient they will contact them in the next 24-48 hours. Daughter verbalized understanding. Patient's daughter also verbalized a family member will be home at all times however patient may not be comfortable with her grandson's taking care of certain care needs.  ?RNCM awaiting response from multiple [facility] agencies.  ?TOC will continue to follow. HHC does not affect patient being discharged today. Bedside RN notified. ?

## 2021-04-27 NOTE — Progress Notes (Addendum)
ANTICOAGULATION CONSULT NOTE - Follow Up Consult ? ?Pharmacy Consult for Heparin >> Eliquis ?Indication:  R IJ thrombus ? ?No Known Allergies ? ?Patient Measurements: ?Height: 5\' 5"  (165.1 cm) ?Weight: 43.4 kg (95 lb 10.9 oz) ?IBW/kg (Calculated) : 57 ?Heparin Dosing Weight:  43.4kg ? ?Vital Signs: ?Temp: 98.2 ?F (36.8 ?C) (04/11 8335) ?Temp Source: Oral (04/11 0548) ?BP: 100/62 (04/11 0548) ?Pulse Rate: 89 (04/11 0548) ? ?Labs: ?Recent Labs  ?  04/25/21 ?8251 04/25/21 ?1953 04/26/21 ?8984 04/27/21 ?2103  ?HGB 11.5*  --  11.0* 11.5*  ?HCT 34.2*  --  33.5* 35.3*  ?PLT 576*  --  605* 600*  ?HEPARINUNFRC 0.20* 0.19* 0.43 0.44  ?CREATININE 0.66  --  0.62 0.65  ? ? ?Estimated Creatinine Clearance: 41.6 mL/min (by C-G formula based on SCr of 0.65 mg/dL). ? ?Assessment: ?AC/Heme: right IJ thrombus-->  IV heparin  ?- 4/5 CT neck: Nonocclusive short segment thrombus within the right internal jugular vein just inferior to the skull base ?Significant Events: heparin drip discontinued on 4/6 PM due to hemoptysis --> resolved as of 4/8 and heparin drip resumed ? ?Today, 04/27/21 ?- Heparin level therapeutic at 0.44 on heparin 850 units/hr ?- Pharmacy consulted to transition heparin drip to Eliquis 5mg  PO BID (no load per MD given hemoptysis this admit) ?- CBC remains stable ? ?Goal of Therapy:  ?Monitor platelets by anticoagulation protocol: Yes ?  ?Plan:  ?Discontinue heparin drip ?Begin Eliquis 5mg  PO BID at time of heparin discontinuation ?Pharmacy to provide education and coupon prior to discharge ? ?Noted that patient is on etodolac BID and meloxicam PRN prior to admission, both of which can increase risk of bleeding with Eliquis.  Recommend to discontinue PRN meloxicam and use Tylenol at discharge.  ? ?Dimple Nanas, PharmD ?04/27/2021 7:05 AM ? ?

## 2021-04-27 NOTE — Assessment & Plan Note (Signed)
Relatively mild, stop chlorthalidone ?Follow outpatient ?

## 2021-04-28 ENCOUNTER — Other Ambulatory Visit: Payer: Self-pay

## 2021-04-28 ENCOUNTER — Inpatient Hospital Stay: Payer: Medicare Other

## 2021-04-28 ENCOUNTER — Ambulatory Visit
Admission: RE | Admit: 2021-04-28 | Discharge: 2021-04-28 | Disposition: A | Discharge status: Home / Self Care | Payer: Medicare Other | Source: Ambulatory Visit | Attending: Radiation Oncology | Admitting: Radiation Oncology

## 2021-04-28 DIAGNOSIS — Z7901 Long term (current) use of anticoagulants: Secondary | ICD-10-CM | POA: Insufficient documentation

## 2021-04-28 DIAGNOSIS — C7971 Secondary malignant neoplasm of right adrenal gland: Secondary | ICD-10-CM | POA: Diagnosis not present

## 2021-04-28 DIAGNOSIS — Z510 Encounter for antineoplastic radiation therapy: Secondary | ICD-10-CM | POA: Diagnosis present

## 2021-04-28 DIAGNOSIS — C3431 Malignant neoplasm of lower lobe, right bronchus or lung: Secondary | ICD-10-CM | POA: Insufficient documentation

## 2021-04-28 DIAGNOSIS — C3490 Malignant neoplasm of unspecified part of unspecified bronchus or lung: Secondary | ICD-10-CM

## 2021-04-28 DIAGNOSIS — R262 Difficulty in walking, not elsewhere classified: Secondary | ICD-10-CM | POA: Diagnosis not present

## 2021-04-28 DIAGNOSIS — C7951 Secondary malignant neoplasm of bone: Secondary | ICD-10-CM | POA: Insufficient documentation

## 2021-04-28 DIAGNOSIS — R270 Ataxia, unspecified: Secondary | ICD-10-CM | POA: Insufficient documentation

## 2021-04-28 DIAGNOSIS — Z79899 Other long term (current) drug therapy: Secondary | ICD-10-CM | POA: Insufficient documentation

## 2021-04-28 DIAGNOSIS — G9529 Other cord compression: Secondary | ICD-10-CM | POA: Diagnosis not present

## 2021-04-28 DIAGNOSIS — Z87891 Personal history of nicotine dependence: Secondary | ICD-10-CM | POA: Diagnosis not present

## 2021-04-28 LAB — CBC WITH DIFFERENTIAL (CANCER CENTER ONLY)
Abs Immature Granulocytes: 0.2 10*3/uL — ABNORMAL HIGH (ref 0.00–0.07)
Basophils Absolute: 0 10*3/uL (ref 0.0–0.1)
Basophils Relative: 0 %
Eosinophils Absolute: 0 10*3/uL (ref 0.0–0.5)
Eosinophils Relative: 0 %
HCT: 34.1 % — ABNORMAL LOW (ref 36.0–46.0)
Hemoglobin: 11.4 g/dL — ABNORMAL LOW (ref 12.0–15.0)
Immature Granulocytes: 1 %
Lymphocytes Relative: 7 %
Lymphs Abs: 1.1 10*3/uL (ref 0.7–4.0)
MCH: 28.3 pg (ref 26.0–34.0)
MCHC: 33.4 g/dL (ref 30.0–36.0)
MCV: 84.6 fL (ref 80.0–100.0)
Monocytes Absolute: 1.1 10*3/uL — ABNORMAL HIGH (ref 0.1–1.0)
Monocytes Relative: 7 %
Neutro Abs: 13.8 10*3/uL — ABNORMAL HIGH (ref 1.7–7.7)
Neutrophils Relative %: 85 %
Platelet Count: 553 10*3/uL — ABNORMAL HIGH (ref 150–400)
RBC: 4.03 MIL/uL (ref 3.87–5.11)
RDW: 17.6 % — ABNORMAL HIGH (ref 11.5–15.5)
WBC Count: 16.3 10*3/uL — ABNORMAL HIGH (ref 4.0–10.5)
nRBC: 0 % (ref 0.0–0.2)

## 2021-04-28 LAB — CMP (CANCER CENTER ONLY)
ALT: 21 U/L (ref 0–44)
AST: 17 U/L (ref 15–41)
Albumin: 3.3 g/dL — ABNORMAL LOW (ref 3.5–5.0)
Alkaline Phosphatase: 158 U/L — ABNORMAL HIGH (ref 38–126)
Anion gap: 10 (ref 5–15)
BUN: 30 mg/dL — ABNORMAL HIGH (ref 8–23)
CO2: 24 mmol/L (ref 22–32)
Calcium: 9 mg/dL (ref 8.9–10.3)
Chloride: 93 mmol/L — ABNORMAL LOW (ref 98–111)
Creatinine: 0.42 mg/dL — ABNORMAL LOW (ref 0.44–1.00)
GFR, Estimated: >60 mL/min (ref >60)
Glucose, Bld: 136 mg/dL — ABNORMAL HIGH (ref 70–99)
Potassium: 4.5 mmol/L (ref 3.5–5.1)
Sodium: 127 mmol/L — ABNORMAL LOW (ref 135–145)
Total Bilirubin: 0.4 mg/dL (ref 0.3–1.2)
Total Protein: 6.7 g/dL (ref 6.5–8.1)

## 2021-04-29 ENCOUNTER — Inpatient Hospital Stay (HOSPITAL_BASED_OUTPATIENT_CLINIC_OR_DEPARTMENT_OTHER): Payer: Medicare Other | Admitting: Internal Medicine

## 2021-04-29 ENCOUNTER — Inpatient Hospital Stay: Payer: Medicare Other | Admitting: Internal Medicine

## 2021-04-29 ENCOUNTER — Other Ambulatory Visit: Payer: Self-pay | Admitting: *Deleted

## 2021-04-29 ENCOUNTER — Telehealth: Payer: Self-pay

## 2021-04-29 ENCOUNTER — Ambulatory Visit
Admission: RE | Admit: 2021-04-29 | Discharge: 2021-04-29 | Disposition: A | Discharge status: Home / Self Care | Payer: Medicare Other | Source: Ambulatory Visit | Attending: Radiation Oncology | Admitting: Radiation Oncology

## 2021-04-29 ENCOUNTER — Encounter: Payer: Self-pay | Admitting: *Deleted

## 2021-04-29 ENCOUNTER — Ambulatory Visit (HOSPITAL_COMMUNITY)
Admission: RE | Admit: 2021-04-29 | Discharge: 2021-04-29 | Disposition: A | Discharge status: Home / Self Care | Payer: Medicare Other | Source: Ambulatory Visit | Attending: Pulmonary Disease | Admitting: Pulmonary Disease

## 2021-04-29 ENCOUNTER — Other Ambulatory Visit: Payer: Self-pay

## 2021-04-29 VITALS — BP 121/62 | HR 57 | Temp 98.4°F | Resp 15

## 2021-04-29 DIAGNOSIS — C3490 Malignant neoplasm of unspecified part of unspecified bronchus or lung: Secondary | ICD-10-CM | POA: Insufficient documentation

## 2021-04-29 DIAGNOSIS — C3431 Malignant neoplasm of lower lobe, right bronchus or lung: Secondary | ICD-10-CM | POA: Diagnosis not present

## 2021-04-29 DIAGNOSIS — R918 Other nonspecific abnormal finding of lung field: Secondary | ICD-10-CM | POA: Insufficient documentation

## 2021-04-29 DIAGNOSIS — Z7189 Other specified counseling: Secondary | ICD-10-CM

## 2021-04-29 DIAGNOSIS — Z510 Encounter for antineoplastic radiation therapy: Secondary | ICD-10-CM | POA: Diagnosis not present

## 2021-04-29 LAB — GLUCOSE, CAPILLARY
Glucose-Capillary: 154 mg/dL — ABNORMAL HIGH (ref 70–99)
Glucose-Capillary: 133 mg/dL — ABNORMAL HIGH (ref 70–99)
Glucose-Capillary: 216 mg/dL — ABNORMAL HIGH (ref 70–99)
Glucose-Capillary: 130 mg/dL — ABNORMAL HIGH (ref 70–99)

## 2021-04-29 IMAGING — PT NM PET TUM IMG INITIAL (PI) SKULL BASE T - THIGH
1 of 8 series · 1 of 25 positions shown · non-contrast
Comparison: Chest CT [DATE]. MRI thoracolumbar spine
[DATE]. Neck CTA [DATE].

CLINICAL DATA: Initial treatment strategy for non-small cell lung
cancer.

EXAM:
NUCLEAR MEDICINE PET SKULL BASE TO THIGH
TECHNIQUE: 4.95 mCi F-18 FDG was injected intravenously. Full-ring PET imaging
was performed from the skull base to thigh after the radiotracer. CT
data was obtained and used for attenuation correction and anatomic
localization.
Fasting blood glucose: 130 mg/dl

[Series 4: ct sk_thigh 5.0 br38 · axial · 5.0mm · 0.98mm/px · 1 of 185 slices shown]
[im 185/185  brain]
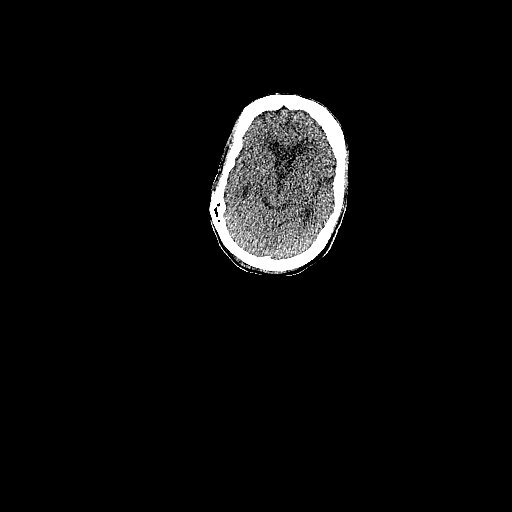

[1 of 25 positions shown; findings below may reference images not displayed]

FINDINGS: Mediastinal blood pool activity: SUV max

NECK:

No hypermetabolic cervical lymph nodes are identified.There are no
lesions of the pharyngeal mucosal space.

Incidental CT findings: Bilateral carotid atherosclerosis.

CHEST:

The previously demonstrated large right lower lobe mass is intensely
hypermetabolic. This mass measures 6.4 x 3.2 cm on image 41/7 and
has an SUV max of 12.8. There is probable contiguous hypermetabolic
right hilar adenopathy. There is a small subcarinal node which is
mildly hypermetabolic (SUV max 4.8). No other hypermetabolic
thoracic lymph nodes are identified. No other hypermetabolic
pulmonary activity is demonstrated. However, the patient has
developed complete collapse of the left lower lobe since the
previous CT, without evidence of associated hypermetabolic activity.
Diffuse esophageal hypermetabolic activity, likely due to
esophagitis.

Incidental CT findings: Stable part solid right lower lobe nodules,
too small to evaluate by PET-CT. Stable changes of posterior
obstructive pneumonitis in the right lower lobe and underlying
emphysema. Atherosclerosis of the aorta, great vessels and coronary
arteries.

ABDOMEN/PELVIS:

Hypermetabolic right adrenal mass measuring 2.5 x 1.5 cm on image
94/4 (SUV max 15.1). No hypermetabolic activity within the liver,
spleen, pancreas or left adrenal gland. There is no hypermetabolic
nodal activity. Indeterminate focal hypermetabolic activity in the
right lower quadrant near the ileocecal valve (SUV max 9.1). No
other abnormal focal bowel activity identified.

Incidental CT findings: Prominent stool throughout the colon.
Calcified uterine fibroids. Diffuse aortic and branch vessel
atherosclerosis.

SKELETON:

Multifocal hypermetabolic osseous metastatic disease as seen on
previous MRI. The T3 lesion demonstrates a progressive pathologic
fracture since baseline chest CT and is intensely hypermetabolic
with an SUV max of 9.5. As seen on MRI, this is associated with
epidural tumor and possible mass effect on the cord. Other
representative lesions include a T12 metastasis (SUV max 13.7) and a
small metastasis L4 metastasis (SUV max 11.1). Small metastases are
also noted within the ribs and bony pelvis.

Incidental CT findings: As above, progressive pathologic fracture at
T3. Mild multilevel spondylosis. Previous left total hip
arthroplasty.
IMPRESSION: 1. The known large right lower lobe mass is intensely hypermetabolic
consistent with known lung cancer. Probable associated contiguous
right hilar and subcarinal metastatic nodes with right adrenal and
multifocal osseous metastatic disease.
2. Interval development of complete left lower lobe collapse without
associated hypermetabolic lesion.
3. Indeterminate hypermetabolic lesion in the right lower quadrant
of the abdomen near the ileocecal valve. This could reflect an
incidental colonic polyp and has no clear CT correlate.
4. Progressive T3 fracture from chest CT of 1 month ago with
associated epidural tumor (as seen on recent MRI).

## 2021-04-29 MED ORDER — FLUDEOXYGLUCOSE F - 18 (FDG) INJECTION
5.2000 | Freq: Once | INTRAVENOUS | Status: AC | PRN
  Administered 2021-04-29: 4.95 via INTRAVENOUS

## 2021-04-29 NOTE — Telephone Encounter (Signed)
RNCM contacted multiple [place] agencies to include ([place], [place], [place], [place] and [place]) all [facility] agencies are unable to accept patient due to staffing or maxing out on her insurance provider for the month.  ?RNCM will notify the patient's daughter and PCP of multiple HHC decline. ?

## 2021-04-29 NOTE — Progress Notes (Signed)
The proposed treatment discussed in cancer conference is for discussion purpose only and is not a binding recommendation. The patient was not physically examined nor present for their treatment options. Therefore, final treatment plans cannot be decided.  ?

## 2021-04-29 NOTE — Progress Notes (Signed)
Oncology Nurse Navigator Documentation ? ? ?  04/29/2021  ?  9:00 AM  ?Oncology Nurse Navigator Flowsheets  ?Navigator Location [facility]  ?Navigator Encounter Type Pathology Review  ?Patient Visit Type Other  ?Treatment Phase Pre-Tx/Tx Discussion  ?Barriers/Navigation Needs Coordination of Care/I received a message from pathology dept needing icd and staging information to send molecular and PDL 1 to FO.  Per Dr. Worthy Flank note, I updated them.   ?Interventions Coordination of Care  ?Acuity Level 2-Minimal Needs (1-2 Barriers Identified)  ?Coordination of Care Pathology  ?Time Spent with Patient 30  ?  ?

## 2021-04-29 NOTE — Progress Notes (Signed)
?    [place] ?Telephone:(336) [phone]   [phone] ? ?OFFICE PROGRESS NOTE ? ?[name], MD ?[address] ?[place] [address] ? ?DIAGNOSIS: Stage IV (T3, N2, M1 C) non-small cell lung cancer, adenocarcinoma presented with large right lower lobe lung mass in addition to subcarinal lymphadenopathy as well as bone metastasis diagnosed in April 2023 ? ?PRIOR THERAPY: Palliative radiotherapy to the metastatic T3 bone lesion under the care of Dr. Lisbeth Renshaw ? ?CURRENT THERAPY: None ? ?INTERVAL HISTORY: ?Anna Finley 76 y.o. female returns to the clinic today for hospital follow-up visit accompanied by her daughter Anna Finley.  The patient is feeling fine except for the generalized fatigue and weakness as well as weight loss and lack of appetite.  She also complain of back pain.  She is currently undergoing palliative radiotherapy to the metastatic T3 bone lesion under the care of Dr. Lisbeth Renshaw.  She was seen during her hospitalization for the recently diagnosed non-small cell lung cancer.  The patient denied having any current chest pain but has mild shortness of breath and cough with no hemoptysis.  She denied having any fever or chills.  She has no nausea, vomiting, diarrhea or constipation.  She has no headache or visual changes.  She had a PET scan performed earlier today but the final report is still pending.  She is here for evaluation and discussion of her treatment options. ? ?MEDICAL HISTORY: ?Past Medical History:  ?Diagnosis Date  ? COPD (chronic obstructive pulmonary disease) with chronic bronchitis ([place])   ? HTN (hypertension)   ? Pneumothorax   ? ? ?ALLERGIES:  has No Known Allergies. ? ?MEDICATIONS:  ?Current Outpatient Medications  ?Medication Sig Dispense Refill  ? acetaminophen (TYLENOL) 500 MG tablet Take 500 mg by mouth every 6 (six) hours as needed for moderate pain or headache.    ? apixaban (ELIQUIS) 5 MG TABS tablet Take 1 tablet (5 mg total) by mouth 2  (two) times daily. Follow up with Dr. Julien Nordmann for additional refills 60 tablet 0  ? atenolol (TENORMIN) 50 MG tablet Take 1 tablet (50 mg total) by mouth daily. 30 tablet 0  ? atorvastatin (LIPITOR) 40 MG tablet Take 40 mg by mouth daily.    ? dexamethasone (DECADRON) 4 MG tablet Take 1 tablet (4 mg total) by mouth every 8 (eight) hours for 10 days. Radiation oncology will taper your steroids, follow up with them for additional recommendations 30 tablet 0  ? gabapentin (NEURONTIN) 300 MG capsule Take 300 mg by mouth 2 (two) times daily.    ? metFORMIN (GLUCOPHAGE) 500 MG tablet Take 500 mg by mouth daily.    ? montelukast (SINGULAIR) 10 MG tablet Take 10 mg by mouth at bedtime.    ? oxyCODONE (OXY IR/ROXICODONE) 5 MG immediate release tablet Take 1 tablet (5 mg total) by mouth every 6 (six) hours as needed for up to 5 days for moderate pain. 20 tablet 0  ? polyethylene glycol (MIRALAX / GLYCOLAX) 17 g packet Take 17 g by mouth daily as needed for mild constipation. Meds to bed, thanks 14 each 0  ? promethazine-dextromethorphan (PROMETHAZINE-DM) 6.25-15 MG/5ML syrup Take 5 mLs by mouth 4 (four) times daily as needed for cough.    ? WIXELA INHUB 250-50 MCG/ACT AEPB 1 puff 2 (two) times daily.    ? ?No current facility-administered medications for this visit.  ? ? ?SURGICAL HISTORY:  ?Past Surgical History:  ?Procedure Laterality Date  ? BRONCHIAL NEEDLE ASPIRATION  BIOPSY  04/20/2021  ? Procedure: BRONCHIAL NEEDLE ASPIRATION BIOPSIES;  Surgeon: Candee Furbish, MD;  Location: [facility];  Service: Endoscopy;;  ? BRONCHIAL WASHINGS  04/20/2021  ? Procedure: BRONCHIAL WASHINGS;  Surgeon: Candee Furbish, MD;  Location: [facility];  Service: Endoscopy;;  ? ENDOBRONCHIAL ULTRASOUND N/A 04/20/2021  ? Procedure: ENDOBRONCHIAL ULTRASOUND;  Surgeon: Candee Furbish, MD;  Location: [facility];  Service: Endoscopy;  Laterality: N/A;  ? HIP ARTHROPLASTY    ? VIDEO BRONCHOSCOPY Left 04/20/2021  ? Procedure: VIDEO BRONCHOSCOPY WITH  FLUORO;  Surgeon: Candee Furbish, MD;  Location: [facility];  Service: Endoscopy;  Laterality: Left;  EBUS  ? ? ?REVIEW OF SYSTEMS:  Constitutional: positive for anorexia, fatigue, and weight loss ?Eyes: negative ?Ears, nose, mouth, throat, and face: negative ?Respiratory: positive for cough and dyspnea on exertion ?Cardiovascular: negative ?Gastrointestinal: negative ?Genitourinary:negative ?Integument/breast: negative ?Hematologic/lymphatic: negative ?Musculoskeletal:positive for back pain and bone pain ?Neurological: negative ?Behavioral/Psych: negative ?Endocrine: negative ?Allergic/Immunologic: negative  ? ?PHYSICAL EXAMINATION: General appearance: alert, cooperative, appears older than stated age, cachectic, and fatigued ?Head: Normocephalic, without obvious abnormality, atraumatic ?Neck: no adenopathy, no JVD, supple, symmetrical, trachea midline, and thyroid not enlarged, symmetric, no tenderness/mass/nodules ?Lymph nodes: Cervical, supraclavicular, and axillary nodes normal. ?Resp: clear to auscultation bilaterally ?Back: symmetric, no curvature. ROM normal. No CVA tenderness. ?Cardio: regular rate and rhythm, S1, S2 normal, no murmur, click, rub or gallop ?GI: soft, non-tender; bowel sounds normal; no masses,  no organomegaly ?Extremities: extremities normal, atraumatic, no cyanosis or edema ?Neurologic: Alert and oriented X 3, normal strength and tone. Normal symmetric reflexes. Normal coordination and gait ? ?ECOG PERFORMANCE STATUS: 2 - Symptomatic, <50% confined to bed ? ?Blood pressure 121/62, pulse (!) 57, temperature 98.4 ?F (36.9 ?C), temperature source Tympanic, resp. rate 15, SpO2 94 %. ? ?LABORATORY DATA: ?Lab Results  ?Component Value Date  ? WBC 16.3 (H) 04/28/2021  ? HGB 11.4 (L) 04/28/2021  ? HCT 34.1 (L) 04/28/2021  ? MCV 84.6 04/28/2021  ? PLT 553 (H) 04/28/2021  ? ? ?  Chemistry   ?   ?Component Value Date/Time  ? NA 127 (L) 04/28/2021 1554  ? K 4.5 04/28/2021 1554  ? CL 93 (L)  04/28/2021 1554  ? CO2 24 04/28/2021 1554  ? BUN 30 (H) 04/28/2021 1554  ? CREATININE 0.42 (L) 04/28/2021 1554  ?    ?Component Value Date/Time  ? CALCIUM 9.0 04/28/2021 1554  ? ALKPHOS 158 (H) 04/28/2021 1554  ? AST 17 04/28/2021 1554  ? ALT 21 04/28/2021 1554  ? BILITOT 0.4 04/28/2021 1554  ?  ? ? ? ?RADIOGRAPHIC STUDIES: ?CT Head Wo Contrast ? ?Result Date: 04/19/2021 ?CLINICAL DATA:  Ataxia.  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 EXAM: CT HEAD WITHOUT CONTRAST TECHNIQUE: Contiguous axial images were obtained from the base of the skull through the vertex without intravenous contrast. RADIATION DOSE REDUCTION: This exam was performed according to the departmental dose-optimization program which includes automated exposure control, adjustment of the mA and/or kV according to patient size and/or use of iterative reconstruction technique. COMPARISON:  04/16/2011 FINDINGS: Brain: Diffuse cerebral atrophy. Ventricular dilatation consistent with central atrophy. Low-attenuation changes in the deep white matter consistent with small vessel ischemia. No abnormal extra-axial fluid collections. No mass effect or midline shift. Gray-white matter junctions are distinct. Basal cisterns are not effaced. No acute intracranial hemorrhage. No focal lesions are identified that might indicate metastatic disease. However, MRI is more sensitive for evaluation of metastatic lesions. Vascular: Intracranial arterial vascular calcifications. Skull:  Calvarium appears intact. No acute depressed skull fractures. No focal bone lesions. Sinuses/Orbits: Paranasal sinuses demonstrate opacification of the sphenoid sinuses. Mastoid air cells are clear. 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 IMPRESSION: 1. No acute intracranial abnormalities. Chronic atrophy and small vessel ischemic changes. 2. No focal intracranial lesions are demonstrated. However, MRI is more sensitive for evaluation of intracranial metastatic disease.  Electronically Signed   By: Lucienne Capers M.D.   On: 04/19/2021 00:04  ? ?CT ANGIO NECK W OR WO CONTRAST ? ?Result Date: 04/21/2021 ?CLINICAL DATA:  abnormal MRI, concern for jugular vein thrombosis vs right ca

## 2021-04-29 NOTE — Progress Notes (Signed)
Oncology Nurse Navigator Documentation ? ? ?  04/29/2021  ? 11:00 AM 04/29/2021  ?  9:00 AM  ?Oncology Nurse Navigator Flowsheets  ?Navigator Location [facility]  ?Navigator Encounter Type Other: Pathology Review  ?Patient Visit Type Other Other  ?Treatment Phase Pre-Tx/Tx Discussion Pre-Tx/Tx Discussion  ?Barriers/Navigation Needs Coordination of Care/patient's case was discussed in cancer conference today. See TB flow sheet for documentation.  Coordination of Care  ?Interventions Coordination of Care Coordination of Care  ?Acuity Level 2-Minimal Needs (1-2 Barriers Identified) Level 2-Minimal Needs (1-2 Barriers Identified)  ?Coordination of Care Other Pathology  ?Time Spent with Patient 30 30  ?  ?

## 2021-04-29 NOTE — Patient Instructions (Signed)
Lung Cancer ?Lung cancer is an abnormal growth of cancerous cells that forms a mass (malignant tumor) in a lung. There are several types of lung cancer. The types are based on the appearance of the tumor cells. The two most common types are: ?Non-small cell lung cancer. This type of lung cancer is the most common type. Non-small cell lung cancers include squamous cell carcinoma, adenocarcinoma, and large cell carcinoma. ?Small cell lung cancer. In this type of lung cancer, abnormal cells are smaller than those of non-small cell lung cancer. Small cell lung cancer gets worse (progresses) faster than non-small cell lung cancer. ?What are the causes? ?The most common cause of lung cancer is smoking tobacco. The second most common cause is exposure to a chemical called radon. ?What increases the risk? ?You are more likely to develop this condition if: ?You smoke tobacco. ?You have been exposed to: ?Secondhand tobacco smoke. ?Radon gas. ?Uranium. ?Asbestos. ?Arsenic in drinking water. ?Air pollution and diesel exhaust. ?You have a family or personal history of lung cancer. ?You have had lung radiation therapy in the past. ?You are older than age 68. ?What are the signs or symptoms? ?In the early stages, you may not have any symptoms. As the cancer progresses, symptoms may include: ?A lasting cough, possibly with blood. ?Fatigue. ?Unexplained weight loss. ?Shortness of breath. ?High-pitched whistling sounds when you breathe, most often when you breathe out (wheezing). ?Chest pain. ?Loss of appetite. ?Symptoms of advanced lung cancer include: ?Hoarseness. ?Bone or joint pain. ?Weakness. ?Change in the structure of the fingernails (clubbing), so that the nail looks like an upside-down spoon. ?Swelling of the face or arms. ?Inability to move the face (paralysis). ?Drooping eyelids. ?How is this diagnosed? ?This condition may be diagnosed based on: ?Your symptoms and medical history. ?A physical exam. ?A chest X-ray. ?A CT  scan. ?Blood tests. ?Sputum tests. ?Removal of a sample of lung tissue (lung biopsy) for testing. ?Your cancer will be assessed (staged) to determine how severe it is and how much it has spread (metastasized). ?How is this treated? ?Treatment depends on the type and stage of your cancer. Treatment may include one or more of the following: ?Surgery to remove as much of the cancer as possible. Lymph nodes in the area may be removed and tested for cancer as well. ?Medicines that kill cancer cells (chemotherapy). ?High-energy rays that kill cancer cells (radiation therapy). ?Targeted therapy. This targets specific parts of cancer cells and the area around them to block the growth and spread of the cancer. Targeted therapy can help limit the damage to healthy cells. ?Immunotherapy. This treatment uses a person's own immune system to fight cancer by either boosting the immune system or changing how the immune system works. ?Follow these instructions at home: ? ?Do not use any products that contain nicotine or tobacco. These products include cigarettes, chewing tobacco, and vaping devices, such as e-cigarettes. If you need help quitting, ask your health care provider. ?Do not drink alcohol. ?If you are admitted to the hospital, make sure your cancer specialist (oncologist) is aware. Your cancer may affect your treatment for other conditions. ?Take over-the-counter and prescription medicines only as told by your health care provider. ?Work with your health care provider to manage any side effects of treatment. ?Keep all follow-up visits. This is important. ?Where to find support ?Consider joining a local support group for people who have been diagnosed with lung cancer. ?Where to find more information ?American Cancer Society: www.cancer.org ?[place]):  www.cancer.gov ?Contact a health care provider if you: ?Lose weight without trying. ?Have a persistent cough and wheezing. ?Feel short of breath. ?Get  tired easily. ?Have bone or joint pain. ?Have difficulty swallowing. ?Notice that your voice is changing or getting hoarse. ?Have pain that does not get better with medicine. ?Get help right away if you: ?Cough up blood. ?Have chest pain or new breathing problems. ?Have a fever. ?Have swelling in an ankle, leg, or arm, or the face or neck. ?Have paralysis in your face. ?Are very confused. ?Have a drooping eyelid. ?These symptoms may represent a serious problem that is an emergency. Do not wait to see if the symptoms will go away. Get medical help right away. Call your local emergency services (911 in the [place] Do not drive yourself to the hospital. ?Summary ?Lung cancer is an abnormal growth of cancerous cells that forms a mass (malignant tumor) in a lung. ?There are several types of lung cancer. The types are based on the appearance of the tumor cells. The two most common types are non-small cell and small cell. ?The most common cause of lung cancer is smoking tobacco. ?Early symptoms include a lasting cough, possibly with blood, and fatigue, unexplained weight loss, and shortness of breath. ?After diagnosis, treatment depends on the type and stage of your cancer. ?This information is not intended to replace advice given to you by your health care provider. Make sure you discuss any questions you have with your health care provider. ?Document Revised: 06/24/2020 Document Reviewed: 06/24/2020 ?Elsevier Patient Education ? [place]. ? ?

## 2021-04-30 ENCOUNTER — Ambulatory Visit
Admission: RE | Admit: 2021-04-30 | Discharge: 2021-04-30 | Disposition: A | Discharge status: Home / Self Care | Payer: Medicare Other | Source: Ambulatory Visit | Attending: Radiation Oncology | Admitting: Radiation Oncology

## 2021-04-30 DIAGNOSIS — Z510 Encounter for antineoplastic radiation therapy: Secondary | ICD-10-CM | POA: Diagnosis not present

## 2021-05-03 ENCOUNTER — Ambulatory Visit: Payer: Medicare Other | Admitting: Pulmonary Disease

## 2021-05-03 ENCOUNTER — Ambulatory Visit
Admission: RE | Admit: 2021-05-03 | Discharge: 2021-05-03 | Disposition: A | Discharge status: Home / Self Care | Payer: Medicare Other | Source: Ambulatory Visit | Attending: Radiation Oncology | Admitting: Radiation Oncology

## 2021-05-03 ENCOUNTER — Other Ambulatory Visit: Payer: Self-pay | Admitting: Radiation Oncology

## 2021-05-03 ENCOUNTER — Encounter: Payer: Self-pay | Admitting: Radiation Oncology

## 2021-05-03 DIAGNOSIS — R29898 Other symptoms and signs involving the musculoskeletal system: Secondary | ICD-10-CM

## 2021-05-03 DIAGNOSIS — C3431 Malignant neoplasm of lower lobe, right bronchus or lung: Secondary | ICD-10-CM

## 2021-05-03 DIAGNOSIS — Z510 Encounter for antineoplastic radiation therapy: Secondary | ICD-10-CM | POA: Diagnosis not present

## 2021-05-03 DIAGNOSIS — G9520 Unspecified cord compression: Secondary | ICD-10-CM

## 2021-05-03 MED ORDER — DEXAMETHASONE 4 MG PO TABS: ORAL_TABLET | ORAL | 1 refills | Status: DC

## 2021-05-03 NOTE — Progress Notes (Signed)
?  Radiation Oncology         [phone] ?________________________________ ? ?Name: Anna Finley  FXG:[id number] ? ?Date of Service: 05/03/21  DOB: February 11, 1945 ? ? ?Steroid Taper Instructions ? ? ?You currently have a prescription for Dexamethasone 4 mg Tablets.  ? ?Beginning 05/03/21  ?Take a 4 mg tablet twice a day ? ?Beginning 05/10/21: ?Take 1/2 of a tablet (which is 2 mg) twice a day ? ?Beginning 05/17/21: ?Take 1/2 of a tablet (which is 2 mg) once a day ? ?Beginning 05/24/21: ?Take 1/2 of a tablet (which is 2 mg) every other day and stop on 05/31/21. ? ? ?Please call our office if you have any headaches, visual changes, uncontrolled movements, nausea or vomiting.  ? ? ?

## 2021-05-04 ENCOUNTER — Other Ambulatory Visit: Payer: Self-pay | Admitting: Radiation Oncology

## 2021-05-04 ENCOUNTER — Other Ambulatory Visit: Payer: Self-pay

## 2021-05-04 ENCOUNTER — Ambulatory Visit: Payer: Medicare Other

## 2021-05-04 ENCOUNTER — Ambulatory Visit
Admission: RE | Admit: 2021-05-04 | Discharge: 2021-05-04 | Disposition: A | Discharge status: Home / Self Care | Payer: Medicare Other | Source: Ambulatory Visit | Attending: Radiation Oncology | Admitting: Radiation Oncology

## 2021-05-04 DIAGNOSIS — G9520 Unspecified cord compression: Secondary | ICD-10-CM

## 2021-05-04 DIAGNOSIS — C3431 Malignant neoplasm of lower lobe, right bronchus or lung: Secondary | ICD-10-CM

## 2021-05-04 DIAGNOSIS — Z510 Encounter for antineoplastic radiation therapy: Secondary | ICD-10-CM | POA: Diagnosis not present

## 2021-05-04 LAB — RAD ONC ARIA SESSION SUMMARY
Course Elapsed Days: 13
Plan Fractions Treated to Date: 9
Plan Fractions Treated to Date: 9
Plan Prescribed Dose Per Fraction: 3 Gy
Plan Prescribed Dose Per Fraction: 3 Gy
Plan Total Fractions Prescribed: 10
Plan Total Fractions Prescribed: 10
Plan Total Prescribed Dose: 30 Gy
Plan Total Prescribed Dose: 30 Gy
Reference Point Dosage Given to Date: 27 Gy
Reference Point Dosage Given to Date: 27 Gy
Reference Point Session Dosage Given: 3 Gy
Reference Point Session Dosage Given: 3 Gy
Session Number: 9

## 2021-05-04 NOTE — Progress Notes (Signed)
Anna Finley is a pleasant woman with a history of Stage IV NSCLC, adenocarcinoma of the RLL who presented intially with thoracic back pain and cord compression.  Seen in an urgent manner while inpatient, underwent bronchoscopy for her diagnosis , and has been receiving palliative radiotherapy to her chest and T spine. She has been taking Dexamethasone 4 mg TID since her findings, and has taper instructions to begin this week. She finishes her radiation tomorrow.  She met with Dr. Julien Nordmann, and he is awaiting molecular testing to determine systemic therapy options.  She has been significantly constipated and was seen today to discuss options.  We reviewed the rationale for milk of magnesia and MiraLAX versus magnesium citrate which the patient would prefer to try.  Fortunately neurologically she has been able to take a few steps but is quite limited in getting out of the house because of the lack of a walker or wheelchair.  Her family members are having to carry her around the house.  She is still quite unsure of her steps, and feels like she can fall easily.  She reports that she can sense when she needs to void and her weakness has improved.  I am concerned for her safety at home related to her mobility and recommend home health referral for physical therapy evaluation and treatment and locating durable medical equipment that they feel would be best suited for her in her home.  I also think it would be helpful for nursing to be a part of her home health care team for navigating her medication management.  Orders will be placed for urgent evaluation.  Otherwise we will follow-up by phone in 1 month's time and she will continue with Dr. Julien Nordmann for the long-term. ?

## 2021-05-05 ENCOUNTER — Encounter: Payer: Self-pay | Admitting: Radiation Oncology

## 2021-05-05 ENCOUNTER — Encounter: Payer: Self-pay | Admitting: *Deleted

## 2021-05-05 ENCOUNTER — Ambulatory Visit
Admission: RE | Admit: 2021-05-05 | Discharge: 2021-05-05 | Disposition: A | Discharge status: Home / Self Care | Payer: Medicare Other | Source: Ambulatory Visit | Attending: Radiation Oncology | Admitting: Radiation Oncology

## 2021-05-05 ENCOUNTER — Encounter (HOSPITAL_COMMUNITY): Payer: Self-pay | Admitting: Internal Medicine

## 2021-05-05 ENCOUNTER — Other Ambulatory Visit: Payer: Self-pay

## 2021-05-05 ENCOUNTER — Inpatient Hospital Stay (HOSPITAL_BASED_OUTPATIENT_CLINIC_OR_DEPARTMENT_OTHER): Payer: Medicare Other | Admitting: Internal Medicine

## 2021-05-05 VITALS — BP 130/72 | HR 60 | Temp 98.4°F | Resp 18

## 2021-05-05 DIAGNOSIS — Z5112 Encounter for antineoplastic immunotherapy: Secondary | ICD-10-CM | POA: Diagnosis not present

## 2021-05-05 DIAGNOSIS — Z510 Encounter for antineoplastic radiation therapy: Secondary | ICD-10-CM | POA: Diagnosis not present

## 2021-05-05 DIAGNOSIS — C3431 Malignant neoplasm of lower lobe, right bronchus or lung: Secondary | ICD-10-CM

## 2021-05-05 LAB — RAD ONC ARIA SESSION SUMMARY
Course Elapsed Days: 14
Plan Fractions Treated to Date: 10
Plan Fractions Treated to Date: 10
Plan Prescribed Dose Per Fraction: 3 Gy
Plan Prescribed Dose Per Fraction: 3 Gy
Plan Total Fractions Prescribed: 10
Plan Total Fractions Prescribed: 10
Plan Total Prescribed Dose: 30 Gy
Plan Total Prescribed Dose: 30 Gy
Reference Point Dosage Given to Date: 30 Gy
Reference Point Dosage Given to Date: 30 Gy
Reference Point Session Dosage Given: 3 Gy
Reference Point Session Dosage Given: 3 Gy
Session Number: 10

## 2021-05-05 NOTE — Progress Notes (Signed)
?    [place] ?[phone]   [phone] ? ?OFFICE PROGRESS NOTE ? ?[name], MD ?[address] ?[place] [address] ? ?DIAGNOSIS: Stage IV (T3, N2, M1 C) non-small cell lung cancer, adenocarcinoma presented with large right lower lobe lung mass in addition to subcarinal lymphadenopathy as well as bone metastasis diagnosed in April 2023 ? ?PRIOR THERAPY: Palliative radiotherapy to the metastatic T3 bone lesion under the care of Dr. Lisbeth Renshaw ? ?CURRENT THERAPY: Systemic treatment with immunotherapy with single agent Keytruda 200 Mg IV every 3 weeks.  First dose expected to start May 17, 2021 if the pending molecular studies showed no actionable mutations. ? ?INTERVAL HISTORY: ?Anna Finley 76 y.o. female returns to the clinic today for follow-up visit.  The patient is feeling fine today with no concerning complaints except for the shortness of breath with exertion and mild chest pain.  She also complains of increasing fatigue and weakness.  She is currently undergoing palliative radiotherapy to the metastatic T3 bone lesion under the care of Dr. Lisbeth Renshaw. The patient denied having any fever or chills.  She has no nausea, vomiting, diarrhea or constipation.  She has no headache or visual changes.  Her tissue biopsy was sent to [facility] for molecular studies and PD-L1 expression.  The molecular studies are still pending but the PD-L1 expression returns back as 50%. ? ?MEDICAL HISTORY: ?Past Medical History:  ?Diagnosis Date  ? COPD (chronic obstructive pulmonary disease) with chronic bronchitis ([place])   ? HTN (hypertension)   ? Pneumothorax   ? ? ?ALLERGIES:  has No Known Allergies. ? ?MEDICATIONS:  ?Current Outpatient Medications  ?Medication Sig Dispense Refill  ? acetaminophen (TYLENOL) 500 MG tablet Take 500 mg by mouth every 6 (six) hours as needed for moderate pain or headache.    ? apixaban (ELIQUIS) 5 MG TABS tablet Take 1 tablet (5 mg total) by mouth  2 (two) times daily. Follow up with Dr. Julien Nordmann for additional refills 60 tablet 0  ? atenolol (TENORMIN) 50 MG tablet Take 1 tablet (50 mg total) by mouth daily. 30 tablet 0  ? atorvastatin (LIPITOR) 40 MG tablet Take 40 mg by mouth daily.    ? dexamethasone (DECADRON) 4 MG tablet See Taper instruction sheet 30 tablet 1  ? gabapentin (NEURONTIN) 300 MG capsule Take 300 mg by mouth 2 (two) times daily.    ? metFORMIN (GLUCOPHAGE) 500 MG tablet Take 500 mg by mouth daily.    ? montelukast (SINGULAIR) 10 MG tablet Take 10 mg by mouth at bedtime.    ? polyethylene glycol (MIRALAX / GLYCOLAX) 17 g packet Take 17 g by mouth daily as needed for mild constipation. Meds to bed, thanks 14 each 0  ? promethazine-dextromethorphan (PROMETHAZINE-DM) 6.25-15 MG/5ML syrup Take 5 mLs by mouth 4 (four) times daily as needed for cough.    ? WIXELA INHUB 250-50 MCG/ACT AEPB 1 puff 2 (two) times daily.    ? ?No current facility-administered medications for this visit.  ? ? ?SURGICAL HISTORY:  ?Past Surgical History:  ?Procedure Laterality Date  ? BRONCHIAL NEEDLE ASPIRATION BIOPSY  04/20/2021  ? Procedure: BRONCHIAL NEEDLE ASPIRATION BIOPSIES;  Surgeon: Candee Furbish, MD;  Location: [facility];  Service: Endoscopy;;  ? BRONCHIAL WASHINGS  04/20/2021  ? Procedure: BRONCHIAL WASHINGS;  Surgeon: Candee Furbish, MD;  Location: [facility];  Service: Endoscopy;;  ? ENDOBRONCHIAL ULTRASOUND N/A 04/20/2021  ? Procedure: ENDOBRONCHIAL ULTRASOUND;  Surgeon: Candee Furbish, MD;  Location: [facility];  Service: Endoscopy;  Laterality: N/A;  ? HIP ARTHROPLASTY    ? VIDEO BRONCHOSCOPY Left 04/20/2021  ? Procedure: VIDEO BRONCHOSCOPY WITH FLUORO;  Surgeon: Candee Furbish, MD;  Location: [facility];  Service: Endoscopy;  Laterality: Left;  EBUS  ? ? ?REVIEW OF SYSTEMS:  Constitutional: positive for anorexia, fatigue, and weight loss ?Eyes: negative ?Ears, nose, mouth, throat, and face: negative ?Respiratory: positive for cough and dyspnea on  exertion ?Cardiovascular: negative ?Gastrointestinal: negative ?Genitourinary:negative ?Integument/breast: negative ?Hematologic/lymphatic: negative ?Musculoskeletal:positive for back pain and bone pain ?Neurological: negative ?Behavioral/Psych: negative ?Endocrine: negative ?Allergic/Immunologic: negative  ? ?PHYSICAL EXAMINATION: General appearance: alert, cooperative, appears older than stated age, cachectic, and fatigued ?Head: Normocephalic, without obvious abnormality, atraumatic ?Neck: no adenopathy, no JVD, supple, symmetrical, trachea midline, and thyroid not enlarged, symmetric, no tenderness/mass/nodules ?Lymph nodes: Cervical, supraclavicular, and axillary nodes normal. ?Resp: clear to auscultation bilaterally ?Back: symmetric, no curvature. ROM normal. No CVA tenderness. ?Cardio: regular rate and rhythm, S1, S2 normal, no murmur, click, rub or gallop ?GI: soft, non-tender; bowel sounds normal; no masses,  no organomegaly ?Extremities: extremities normal, atraumatic, no cyanosis or edema ?Neurologic: Alert and oriented X 3, normal strength and tone. Normal symmetric reflexes. Normal coordination and gait ? ?ECOG PERFORMANCE STATUS: 2 - Symptomatic, <50% confined to bed ? ?Blood pressure 130/72, pulse 60, temperature 98.4 ?F (36.9 ?C), temperature source Oral, resp. rate 18, SpO2 94 %. ? ?LABORATORY DATA: ?Lab Results  ?Component Value Date  ? WBC 16.3 (H) 04/28/2021  ? HGB 11.4 (L) 04/28/2021  ? HCT 34.1 (L) 04/28/2021  ? MCV 84.6 04/28/2021  ? PLT 553 (H) 04/28/2021  ? ? ?  Chemistry   ?   ?Component Value Date/Time  ? NA 127 (L) 04/28/2021 1554  ? K 4.5 04/28/2021 1554  ? CL 93 (L) 04/28/2021 1554  ? CO2 24 04/28/2021 1554  ? BUN 30 (H) 04/28/2021 1554  ? CREATININE 0.42 (L) 04/28/2021 1554  ?    ?Component Value Date/Time  ? CALCIUM 9.0 04/28/2021 1554  ? ALKPHOS 158 (H) 04/28/2021 1554  ? AST 17 04/28/2021 1554  ? ALT 21 04/28/2021 1554  ? BILITOT 0.4 04/28/2021 1554  ?  ? ? ? ?RADIOGRAPHIC  STUDIES: ?CT Head Wo Contrast ? ?Result Date: 04/19/2021 ?CLINICAL DATA:  Ataxia.  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 EXAM: CT HEAD WITHOUT CONTRAST TECHNIQUE: Contiguous axial images were obtained from the base of the skull through the vertex without intravenous contrast. RADIATION DOSE REDUCTION: This exam was performed according to the departmental dose-optimization program which includes automated exposure control, adjustment of the mA and/or kV according to patient size and/or use of iterative reconstruction technique. COMPARISON:  04/16/2011 FINDINGS: Brain: Diffuse cerebral atrophy. Ventricular dilatation consistent with central atrophy. Low-attenuation changes in the deep white matter consistent with small vessel ischemia. No abnormal extra-axial fluid collections. No mass effect or midline shift. Gray-white matter junctions are distinct. Basal cisterns are not effaced. No acute intracranial hemorrhage. No focal lesions are identified that might indicate metastatic disease. However, MRI is more sensitive for evaluation of metastatic lesions. Vascular: Intracranial arterial vascular calcifications. Skull: Calvarium appears intact. No acute depressed skull fractures. No focal bone lesions. Sinuses/Orbits: Paranasal sinuses demonstrate opacification of the sphenoid sinuses. Mastoid air cells are clear. 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 IMPRESSION: 1. No acute intracranial abnormalities. Chronic atrophy and small vessel ischemic changes. 2. No focal intracranial lesions are demonstrated. However, MRI is more sensitive for evaluation of  intracranial metastatic disease. Electronically Signed   By: Lucienne Capers M.D.   On: 04/19/2021 00:04  ? ?CT ANGIO NECK W OR WO CONTRAST ? ?Result Date: 04/21/2021 ?CLINICAL DATA:  abnormal MRI, concern for jugular vein thrombosis vs right carotid tumor or malignant LN. EXAM: CT ANGIOGRAPHY NECK TECHNIQUE: Multidetector CT imaging of the neck was  performed using the standard protocol during bolus administration of intravenous contrast. Multiplanar CT image reconstructions and MIPs were obtained to evaluate the vascular anatomy. Carotid stenosis measuremen

## 2021-05-05 NOTE — Progress Notes (Signed)
Oncology Nurse Navigator Documentation ? ? ?  05/05/2021  ?  3:00 PM 05/05/2021  ? 12:00 PM 05/05/2021  ? 10:00 AM 04/29/2021  ? 11:00 AM 04/29/2021  ?  9:00 AM  ?Oncology Nurse Navigator Flowsheets  ?Diagnosis Status   Confirmed Diagnosis Complete    ?Planned Course of Treatment   Radiation;Targeted Therapy    ?Navigator Follow Up Reason: Appointment Review;Molecular Testing  Follow-up Appointment    ?Navigation Complete Date: 05/12/2021  05/05/2021    ?Navigator [facility]  ?Navigator Encounter Type Clinic/MDC;Follow-up Appt Molecular Studies Telephone Other: Pathology Review  ?Telephone   Outgoing Call    ?Patient Visit Type    Other Other  ?Treatment Phase Treatment Pre-Tx/Tx Discussion Pre-Tx/Tx Discussion Pre-Tx/Tx Discussion Pre-Tx/Tx Discussion  ?Barriers/Navigation Needs Education;Coordination of Care Coordination of Care Coordination of Care;Education Coordination of Care Coordination of Care  ?Education Preparing for Upcoming Surgery/ Treatment  Other    ?Interventions Referrals;Psycho-Social Support;Education/spoke with Ms. Hurd today.  Her plan of care is IO therapy, information provided on her AVS.  Coordination of Care Coordination of Care;Education Coordination of Care Coordination of Care  ?Acuity Level 2-Minimal Needs (1-2 Barriers Identified) Level 2-Minimal Needs (1-2 Barriers Identified) Level 2-Minimal Needs (1-2 Barriers Identified) Level 2-Minimal Needs (1-2 Barriers Identified) Level 2-Minimal Needs (1-2 Barriers Identified)  ?Referrals Social Work      ?Coordination of Care  Pathology Appts Other Pathology  ?Education Method Verbal;Other  Verbal    ?Time Spent with Patient 30 30 30 30 30   ?  ?

## 2021-05-05 NOTE — Progress Notes (Signed)
Oncology Nurse Navigator Documentation ? ? ?  05/05/2021  ? 10:00 AM 04/29/2021  ? 11:00 AM 04/29/2021  ?  9:00 AM  ?Oncology Nurse Navigator Flowsheets  ?Diagnosis Status Confirmed Diagnosis Complete    ?Planned Course of Treatment Radiation;Targeted Therapy    ?Navigator Follow Up Reason: Follow-up Appointment    ?Navigation Complete Date: 05/05/2021    ?Navigator Location [facility]  ?Navigator Encounter Type Telephone Other: Pathology Review  ?Telephone Outgoing Call    ?Patient Visit Type  Other Other  ?Treatment Phase Pre-Tx/Tx Discussion Pre-Tx/Tx Discussion Pre-Tx/Tx Discussion  ?Barriers/Navigation Needs Coordination of Care;Education Coordination of Care Coordination of Care  ?Education Other    ?Interventions Coordination of Care;Education/I received a message from Dr. Julien Nordmann to schedule Ms. Grunewald for follow up. I called patient's daughter and explained.  I scheduled patient to be seen today with Dr. Julien Nordmann. She verbalized understanding.  Coordination of Care Coordination of Care  ?Acuity Level 2-Minimal Needs (1-2 Barriers Identified) Level 2-Minimal Needs (1-2 Barriers Identified) Level 2-Minimal Needs (1-2 Barriers Identified)  ?Coordination of Care Appts Other Pathology  ?Education Method Verbal    ?Time Spent with Patient 30 30 30   ?  ?

## 2021-05-05 NOTE — Progress Notes (Signed)
Oncology Nurse Navigator Documentation ? ? ?  05/05/2021  ? 12:00 PM 05/05/2021  ? 10:00 AM 04/29/2021  ? 11:00 AM 04/29/2021  ?  9:00 AM  ?Oncology Nurse Navigator Flowsheets  ?Diagnosis Status  Confirmed Diagnosis Complete    ?Planned Course of Treatment  Radiation;Targeted Therapy    ?Navigator Follow Up Reason:  Follow-up Appointment    ?Navigation Complete Date:  05/05/2021    ?Navigator Location [facility]  ?Navigator Encounter Type Molecular Studies Telephone Other: Pathology Review  ?Telephone  Outgoing Call    ?Patient Visit Type   Other Other  ?Treatment Phase Pre-Tx/Tx Discussion Pre-Tx/Tx Discussion Pre-Tx/Tx Discussion Pre-Tx/Tx Discussion  ?Barriers/Navigation Needs Coordination of Care Coordination of Care;Education Coordination of Care Coordination of Care  ?Education  Other    ?Interventions Coordination of Care Coordination of Care;Education Coordination of Care Coordination of Care  ?Acuity Level 2-Minimal Needs (1-2 Barriers Identified) Level 2-Minimal Needs (1-2 Barriers Identified) Level 2-Minimal Needs (1-2 Barriers Identified) Level 2-Minimal Needs (1-2 Barriers Identified)  ?Coordination of Care Pathology/I followed up on when Ms. Steinberger's molecular test will be completed.  According to the [facility] patient portal, it will be completed on 4/25.  I updated Dr. Julien Nordmann.  Appts Other Pathology  ?Education Method  Verbal    ?Time Spent with Patient 30 30 30 30   ?  ?

## 2021-05-05 NOTE — Patient Instructions (Signed)
Pembrolizumab injection ?What is this medication? ?PEMBROLIZUMAB (pem broe liz ue mab) is a monoclonal antibody. It is used to treat certain types of cancer. ?This medicine may be used for other purposes; ask your health care provider or pharmacist if you have questions. ?COMMON BRAND NAME(S): Keytruda ?What should I tell my care team before I take this medication? ?They need to know if you have any of these conditions: ?autoimmune diseases like Crohn's disease, ulcerative colitis, or lupus ?have had or planning to have an allogeneic stem cell transplant (uses someone else's stem cells) ?history of organ transplant ?history of chest radiation ?nervous system problems like myasthenia gravis or Guillain-Barre syndrome ?an unusual or allergic reaction to pembrolizumab, other medicines, foods, dyes, or preservatives ?pregnant or trying to get pregnant ?breast-feeding ?How should I use this medication? ?This medicine is for infusion into a vein. It is given by a health care professional in a hospital or clinic setting. ?A special MedGuide will be given to you before each treatment. Be sure to read this information carefully each time. ?Talk to your pediatrician regarding the use of this medicine in children. While this drug may be prescribed for children as young as 6 months for selected conditions, precautions do apply. ?Overdosage: If you think you have taken too much of this medicine contact a poison control center or emergency room at once. ?NOTE: This medicine is only for you. Do not share this medicine with others. ?What if I miss a dose? ?It is important not to miss your dose. Call your doctor or health care professional if you are unable to keep an appointment. ?What may interact with this medication? ?Interactions have not been studied. ?This list may not describe all possible interactions. Give your health care provider a list of all the medicines, herbs, non-prescription drugs, or dietary supplements you use.  Also tell them if you smoke, drink alcohol, or use illegal drugs. Some items may interact with your medicine. ?What should I watch for while using this medication? ?Your condition will be monitored carefully while you are receiving this medicine. ?You may need blood work done while you are taking this medicine. ?Do not become pregnant while taking this medicine or for 4 months after stopping it. Women should inform their doctor if they wish to become pregnant or think they might be pregnant. There is a potential for serious side effects to an unborn child. Talk to your health care professional or pharmacist for more information. Do not breast-feed an infant while taking this medicine or for 4 months after the last dose. ?What side effects may I notice from receiving this medication? ?Side effects that you should report to your doctor or health care professional as soon as possible: ?allergic reactions like skin rash, itching or hives, swelling of the face, lips, or tongue ?bloody or black, tarry ?breathing problems ?changes in vision ?chest pain ?chills ?confusion ?constipation ?cough ?diarrhea ?dizziness or feeling faint or lightheaded ?fast or irregular heartbeat ?fever ?flushing ?joint pain ?low blood counts - this medicine may decrease the number of white blood cells, red blood cells and platelets. You may be at increased risk for infections and bleeding. ?muscle pain ?muscle weakness ?pain, tingling, numbness in the hands or feet ?persistent headache ?redness, blistering, peeling or loosening of the skin, including inside the mouth ?signs and symptoms of high blood sugar such as dizziness; dry mouth; dry skin; fruity breath; nausea; stomach pain; increased hunger or thirst; increased urination ?signs and symptoms of kidney injury like trouble  passing urine or change in the amount of urine ?signs and symptoms of liver injury like dark urine, light-colored stools, loss of appetite, nausea, right upper belly pain,  yellowing of the eyes or skin ?sweating ?swollen lymph nodes ?weight loss ?Side effects that usually do not require medical attention (report to your doctor or health care professional if they continue or are bothersome): ?decreased appetite ?hair loss ?tiredness ?This list may not describe all possible side effects. Call your doctor for medical advice about side effects. You may report side effects to FDA at 1-800-FDA-1088. ?Where should I keep my medication? ?This drug is given in a hospital or clinic and will not be stored at home. ?NOTE: This sheet is a summary. It may not cover all possible information. If you have questions about this medicine, talk to your doctor, pharmacist, or health care provider. ?? 2023 Elsevier/Gold Standard (2020-12-04 00:00:00) ? ?

## 2021-05-06 ENCOUNTER — Encounter: Payer: Self-pay | Admitting: Licensed Clinical Social Worker

## 2021-05-06 DIAGNOSIS — C3431 Malignant neoplasm of lower lobe, right bronchus or lung: Secondary | ICD-10-CM

## 2021-05-06 NOTE — Progress Notes (Signed)
[place] [facility] CSW Progress Note ? ?Clinical Social Worker contacted caregiver by phone to discuss transportation concerns.  Pt's daughter Annett Boxwell [phone]) informed writer pt had once been enrolled in [place] transport, but had stopped using the benefit.  CSW provided pt's daughter with the contact number to book pt's transport through Medicaid if the benefit is still active.  Daughter also instructed to inform Medicaid at time of booking that pt's physical needs have changed and she will now require assistance.  If additional documentation is needed to verify pt's medical condition the medical team will assist in providing requested documentation.  Daughter also given contact details for CSW should any additional needs arise.  CSW to remain available as appropriate.   ? ? ? ?Henriette Combs , LCSW ?

## 2021-05-07 ENCOUNTER — Encounter (HOSPITAL_COMMUNITY): Payer: Self-pay | Admitting: Internal Medicine

## 2021-05-10 ENCOUNTER — Other Ambulatory Visit: Payer: Self-pay | Admitting: Internal Medicine

## 2021-05-10 ENCOUNTER — Telehealth (HOSPITAL_BASED_OUTPATIENT_CLINIC_OR_DEPARTMENT_OTHER): Payer: Self-pay

## 2021-05-10 ENCOUNTER — Telehealth: Payer: Self-pay | Admitting: Internal Medicine

## 2021-05-10 ENCOUNTER — Other Ambulatory Visit (HOSPITAL_BASED_OUTPATIENT_CLINIC_OR_DEPARTMENT_OTHER): Payer: Self-pay

## 2021-05-10 NOTE — Telephone Encounter (Signed)
Scheduled appointment per inbasket message. Patient aware.   ?

## 2021-05-10 NOTE — Telephone Encounter (Signed)
Transitions of Care Pharmacy  ? ?Call attempted for a pharmacy transitions of care follow-up.  ? ?Call attempt #1. Will follow-up in 2-3 days.  ? ?Darcus Austin, PharmD ?Clinical Pharmacist ?[facility] ?05/10/2021 11:58 AM ?  ?

## 2021-05-11 ENCOUNTER — Other Ambulatory Visit (HOSPITAL_BASED_OUTPATIENT_CLINIC_OR_DEPARTMENT_OTHER): Payer: Self-pay

## 2021-05-11 ENCOUNTER — Telehealth (HOSPITAL_BASED_OUTPATIENT_CLINIC_OR_DEPARTMENT_OTHER): Payer: Self-pay

## 2021-05-11 NOTE — Telephone Encounter (Signed)
Transitions of Care Pharmacy  ? ?Call attempted for a pharmacy transitions of care follow-up.  ? ?Call attempt #2. Will follow-up in 2-3 days.  ? ?Darcus Austin, PharmD ?Clinical Pharmacist ?[facility] [facility] ?05/11/2021 9:28 AM ? ?  ?

## 2021-05-12 ENCOUNTER — Other Ambulatory Visit (HOSPITAL_COMMUNITY): Payer: Self-pay

## 2021-05-12 ENCOUNTER — Other Ambulatory Visit: Payer: Medicare Other

## 2021-05-12 ENCOUNTER — Telehealth (HOSPITAL_COMMUNITY): Payer: Self-pay

## 2021-05-12 NOTE — Telephone Encounter (Signed)
Transitions of Care Pharmacy  ? ?Call attempted for a pharmacy transitions of care follow-up.   ? ?Call attempt #3. Final attempt ? ?Darcus Austin, PharmD ?Clinical Pharmacist ?[place] [facility] ?05/12/2021 11:39 AM ? ?  ?

## 2021-05-14 ENCOUNTER — Encounter: Payer: Self-pay | Admitting: *Deleted

## 2021-05-14 NOTE — Progress Notes (Signed)
Oncology Nurse Navigator Documentation ? ? ?  05/14/2021  ? 12:00 PM 05/05/2021  ?  3:00 PM 05/05/2021  ? 12:00 PM 05/05/2021  ? 10:00 AM 04/29/2021  ? 11:00 AM 04/29/2021  ?  9:00 AM  ?Oncology Nurse Navigator Flowsheets  ?Diagnosis Status    Confirmed Diagnosis Complete    ?Planned Course of Treatment    Radiation;Targeted Therapy    ?Navigator Follow Up Date: 05/17/2021       ?Navigator Follow Up Reason: Follow-up Appointment Appointment Review;Molecular Testing  Follow-up Appointment    ?Navigation Complete Date:  05/12/2021  05/05/2021    ?Navigator Location [facility]  ?Navigator Encounter Type Telephone Clinic/MDC;Follow-up Appt Molecular Studies Telephone Other: Pathology Review  ?Telephone Outgoing Call   [place] Call    ?Patient Visit Type     Other Other  ?Treatment Phase  Treatment Pre-Tx/Tx Discussion Pre-Tx/Tx Discussion Pre-Tx/Tx Discussion Pre-Tx/Tx Discussion  ?Barriers/Navigation Needs Coordination of Care;Education/I called Anna Finley today to reminder her of her appt with Dr. Julien Nordmann on 5/1.  She verbalized understanding of appt.  Education;Coordination of Care Coordination of Care Coordination of Care;Education Coordination of Care Coordination of Care  ?Education Other Preparing for Upcoming Surgery/ Treatment  Other    ?Interventions Coordination of Care;Education Referrals;Psycho-Social Support;Education Coordination of Care Coordination of Care;Education Coordination of Care Coordination of Care  ?Acuity Level 2-Minimal Needs (1-2 Barriers Identified) Level 2-Minimal Needs (1-2 Barriers Identified) Level 2-Minimal Needs (1-2 Barriers Identified) Level 2-Minimal Needs (1-2 Barriers Identified) Level 2-Minimal Needs (1-2 Barriers Identified) Level 2-Minimal Needs (1-2 Barriers Identified)  ?Referrals  Social Work      ?Coordination of Care Other  Pathology Appts Other Pathology  ?Education Method  Verbal;Other   Verbal    ?Time Spent with Patient 30 30 30 30 30 30   ?  ?

## 2021-05-17 ENCOUNTER — Ambulatory Visit: Payer: Medicare Other | Admitting: Internal Medicine

## 2021-05-17 ENCOUNTER — Other Ambulatory Visit: Payer: Medicare Other

## 2021-05-17 ENCOUNTER — Encounter: Payer: Self-pay | Admitting: *Deleted

## 2021-05-17 NOTE — Progress Notes (Signed)
Oncology Nurse Navigator Documentation ? ? ?  05/17/2021  ? 12:00 PM 05/14/2021  ? 12:00 PM 05/05/2021  ?  3:00 PM 05/05/2021  ? 12:00 PM 05/05/2021  ? 10:00 AM 04/29/2021  ? 11:00 AM 04/29/2021  ?  9:00 AM  ?Oncology Nurse Navigator Flowsheets  ?Diagnosis Status     Confirmed Diagnosis Complete    ?Planned Course of Treatment     Radiation;Targeted Therapy    ?Navigator Follow Up Date: 05/18/2021 05/17/2021       ?Navigator Follow Up Reason: Follow-up Appointment Follow-up Appointment Appointment Review;Molecular Testing  Follow-up Appointment    ?Navigation Complete Date:   05/12/2021  05/05/2021    ?Navigator Location [facility] [place] [facility]  ?Navigator Encounter Type Telephone Telephone Clinic/MDC;Follow-up Appt Molecular Studies Telephone Other: Pathology Review  ?Telephone Outgoing Call [place] Call   [place] Call    ?Patient Visit Type      Other Other  ?Treatment Phase   Treatment Pre-Tx/Tx Discussion Pre-Tx/Tx Discussion Pre-Tx/Tx Discussion Pre-Tx/Tx Discussion  ?Barriers/Navigation Needs Coordination of Care/Ms. Moman was a no show to her appt today. I called her to schedule again with Dr. Julien Nordmann.  I spoke with the daughter and she verbalized understanding of appt.  Coordination of Care;Education Education;Coordination of Care Coordination of Care Coordination of Care;Education Coordination of Care Coordination of Care  ?Education  Other Preparing for Upcoming Surgery/ Treatment  Other    ?Interventions Coordination of Care Coordination of Care;Education Referrals;Psycho-Social Support;Education Coordination of Care Coordination of Care;Education Coordination of Care Coordination of Care  ?Acuity Level 2-Minimal Needs (1-2 Barriers Identified) Level 2-Minimal Needs (1-2 Barriers Identified) Level 2-Minimal Needs (1-2 Barriers Identified) Level 2-Minimal Needs (1-2 Barriers Identified) Level 2-Minimal Needs (1-2 Barriers  Identified) Level 2-Minimal Needs (1-2 Barriers Identified) Level 2-Minimal Needs (1-2 Barriers Identified)  ?Referrals   Social Work      ?Coordination of Care Appts Other  Pathology Appts Other Pathology  ?Education Method   Verbal;Other  Verbal    ?Time Spent with Patient 30 30 30 30 30 30 30   ?  ?

## 2021-05-18 ENCOUNTER — Telehealth: Payer: Self-pay

## 2021-05-18 ENCOUNTER — Other Ambulatory Visit (HOSPITAL_COMMUNITY): Payer: Self-pay

## 2021-05-18 ENCOUNTER — Encounter: Payer: Self-pay | Admitting: Internal Medicine

## 2021-05-18 ENCOUNTER — Inpatient Hospital Stay: Payer: Medicare Other

## 2021-05-18 ENCOUNTER — Inpatient Hospital Stay: Payer: Medicare Other | Attending: Internal Medicine | Admitting: Internal Medicine

## 2021-05-18 ENCOUNTER — Telehealth: Payer: Self-pay | Admitting: Pharmacist

## 2021-05-18 ENCOUNTER — Other Ambulatory Visit: Payer: Self-pay

## 2021-05-18 VITALS — BP 123/62 | HR 77 | Temp 96.7°F | Resp 18 | Ht 65.0 in | Wt 99.2 lb

## 2021-05-18 DIAGNOSIS — I669 Occlusion and stenosis of unspecified cerebral artery: Secondary | ICD-10-CM | POA: Diagnosis not present

## 2021-05-18 DIAGNOSIS — E876 Hypokalemia: Secondary | ICD-10-CM | POA: Diagnosis not present

## 2021-05-18 DIAGNOSIS — C3431 Malignant neoplasm of lower lobe, right bronchus or lung: Secondary | ICD-10-CM | POA: Diagnosis not present

## 2021-05-18 DIAGNOSIS — C7971 Secondary malignant neoplasm of right adrenal gland: Secondary | ICD-10-CM | POA: Diagnosis not present

## 2021-05-18 DIAGNOSIS — C7951 Secondary malignant neoplasm of bone: Secondary | ICD-10-CM | POA: Diagnosis not present

## 2021-05-18 DIAGNOSIS — Z79899 Other long term (current) drug therapy: Secondary | ICD-10-CM | POA: Insufficient documentation

## 2021-05-18 DIAGNOSIS — Z7901 Long term (current) use of anticoagulants: Secondary | ICD-10-CM | POA: Insufficient documentation

## 2021-05-18 LAB — CBC WITH DIFFERENTIAL (CANCER CENTER ONLY)
Abs Immature Granulocytes: 0.03 10*3/uL (ref 0.00–0.07)
Basophils Absolute: 0 10*3/uL (ref 0.0–0.1)
Basophils Relative: 0 %
Eosinophils Absolute: 0 10*3/uL (ref 0.0–0.5)
Eosinophils Relative: 0 %
HCT: 33.8 % — ABNORMAL LOW (ref 36.0–46.0)
Hemoglobin: 11 g/dL — ABNORMAL LOW (ref 12.0–15.0)
Immature Granulocytes: 0 %
Lymphocytes Relative: 4 %
Lymphs Abs: 0.4 10*3/uL — ABNORMAL LOW (ref 0.7–4.0)
MCH: 28.8 pg (ref 26.0–34.0)
MCHC: 32.5 g/dL (ref 30.0–36.0)
MCV: 88.5 fL (ref 80.0–100.0)
Monocytes Absolute: 0.3 10*3/uL (ref 0.1–1.0)
Monocytes Relative: 3 %
Neutro Abs: 8.5 10*3/uL — ABNORMAL HIGH (ref 1.7–7.7)
Neutrophils Relative %: 93 %
Platelet Count: 289 10*3/uL (ref 150–400)
RBC: 3.82 MIL/uL — ABNORMAL LOW (ref 3.87–5.11)
RDW: 16.8 % — ABNORMAL HIGH (ref 11.5–15.5)
WBC Count: 9.2 10*3/uL (ref 4.0–10.5)
nRBC: 0 % (ref 0.0–0.2)

## 2021-05-18 LAB — CMP (CANCER CENTER ONLY)
ALT: 12 U/L (ref 0–44)
AST: 11 U/L — ABNORMAL LOW (ref 15–41)
Albumin: 3.2 g/dL — ABNORMAL LOW (ref 3.5–5.0)
Alkaline Phosphatase: 138 U/L — ABNORMAL HIGH (ref 38–126)
Anion gap: 6 (ref 5–15)
BUN: 12 mg/dL (ref 8–23)
CO2: 29 mmol/L (ref 22–32)
Calcium: 8.9 mg/dL (ref 8.9–10.3)
Chloride: 101 mmol/L (ref 98–111)
Creatinine: 0.72 mg/dL (ref 0.44–1.00)
GFR, Estimated: >60 mL/min (ref >60)
Glucose, Bld: 111 mg/dL — ABNORMAL HIGH (ref 70–99)
Potassium: 4.3 mmol/L (ref 3.5–5.1)
Sodium: 136 mmol/L (ref 135–145)
Total Bilirubin: 0.4 mg/dL (ref 0.3–1.2)
Total Protein: 6.1 g/dL — ABNORMAL LOW (ref 6.5–8.1)

## 2021-05-18 LAB — TSH: TSH: 1.002 u[IU]/mL (ref 0.350–4.500)

## 2021-05-18 MED ORDER — OSIMERTINIB MESYLATE 80 MG PO TABS
80.0000 mg | ORAL_TABLET | Freq: Every day | ORAL | 3 refills | Status: DC
  Filled 2021-05-18 – 2021-05-19 (×2): qty 30, 30d supply, fill #0
  Filled 2021-06-09: qty 30, 30d supply, fill #1
  Filled 2021-07-07: qty 30, 30d supply, fill #2
  Filled 2021-08-06: qty 30, 30d supply, fill #3

## 2021-05-18 NOTE — Progress Notes (Signed)
?    [place] ?[phone]   [phone] ? ?OFFICE PROGRESS NOTE ? ?[name], MD ?[address] ?[place] [address] ? ?DIAGNOSIS: Stage IV (T3, N2, M1 C) non-small cell lung cancer, adenocarcinoma presented with large right lower lobe lung mass in addition to subcarinal lymphadenopathy as well as bone metastasis diagnosed in April 2023 ? ?Molecular studies by foundation 1 showed ?EGFR T790M, Exon 19 deletion ([id number]) ?AKT2- Aplification-equivocal ?ARID1A- [id number]*1 ?RICTOR- amplification-equivocal ? ?PDL1 Expression 50% ? ?PRIOR THERAPY: Palliative radiotherapy to the metastatic T3 bone lesion under the care of Dr. MLisbeth Renshaw? ?CURRENT THERAPY: Tagrisso 80 mg p.o. daily.  Expected to start in the next few days. ? ? ?INTERVAL HISTORY: ?Anna Finley 76y.o. female returns to the clinic today for follow-up visit accompanied by her daughter DLouretta Shorten  The patient continues to complain of fatigue and weakness in the lower extremities.  She also has good appetite but unable to gain a lot of weight.  She continues to have rattling in her chest with pain with deep breathing.  She has no current nausea, vomiting, diarrhea or constipation.  She has no headache or visual changes.  She denied having any hemoptysis.  Her molecular studies done by [facility] 1 showed positive EGFR deletion 19 mutation in addition to T790M which should be responsive to treatment with Tagrisso.  The patient is here today for evaluation and discussion of her treatment options based on the new findings. ? ?MEDICAL HISTORY: ?Past Medical History:  ?Diagnosis Date  ? COPD (chronic obstructive pulmonary disease) [place]   ? HTN (hypertension)   ? Pneumothorax   ? ? ?ALLERGIES:  has No Known Allergies. ? ?MEDICATIONS:  ?Current Outpatient Medications  ?Medication Sig Dispense Refill  ? acetaminophen (TYLENOL) 500 MG tablet Take 500 mg by mouth every 6 (six)  hours as needed for moderate pain or headache.    ? apixaban (ELIQUIS) 5 MG TABS tablet Take 1 tablet (5 mg total) by mouth 2 (two) times daily. Follow up with Dr. MJulien Nordmannfor additional refills 60 tablet 0  ? atenolol (TENORMIN) 50 MG tablet Take 1 tablet (50 mg total) by mouth daily. 30 tablet 0  ? atenolol-chlorthalidone (TENORETIC) 50-25 MG tablet Take 1 tablet by mouth daily.    ? atorvastatin (LIPITOR) 40 MG tablet Take 40 mg by mouth daily.    ? dexamethasone (DECADRON) 4 MG tablet See Taper instruction sheet 30 tablet 1  ? gabapentin (NEURONTIN) 300 MG capsule Take 300 mg by mouth 2 (two) times daily.    ? metFORMIN (GLUCOPHAGE) 500 MG tablet Take 500 mg by mouth daily.    ? montelukast (SINGULAIR) 10 MG tablet Take 10 mg by mouth at bedtime.    ? polyethylene glycol (MIRALAX / GLYCOLAX) 17 g packet Take 17 g by mouth daily as needed for mild constipation. Meds to bed, thanks 14 each 0  ? promethazine-dextromethorphan (PROMETHAZINE-DM) 6.25-15 MG/5ML syrup Take 5 mLs by mouth 4 (four) times daily as needed for cough.    ? WIXELA INHUB 250-50 MCG/ACT AEPB 1 puff 2 (two) times daily.    ? ?No current facility-administered medications for this visit.  ? ? ?SURGICAL HISTORY:  ?Past Surgical History:  ?Procedure Laterality Date  ? BRONCHIAL NEEDLE ASPIRATION BIOPSY  04/20/2021  ? Procedure: BRONCHIAL NEEDLE ASPIRATION BIOPSIES;  Surgeon: SCandee Furbish MD;  Location: [facility];  Service: Endoscopy;;  ? BRONCHIAL WASHINGS  04/20/2021  ? Procedure:  BRONCHIAL WASHINGS;  Surgeon: Candee Furbish, MD;  Location: [facility];  Service: Endoscopy;;  ? ENDOBRONCHIAL ULTRASOUND N/A 04/20/2021  ? Procedure: ENDOBRONCHIAL ULTRASOUND;  Surgeon: Candee Furbish, MD;  Location: [facility];  Service: Endoscopy;  Laterality: N/A;  ? HIP ARTHROPLASTY    ? VIDEO BRONCHOSCOPY Left 04/20/2021  ? Procedure: VIDEO BRONCHOSCOPY WITH FLUORO;  Surgeon: Candee Furbish, MD;  Location: [facility];  Service: Endoscopy;  Laterality: Left;   EBUS  ? ? ?REVIEW OF SYSTEMS:  Constitutional: positive for anorexia, fatigue, and weight loss ?Eyes: negative ?Ears, nose, mouth, throat, and face: negative ?Respiratory: positive for cough, dyspnea on exertion, and pleurisy/chest pain ?Cardiovascular: negative ?Gastrointestinal: negative ?Genitourinary:negative ?Integument/breast: negative ?Hematologic/lymphatic: negative ?Musculoskeletal:positive for back pain, bone pain, and muscle weakness ?Neurological: negative ?Behavioral/Psych: negative ?Endocrine: negative ?Allergic/Immunologic: negative  ? ?PHYSICAL EXAMINATION: General appearance: alert, cooperative, appears older than stated age, cachectic, and fatigued ?Head: Normocephalic, without obvious abnormality, atraumatic ?Neck: no adenopathy, no JVD, supple, symmetrical, trachea midline, and thyroid not enlarged, symmetric, no tenderness/mass/nodules ?Lymph nodes: Cervical, supraclavicular, and axillary nodes normal. ?Resp: clear to auscultation bilaterally ?Back: symmetric, no curvature. ROM normal. No CVA tenderness. ?Cardio: regular rate and rhythm, S1, S2 normal, no murmur, click, rub or gallop ?GI: soft, non-tender; bowel sounds normal; no masses,  no organomegaly ?Extremities: extremities normal, atraumatic, no cyanosis or edema ?Neurologic: Alert and oriented X 3, normal strength and tone. Normal symmetric reflexes. Normal coordination and gait ? ?ECOG PERFORMANCE STATUS: 2 - Symptomatic, <50% confined to bed ? ?Blood pressure 123/62, pulse 77, temperature (!) 96.7 ?F (35.9 ?C), temperature source Tympanic, resp. rate 18, SpO2 97 %. ? ?LABORATORY DATA: ?Lab Results  ?Component Value Date  ? WBC 9.2 05/18/2021  ? HGB 11.0 (L) 05/18/2021  ? HCT 33.8 (L) 05/18/2021  ? MCV 88.5 05/18/2021  ? PLT 289 05/18/2021  ? ? ?  Chemistry   ?   ?Component Value Date/Time  ? NA 127 (L) 04/28/2021 1554  ? K 4.5 04/28/2021 1554  ? CL 93 (L) 04/28/2021 1554  ? CO2 24 04/28/2021 1554  ? BUN 30 (H) 04/28/2021 1554  ?  CREATININE 0.42 (L) 04/28/2021 1554  ?    ?Component Value Date/Time  ? CALCIUM 9.0 04/28/2021 1554  ? ALKPHOS 158 (H) 04/28/2021 1554  ? AST 17 04/28/2021 1554  ? ALT 21 04/28/2021 1554  ? BILITOT 0.4 04/28/2021 1554  ?  ? ? ? ?RADIOGRAPHIC STUDIES: ?CT Head Wo Contrast ? ?Result Date: 04/19/2021 ?CLINICAL DATA:  Ataxia.  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 EXAM: CT HEAD WITHOUT CONTRAST TECHNIQUE: Contiguous axial images were obtained from the base of the skull through the vertex without intravenous contrast. RADIATION DOSE REDUCTION: This exam was performed according to the departmental dose-optimization program which includes automated exposure control, adjustment of the mA and/or kV according to patient size and/or use of iterative reconstruction technique. COMPARISON:  04/16/2011 FINDINGS: Brain: Diffuse cerebral atrophy. Ventricular dilatation consistent with central atrophy. Low-attenuation changes in the deep white matter consistent with small vessel ischemia. No abnormal extra-axial fluid collections. No mass effect or midline shift. Gray-white matter junctions are distinct. Basal cisterns are not effaced. No acute intracranial hemorrhage. No focal lesions are identified that might indicate metastatic disease. However, MRI is more sensitive for evaluation of metastatic lesions. Vascular: Intracranial arterial vascular calcifications. Skull: Calvarium appears intact. No acute depressed skull fractures. No focal bone lesions. Sinuses/Orbits: Paranasal sinuses demonstrate opacification of the sphenoid sinuses. Mastoid air cells are clear. 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 IMPRESSION: 1. No acute intracranial abnormalities. Chronic atrophy and small vessel ischemic changes. 2. No focal intracranial lesions are demonstrated. However, MRI is more sensitive for evaluation of intracranial metastatic disease. Electronically Signed   By: Lucienne Capers M.D.   On: 04/19/2021 00:04   ? ?CT ANGIO NECK W OR WO CONTRAST ? ?Result Date: 04/21/2021 ?CLINICAL DATA:  abnormal MRI, concern for jugular vein thrombosis vs right carotid tumor or malignant LN. EXAM: CT ANGIOGRAPHY NECK TECHNIQUE: Multidet

## 2021-05-18 NOTE — Progress Notes (Signed)
START ON PATHWAY REGIMEN - Non-Small Cell Lung ? ? ?  A cycle is every 28 days: ?    Osimertinib  ? ?**Always confirm dose/schedule in your pharmacy ordering system** ? ?Patient Characteristics: ?Stage IV Metastatic, Nonsquamous, Molecular Analysis Completed, Molecular Alteration Present and Eligible for Molecular Targeted Therapy, Initial Molecular Targeted Therapy, EGFR Mutation - Common (Exon 19 Deletion or Exon 21 L858R Substitution) ?Therapeutic Status: Stage IV Metastatic ?Histology: Nonsquamous Cell ?Broad Molecular Profiling Status: Molecular Analysis Completed ?Molecular Analysis Results: Alteration Present and Eligible for Molecular Targeted Therapy ?Molecular Alteration Present: EGFR Mutation - Common (Exon 19 Deletion or Exon 21 L858R Substitution) ?Molecular Targeted Line of Therapy: Initial Molecular Targeted Therapy ?Intent of Therapy: ?Non-Curative / Palliative Intent, Discussed with Patient ?

## 2021-05-18 NOTE — Telephone Encounter (Signed)
Oral Oncology Patient Advocate Encounter ? ?Prior Authorization for [facility] has been approved.   ? ?PA# [id number] ?Effective dates: 05/18/21 through 01/16/22 ? ?Patients co-pay is $0 ? ?Oral Oncology Clinic will continue to follow.  ? ?Wynn Maudlin CPHT ?Specialty Pharmacy Patient Advocate ?[place] ?Phone [phone] ?Fax [phone] ?05/18/2021 4:08 PM ? ?

## 2021-05-18 NOTE — Telephone Encounter (Addendum)
Oral Oncology Pharmacist Encounter ? ?I met with patient and patient's daughter to review new prescription for Tagrisso (osimertinib) for the first line therapy treatment of metastatic non-small cell lung cancer with presence of T790M EGFR mutation as well as EGFR exon 19 deletion, planned duration until disease progression or unacceptable drug toxicity. ? ?Counseled patient on administration, dosing, side effects, monitoring, drug-food interactions, safe handling, storage, and disposal. ? ?CMP and CBC w/ Diff from 05/18/21 assessed, no baseline dose adjustments required. EKG from 04/23/21 reviewed - QTcB 436 ms. OK to use as baseline EKG per Dr. Julien Nordmann. Prescription dose and frequency assessed for appropriateness.  ? ?Patient will take Tagrisso 80 tablets, 1 tablet by mouth once daily, without regard to food. ? ?Current medication list in Epic reviewed, DDIs with Tagrisso identified: ?Category C drug-drug interaction between [place] and Atorvastatin - Tagrisso may increase serum concentrations of atorvastatin leading to increase risk of ADEs including but not limited to myopathies, LFT elevation. No changes in therapy warranted at this time. ?Category B drug-drug interaction between Tagrisso and Apixaban - PgP inhibitors may increase serum concentrations of apixaban, but this effect is more likely seen in patients on moderate/strong CYP3A4 inhibitors (none identified on patient's medication list). No change in therapy warranted at this time.  ? ?Tagrisso start date: 05/21/21 ? ?Adverse effects include but are not limited to: diarrhea, mouth sores, decreased appetitie, fatigue, dry skin, rash, nail changes, altered cardiac conduction, and decreased blood counts or electrolytes.  ? ?Patient will obtain anti diarrheal and alert the office of 4 or more loose stools above baseline.  ? ?Reviewed with patient importance of keeping a medication schedule and plan for any missed doses. No barriers to medication adherence  identified. Patient agreement for treatment documented in MD note on 05/18/21. ? ?Prescription has been e-scribed to the [facility] for benefits analysis and approval. ? ?All questions answered. ? ?Ms. Name voiced understanding and appreciation.  ? ?Medication education handout given to patient. Patient knows to call the office with questions or concerns. Oral Chemotherapy Clinic phone number provided to patient.  ? ?Leron Croak, PharmD, BCPS ?Hematology/Oncology Clinical Pharmacist ?[facility] Oral Chemotherapy Navigation Clinics ?[phone] ?05/18/2021 3:53 PM ? ? ?

## 2021-05-18 NOTE — Telephone Encounter (Signed)
Oral Oncology Patient Advocate Encounter ?  ?Received notification from [facility] that prior authorization for Tagrisso is required. ?  ?PA submitted on CoverMyMeds ?[place] ?Status is pending ?  ?Oral Oncology Clinic will continue to follow. ? ?Wynn Maudlin CPHT ?Specialty Pharmacy Patient Advocate ?[place] ?Phone [phone] ?Fax [phone] ?05/18/2021 3:35 PM ? ? ?

## 2021-05-19 ENCOUNTER — Telehealth: Payer: Self-pay | Admitting: Internal Medicine

## 2021-05-19 ENCOUNTER — Other Ambulatory Visit (HOSPITAL_COMMUNITY): Payer: Self-pay

## 2021-05-19 NOTE — Telephone Encounter (Signed)
.  Called patient to schedule appointment per 5/2 inbasket,unable to leave msg, calendar mailed ?

## 2021-05-24 ENCOUNTER — Other Ambulatory Visit (HOSPITAL_COMMUNITY): Payer: Self-pay

## 2021-05-25 ENCOUNTER — Encounter: Payer: Self-pay | Admitting: Internal Medicine

## 2021-05-25 NOTE — Progress Notes (Signed)
? ?                                                                                                                                                          ?  Patient Name: MADDYSON KEIL ?MRN: [id number] ?DOB: 1945-05-15 ?Referring Physician: Kary Kos (Profile Not Attached) ?Date of Service: 05/05/2021 ?[facility] [place] ? ?                                                      End Of Treatment Note ? ?Diagnoses: C34.31-Malignant neoplasm of lower lobe, right bronchus or lung ?C79.51-Secondary malignant neoplasm of bone ? ?Cancer Staging: Stage IV NSCLC, adenocarcinoma of the RLL ? ?Intent: Palliative ? ?Radiation Treatment Dates: 04/21/2021 through 05/05/2021 ?Site Technique Total Dose (Gy) Dose per Fx (Gy) Completed Fx Beam Energies  ?Lung, Right: Lung_R 3D 30/30 3 10/10 6X, 10X, 15X  ?Thoracic Spine: Spine_T3 3D 30/30 3 10/10 10X, 15X  ? ?Narrative: The patient tolerated radiation therapy relatively well. She was able to take a few steps toward the conclusion of radiation and had a steroid taper instruction. ? ?Plan: The patient will receive a call in about one month from the radiation oncology department. She will continue follow up with Dr. Julien Nordmann as well.  ? ?________________________________________________ ? ? ? ?Carola Rhine, PAC  ?

## 2021-05-26 ENCOUNTER — Encounter: Payer: Self-pay | Admitting: *Deleted

## 2021-05-26 NOTE — Progress Notes (Signed)
Oncology Nurse Navigator Documentation ? ? ?  05/26/2021  ? 12:00 PM 05/17/2021  ? 12:00 PM 05/14/2021  ? 12:00 PM 05/05/2021  ?  3:00 PM 05/05/2021  ? 12:00 PM 05/05/2021  ? 10:00 AM 04/29/2021  ? 11:00 AM  ?Oncology Nurse Navigator Flowsheets  ?Diagnosis Status      Confirmed Diagnosis Complete   ?Planned Course of Treatment      Radiation;Targeted Therapy   ?Navigator Follow Up Date: 06/07/2021 05/18/2021 05/17/2021      ?Navigator Follow Up Reason: Follow-up Appointment Follow-up Appointment Follow-up Appointment Appointment Review;Molecular Testing  Follow-up Appointment   ?Navigation Complete Date:    05/12/2021  05/05/2021   ?Navigator Location [facility]  ?Navigator Encounter Type Appt/Treatment Plan Review;Telephone Telephone Telephone Clinic/MDC;Follow-up Appt Molecular Studies Telephone Other:  ?Telephone Outgoing Call [place] Call [place] Call   Outgoing Call   ?Patient Visit Type       Other  ?Treatment Phase    Treatment Pre-Tx/Tx Discussion Pre-Tx/Tx Discussion Pre-Tx/Tx Discussion  ?Barriers/Navigation Needs Coordination of Care;Education/I followed up on Ms. Porras's plan of care. She did not have a follow up appt with med onc scheduled. I called patient's daughter and updated her on follow up appt. I did ask if patient was taking medication and she states she is.  Coordination of Care Coordination of Care;Education Education;Coordination of Care Coordination of Care Coordination of Care;Education Coordination of Care  ?Education Other  Other Preparing for Upcoming Surgery/ Treatment  Other   ?Interventions Coordination of Care;Education Coordination of Care Coordination of Care;Education Referrals;Psycho-Social Support;Education Coordination of Care Coordination of Care;Education Coordination of Care  ?Acuity Level 2-Minimal Needs (1-2 Barriers Identified) Level 2-Minimal Needs (1-2 Barriers Identified)  Level 2-Minimal Needs (1-2 Barriers Identified) Level 2-Minimal Needs (1-2 Barriers Identified) Level 2-Minimal Needs (1-2 Barriers Identified) Level 2-Minimal Needs (1-2 Barriers Identified) Level 2-Minimal Needs (1-2 Barriers Identified)  ?Referrals    Social Work     ?Coordination of Care Appts Appts Other  Pathology Appts Other  ?Education Method Verbal   Verbal;Other  Verbal   ?Time Spent with Patient 30 30 30 30 30 30 30   ?  ?

## 2021-06-02 ENCOUNTER — Other Ambulatory Visit (HOSPITAL_COMMUNITY): Payer: Self-pay

## 2021-06-03 NOTE — Progress Notes (Deleted)
[facility] OFFICE PROGRESS NOTE  [name], MD [address] [place] [address]  DIAGNOSIS:  Stage IV (T3, N2, M1 C) non-small cell lung cancer, adenocarcinoma presented with large right lower lobe lung mass in addition to subcarinal lymphadenopathy as well as bone metastasis diagnosed in April 2023   Molecular studies by foundation 1 showed EGFR T790M, Exon 19 deletion [id number]) AKT2- Aplification-equivocal ARID1A- R1726fs*1 RICTOR- amplification-equivocal   PDL1 Expression 50%  PRIOR THERAPY: Palliative radiotherapy to the metastatic T3 bone lesion under the care of Dr. Lisbeth Renshaw  CURRENT THERAPY:  Tagrisso 80 mg p.o. daily.   First dose on***  INTERVAL HISTORY: Brieann Osinski 76 y.o. female returnsto clinic today for follow-up visit accompanied by her daughter.  The patient is feeling ****today.  She was unfortunately recently diagnosed with stage IV lung cancer.  She also has some weakness in her lower extremity secondary to cord compression?  She completed palliative radiation to a T3 bone lesion as well.  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  Her first dose was on ***.  Thus far, she has been tolerating this ***.    today she denies any fever, chills, or night sweats.  She is reporting a good appetite but is having a hard time gaining weight.  Dr. Julien Nordmann referred her to the dietitian at her last appointment.  She denies any chest pain or hemoptysis but sometimes continues to have rattling in her chest with deep breathing.  Shortness of breath?  Cough?  She denies any nausea, vomiting, diarrhea, or constipation.  Denies any headache or visual changes.  Denies any rashes or skin changes.  She is here today for evaluation and repeat blood work.      MEDICAL HISTORY: Past Medical History:  Diagnosis Date   COPD (chronic obstructive pulmonary disease) with chronic bronchitis (HCC)     HTN (hypertension)    Pneumothorax     ALLERGIES:  has No Known Allergies.  MEDICATIONS:  Current Outpatient Medications  Medication Sig Dispense Refill   acetaminophen (TYLENOL) 500 MG tablet Take 500 mg by mouth every 6 (six) hours as needed for moderate pain or headache.     apixaban (ELIQUIS) 5 MG TABS tablet Take 1 tablet (5 mg total) by mouth 2 (two) times daily. Follow up with Dr. Julien Nordmann for additional refills 60 tablet 0   atenolol (TENORMIN) 50 MG tablet Take 1 tablet (50 mg total) by mouth daily. 30 tablet 0   atenolol-chlorthalidone (TENORETIC) 50-25 MG tablet Take 1 tablet by mouth daily.     atorvastatin (LIPITOR) 40 MG tablet Take 40 mg by mouth daily.     dexamethasone (DECADRON) 4 MG tablet See Taper instruction sheet 30 tablet 1   gabapentin (NEURONTIN) 300 MG capsule Take 300 mg by mouth 2 (two) times daily.     metFORMIN (GLUCOPHAGE) 500 MG tablet Take 500 mg by mouth daily.     montelukast (SINGULAIR) 10 MG tablet Take 10 mg by mouth at bedtime.     osimertinib mesylate (TAGRISSO) 80 MG tablet Take 1 tablet (80 mg total) by mouth daily. 30 tablet 3   polyethylene glycol (MIRALAX / GLYCOLAX) 17 g packet Take 17 g by mouth daily as needed for mild constipation. Meds to bed, thanks 14 each 0   promethazine-dextromethorphan (PROMETHAZINE-DM) 6.25-15 MG/5ML syrup Take 5 mLs by mouth 4 (four) times daily as needed for cough.  WIXELA INHUB 250-50 MCG/ACT AEPB 1 puff 2 (two) times daily.     No current facility-administered medications for this visit.    SURGICAL HISTORY:  Past Surgical History:  Procedure Laterality Date   BRONCHIAL NEEDLE ASPIRATION BIOPSY  04/20/2021   Procedure: BRONCHIAL NEEDLE ASPIRATION BIOPSIES;  Surgeon: Candee Furbish, MD;  Location: [facility];  Service: Endoscopy;;   BRONCHIAL WASHINGS  04/20/2021   Procedure: BRONCHIAL WASHINGS;  Surgeon: Candee Furbish, MD;  Location: [facility];  Service: Endoscopy;;   ENDOBRONCHIAL ULTRASOUND N/A  04/20/2021   Procedure: ENDOBRONCHIAL ULTRASOUND;  Surgeon: Candee Furbish, MD;  Location: [facility];  Service: Endoscopy;  Laterality: N/A;   HIP ARTHROPLASTY     VIDEO BRONCHOSCOPY Left 04/20/2021   Procedure: VIDEO BRONCHOSCOPY WITH FLUORO;  Surgeon: Candee Furbish, MD;  Location: [facility];  Service: Endoscopy;  Laterality: Left;  EBUS    REVIEW OF SYSTEMS:   Review of Systems  Constitutional: Negative for appetite change, chills, fatigue, fever and unexpected weight change.  HENT:   Negative for mouth sores, nosebleeds, sore throat and trouble swallowing.   Eyes: Negative for eye problems and icterus.  Respiratory: Negative for cough, hemoptysis, shortness of breath and wheezing.   Cardiovascular: Negative for chest pain and leg swelling.  Gastrointestinal: Negative for abdominal pain, constipation, diarrhea, nausea and vomiting.  Genitourinary: Negative for bladder incontinence, difficulty urinating, dysuria, frequency and hematuria.   Musculoskeletal: Negative for back pain, gait problem, neck pain and neck stiffness.  Skin: Negative for itching and rash.  Neurological: Negative for dizziness, extremity weakness, gait problem, headaches, light-headedness and seizures.  Hematological: Negative for adenopathy. Does not bruise/bleed easily.  Psychiatric/Behavioral: Negative for confusion, depression and sleep disturbance. The patient is not nervous/anxious.     PHYSICAL EXAMINATION:  There were no vitals taken for this visit.  ECOG PERFORMANCE STATUS: {CHL ONC ECOG [id number]  Physical Exam  Constitutional: Oriented to person, place, and time and well-developed, well-nourished, and in no distress. No distress.  HENT:  Head: Normocephalic and atraumatic.  Mouth/Throat: Oropharynx is clear and moist. No oropharyngeal exudate.  Eyes: Conjunctivae are normal. Right eye exhibits no discharge. Left eye exhibits no discharge. No scleral icterus.  Neck: Normal range of motion.  Neck supple.  Cardiovascular: Normal rate, regular rhythm, normal heart sounds and intact distal pulses.   Pulmonary/Chest: Effort normal and breath sounds normal. No respiratory distress. No wheezes. No rales.  Abdominal: Soft. Bowel sounds are normal. Exhibits no distension and no mass. There is no tenderness.  Musculoskeletal: Normal range of motion. Exhibits no edema.  Lymphadenopathy:    No cervical adenopathy.  Neurological: Alert and oriented to person, place, and time. Exhibits normal muscle tone. Gait normal. Coordination normal.  Skin: Skin is warm and dry. No rash noted. Not diaphoretic. No erythema. No pallor.  Psychiatric: Mood, memory and judgment normal.  Vitals reviewed.  LABORATORY DATA: Lab Results  Component Value Date   WBC 9.2 05/18/2021   HGB 11.0 (L) 05/18/2021   HCT 33.8 (L) 05/18/2021   MCV 88.5 05/18/2021   PLT 289 05/18/2021      Chemistry      Component Value Date/Time   NA 136 05/18/2021 1446   K 4.3 05/18/2021 1446   CL 101 05/18/2021 1446   CO2 29 05/18/2021 1446   BUN 12 05/18/2021 1446   CREATININE 0.72 05/18/2021 1446      Component Value Date/Time   CALCIUM 8.9 05/18/2021 1446   ALKPHOS 138 (H) 05/18/2021  1446   AST 11 (L) 05/18/2021 1446   ALT 12 05/18/2021 1446   BILITOT 0.4 05/18/2021 1446       RADIOGRAPHIC STUDIES:  No results found.   ASSESSMENT/PLAN:  This is a very pleasant 76 year old African-American female diagnosed with stage IV (T3, N2, M1 C) non-small cell lung cancer, adenocarcinoma.  She presented with a left lower lobe lung mass in addition to subcarinal adenopathy.  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  She was diagnosed in April 2023.  Her molecular studies show that she is positive for an EGFR mutation with deletion in exon 19 in addition to T790 and.  Her PD-L1 expression is 50 percent.  Therefore the patient is currently undergoing targeted treatment with Tagrisso 80  mg p.o. daily rather than immunotherapy.  Her first dose of treatment was on ***.  She is tolerating this well except for ***.   The patient was seen with Dr. Julien Nordmann today.  Labs were reviewed.  Recommend that she ***on the same treatment at the same dose.  We will see her back for follow-up visit in 2 weeks for evaluation and repeat blood work.  Dietitian?  Albuterol?  The patient was advised to call immediately if she has any concerning symptoms in the interval. The patient voices understanding of current disease status and treatment options and is in agreement with the current care plan. All questions were answered. The patient knows to call the clinic with any problems, questions or concerns. We can certainly see the patient much sooner if necessary       No orders of the defined types were placed in this encounter.    I spent {CHL ONC TIME VISIT - FQMKJ:[id number]} counseling the patient face to face. The total time spent in the appointment was {CHL ONC TIME VISIT - LRJPV:[id number]}.  Selina Tapper L Hanin [place], PA-C 06/03/21

## 2021-06-07 ENCOUNTER — Other Ambulatory Visit (HOSPITAL_COMMUNITY): Payer: Self-pay

## 2021-06-07 ENCOUNTER — Inpatient Hospital Stay: Payer: Medicare Other | Admitting: Physician Assistant

## 2021-06-07 ENCOUNTER — Inpatient Hospital Stay: Payer: Medicare Other

## 2021-06-07 NOTE — Progress Notes (Unsigned)
[facility] OFFICE PROGRESS NOTE  [name], MD [address] [place] [address]  DIAGNOSIS:  Stage IV (T3, N2, M1 C) non-small cell lung cancer, adenocarcinoma presented with large right lower lobe lung mass in addition to subcarinal lymphadenopathy as well as bone metastasis diagnosed in April 2023   Molecular studies by foundation 1 showed EGFR T790M, Exon 19 deletion [id number]) AKT2- Aplification-equivocal ARID1A- R1724fs*1 RICTOR- amplification-equivocal   PDL1 Expression 50%  PRIOR THERAPY: Palliative radiotherapy to the metastatic T3 bone lesion under the care of Dr. Lisbeth Renshaw  CURRENT THERAPY:  Tagrisso 80 mg p.o. daily.  First dose on ~5/6  INTERVAL HISTORY: Anna Finley 76 y.o. female returns to the clinic today for a follow-up visit accompanied by her daughter, Peter Congo.  The patient is feeling fair today.  She was unfortunately recently diagnosed with stage IV lung cancer.  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  She completed palliative radiation to a T3 bone lesion as well. A few weeks ago, she was unable to move her lower extremities. She is still very week but she is able to move her lower extremities today. She also was given a walker at her last appointment and reportedly is able to walk a little at home with the assistance of her walker. She was unable to be weighted at the appointment today though due to needing more assistance bearing weight and lightheadedness. 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  Her first dose was on 05/22/21.  Thus far, she has been tolerating this fairly well. The patient thinks it makes her feel "funny", when asked to elaborate, she thinks it is making her lightheaded. Of note, the patient has poor nutrition. She was referred to  a member of the nutritionist at the last appointment.    Today, she denies any fever, chills, or night sweats. She denies any chest pain or hemoptysis. She reports shortness of breath with exertion, but does think this is improved compared to prior. She also has a cough but does not take anything for cough. She denies any nausea, vomiting, diarrhea, or constipation.  She sometimes has a mild headache which improves with tylenol. Of note, the patient had a staging brain MRI on 04/19/21 which showed no metastatic disease to the brain but noted a right jugular vein thrombus. She is on eliquis. It is unclear if she is taking this. Denies any rashes or skin changes.  She is here today for evaluation and repeat blood work.   MEDICAL HISTORY: Past Medical History:  Diagnosis Date   COPD (chronic obstructive pulmonary disease) with chronic bronchitis (HCC)    HTN (hypertension)    Pneumothorax     ALLERGIES:  has No Known Allergies.  MEDICATIONS:  Current Outpatient Medications  Medication Sig Dispense Refill   mirtazapine (REMERON) 15 MG tablet Take 1 tablet (15 mg total) by mouth at bedtime. 30 tablet 2   potassium chloride SA (KLOR-CON M) 20 MEQ tablet Take 1 tablet (20 mEq total) by mouth daily. 5 tablet 0   acetaminophen (TYLENOL) 500 MG tablet Take 500 mg by mouth every 6 (six) hours as needed for moderate pain or headache.     apixaban (ELIQUIS) 5 MG TABS tablet Take 1 tablet (5 mg total) by mouth 2 (two) times daily. Follow up with Dr. Julien Nordmann  for additional refills 60 tablet 1   atenolol (TENORMIN) 50 MG tablet Take 1 tablet (50 mg total) by mouth daily. 30 tablet 0   atenolol-chlorthalidone (TENORETIC) 50-25 MG tablet Take 1 tablet by mouth daily.     atorvastatin (LIPITOR) 40 MG tablet Take 40 mg by mouth daily.     dexamethasone (DECADRON) 4 MG tablet See Taper instruction sheet 30 tablet 1   gabapentin (NEURONTIN) 300 MG capsule Take 300 mg by mouth 2 (two) times daily.     metFORMIN  (GLUCOPHAGE) 500 MG tablet Take 500 mg by mouth daily.     montelukast (SINGULAIR) 10 MG tablet Take 10 mg by mouth at bedtime.     osimertinib mesylate (TAGRISSO) 80 MG tablet Take 1 tablet (80 mg total) by mouth daily. 30 tablet 3   polyethylene glycol (MIRALAX / GLYCOLAX) 17 g packet Take 17 g by mouth daily as needed for mild constipation. Meds to bed, thanks 14 each 0   promethazine-dextromethorphan (PROMETHAZINE-DM) 6.25-15 MG/5ML syrup Take 5 mLs by mouth 4 (four) times daily as needed for cough.     WIXELA INHUB 250-50 MCG/ACT AEPB 1 puff 2 (two) times daily.     No current facility-administered medications for this visit.    SURGICAL HISTORY:  Past Surgical History:  Procedure Laterality Date   BRONCHIAL NEEDLE ASPIRATION BIOPSY  04/20/2021   Procedure: BRONCHIAL NEEDLE ASPIRATION BIOPSIES;  Surgeon: Lorin Glass, MD;  Location: [facility];  Service: Endoscopy;;   BRONCHIAL WASHINGS  04/20/2021   Procedure: BRONCHIAL WASHINGS;  Surgeon: Lorin Glass, MD;  Location: [facility];  Service: Endoscopy;;   ENDOBRONCHIAL ULTRASOUND N/A 04/20/2021   Procedure: ENDOBRONCHIAL ULTRASOUND;  Surgeon: Lorin Glass, MD;  Location: [facility];  Service: Endoscopy;  Laterality: N/A;   HIP ARTHROPLASTY     VIDEO BRONCHOSCOPY Left 04/20/2021   Procedure: VIDEO BRONCHOSCOPY WITH FLUORO;  Surgeon: Lorin Glass, MD;  Location: [facility];  Service: Endoscopy;  Laterality: Left;  EBUS    REVIEW OF SYSTEMS:   Review of Systems  Constitutional: Positive for fatigue, generalized weakness, and decreased appetite.  Unclear if she experience weight loss as she was unable to stand on the scale today.  Negative for chills and fever . HENT:   Negative for mouth sores, nosebleeds, sore throat and trouble swallowing.   Eyes: Negative for eye problems and icterus.  Respiratory: Positive for shortness of breath with exertion and cough.  Reportedly improved somewhat compared to prior. negative for  hemoptysis and wheezing.   Cardiovascular: Negative for chest pain and leg swelling.  Gastrointestinal: Negative for abdominal pain, constipation, diarrhea, nausea and vomiting.  Genitourinary: Negative for bladder incontinence, difficulty urinating, dysuria, frequency and hematuria.   Musculoskeletal: Negative for back pain, gait problem, neck pain and neck stiffness.  Skin: Negative for itching and rash.  Neurological: Positive for lightheadedness and dizziness.  Positive for difficulty ambulating secondary to lower extremity weakness bilaterally.  Positive for intermittent headaches that improved with Tylenol.  Negative for seizures.  Hematological: Negative for adenopathy. Does not bruise/bleed easily.  Psychiatric/Behavioral: Negative for confusion, depression and sleep disturbance. The patient is not nervous/anxious.     PHYSICAL EXAMINATION:  Blood pressure 130/69, pulse (!) 109, temperature 97.9 F (36.6 C), temperature source Tympanic, resp. rate 17, SpO2 100 %.  ECOG PERFORMANCE STATUS: 3-4  Physical Exam  Constitutional: Oriented to person, place, and time and cachectic appearing female and in no distress.  HENT:  Head: Normocephalic and atraumatic.  Mouth/Throat: Oropharynx is clear and moist. No oropharyngeal exudate.  Eyes: Conjunctivae are normal. Right eye exhibits no discharge. Left eye exhibits no discharge. No scleral icterus.  Neck: Normal range of motion. Neck supple.  Cardiovascular: Normal rate, regular rhythm, normal heart sounds and intact distal pulses.   Pulmonary/Chest: Effort normal.  Quiet breath sounds bilaterally.  No respiratory distress. No wheezes. No rales.  Abdominal: Soft. Bowel sounds are normal. Exhibits no distension and no mass. There is no tenderness.  Musculoskeletal: Normal range of motion. Exhibits no edema.  Able to move her lower extremities in the wheelchair but unable to bear weight without assistance. Lymphadenopathy:    No cervical  adenopathy.  Neurological: Alert and oriented to person, place, and time. Exhibits also wasting.  The patient was examined in the wheelchair today.  Skin: Skin is warm and dry. No rash noted. Not diaphoretic. No erythema. No pallor.  Psychiatric: Mood, memory and judgment normal.  Vitals reviewed.  LABORATORY DATA: Lab Results  Component Value Date   WBC 11.7 (H) 06/09/2021   HGB 11.7 (L) 06/09/2021   HCT 35.3 (L) 06/09/2021   MCV 88.3 06/09/2021   PLT 399 06/09/2021      Chemistry      Component Value Date/Time   NA 136 06/09/2021 1507   K 3.2 (L) 06/09/2021 1507   CL 95 (L) 06/09/2021 1507   CO2 28 06/09/2021 1507   BUN 17 06/09/2021 1507   CREATININE 0.69 06/09/2021 1507      Component Value Date/Time   CALCIUM 9.2 06/09/2021 1507   ALKPHOS 109 06/09/2021 1507   AST 14 (L) 06/09/2021 1507   ALT 11 06/09/2021 1507   BILITOT 0.5 06/09/2021 1507       RADIOGRAPHIC STUDIES:  No results found.   ASSESSMENT/PLAN:  This is a very pleasant 76 year old African-American female diagnosed with stage IV (T3, N2, M1 C) non-small cell lung cancer, adenocarcinoma.  She presented with a left lower lobe lung mass in addition to subcarinal adenopathy.  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  She was diagnosed in April 2023.  Her molecular studies show that she is positive for an EGFR mutation with deletion in exon 19 in addition to T790 and.  Her PD-L1 expression is 50 percent.  Therefore the patient is currently undergoing targeted treatment with Tagrisso 80 mg p.o. daily rather than immunotherapy.  Her first dose of treatment was on 05/22/21.  She is tolerating this well.  Dr. Julien Nordmann discussed with the patient that her lightheadedness is likely not secondary to her [place] Nip and is likely due to her poor nutrition.  The patient was seen with Dr. Julien Nordmann today.  Labs were reviewed.  Labs show mild hypokalemia.  I sent a prescription of  potassium to her pharmacy.  Advised if she has a challenging time swallowing this that she may crush the pills.  Dr. Julien Nordmann recommend that she continue on the same treatment at the same dose.   We will see her back for follow-up visit in 2 weeks for evaluation and repeat blood work.  We will likely arrange for repeat imaging in approximately 6 weeks from now.  Regarding her generalized weakness this is likely multifactorial secondary to the epidural tumor extension.  Overall, she is able to move her lower extremities which she was reportedly unable to do a few weeks prior.  Her daughter states that she is able to bear weight at home with the assistance of  her walker.  We will arrange for the patient to have home health physical therapy.  Dr. Julien Nordmann believes a lot of her dizziness and lightheadedness is due to poor nutrition.  We will arrange for her to see the dietitian.  I also encouraged the patient to drink supplemental protein drinks.  I have also prescribed Remeron for the patient to take at nighttime to help her fall asleep and increase her appetite.  Regarding her cough she states this is proved compared to prior but I discussed she may take Delsym or Robitussin if needed.  She reportedly has inhalers at home for wheezing and shortness of breath.  We will refill a 65-month supply of Eliquis for the thrombus that was noted on her brain MRI on 04/19/2021.  I discussed with the patient and her daughter that we prescribe her blood thinner, Remeron, antiemetics, and Tagrisso.  All her other medications should continue to be filled by her PCP.  I provided the patient's daughter with a work note today.  The patient was advised to call immediately if she has any concerning symptoms in the interval. The patient voices understanding of current disease status and treatment options and is in agreement with the current care plan. All questions were answered. The patient knows to call the clinic with any  problems, questions or concerns. We can certainly see the patient much sooner if necessary   Orders Placed This Encounter  Procedures   CBC with Differential ([facility] Only)    Standing Status:   Future    Standing Expiration Date:   06/10/2022   CMP ([facility] only)    Standing Status:   Future    Standing Expiration Date:   06/10/2022   Ambulatory Referral to [facility] Nutrition    Referral Priority:   Routine    Referral Type:   Consultation    Referral Reason:   Specialty Services Required    Number of Visits Requested:   [place], PA-C 06/09/21  ADDENDUM: Hematology/Oncology Attending: I had a face-to-face encounter with the patient today.  I reviewed her records, lab and recommended her care plan.  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 She underwent palliative radiotherapy to the metastatic T3 bone lesion under the care of Dr. Lisbeth Renshaw. The patient has molecular studies that showed positive EGFR mutation with deletion in exon 19 in addition to T790M mutation.  She also has PD-L1 expression of 50%. The patient is started systemic treatment with targeted therapy with Tagrisso 80 mg p.o. daily on 05/22/2021.  She has been tolerating this treatment well with no concerning adverse effects. She continues to have some mild lightheadedness likely related to poor p.o. intake and malnutrition. I recommended for the patient to continue her treatment with Tagrisso as planned. 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 The patient will come back for follow-up visit in 2 weeks for evaluation  and repeat blood work. She was advised to call immediately if she has any other concerning symptoms in the interval. The total time spent in the appointment was 30 minutes. Disclaimer: This note was  dictated with voice recognition software. Similar sounding words can inadvertently be transcribed and may be missed upon review. Eilleen Kempf, MD

## 2021-06-08 ENCOUNTER — Inpatient Hospital Stay: Payer: Medicare Other | Admitting: Dietician

## 2021-06-09 ENCOUNTER — Other Ambulatory Visit: Payer: Self-pay

## 2021-06-09 ENCOUNTER — Inpatient Hospital Stay: Payer: Medicare Other

## 2021-06-09 ENCOUNTER — Other Ambulatory Visit (HOSPITAL_COMMUNITY): Payer: Self-pay

## 2021-06-09 ENCOUNTER — Inpatient Hospital Stay (HOSPITAL_BASED_OUTPATIENT_CLINIC_OR_DEPARTMENT_OTHER): Payer: Medicare Other | Admitting: Physician Assistant

## 2021-06-09 VITALS — BP 130/69 | HR 109 | Temp 97.9°F | Resp 17

## 2021-06-09 DIAGNOSIS — E876 Hypokalemia: Secondary | ICD-10-CM

## 2021-06-09 DIAGNOSIS — C3431 Malignant neoplasm of lower lobe, right bronchus or lung: Secondary | ICD-10-CM

## 2021-06-09 DIAGNOSIS — R630 Anorexia: Secondary | ICD-10-CM | POA: Diagnosis not present

## 2021-06-09 DIAGNOSIS — I8290 Acute embolism and thrombosis of unspecified vein: Secondary | ICD-10-CM | POA: Diagnosis not present

## 2021-06-09 DIAGNOSIS — G9520 Unspecified cord compression: Secondary | ICD-10-CM

## 2021-06-09 DIAGNOSIS — R29898 Other symptoms and signs involving the musculoskeletal system: Secondary | ICD-10-CM

## 2021-06-09 LAB — CBC WITH DIFFERENTIAL (CANCER CENTER ONLY)
Abs Immature Granulocytes: 0.13 10*3/uL — ABNORMAL HIGH (ref 0.00–0.07)
Basophils Absolute: 0 10*3/uL (ref 0.0–0.1)
Basophils Relative: 0 %
Eosinophils Absolute: 0.1 10*3/uL (ref 0.0–0.5)
Eosinophils Relative: 1 %
HCT: 35.3 % — ABNORMAL LOW (ref 36.0–46.0)
Hemoglobin: 11.7 g/dL — ABNORMAL LOW (ref 12.0–15.0)
Immature Granulocytes: 1 %
Lymphocytes Relative: 7 %
Lymphs Abs: 0.8 10*3/uL (ref 0.7–4.0)
MCH: 29.3 pg (ref 26.0–34.0)
MCHC: 33.1 g/dL (ref 30.0–36.0)
MCV: 88.3 fL (ref 80.0–100.0)
Monocytes Absolute: 0.7 10*3/uL (ref 0.1–1.0)
Monocytes Relative: 6 %
Neutro Abs: 10 10*3/uL — ABNORMAL HIGH (ref 1.7–7.7)
Neutrophils Relative %: 85 %
Platelet Count: 399 10*3/uL (ref 150–400)
RBC: 4 MIL/uL (ref 3.87–5.11)
RDW: 17 % — ABNORMAL HIGH (ref 11.5–15.5)
WBC Count: 11.7 10*3/uL — ABNORMAL HIGH (ref 4.0–10.5)
nRBC: 0 % (ref 0.0–0.2)

## 2021-06-09 LAB — CMP (CANCER CENTER ONLY)
ALT: 11 U/L (ref 0–44)
AST: 14 U/L — ABNORMAL LOW (ref 15–41)
Albumin: 3.2 g/dL — ABNORMAL LOW (ref 3.5–5.0)
Alkaline Phosphatase: 109 U/L (ref 38–126)
Anion gap: 13 (ref 5–15)
BUN: 17 mg/dL (ref 8–23)
CO2: 28 mmol/L (ref 22–32)
Calcium: 9.2 mg/dL (ref 8.9–10.3)
Chloride: 95 mmol/L — ABNORMAL LOW (ref 98–111)
Creatinine: 0.69 mg/dL (ref 0.44–1.00)
GFR, Estimated: >60 mL/min (ref >60)
Glucose, Bld: 136 mg/dL — ABNORMAL HIGH (ref 70–99)
Potassium: 3.2 mmol/L — ABNORMAL LOW (ref 3.5–5.1)
Sodium: 136 mmol/L (ref 135–145)
Total Bilirubin: 0.5 mg/dL (ref 0.3–1.2)
Total Protein: 7.4 g/dL (ref 6.5–8.1)

## 2021-06-09 MED ORDER — APIXABAN 5 MG PO TABS: 5.0000 mg | ORAL_TABLET | Freq: Two times a day (BID) | ORAL | 1 refills | Status: DC

## 2021-06-09 MED ORDER — MIRTAZAPINE 15 MG PO TABS: 15.0000 mg | ORAL_TABLET | Freq: Every day | ORAL | 2 refills | Status: DC

## 2021-06-09 MED ORDER — POTASSIUM CHLORIDE CRYS ER 20 MEQ PO TBCR: 20.0000 meq | EXTENDED_RELEASE_TABLET | Freq: Every day | ORAL | 0 refills | Status: DC

## 2021-06-10 LAB — TSH: TSH: 0.751 u[IU]/mL (ref 0.350–4.500)

## 2021-06-11 ENCOUNTER — Encounter: Payer: Self-pay | Admitting: *Deleted

## 2021-06-11 NOTE — Progress Notes (Signed)
Oncology Nurse Navigator Documentation     06/11/2021    1:00 PM 05/26/2021   12:00 PM 05/17/2021   12:00 PM 05/14/2021   12:00 PM 05/05/2021    3:00 PM 05/05/2021   12:00 PM 05/05/2021   10:00 AM  Oncology Nurse Navigator Flowsheets  Diagnosis Status       Confirmed Diagnosis Complete  Planned Course of Treatment       Radiation;Targeted Therapy  Navigator Follow Up Date: 06/29/2021 06/07/2021 05/18/2021 05/17/2021     Navigator Follow Up Reason: Follow-up Appointment Follow-up Appointment Follow-up Appointment Follow-up Appointment Appointment Review;Molecular Testing  Follow-up Appointment  Navigation Complete Date:     05/12/2021  05/05/2021  Navigator Location [facility]  Navigator Encounter Type Appt/Treatment Plan Review Appt/Treatment Plan Review;Telephone Telephone Telephone Clinic/MDC;Follow-up Appt Molecular Studies Telephone  Telephone  Outgoing Call Outgoing Call Outgoing Call   Outgoing Call  Treatment Phase Treatment    Treatment Pre-Tx/Tx Discussion Pre-Tx/Tx Discussion  Barriers/Navigation Needs Coordination of Care Coordination of Care;Education Coordination of Care Coordination of Care;Education Education;Coordination of Care Coordination of Care Coordination of Care;Education  Education  Other  Other Preparing for Upcoming Surgery/ Treatment  Other  Interventions Coordination of Care/ I followed up on Anna Finley tx plan. Patient is adhering to plan and has follow up in a few weeks with med onc and nutrition.  Coordination of Care;Education Coordination of Care Coordination of Care;Education Referrals;Psycho-Social Support;Education Coordination of Care Coordination of Care;Education  Acuity Level 2-Minimal Needs (1-2 Barriers Identified) Level 2-Minimal Needs (1-2 Barriers Identified) Level 2-Minimal Needs (1-2 Barriers Identified) Level 2-Minimal Needs (1-2 Barriers Identified)  Level 2-Minimal Needs (1-2 Barriers Identified) Level 2-Minimal Needs (1-2 Barriers Identified) Level 2-Minimal Needs (1-2 Barriers Identified)  Referrals     Social Work    Coordination of Care Other Appts Appts Other  Pathology Appts  Education Method  Verbal   Verbal;Other  Verbal  Time Spent with Patient 30 30 30 30 30 30  30

## 2021-06-16 ENCOUNTER — Other Ambulatory Visit (HOSPITAL_COMMUNITY): Payer: Self-pay

## 2021-06-21 ENCOUNTER — Ambulatory Visit
Admission: RE | Admit: 2021-06-21 | Discharge: 2021-06-21 | Disposition: A | Discharge status: Home / Self Care | Payer: Medicare Other | Source: Ambulatory Visit | Attending: Radiation Oncology | Admitting: Radiation Oncology

## 2021-06-21 DIAGNOSIS — C3431 Malignant neoplasm of lower lobe, right bronchus or lung: Secondary | ICD-10-CM | POA: Insufficient documentation

## 2021-06-24 NOTE — Progress Notes (Deleted)
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 MLisbeth Finley CURRENT THERAPY: Tagrisso 80 mg p.o. daily.  First dose on ~5/6  INTERVAL HISTORY: Anna Sporer76y.o. female returns to clinic today for follow-up visit accompanied by her daughter.  The patient is feeling fair today.  The patient was recently diagnosed with stage IV lung cancer.  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  She completed radiation to T3.  At her last appointment, we referred her to physical therapy.  This has *** .  Her weakness is ***and she is a little more ***active?  The patient is currently undergoing targeted treatment with Tagrisso due to her EGFR mutation.  Thus far she is tolerating this fairly well.  At her last appointment she thought that Tagrisso made her feel "funny".  When asked to elaborate she reports lightheadedness.  Of note the patient has poor nutrition.  We referred her to a dietitian.  I also prescribed her Remeron as well.  Her appetite has ***since she was last seen.  Weight?  Otherwise she denies any new concerning complaints today.  Denies any fever, chills, or night sweats.  Denies any chest pain or mops this.  Denies any nausea, vomiting, diarrhea, or constipation.  She reports dyspnea on exertion  which she thinks is improved compared to prior.  She has a mild cough but not enough to warrant taking any cough medication.  Discussed with her last appointment that she can take Delsym or Robitussin if needed.  She has mild headaches which improves with Tylenol.  The patient had a staging brain MRI which is negative for metastatic disease to the brain of note.  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 she is here today for evaluation and repeat blood work.  MEDICAL HISTORY: Past Medical History:  Diagnosis Date   COPD (chronic obstructive pulmonary disease) with chronic bronchitis (HCC)    HTN (hypertension)    Pneumothorax     ALLERGIES:  has No Known Allergies.  MEDICATIONS:  Current Outpatient Medications  Medication Sig Dispense Refill   acetaminophen (TYLENOL) 500 MG tablet Take 500 mg by mouth every 6 (six) hours as needed for moderate pain or headache.     apixaban (ELIQUIS) 5 MG TABS tablet Take 1 tablet (5 mg total) by mouth 2 (two) times daily. Follow up with Dr. MJulien Nordmannfor additional refills 60 tablet 1   atenolol (TENORMIN) 50 MG tablet Take 1 tablet (50 mg total) by mouth daily. 30 tablet 0   atenolol-chlorthalidone (TENORETIC) 50-25 MG tablet Take 1 tablet by mouth daily.     atorvastatin (LIPITOR) 40 MG tablet Take 40 mg by mouth daily.     dexamethasone (DECADRON) 4 MG tablet See Taper instruction sheet 30 tablet 1   gabapentin (NEURONTIN) 300 MG capsule Take 300 mg by mouth 2 (two) times daily.  metFORMIN (GLUCOPHAGE) 500 MG tablet Take 500 mg by mouth daily.     mirtazapine (REMERON) 15 MG tablet Take 1 tablet (15 mg total) by mouth at bedtime. 30 tablet 2   montelukast (SINGULAIR) 10 MG tablet Take 10 mg by mouth at bedtime.     osimertinib mesylate (TAGRISSO) 80 MG tablet Take 1 tablet (80 mg total) by mouth daily. 30 tablet 3   polyethylene glycol (MIRALAX / GLYCOLAX) 17 g packet Take 17 g by mouth daily as needed for mild constipation. Meds to  bed, thanks 14 each 0   potassium chloride SA (KLOR-CON M) 20 MEQ tablet Take 1 tablet (20 mEq total) by mouth daily. 5 tablet 0   promethazine-dextromethorphan (PROMETHAZINE-DM) 6.25-15 MG/5ML syrup Take 5 mLs by mouth 4 (four) times daily as needed for cough.     WIXELA INHUB 250-50 MCG/ACT AEPB 1 puff 2 (two) times daily.     No current facility-administered medications for this visit.    SURGICAL HISTORY:  Past Surgical History:  Procedure Laterality Date   BRONCHIAL NEEDLE ASPIRATION BIOPSY  04/20/2021   Procedure: BRONCHIAL NEEDLE ASPIRATION BIOPSIES;  Surgeon: Candee Furbish, MD;  Location: [facility];  Service: Endoscopy;;   BRONCHIAL WASHINGS  04/20/2021   Procedure: BRONCHIAL WASHINGS;  Surgeon: Candee Furbish, MD;  Location: [facility];  Service: Endoscopy;;   ENDOBRONCHIAL ULTRASOUND N/A 04/20/2021   Procedure: ENDOBRONCHIAL ULTRASOUND;  Surgeon: Candee Furbish, MD;  Location: [facility];  Service: Endoscopy;  Laterality: N/A;   HIP ARTHROPLASTY     VIDEO BRONCHOSCOPY Left 04/20/2021   Procedure: VIDEO BRONCHOSCOPY WITH FLUORO;  Surgeon: Candee Furbish, MD;  Location: [facility];  Service: Endoscopy;  Laterality: Left;  EBUS    REVIEW OF SYSTEMS:   Review of Systems  Constitutional: Negative for appetite change, chills, fatigue, fever and unexpected weight change.  HENT:   Negative for mouth sores, nosebleeds, sore throat and trouble swallowing.   Eyes: Negative for eye problems and icterus.  Respiratory: Negative for cough, hemoptysis, shortness of breath and wheezing.   Cardiovascular: Negative for chest pain and leg swelling.  Gastrointestinal: Negative for abdominal pain, constipation, diarrhea, nausea and vomiting.  Genitourinary: Negative for bladder incontinence, difficulty urinating, dysuria, frequency and hematuria.   Musculoskeletal: Negative for back pain, gait problem, neck pain and neck stiffness.  Skin: Negative for itching and rash.  Neurological:  Negative for dizziness, extremity weakness, gait problem, headaches, light-headedness and seizures.  Hematological: Negative for adenopathy. Does not bruise/bleed easily.  Psychiatric/Behavioral: Negative for confusion, depression and sleep disturbance. The patient is not nervous/anxious.     PHYSICAL EXAMINATION:  There were no vitals taken for this visit.  ECOG PERFORMANCE STATUS: {CHL ONC ECOG [id number]  Physical Exam  Constitutional: Oriented to person, place, and time and well-developed, well-nourished, and in no distress. No distress.  HENT:  Head: Normocephalic and atraumatic.  Mouth/Throat: Oropharynx is clear and moist. No oropharyngeal exudate.  Eyes: Conjunctivae are normal. Right eye exhibits no discharge. Left eye exhibits no discharge. No scleral icterus.  Neck: Normal range of motion. Neck supple.  Cardiovascular: Normal rate, regular rhythm, normal heart sounds and intact distal pulses.   Pulmonary/Chest: Effort normal and breath sounds normal. No respiratory distress. No wheezes. No rales.  Abdominal: Soft. Bowel sounds are normal. Exhibits no distension and no mass. There is no tenderness.  Musculoskeletal: Normal range of motion. Exhibits no edema.  Lymphadenopathy:    No cervical adenopathy.  Neurological: Alert and oriented to person,  place, and time. Exhibits normal muscle tone. Gait normal. Coordination normal.  Skin: Skin is warm and dry. No rash noted. Not diaphoretic. No erythema. No pallor.  Psychiatric: Mood, memory and judgment normal.  Vitals reviewed.  LABORATORY DATA: Lab Results  Component Value Date   WBC 11.7 (H) 06/09/2021   HGB 11.7 (L) 06/09/2021   HCT 35.3 (L) 06/09/2021   MCV 88.3 06/09/2021   PLT 399 06/09/2021      Chemistry      Component Value Date/Time   NA 136 06/09/2021 1507   K 3.2 (L) 06/09/2021 1507   CL 95 (L) 06/09/2021 1507   CO2 28 06/09/2021 1507   BUN 17 06/09/2021 1507   CREATININE 0.69 06/09/2021 1507       Component Value Date/Time   CALCIUM 9.2 06/09/2021 1507   ALKPHOS 109 06/09/2021 1507   AST 14 (L) 06/09/2021 1507   ALT 11 06/09/2021 1507   BILITOT 0.5 06/09/2021 1507       RADIOGRAPHIC STUDIES:  No results found.   ASSESSMENT/PLAN:  This is a very pleasant 76 year old African-American female diagnosed with stage IV (T3, N2, M1 C) non-small cell lung cancer, adenocarcinoma.  She presented with a left lower lobe lung mass in addition to subcarinal adenopathy.  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  She was diagnosed in April 2023.  Her molecular studies show that she is positive for an EGFR mutation with deletion in exon 19 in addition to T790 and.  Her PD-L1 expression is 50 percent.  Therefore the patient is currently undergoing targeted treatment with Tagrisso 80 mg p.o. daily rather than immunotherapy.  Her first dose of treatment was on 05/22/21.  She is tolerating this well.  Dr. Julien Nordmann discussed with the patient that her lightheadedness is likely not secondary to her Newman Nip and is likely due to her poor nutrition.  She was prescribed Remeron.  She was also referred to the dietitian who she is scheduled to see on ***  The patient was seen with Dr. Julien Nordmann today.  Labs were reviewed.  Recommend that she ***on the same treatment at the same dose.  I will arrange for restaging CT scan of the chest, abdomen, and pelvis in 4 weeks.  Physical therapy?  Eliquis?  For the thrombus that was noted on the brain MRI from 04/19/2021.  The patient was advised to call immediately if she has any concerning symptoms in the interval. The patient voices understanding of current disease status and treatment options and is in agreement with the current care plan. All questions were answered. The patient knows to call the clinic with any problems, questions or concerns. We can certainly see the patient much sooner if necessary\         No orders  of the defined types were placed in this encounter.    I spent {CHL ONC TIME VISIT - WCBJS:[id number]} counseling the patient face to face. The total time spent in the appointment was {CHL ONC TIME VISIT - WVPXT:[id number]}.  Anna Finley L Alex Leahy, PA-C 06/24/21

## 2021-06-28 NOTE — Progress Notes (Addendum)
  Radiation Oncology         ([phone]) [phone] ________________________________  Name: Anna Finley MRN: [id number]  Date of Service: 06/21/2021  DOB: 14-Jan-1946  Post Treatment Telephone Note  Diagnosis:   Stage IV NSCLC, adenocarcinoma of the RLL  Intent: Palliative  Radiation Treatment Dates: 04/21/2021 through 05/05/2021 Site Technique Total Dose (Gy) Dose per Fx (Gy) Completed Fx Beam Energies  Lung, Right: Lung_R 3D 30/30 3 10/10 6X, 10X, 15X  Thoracic Spine: Spine_T3 3D 30/30 3 10/10 10X, 15X   Narrative: The patient tolerated radiation therapy relatively well. She was able to take a few steps toward the conclusion of radiation and had a steroid taper instruction. Her daughter Peter Congo took the call and states she's doing well.    Impression/Plan: 1. Stage IV NSCLC, adenocarcinoma of the RLL. The patient has been doing well since completion of radiotherapy. We discussed that we would be happy to continue to follow her as needed, but she will also continue to follow up with Dr. Julien Nordmann in medical oncology.       Carola Rhine, PAC

## 2021-06-29 ENCOUNTER — Other Ambulatory Visit: Payer: Medicare Other

## 2021-06-29 ENCOUNTER — Ambulatory Visit: Payer: Medicare Other | Admitting: Internal Medicine

## 2021-06-29 ENCOUNTER — Encounter: Payer: Medicare Other | Admitting: Nutrition

## 2021-06-29 ENCOUNTER — Inpatient Hospital Stay: Payer: Medicare Other | Admitting: Physician Assistant

## 2021-06-29 ENCOUNTER — Inpatient Hospital Stay: Payer: Medicare Other | Attending: Physician Assistant

## 2021-06-29 DIAGNOSIS — C3431 Malignant neoplasm of lower lobe, right bronchus or lung: Secondary | ICD-10-CM | POA: Insufficient documentation

## 2021-06-29 DIAGNOSIS — Z86718 Personal history of other venous thrombosis and embolism: Secondary | ICD-10-CM | POA: Insufficient documentation

## 2021-06-29 DIAGNOSIS — Z79899 Other long term (current) drug therapy: Secondary | ICD-10-CM | POA: Insufficient documentation

## 2021-06-29 DIAGNOSIS — Z923 Personal history of irradiation: Secondary | ICD-10-CM | POA: Insufficient documentation

## 2021-06-29 DIAGNOSIS — R630 Anorexia: Secondary | ICD-10-CM | POA: Insufficient documentation

## 2021-06-29 DIAGNOSIS — Z7901 Long term (current) use of anticoagulants: Secondary | ICD-10-CM | POA: Insufficient documentation

## 2021-06-29 DIAGNOSIS — C7951 Secondary malignant neoplasm of bone: Secondary | ICD-10-CM | POA: Insufficient documentation

## 2021-06-30 NOTE — Progress Notes (Deleted)
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 Lisbeth Renshaw  CURRENT THERAPY: Tagrisso 80 mg p.o. daily.  First dose on ~5/6  INTERVAL HISTORY: Anna Finley 76 y.o. female returns returns to clinic today for follow-up visit accompanied by her daughter.  The patient is feeling fair today.  The patient was recently diagnosed with stage IV lung cancer.  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  She completed radiation to T3.  At her last appointment, we referred her to physical therapy.  This has *** .  Her weakness is ***and she is a little more ***active?  The patient is currently undergoing targeted treatment with Tagrisso due to her EGFR mutation.  Thus far she is tolerating this fairly well.  At her last appointment she thought that Tagrisso made her feel "funny".  When asked to elaborate she reports lightheadedness.  Of note the patient has poor nutrition.  We referred her to a dietitian.  I also prescribed her Remeron as well.  Her appetite has ***since she was last seen.  Weight?  Otherwise she denies any new concerning complaints today.  Denies any fever, chills, or night sweats.  Denies any chest pain or mops this.  Denies any nausea, vomiting, diarrhea, or constipation.  She reports dyspnea on  exertion which she thinks is improved compared to prior.  She has a mild cough but not enough to warrant taking any cough medication.  Discussed with her last appointment that she can take Delsym or Robitussin if needed.  She has mild headaches which improves with Tylenol.  The patient had a staging brain MRI which is negative for metastatic disease to the brain of note.  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 she is here today for evaluation and repeat blood work.  MEDICAL HISTORY: Past Medical History:  Diagnosis Date   COPD (chronic obstructive pulmonary disease) with chronic bronchitis (HCC)    HTN (hypertension)    Pneumothorax     ALLERGIES:  has No Known Allergies.  MEDICATIONS:  Current Outpatient Medications  Medication Sig Dispense Refill   acetaminophen (TYLENOL) 500 MG tablet Take 500 mg by mouth every 6 (six) hours as needed for moderate pain or headache.     apixaban (ELIQUIS) 5 MG TABS tablet Take 1 tablet (5 mg total) by mouth 2 (two) times daily. Follow up with Dr. Julien Nordmann for additional refills 60 tablet 1   atenolol (TENORMIN) 50 MG tablet Take 1 tablet (50 mg total) by mouth daily. 30 tablet 0   atenolol-chlorthalidone (TENORETIC) 50-25 MG tablet Take 1 tablet by mouth daily.     atorvastatin (LIPITOR) 40 MG tablet Take 40 mg by mouth daily.     dexamethasone (DECADRON) 4 MG tablet See Taper instruction sheet 30 tablet 1   gabapentin (NEURONTIN) 300 MG capsule Take 300 mg by mouth 2 (two) times daily.  metFORMIN (GLUCOPHAGE) 500 MG tablet Take 500 mg by mouth daily.     mirtazapine (REMERON) 15 MG tablet Take 1 tablet (15 mg total) by mouth at bedtime. 30 tablet 2   montelukast (SINGULAIR) 10 MG tablet Take 10 mg by mouth at bedtime.     osimertinib mesylate (TAGRISSO) 80 MG tablet Take 1 tablet (80 mg total) by mouth daily. 30 tablet 3   polyethylene glycol (MIRALAX / GLYCOLAX) 17 g packet Take 17 g by mouth daily as needed for mild  constipation. Meds to bed, thanks 14 each 0   potassium chloride SA (KLOR-CON M) 20 MEQ tablet Take 1 tablet (20 mEq total) by mouth daily. 5 tablet 0   promethazine-dextromethorphan (PROMETHAZINE-DM) 6.25-15 MG/5ML syrup Take 5 mLs by mouth 4 (four) times daily as needed for cough.     WIXELA INHUB 250-50 MCG/ACT AEPB 1 puff 2 (two) times daily.     No current facility-administered medications for this visit.    SURGICAL HISTORY:  Past Surgical History:  Procedure Laterality Date   BRONCHIAL NEEDLE ASPIRATION BIOPSY  04/20/2021   Procedure: BRONCHIAL NEEDLE ASPIRATION BIOPSIES;  Surgeon: Candee Furbish, MD;  Location: [facility];  Service: Endoscopy;;   BRONCHIAL WASHINGS  04/20/2021   Procedure: BRONCHIAL WASHINGS;  Surgeon: Candee Furbish, MD;  Location: [facility];  Service: Endoscopy;;   ENDOBRONCHIAL ULTRASOUND N/A 04/20/2021   Procedure: ENDOBRONCHIAL ULTRASOUND;  Surgeon: Candee Furbish, MD;  Location: [facility];  Service: Endoscopy;  Laterality: N/A;   HIP ARTHROPLASTY     VIDEO BRONCHOSCOPY Left 04/20/2021   Procedure: VIDEO BRONCHOSCOPY WITH FLUORO;  Surgeon: Candee Furbish, MD;  Location: [facility];  Service: Endoscopy;  Laterality: Left;  EBUS    REVIEW OF SYSTEMS:   Review of Systems  Constitutional: Negative for appetite change, chills, fatigue, fever and unexpected weight change.  HENT:   Negative for mouth sores, nosebleeds, sore throat and trouble swallowing.   Eyes: Negative for eye problems and icterus.  Respiratory: Negative for cough, hemoptysis, shortness of breath and wheezing.   Cardiovascular: Negative for chest pain and leg swelling.  Gastrointestinal: Negative for abdominal pain, constipation, diarrhea, nausea and vomiting.  Genitourinary: Negative for bladder incontinence, difficulty urinating, dysuria, frequency and hematuria.   Musculoskeletal: Negative for back pain, gait problem, neck pain and neck stiffness.  Skin: Negative for itching and rash.   Neurological: Negative for dizziness, extremity weakness, gait problem, headaches, light-headedness and seizures.  Hematological: Negative for adenopathy. Does not bruise/bleed easily.  Psychiatric/Behavioral: Negative for confusion, depression and sleep disturbance. The patient is not nervous/anxious.     PHYSICAL EXAMINATION:  There were no vitals taken for this visit.  ECOG PERFORMANCE STATUS: {CHL ONC ECOG [id number]  Physical Exam  Constitutional: Oriented to person, place, and time and well-developed, well-nourished, and in no distress. No distress.  HENT:  Head: Normocephalic and atraumatic.  Mouth/Throat: Oropharynx is clear and moist. No oropharyngeal exudate.  Eyes: Conjunctivae are normal. Right eye exhibits no discharge. Left eye exhibits no discharge. No scleral icterus.  Neck: Normal range of motion. Neck supple.  Cardiovascular: Normal rate, regular rhythm, normal heart sounds and intact distal pulses.   Pulmonary/Chest: Effort normal and breath sounds normal. No respiratory distress. No wheezes. No rales.  Abdominal: Soft. Bowel sounds are normal. Exhibits no distension and no mass. There is no tenderness.  Musculoskeletal: Normal range of motion. Exhibits no edema.  Lymphadenopathy:    No cervical adenopathy.  Neurological: Alert and oriented to person,  place, and time. Exhibits normal muscle tone. Gait normal. Coordination normal.  Skin: Skin is warm and dry. No rash noted. Not diaphoretic. No erythema. No pallor.  Psychiatric: Mood, memory and judgment normal.  Vitals reviewed.  LABORATORY DATA: Lab Results  Component Value Date   WBC 11.7 (H) 06/09/2021   HGB 11.7 (L) 06/09/2021   HCT 35.3 (L) 06/09/2021   MCV 88.3 06/09/2021   PLT 399 06/09/2021      Chemistry      Component Value Date/Time   NA 136 06/09/2021 1507   K 3.2 (L) 06/09/2021 1507   CL 95 (L) 06/09/2021 1507   CO2 28 06/09/2021 1507   BUN 17 06/09/2021 1507   CREATININE 0.69  06/09/2021 1507      Component Value Date/Time   CALCIUM 9.2 06/09/2021 1507   ALKPHOS 109 06/09/2021 1507   AST 14 (L) 06/09/2021 1507   ALT 11 06/09/2021 1507   BILITOT 0.5 06/09/2021 1507       RADIOGRAPHIC STUDIES:  No results found.   ASSESSMENT/PLAN:  This is a very pleasant 76 year old African-American female diagnosed with stage IV (T3, N2, M1 C) non-small cell lung cancer, adenocarcinoma.  She presented with a left lower lobe lung mass in addition to subcarinal adenopathy.  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  She was diagnosed in April 2023.  Her molecular studies show that she is positive for an EGFR mutation with deletion in exon 19 in addition to T790 and.  Her PD-L1 expression is 50 percent.  Therefore the patient is currently undergoing targeted treatment with Tagrisso 80 mg p.o. daily rather than immunotherapy.  Her first dose of treatment was on 05/22/21.  She is tolerating this well.  Dr. Julien Nordmann discussed with the patient that her lightheadedness is likely not secondary to her Newman Nip and is likely due to her poor nutrition.  She was prescribed Remeron.  She was also referred to the dietitian who she is scheduled to see on ***  The patient was seen with Dr. Julien Nordmann today.  Labs were reviewed.  Recommend that she ***on the same treatment at the same dose.  I will arrange for restaging CT scan of the chest, abdomen, and pelvis in 4 weeks.  Physical therapy?  Eliquis?  For the thrombus that was noted on the brain MRI from 04/19/2021.  The patient was advised to call immediately if she has any concerning symptoms in the interval. The patient voices understanding of current disease status and treatment options and is in agreement with the current care plan. All questions were answered. The patient knows to call the clinic with any problems, questions or concerns. We can certainly see the patient much sooner if  necessary\   No orders of the defined types were placed in this encounter.    I spent {CHL ONC TIME VISIT - BRAXE:[id number]} counseling the patient face to face. The total time spent in the appointment was {CHL ONC TIME VISIT - JSRPR:[id number]}.  Anna Cogliano L Talesha Ellithorpe, PA-C 06/30/21

## 2021-07-01 ENCOUNTER — Inpatient Hospital Stay (HOSPITAL_BASED_OUTPATIENT_CLINIC_OR_DEPARTMENT_OTHER): Payer: Medicare Other | Admitting: Physician Assistant

## 2021-07-01 ENCOUNTER — Inpatient Hospital Stay: Payer: Medicare Other | Admitting: Nutrition

## 2021-07-01 ENCOUNTER — Inpatient Hospital Stay: Payer: Medicare Other

## 2021-07-01 ENCOUNTER — Other Ambulatory Visit: Payer: Self-pay

## 2021-07-01 VITALS — BP 125/71 | HR 99 | Temp 97.7°F | Resp 18

## 2021-07-01 DIAGNOSIS — C3431 Malignant neoplasm of lower lobe, right bronchus or lung: Secondary | ICD-10-CM

## 2021-07-01 DIAGNOSIS — Z79899 Other long term (current) drug therapy: Secondary | ICD-10-CM | POA: Diagnosis not present

## 2021-07-01 DIAGNOSIS — Z86718 Personal history of other venous thrombosis and embolism: Secondary | ICD-10-CM | POA: Diagnosis not present

## 2021-07-01 DIAGNOSIS — Z7901 Long term (current) use of anticoagulants: Secondary | ICD-10-CM | POA: Diagnosis not present

## 2021-07-01 DIAGNOSIS — Z923 Personal history of irradiation: Secondary | ICD-10-CM | POA: Diagnosis not present

## 2021-07-01 DIAGNOSIS — R630 Anorexia: Secondary | ICD-10-CM | POA: Diagnosis not present

## 2021-07-01 DIAGNOSIS — C7951 Secondary malignant neoplasm of bone: Secondary | ICD-10-CM | POA: Diagnosis not present

## 2021-07-01 LAB — CMP (CANCER CENTER ONLY)
ALT: 12 U/L (ref 0–44)
AST: 15 U/L (ref 15–41)
Albumin: 3 g/dL — ABNORMAL LOW (ref 3.5–5.0)
Alkaline Phosphatase: 98 U/L (ref 38–126)
Anion gap: 7 (ref 5–15)
BUN: 8 mg/dL (ref 8–23)
CO2: 30 mmol/L (ref 22–32)
Calcium: 9 mg/dL (ref 8.9–10.3)
Chloride: 100 mmol/L (ref 98–111)
Creatinine: 0.6 mg/dL (ref 0.44–1.00)
GFR, Estimated: >60 mL/min (ref >60)
Glucose, Bld: 102 mg/dL — ABNORMAL HIGH (ref 70–99)
Potassium: 3.8 mmol/L (ref 3.5–5.1)
Sodium: 137 mmol/L (ref 135–145)
Total Bilirubin: 0.3 mg/dL (ref 0.3–1.2)
Total Protein: 6.9 g/dL (ref 6.5–8.1)

## 2021-07-01 LAB — CBC WITH DIFFERENTIAL (CANCER CENTER ONLY)
Abs Immature Granulocytes: 0.02 10*3/uL (ref 0.00–0.07)
Basophils Absolute: 0 10*3/uL (ref 0.0–0.1)
Basophils Relative: 1 %
Eosinophils Absolute: 0.1 10*3/uL (ref 0.0–0.5)
Eosinophils Relative: 2 %
HCT: 33 % — ABNORMAL LOW (ref 36.0–46.0)
Hemoglobin: 10.7 g/dL — ABNORMAL LOW (ref 12.0–15.0)
Immature Granulocytes: 0 %
Lymphocytes Relative: 15 %
Lymphs Abs: 1 10*3/uL (ref 0.7–4.0)
MCH: 28.5 pg (ref 26.0–34.0)
MCHC: 32.4 g/dL (ref 30.0–36.0)
MCV: 87.8 fL (ref 80.0–100.0)
Monocytes Absolute: 0.5 10*3/uL (ref 0.1–1.0)
Monocytes Relative: 8 %
Neutro Abs: 5 10*3/uL (ref 1.7–7.7)
Neutrophils Relative %: 74 %
Platelet Count: 438 10*3/uL — ABNORMAL HIGH (ref 150–400)
RBC: 3.76 MIL/uL — ABNORMAL LOW (ref 3.87–5.11)
RDW: 16 % — ABNORMAL HIGH (ref 11.5–15.5)
WBC Count: 6.6 10*3/uL (ref 4.0–10.5)
nRBC: 0 % (ref 0.0–0.2)

## 2021-07-01 NOTE — Progress Notes (Signed)
[facility] OFFICE PROGRESS NOTE  [name], MD [address] [place] [address]  DIAGNOSIS: Stage IV (T3, N2, M1 C) non-small cell lung cancer, adenocarcinoma presented with large right lower lobe lung mass in addition to subcarinal lymphadenopathy as well as bone metastasis diagnosed in April 2023   Molecular studies by foundation 1 showed EGFR T790M, Exon 19 deletion [id number]) AKT2- Aplification-equivocal ARID1A- [id number] RICTOR- amplification-equivocal   PDL1 Expression 50%  PRIOR THERAPY: Palliative radiotherapy to the metastatic T3 bone lesion under the care of Dr. MLisbeth Renshaw CURRENT THERAPY: Tagrisso 80 mg p.o. daily.  First dose on ~5/6. Status post 6 weeks of treatment.   INTERVAL HISTORY: Anna Scarber76y.o. female returns to clinic today for follow-up visit accompanied by her daughter.  The patient is feeling fair today.  The patient was recently diagnosed with stage IV lung cancer.  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  She completed radiation to T3.  At her last appointment, we referred her to physical therapy.  She is going to start this next month. The patient is able to move her legs. Her family assists her at home with pivoting for transfers. She is expected to get a wheelchair to help her get around July 5th. She is not able to stand on the scale due to the weakness/bearing weight; however, the patient and her family believe she is gaining weight. She reportedly has a good appetite. I sent her Remeron at her last appointment which they think is also helping.   The patient is currently undergoing targeted treatment with Tagrisso due to her EGFR mutation.  Thus, far she is tolerating this fairly well. I also prescribed her Remeron as well.   Otherwise she denies any new concerning complaints today.   Denies any fever, chills, or night sweats.  Denies any chest pain or hemoptysis.  Denies any nausea, vomiting,  or constipation. She had two bouts of diarrhea the last two days but attributes this to something she ate. She reports dyspnea on exertion which she thinks is improved compared to prior. She reports she has to rest and it improves. Denies cough. Denies headaches or vision changes. Denies rashes or skin changes. .  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 She is here today for evaluation and repeat blood work.  MEDICAL HISTORY: Past Medical History:  Diagnosis Date   COPD (chronic obstructive pulmonary disease) with chronic bronchitis (HCC)    HTN (hypertension)    Pneumothorax     ALLERGIES:  has No Known Allergies.  MEDICATIONS:  Current Outpatient Medications  Medication Sig Dispense Refill   acetaminophen (TYLENOL) 500 MG tablet Take 500 mg by mouth every 6 (six) hours as needed for moderate pain or headache.     apixaban (ELIQUIS) 5 MG TABS tablet Take 1 tablet (5 mg total) by mouth 2 (two) times daily. Follow up with Dr. MJulien Nordmannfor additional refills 60 tablet 1   atenolol (TENORMIN) 50 MG tablet Take 1 tablet (50 mg total) by mouth daily. 30 tablet 0   atenolol-chlorthalidone (TENORETIC) 50-25 MG tablet Take 1 tablet by mouth daily.     atorvastatin (LIPITOR) 40 MG tablet Take 40 mg by mouth daily.     dexamethasone (DECADRON) 4 MG tablet See Taper instruction sheet 30 tablet 1   gabapentin (NEURONTIN) 300 MG capsule Take  300 mg by mouth 2 (two) times daily.     metFORMIN (GLUCOPHAGE) 500 MG tablet Take 500 mg by mouth daily.     mirtazapine (REMERON) 15 MG tablet Take 1 tablet (15 mg total) by mouth at bedtime. 30 tablet 2   montelukast (SINGULAIR) 10 MG tablet Take 10 mg by mouth at bedtime.     osimertinib mesylate (TAGRISSO) 80 MG tablet Take 1 tablet (80 mg total) by mouth daily. 30 tablet 3   polyethylene glycol (MIRALAX / GLYCOLAX) 17 g packet  Take 17 g by mouth daily as needed for mild constipation. Meds to bed, thanks 14 each 0   potassium chloride SA (KLOR-CON M) 20 MEQ tablet Take 1 tablet (20 mEq total) by mouth daily. 5 tablet 0   promethazine-dextromethorphan (PROMETHAZINE-DM) 6.25-15 MG/5ML syrup Take 5 mLs by mouth 4 (four) times daily as needed for cough.     WIXELA INHUB 250-50 MCG/ACT AEPB 1 puff 2 (two) times daily.     No current facility-administered medications for this visit.    SURGICAL HISTORY:  Past Surgical History:  Procedure Laterality Date   BRONCHIAL NEEDLE ASPIRATION BIOPSY  04/20/2021   Procedure: BRONCHIAL NEEDLE ASPIRATION BIOPSIES;  Surgeon: Candee Furbish, MD;  Location: [facility];  Service: Endoscopy;;   BRONCHIAL WASHINGS  04/20/2021   Procedure: BRONCHIAL WASHINGS;  Surgeon: Candee Furbish, MD;  Location: [facility];  Service: Endoscopy;;   ENDOBRONCHIAL ULTRASOUND N/A 04/20/2021   Procedure: ENDOBRONCHIAL ULTRASOUND;  Surgeon: Candee Furbish, MD;  Location: [facility];  Service: Endoscopy;  Laterality: N/A;   HIP ARTHROPLASTY     VIDEO BRONCHOSCOPY Left 04/20/2021   Procedure: VIDEO BRONCHOSCOPY WITH FLUORO;  Surgeon: Candee Furbish, MD;  Location: [facility];  Service: Endoscopy;  Laterality: Left;  EBUS    REVIEW OF SYSTEMS:   Review of Systems  Constitutional: Positive for fatigue and generalized weakness. Negative for appetite change, chills,fever and unexpected weight change.  HENT:   Negative for mouth sores, nosebleeds, sore throat and trouble swallowing.   Eyes: Negative for eye problems and icterus.  Respiratory: Positive for dyspnea on exertion (improved compared to prior). Negative for cough, hemoptysis, shortness of breath and wheezing.   Cardiovascular: Negative for chest pain and leg swelling.  Gastrointestinal: Negative for abdominal pain, constipation, diarrhea, nausea and vomiting.  Genitourinary: Negative for bladder incontinence, difficulty urinating, dysuria,  frequency and hematuria.   Musculoskeletal: Negative for back pain, gait problem, neck pain and neck stiffness.  Skin: Negative for itching and rash.  Neurological: Negative for dizziness, extremity weakness, gait problem, headaches, light-headedness and seizures.  Hematological: Negative for adenopathy. Does not bruise/bleed easily.  Psychiatric/Behavioral: Negative for confusion, depression and sleep disturbance. The patient is not nervous/anxious.     PHYSICAL EXAMINATION:  Blood pressure 125/71, pulse 99, temperature 97.7 F (36.5 C), resp. rate 18, SpO2 97 %.  ECOG PERFORMANCE STATUS: 3  Physical Exam  Constitutional: Oriented to person, place, and time and cachectic appearing female and in no distress.  HENT:  Head: Normocephalic and atraumatic.  Mouth/Throat: Oropharynx is clear and moist. No oropharyngeal exudate.  Eyes: Conjunctivae are normal. Right eye exhibits no discharge. Left eye exhibits no discharge. No scleral icterus.  Neck: Normal range of motion. Neck supple.  Cardiovascular: Normal rate, regular rhythm, normal heart sounds and intact distal pulses.   Pulmonary/Chest: Effort normal.  Quiet breath sounds bilaterally.  No respiratory distress. No wheezes. No rales.  Abdominal: Soft. Bowel sounds are normal. Exhibits no distension  and no mass. There is no tenderness.  Musculoskeletal: Normal range of motion. Exhibits no edema.  Able to move her lower extremities in the wheelchair but unable to bear weight without assistance. Lymphadenopathy:    No cervical adenopathy.  Neurological: Alert and oriented to person, place, and time. Exhibits also wasting.  The patient was examined in the wheelchair today.  Skin: Skin is warm and dry. No rash noted. Not diaphoretic. No erythema. No pallor.  Psychiatric: Mood, memory and judgment normal.  Vitals reviewed.  LABORATORY DATA: Lab Results  Component Value Date   WBC 6.6 07/01/2021   HGB 10.7 (L) 07/01/2021   HCT 33.0 (L)  07/01/2021   MCV 87.8 07/01/2021   PLT 438 (H) 07/01/2021      Chemistry      Component Value Date/Time   NA 136 06/09/2021 1507   K 3.2 (L) 06/09/2021 1507   CL 95 (L) 06/09/2021 1507   CO2 28 06/09/2021 1507   BUN 17 06/09/2021 1507   CREATININE 0.69 06/09/2021 1507      Component Value Date/Time   CALCIUM 9.2 06/09/2021 1507   ALKPHOS 109 06/09/2021 1507   AST 14 (L) 06/09/2021 1507   ALT 11 06/09/2021 1507   BILITOT 0.5 06/09/2021 1507       RADIOGRAPHIC STUDIES:  No results found.   ASSESSMENT/PLAN:  This is a very pleasant 76 year old African-American female diagnosed with stage IV (T3, N2, M1 C) non-small cell lung cancer, adenocarcinoma.  She presented with a left lower lobe lung mass in addition to subcarinal adenopathy.  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  She was diagnosed in April 2023.  Her molecular studies show that she is positive for an EGFR mutation with deletion in exon 19 in addition to T790 and.  Her PD-L1 expression is 50 percent.  Therefore the patient is currently undergoing targeted treatment with Tagrisso 80 mg p.o. daily rather than immunotherapy.  Her first dose of treatment was on 05/22/21.  She is tolerating this well.    The patient was seen with Dr. Julien Nordmann today.  Labs were reviewed.  Recommend that she continue on the same treatment at the same dose.  I will arrange for restaging CT scan of the chest, abdomen, and pelvis in 4 weeks.  I relayed the patient's rescheduled appointment with nutritionist which is on 6/26.  I confirmed with the patient's daughter who took down this information.  She will continue taking Remeron for her decreased appetite.  The patient mentions that she has an improved appetite and although she was unable to stand on the scale today, it appears that she has gained weight prior to her last appointment.  The patient is going to obtain a wheelchair in a few weeks.  She is  also going to undergo physical therapy in a few weeks.  She will continue taking Eliquis for the thrombus that was noted on the brain MRI from 04/19/2021.  Once she has completed her next refill then she can discontinue taking this.  The patient has missed several appointments this week.  I discussed the importance of coming to the toxicity check appointments.  If the patient is unable to make appointments encouraged them to call the clinic.  Additionally am happy to write the patient's caregivers/daughters a work excuse note in order to accompany her to her appointments.  If she misses several toxicity check appointments, we will not be able to continue to refill her  Tagrisso.  The patient was advised to call immediately if she has any concerning symptoms in the interval. The patient voices understanding of current disease status and treatment options and is in agreement with the current care plan. All questions were answered. The patient knows to call the clinic with any problems, questions or concerns. We can certainly see the patient much sooner if necessary   Orders Placed This Encounter  Procedures   CT Chest W Contrast    Standing Status:   Future    Standing Expiration Date:   07/01/2022    Order Specific Question:   If indicated for the ordered procedure, I authorize the administration of contrast media per Radiology protocol    Answer:   Yes    Order Specific Question:   Preferred imaging location?    Answer:   [facility]   CT Abdomen Pelvis W Contrast    Standing Status:   Future    Standing Expiration Date:   07/01/2022    Order Specific Question:   If indicated for the ordered procedure, I authorize the administration of contrast media per Radiology protocol    Answer:   Yes    Order Specific Question:   Preferred imaging location?    Answer:   [facility]    Order Specific Question:   Is Oral Contrast requested for this exam?    Answer:   Yes, Per Radiology  protocol     Anna Rother L Shina Wass, PA-C 07/01/21  ADDENDUM: Hematology/Oncology Attending: I had a face-to-face encounter with the patient today.  I reviewed her records, lab and recommended her care plan.  This is a very pleasant 76 years old African-American female diagnosed with a stage IV (T3, N2, M1 C) non-small cell lung cancer, adenocarcinoma with positive EGFR mutation with deletion in exon 19 in addition to T790M mutation.  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 The patient is currently undergoing treatment with targeted therapy with Tagrisso 80 mg p.o. daily status post around 6 weeks of treatment. She has been tolerating this treatment well with no concerning adverse effects. I recommended for the patient to continue her current treatment with Tagrisso with the same dose. We will see her back for follow-up visit in 1 months for evaluation and repeat CT scan of the chest, abdomen and pelvis for restaging of her disease. For the lack of appetite and weight loss, she is currently on Remeron.  The patient also has appointment with the dietitian at the cancer center. She was advised to call immediately if she has any other concerning symptoms in the interval. The total time spent in the appointment was 20 minutes. Disclaimer: This note was dictated with voice recognition software. Similar sounding words can inadvertently be transcribed and may be missed upon review. Eilleen Kempf, MD

## 2021-07-02 LAB — TSH: TSH: 1 u[IU]/mL (ref 0.350–4.500)

## 2021-07-05 ENCOUNTER — Ambulatory Visit: Payer: Medicare Other | Admitting: Physician Assistant

## 2021-07-05 ENCOUNTER — Other Ambulatory Visit: Payer: Medicare Other

## 2021-07-05 ENCOUNTER — Other Ambulatory Visit (HOSPITAL_COMMUNITY): Payer: Self-pay

## 2021-07-06 ENCOUNTER — Encounter: Payer: Self-pay | Admitting: *Deleted

## 2021-07-06 NOTE — Progress Notes (Signed)
Patient is getting oral biologic for her tx plan.

## 2021-07-07 ENCOUNTER — Other Ambulatory Visit (HOSPITAL_COMMUNITY): Payer: Self-pay

## 2021-07-12 ENCOUNTER — Inpatient Hospital Stay: Payer: Medicare Other | Admitting: Nutrition

## 2021-07-12 ENCOUNTER — Telehealth: Payer: Self-pay | Admitting: Nutrition

## 2021-07-13 ENCOUNTER — Other Ambulatory Visit (HOSPITAL_COMMUNITY): Payer: Self-pay

## 2021-07-26 ENCOUNTER — Ambulatory Visit (HOSPITAL_COMMUNITY): Admission: RE | Admit: 2021-07-26 | Payer: Medicare Other | Source: Ambulatory Visit

## 2021-08-03 ENCOUNTER — Ambulatory Visit (HOSPITAL_COMMUNITY)
Admission: RE | Admit: 2021-08-03 | Discharge: 2021-08-03 | Disposition: A | Discharge status: Home / Self Care | Payer: Medicare Other | Source: Ambulatory Visit | Attending: Physician Assistant | Admitting: Physician Assistant

## 2021-08-03 ENCOUNTER — Other Ambulatory Visit (HOSPITAL_COMMUNITY): Payer: Self-pay

## 2021-08-03 DIAGNOSIS — C3431 Malignant neoplasm of lower lobe, right bronchus or lung: Secondary | ICD-10-CM | POA: Diagnosis present

## 2021-08-03 MED ORDER — IOHEXOL 300 MG/ML  SOLN
100.0000 mL | Freq: Once | INTRAMUSCULAR | Status: AC | PRN
  Administered 2021-08-03: 80 mL via INTRAVENOUS

## 2021-08-05 ENCOUNTER — Other Ambulatory Visit (HOSPITAL_COMMUNITY): Payer: Self-pay

## 2021-08-06 ENCOUNTER — Other Ambulatory Visit (HOSPITAL_COMMUNITY): Payer: Self-pay

## 2021-08-06 NOTE — Progress Notes (Deleted)
[name] OFFICE PROGRESS NOTE  [name], MD [address] [place]  [address]  DIAGNOSIS: Stage IV (T3, N2, M1 C) non-small cell lung cancer, adenocarcinoma presented with large right lower lobe lung mass in addition to subcarinal lymphadenopathy as well as bone metastasis diagnosed in April 2023   Molecular studies by foundation 1 showed EGFR T790M, Exon 19 deletion [id number]) AKT2- Aplification-equivocal ARID1A- [id number]*1 RICTOR- amplification-equivocal   PDL1 Expression 50%  PRIOR THERAPY: Palliative radiotherapy to the metastatic T3 bone lesion under the care of Dr. Lisbeth Renshaw  CURRENT THERAPY: Tagrisso 80 mg p.o. daily.  First dose on ~5/6. Status post *** weeks of treatment.   INTERVAL HISTORY: Anna Finley 76 y.o. female returns to the clinic today for follow-up visit accompanied by ***.  The patient is feeling fairly well today.  The patient was diagnosed with stage IV lung cancer in April 2023.  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  She completed radiation to T3.  She is undergoing physical therapy at this time and her weakness is ***.   She has also been gaining weight.  I sent her prescription for Remeron which has been helping.  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  Has been on this for over 2 months and tolerating this well.  She denies any fever, chills, or night sweats. Denies any chest pain or hemoptysis.  Shortness of breath and cough?  Denies any nausea, vomiting,  or constipation.  Diarrhea?  She denies any headache or visual changes.  Denies any rashes or skin changes.  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  She recently had a restaging CT scan performed.  She is here today for evaluation and to review her scan results.    MEDICAL  HISTORY: Past Medical History:  Diagnosis Date   COPD (chronic obstructive pulmonary disease) with chronic bronchitis (HCC)    HTN (hypertension)    Pneumothorax     ALLERGIES:  has No Known Allergies.  MEDICATIONS:  Current Outpatient Medications  Medication Sig Dispense Refill   acetaminophen (TYLENOL) 500 MG tablet Take 500 mg by mouth every 6 (six) hours as needed for moderate pain or headache.     apixaban (ELIQUIS) 5 MG TABS tablet Take 1 tablet (5 mg total) by mouth 2 (two) times daily. Follow up with Dr. Julien Nordmann for additional refills 60 tablet 1   atenolol (TENORMIN) 50 MG tablet Take 1 tablet (50 mg total) by mouth daily. 30 tablet 0   atenolol-chlorthalidone (TENORETIC) 50-25 MG tablet Take 1 tablet by mouth daily.     atorvastatin (LIPITOR) 40 MG tablet Take 40 mg by mouth daily.     dexamethasone (DECADRON) 4 MG tablet See Taper instruction sheet 30 tablet 1   gabapentin (NEURONTIN) 300 MG capsule Take 300 mg by mouth 2 (two) times daily.     metFORMIN (GLUCOPHAGE) 500 MG tablet Take 500 mg by mouth daily.     mirtazapine (REMERON) 15 MG tablet Take 1 tablet (15 mg total) by mouth at bedtime. 30 tablet 2   montelukast (SINGULAIR) 10 MG tablet Take 10 mg by mouth at bedtime.     osimertinib mesylate (TAGRISSO) 80 MG tablet Take 1 tablet (80 mg total) by mouth daily. 30 tablet 3   polyethylene glycol (MIRALAX / GLYCOLAX) 17 g packet Take  17 g by mouth daily as needed for mild constipation. Meds to bed, thanks 14 each 0   potassium chloride SA (KLOR-CON M) 20 MEQ tablet Take 1 tablet (20 mEq total) by mouth daily. 5 tablet 0   promethazine-dextromethorphan (PROMETHAZINE-DM) 6.25-15 MG/5ML syrup Take 5 mLs by mouth 4 (four) times daily as needed for cough.     WIXELA INHUB 250-50 MCG/ACT AEPB 1 puff 2 (two) times daily.     No current facility-administered medications for this visit.    SURGICAL HISTORY:  Past Surgical History:  Procedure Laterality Date   BRONCHIAL NEEDLE  ASPIRATION BIOPSY  04/20/2021   Procedure: BRONCHIAL NEEDLE ASPIRATION BIOPSIES;  Surgeon: Candee Furbish, MD;  Location: [facility];  Service: Endoscopy;;   BRONCHIAL WASHINGS  04/20/2021   Procedure: BRONCHIAL WASHINGS;  Surgeon: Candee Furbish, MD;  Location: [facility];  Service: Endoscopy;;   ENDOBRONCHIAL ULTRASOUND N/A 04/20/2021   Procedure: ENDOBRONCHIAL ULTRASOUND;  Surgeon: Candee Furbish, MD;  Location: [facility];  Service: Endoscopy;  Laterality: N/A;   HIP ARTHROPLASTY     VIDEO BRONCHOSCOPY Left 04/20/2021   Procedure: VIDEO BRONCHOSCOPY WITH FLUORO;  Surgeon: Candee Furbish, MD;  Location: [facility];  Service: Endoscopy;  Laterality: Left;  EBUS    REVIEW OF SYSTEMS:   Review of Systems  Constitutional: Negative for appetite change, chills, fatigue, fever and unexpected weight change.  HENT:   Negative for mouth sores, nosebleeds, sore throat and trouble swallowing.   Eyes: Negative for eye problems and icterus.  Respiratory: Negative for cough, hemoptysis, shortness of breath and wheezing.   Cardiovascular: Negative for chest pain and leg swelling.  Gastrointestinal: Negative for abdominal pain, constipation, diarrhea, nausea and vomiting.  Genitourinary: Negative for bladder incontinence, difficulty urinating, dysuria, frequency and hematuria.   Musculoskeletal: Negative for back pain, gait problem, neck pain and neck stiffness.  Skin: Negative for itching and rash.  Neurological: Negative for dizziness, extremity weakness, gait problem, headaches, light-headedness and seizures.  Hematological: Negative for adenopathy. Does not bruise/bleed easily.  Psychiatric/Behavioral: Negative for confusion, depression and sleep disturbance. The patient is not nervous/anxious.     PHYSICAL EXAMINATION:  There were no vitals taken for this visit.  ECOG PERFORMANCE STATUS: {CHL ONC ECOG [id number]  Physical Exam  Constitutional: Oriented to person, place, and time and  well-developed, well-nourished, and in no distress. No distress.  HENT:  Head: Normocephalic and atraumatic.  Mouth/Throat: Oropharynx is clear and moist. No oropharyngeal exudate.  Eyes: Conjunctivae are normal. Right eye exhibits no discharge. Left eye exhibits no discharge. No scleral icterus.  Neck: Normal range of motion. Neck supple.  Cardiovascular: Normal rate, regular rhythm, normal heart sounds and intact distal pulses.   Pulmonary/Chest: Effort normal and breath sounds normal. No respiratory distress. No wheezes. No rales.  Abdominal: Soft. Bowel sounds are normal. Exhibits no distension and no mass. There is no tenderness.  Musculoskeletal: Normal range of motion. Exhibits no edema.  Lymphadenopathy:    No cervical adenopathy.  Neurological: Alert and oriented to person, place, and time. Exhibits normal muscle tone. Gait normal. Coordination normal.  Skin: Skin is warm and dry. No rash noted. Not diaphoretic. No erythema. No pallor.  Psychiatric: Mood, memory and judgment normal.  Vitals reviewed.  LABORATORY DATA: Lab Results  Component Value Date   WBC 6.6 07/01/2021   HGB 10.7 (L) 07/01/2021   HCT 33.0 (L) 07/01/2021   MCV 87.8 07/01/2021   PLT 438 (H) 07/01/2021      Chemistry  Component Value Date/Time   NA 137 07/01/2021 1549   K 3.8 07/01/2021 1549   CL 100 07/01/2021 1549   CO2 30 07/01/2021 1549   BUN 8 07/01/2021 1549   CREATININE 0.60 07/01/2021 1549      Component Value Date/Time   CALCIUM 9.0 07/01/2021 1549   ALKPHOS 98 07/01/2021 1549   AST 15 07/01/2021 1549   ALT 12 07/01/2021 1549   BILITOT 0.3 07/01/2021 1549       RADIOGRAPHIC STUDIES:  CT Chest W Contrast  Result Date: 08/04/2021 CLINICAL DATA:  Primary Cancer Type: Lung Imaging Indication: Assess response to therapy Interval therapy since last imaging? Yes Initial Cancer Diagnosis Date: 04/20/2021; Established by: Biopsy-proven Detailed Pathology: Stage IV non-small cell lung  cancer, adenocarcinoma. Primary Tumor location: Right lower lobe. Metastatic bone lesions in the spine and pelvis and the right adrenal gland. Surgeries: Left hip arthroplasty. Chemotherapy: Yes; Ongoing? Yes; Tagrisso daily Immunotherapy? No Radiation therapy? Yes; Date Range: 04/21/2021 - 05/05/2021; Target: T3 bone lesion and right lung * Tracking Code: BO * EXAM: CT CHEST, ABDOMEN, AND PELVIS WITH CONTRAST TECHNIQUE: Multidetector CT imaging of the chest, abdomen and pelvis was performed following the standard protocol during bolus administration of intravenous contrast. RADIATION DOSE REDUCTION: This exam was performed according to the departmental dose-optimization program which includes automated exposure control, adjustment of the mA and/or kV according to patient size and/or use of iterative reconstruction technique. CONTRAST:  32mL OMNIPAQUE IOHEXOL 300 MG/ML  SOLN COMPARISON:  04/29/2021 PET-CT. Most recent CT chest 03/26/2021. Abdominopelvic CT 05/06/2021. FINDINGS: Cardiovascular: No acute vascular findings are evident. There is atherosclerosis of the aorta, great vessels and coronary arteries. The heart size is normal. There is no pericardial effusion. Mediastinum/Nodes: Hypermetabolic subcarinal node on PET-CT currently measures 9 mm on image 25/2 (previously 12 mm). There are no enlarged mediastinal, hilar or axillary lymph nodes. A small hiatal hernia with a paraesophageal component and distal esophageal wall thickening are unchanged. No abnormality of the trachea or thyroid gland is seen. Lungs/Pleura: Trace pleural fluid on the right. No pneumothorax. Moderate centrilobular and paraseptal emphysema. Significant improvement in the previously demonstrated lobulated mass in the right lower lobe, currently measuring 2.6 x 2.3 cm on image 73/6 (previously 6.3 x 3.9 cm). The previously demonstrated soft tissue component contiguous with the hilum is no longer seen. There is persistent occlusion of a  peripheral right lower lobe bronchus. There are increased patchy airspace opacities throughout the right lower and middle lobes as well as the medial aspect of the right upper lobe, attributed to interval radiation therapy. The left lower lobe collapse seen on PET-CT has resolved, and the left lung is clear. No new or enlarging nodules are identified. Musculoskeletal/Chest wall: Previously demonstrated T3 fracture demonstrates progressive collapse with more than 50% loss of vertebral body height and mild osseous retropulsion. There is increased sclerosis throughout the T3 vertebral body. There are new sclerotic lesions throughout the spine, most notable at T5 and T12, most consistent with treated osseous metastases. There is a healing fracture of the left 2nd rib laterally which demonstrates no definite pathologic features. No epidural tumor or significant spinal canal compromise identified. Bilateral glenohumeral arthropathy. CT ABDOMEN AND PELVIS FINDINGS Hepatobiliary: The liver is normal in density without suspicious focal abnormality. Small low-density hepatic lesions are unchanged, likely cysts. No evidence of gallstones, gallbladder wall thickening or biliary dilatation. Pancreas: Unremarkable. No pancreatic ductal dilatation or surrounding inflammatory changes. Spleen: Normal in size without focal abnormality. Adrenals/Urinary Tract: Previously  demonstrated right adrenal nodule which was hypermetabolic on PET-CT has decreased in size and become less well-defined, measuring approximately 1.7 x 0.7 cm on image 50/2 (previously 2.3 x 1.2 cm). The left adrenal gland appears normal. Both kidneys demonstrate cortical scarring. No evidence of renal mass or hydronephrosis. The bladder appears normal for its degree of distention. Stomach/Bowel: Enteric contrast was administered and has passed into the mid colon. As above, stable small hiatal hernia which has a paraesophageal component. There are diverticular changes  throughout the colon without suspicious bowel wall thickening, distention or surrounding inflammation. The appendix appears normal. Vascular/Lymphatic: There are no enlarged abdominal or pelvic lymph nodes. Aortic and branch vessel atherosclerosis without aneurysm or large vessel occlusion. Reproductive: Multiple calcified uterine fibroids are noted. No suspicious adnexal findings. Other: No ascites or peritoneal nodularity. Musculoskeletal: Interval sclerosis of previously demonstrated metastasis within the L4 vertebral body. Interval sclerosis of several previously demonstrated metastases within the bony pelvis. No pathologic fracture. 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 Previous left total hip arthroplasty. IMPRESSION: 1. Interval response to treatment. The dominant right lower lobe mass has decreased in size, and there is improvement in the previously demonstrated right hilar and mediastinal adenopathy and right adrenal metastasis. No disease progression identified. 2. Previously demonstrated multifocal osseous metastatic disease shows progressive sclerosis consistent with response to therapy. Pathologic fracture at T3 has progressed with mild osseous retropulsion, but no gross spinal canal compromise. 3. Probable radiation changes in the right lung. Left lower lobe collapse demonstrated on PET-CT has resolved. 4. Stable incidental findings including a hiatal hernia with a paraseptal component. Coronary and aortic atherosclerosis (ICD10-I70.0). Emphysema (ICD10-J43.9). Electronically Signed   By: Richardean Sale M.D.   On: 08/04/2021 12:13   CT Abdomen Pelvis W Contrast  Result Date: 08/04/2021 CLINICAL DATA:  Primary Cancer Type: Lung Imaging Indication: Assess response to therapy Interval therapy since last imaging? Yes Initial Cancer Diagnosis Date: 04/20/2021; Established by: Biopsy-proven Detailed Pathology: Stage IV  non-small cell lung cancer, adenocarcinoma. Primary Tumor location: Right lower lobe. Metastatic bone lesions in the spine and pelvis and the right adrenal gland. Surgeries: Left hip arthroplasty. Chemotherapy: Yes; Ongoing? Yes; Tagrisso daily Immunotherapy? No Radiation therapy? Yes; Date Range: 04/21/2021 - 05/05/2021; Target: T3 bone lesion and right lung * Tracking Code: BO * EXAM: CT CHEST, ABDOMEN, AND PELVIS WITH CONTRAST TECHNIQUE: Multidetector CT imaging of the chest, abdomen and pelvis was performed following the standard protocol during bolus administration of intravenous contrast. RADIATION DOSE REDUCTION: This exam was performed according to the departmental dose-optimization program which includes automated exposure control, adjustment of the mA and/or kV according to patient size and/or use of iterative reconstruction technique. CONTRAST:  34mL OMNIPAQUE IOHEXOL 300 MG/ML  SOLN COMPARISON:  04/29/2021 PET-CT. Most recent CT chest 03/26/2021. Abdominopelvic CT 05/06/2021. FINDINGS: Cardiovascular: No acute vascular findings are evident. There is atherosclerosis of the aorta, great vessels and coronary arteries. The heart size is normal. There is no pericardial effusion. Mediastinum/Nodes: Hypermetabolic subcarinal node on PET-CT currently measures 9 mm on image 25/2 (previously 12 mm). There are no enlarged mediastinal, hilar or axillary lymph nodes. A small hiatal hernia with a paraesophageal component and distal esophageal wall thickening are unchanged. No abnormality of the trachea or thyroid gland is seen. Lungs/Pleura: Trace pleural fluid on the right. No pneumothorax. Moderate centrilobular and paraseptal emphysema. Significant improvement in the previously demonstrated lobulated mass in the right lower lobe, currently measuring 2.6  x 2.3 cm on image 73/6 (previously 6.3 x 3.9 cm). The previously demonstrated soft tissue component contiguous with the hilum is no longer seen. There is persistent  occlusion of a peripheral right lower lobe bronchus. There are increased patchy airspace opacities throughout the right lower and middle lobes as well as the medial aspect of the right upper lobe, attributed to interval radiation therapy. The left lower lobe collapse seen on PET-CT has resolved, and the left lung is clear. No new or enlarging nodules are identified. Musculoskeletal/Chest wall: Previously demonstrated T3 fracture demonstrates progressive collapse with more than 50% loss of vertebral body height and mild osseous retropulsion. There is increased sclerosis throughout the T3 vertebral body. There are new sclerotic lesions throughout the spine, most notable at T5 and T12, most consistent with treated osseous metastases. There is a healing fracture of the left 2nd rib laterally which demonstrates no definite pathologic features. No epidural tumor or significant spinal canal compromise identified. Bilateral glenohumeral arthropathy. CT ABDOMEN AND PELVIS FINDINGS Hepatobiliary: The liver is normal in density without suspicious focal abnormality. Small low-density hepatic lesions are unchanged, likely cysts. No evidence of gallstones, gallbladder wall thickening or biliary dilatation. Pancreas: Unremarkable. No pancreatic ductal dilatation or surrounding inflammatory changes. Spleen: Normal in size without focal abnormality. Adrenals/Urinary Tract: Previously demonstrated right adrenal nodule which was hypermetabolic on PET-CT has decreased in size and become less well-defined, measuring approximately 1.7 x 0.7 cm on image 50/2 (previously 2.3 x 1.2 cm). The left adrenal gland appears normal. Both kidneys demonstrate cortical scarring. No evidence of renal mass or hydronephrosis. The bladder appears normal for its degree of distention. Stomach/Bowel: Enteric contrast was administered and has passed into the mid colon. As above, stable small hiatal hernia which has a paraesophageal component. There are  diverticular changes throughout the colon without suspicious bowel wall thickening, distention or surrounding inflammation. The appendix appears normal. Vascular/Lymphatic: There are no enlarged abdominal or pelvic lymph nodes. Aortic and branch vessel atherosclerosis without aneurysm or large vessel occlusion. Reproductive: Multiple calcified uterine fibroids are noted. No suspicious adnexal findings. Other: No ascites or peritoneal nodularity. Musculoskeletal: Interval sclerosis of previously demonstrated metastasis within the L4 vertebral body. Interval sclerosis of several previously demonstrated metastases within the bony pelvis. No pathologic fracture. 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 Previous left total hip arthroplasty. IMPRESSION: 1. Interval response to treatment. The dominant right lower lobe mass has decreased in size, and there is improvement in the previously demonstrated right hilar and mediastinal adenopathy and right adrenal metastasis. No disease progression identified. 2. Previously demonstrated multifocal osseous metastatic disease shows progressive sclerosis consistent with response to therapy. Pathologic fracture at T3 has progressed with mild osseous retropulsion, but no gross spinal canal compromise. 3. Probable radiation changes in the right lung. Left lower lobe collapse demonstrated on PET-CT has resolved. 4. Stable incidental findings including a hiatal hernia with a paraseptal component. Coronary and aortic atherosclerosis (ICD10-I70.0). Emphysema (ICD10-J43.9). Electronically Signed   By: Richardean Sale M.D.   On: 08/04/2021 12:13     ASSESSMENT/PLAN:  This is a very pleasant 76 year old African-American female diagnosed with stage IV (T3, N2, M1 C) non-small cell lung cancer, adenocarcinoma.  She presented with a left lower lobe lung mass in addition to subcarinal adenopathy.  She also has  metastatic disease to the right adrenal gland and bone metastases.  She was diagnosed in April 2023.  Her molecular studies show that she  is positive for an EGFR mutation with deletion in exon 19 in addition to T790 and.  Her PD-L1 expression is 50 percent.   Therefore the patient is currently undergoing targeted treatment with Tagrisso 80 mg p.o. daily rather than immunotherapy.   Her first dose of treatment was on 05/22/21.  She is tolerating this well.    The patient recently had a restaging CT scan performed.  Dr. Julien Nordmann personally independently reviewed the scan discussed results with the patient today.  The scan showed ***.  Dr. Julien Nordmann recommends that she continue on the same treatment at the same dose.   The patient was seen with Dr. Julien Nordmann today.  Labs were reviewed.  Recommend that she continue on the same treatment at the same dose.  We will see her back for follow-up visit in 4 to 6 weeks?  For evaluation and repeat blood work   She will continue taking Remeron for her decreased appetite.  The patient mentions that she has an improved appetite and although she was unable to stand on the scale today, it appears that she has gained weight prior to her last appointment.   PT.   She will continue taking Eliquis for the thrombus that was noted on the brain MRI from 04/19/2021.  Once she has completed her next refill then she can discontinue taking this.  The patient was advised to call immediately if she has any concerning symptoms in the interval. The patient voices understanding of current disease status and treatment options and is in agreement with the current care plan. All questions were answered. The patient knows to call the clinic with any problems, questions or concerns. We can certainly see the patient much sooner if necessary    No orders of the defined types were placed in this encounter.    I spent {CHL ONC TIME VISIT - OXBDZ:[id number]} counseling the patient face to  face. The total time spent in the appointment was {CHL ONC TIME VISIT - MHDQQ:[id number]}.  Zlatan Hornback L Silvia Hightower, PA-C 08/06/21

## 2021-08-09 ENCOUNTER — Other Ambulatory Visit: Payer: Self-pay

## 2021-08-10 ENCOUNTER — Inpatient Hospital Stay: Payer: Medicare Other | Admitting: Physician Assistant

## 2021-08-10 ENCOUNTER — Inpatient Hospital Stay: Payer: Medicare Other | Attending: Physician Assistant

## 2021-08-13 ENCOUNTER — Telehealth: Payer: Self-pay | Admitting: Internal Medicine

## 2021-08-13 NOTE — Telephone Encounter (Signed)
.  Called patient to schedule appointment per 7/28 inbasket, patient is aware of date and time.

## 2021-08-14 ENCOUNTER — Other Ambulatory Visit: Payer: Self-pay

## 2021-08-16 NOTE — Progress Notes (Unsigned)
[facility] OFFICE PROGRESS NOTE  [name], MD [address] [place] [address]  DIAGNOSIS: Stage IV (T3, N2, M1 C) non-small cell lung cancer, adenocarcinoma presented with large right lower lobe lung mass in addition to subcarinal lymphadenopathy as well as bone metastasis diagnosed in April 2023   Molecular studies by foundation 1 showed EGFR T790M, Exon 19 deletion [phone]) AKT2- Aplification-equivocal ARID1A- [id number]*1 RICTOR- amplification-equivocal   PDL1 Expression 50%  PRIOR THERAPY: Palliative radiotherapy to the metastatic T3 bone lesion under the care of Dr. Mitzi Hansen  CURRENT THERAPY: Tagrisso 80 mg p.o. daily.  First dose on ~5/6  INTERVAL HISTORY: Anna Finley 76 y.o. female rto clinic today for follow-up visit accompanied by her daughter.  The patient is feeling fair today.  The patient was recently diagnosed with stage IV lung cancer.  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  She completed radiation to T3.  At her last appointment, we referred her to physical therapy.  This has *** .  Her weakness is ***and she is a little more ***active?  The patient is currently undergoing targeted treatment with Tagrisso due to her EGFR mutation.  Thus far she is tolerating this fairly well.  At her last appointment she thought that Tagrisso made her feel "funny".  When asked to elaborate she reports lightheadedness.  Of note the patient has poor nutrition.  We referred her to a dietitian.  I also prescribed her Remeron as well.  Her appetite has ***since she was last seen.  Weight?  Otherwise she denies any new concerning complaints today.  Denies any fever, chills, or night sweats.  Denies any chest pain or mops this.  Denies any nausea, vomiting, diarrhea, or constipation.  She reports dyspnea on exertion which  she thinks is improved compared to prior.  She has a mild cough but not enough to warrant taking any cough medication.  Discussed with her last appointment that she can take Delsym or Robitussin if needed.  She has mild headaches which improves with Tylenol.  The patient had a staging brain MRI which is negative for metastatic disease to the brain of note.  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 she is here today for evaluation and repeat blood work.   MEDICAL HISTORY: Past Medical History:  Diagnosis Date   COPD (chronic obstructive pulmonary disease) with chronic bronchitis (HCC)    HTN (hypertension)    Pneumothorax     ALLERGIES:  has No Known Allergies.  MEDICATIONS:  Current Outpatient Medications  Medication Sig Dispense Refill   acetaminophen (TYLENOL) 500 MG tablet Take 500 mg by mouth every 6 (six) hours as needed for moderate pain or headache.     apixaban (ELIQUIS) 5 MG TABS tablet Take 1 tablet (5 mg total) by mouth 2 (two) times daily. Follow up with Dr. Arbutus Ped for additional refills 60 tablet 1   atenolol (TENORMIN) 50 MG tablet Take 1 tablet (50 mg total) by mouth daily. 30 tablet 0   atenolol-chlorthalidone (TENORETIC) 50-25 MG tablet Take 1 tablet by mouth daily.     atorvastatin (LIPITOR) 40 MG tablet Take 40 mg by mouth daily.     dexamethasone (DECADRON) 4 MG tablet See Taper instruction sheet 30 tablet 1   gabapentin (NEURONTIN) 300 MG capsule Take 300 mg by mouth 2 (two) times daily.  metFORMIN (GLUCOPHAGE) 500 MG tablet Take 500 mg by mouth daily.     mirtazapine (REMERON) 15 MG tablet Take 1 tablet (15 mg total) by mouth at bedtime. 30 tablet 2   montelukast (SINGULAIR) 10 MG tablet Take 10 mg by mouth at bedtime.     osimertinib mesylate (TAGRISSO) 80 MG tablet Take 1 tablet (80 mg total) by mouth daily. 30 tablet 3   polyethylene glycol (MIRALAX / GLYCOLAX) 17 g packet Take 17 g by mouth daily as needed for mild constipation. Meds to  bed, thanks 14 each 0   potassium chloride SA (KLOR-CON M) 20 MEQ tablet Take 1 tablet (20 mEq total) by mouth daily. 5 tablet 0   promethazine-dextromethorphan (PROMETHAZINE-DM) 6.25-15 MG/5ML syrup Take 5 mLs by mouth 4 (four) times daily as needed for cough.     WIXELA INHUB 250-50 MCG/ACT AEPB 1 puff 2 (two) times daily.     No current facility-administered medications for this visit.    SURGICAL HISTORY:  Past Surgical History:  Procedure Laterality Date   BRONCHIAL NEEDLE ASPIRATION BIOPSY  04/20/2021   Procedure: BRONCHIAL NEEDLE ASPIRATION BIOPSIES;  Surgeon: Candee Furbish, MD;  Location: [facility];  Service: Endoscopy;;   BRONCHIAL WASHINGS  04/20/2021   Procedure: BRONCHIAL WASHINGS;  Surgeon: Candee Furbish, MD;  Location: [facility];  Service: Endoscopy;;   ENDOBRONCHIAL ULTRASOUND N/A 04/20/2021   Procedure: ENDOBRONCHIAL ULTRASOUND;  Surgeon: Candee Furbish, MD;  Location: [facility];  Service: Endoscopy;  Laterality: N/A;   HIP ARTHROPLASTY     VIDEO BRONCHOSCOPY Left 04/20/2021   Procedure: VIDEO BRONCHOSCOPY WITH FLUORO;  Surgeon: Candee Furbish, MD;  Location: [facility];  Service: Endoscopy;  Laterality: Left;  EBUS    REVIEW OF SYSTEMS:   Review of Systems  Constitutional: Negative for appetite change, chills, fatigue, fever and unexpected weight change.  HENT:   Negative for mouth sores, nosebleeds, sore throat and trouble swallowing.   Eyes: Negative for eye problems and icterus.  Respiratory: Negative for cough, hemoptysis, shortness of breath and wheezing.   Cardiovascular: Negative for chest pain and leg swelling.  Gastrointestinal: Negative for abdominal pain, constipation, diarrhea, nausea and vomiting.  Genitourinary: Negative for bladder incontinence, difficulty urinating, dysuria, frequency and hematuria.   Musculoskeletal: Negative for back pain, gait problem, neck pain and neck stiffness.  Skin: Negative for itching and rash.  Neurological:  Negative for dizziness, extremity weakness, gait problem, headaches, light-headedness and seizures.  Hematological: Negative for adenopathy. Does not bruise/bleed easily.  Psychiatric/Behavioral: Negative for confusion, depression and sleep disturbance. The patient is not nervous/anxious.     PHYSICAL EXAMINATION:  There were no vitals taken for this visit.  ECOG PERFORMANCE STATUS: {CHL ONC ECOG [id number]  Physical Exam  Constitutional: Oriented to person, place, and time and well-developed, well-nourished, and in no distress. No distress.  HENT:  Head: Normocephalic and atraumatic.  Mouth/Throat: Oropharynx is clear and moist. No oropharyngeal exudate.  Eyes: Conjunctivae are normal. Right eye exhibits no discharge. Left eye exhibits no discharge. No scleral icterus.  Neck: Normal range of motion. Neck supple.  Cardiovascular: Normal rate, regular rhythm, normal heart sounds and intact distal pulses.   Pulmonary/Chest: Effort normal and breath sounds normal. No respiratory distress. No wheezes. No rales.  Abdominal: Soft. Bowel sounds are normal. Exhibits no distension and no mass. There is no tenderness.  Musculoskeletal: Normal range of motion. Exhibits no edema.  Lymphadenopathy:    No cervical adenopathy.  Neurological: Alert and oriented to person,  place, and time. Exhibits normal muscle tone. Gait normal. Coordination normal.  Skin: Skin is warm and dry. No rash noted. Not diaphoretic. No erythema. No pallor.  Psychiatric: Mood, memory and judgment normal.  Vitals reviewed.  LABORATORY DATA: Lab Results  Component Value Date   WBC 6.6 07/01/2021   HGB 10.7 (L) 07/01/2021   HCT 33.0 (L) 07/01/2021   MCV 87.8 07/01/2021   PLT 438 (H) 07/01/2021      Chemistry      Component Value Date/Time   NA 137 07/01/2021 1549   K 3.8 07/01/2021 1549   CL 100 07/01/2021 1549   CO2 30 07/01/2021 1549   BUN 8 07/01/2021 1549   CREATININE 0.60 07/01/2021 1549      Component  Value Date/Time   CALCIUM 9.0 07/01/2021 1549   ALKPHOS 98 07/01/2021 1549   AST 15 07/01/2021 1549   ALT 12 07/01/2021 1549   BILITOT 0.3 07/01/2021 1549       RADIOGRAPHIC STUDIES:  CT Chest W Contrast  Result Date: 08/04/2021 CLINICAL DATA:  Primary Cancer Type: Lung Imaging Indication: Assess response to therapy Interval therapy since last imaging? Yes Initial Cancer Diagnosis Date: 04/20/2021; Established by: Biopsy-proven Detailed Pathology: Stage IV non-small cell lung cancer, adenocarcinoma. Primary Tumor location: Right lower lobe. Metastatic bone lesions in the spine and pelvis and the right adrenal gland. Surgeries: Left hip arthroplasty. Chemotherapy: Yes; Ongoing? Yes; Tagrisso daily Immunotherapy? No Radiation therapy? Yes; Date Range: 04/21/2021 - 05/05/2021; Target: T3 bone lesion and right lung * Tracking Code: BO * EXAM: CT CHEST, ABDOMEN, AND PELVIS WITH CONTRAST TECHNIQUE: Multidetector CT imaging of the chest, abdomen and pelvis was performed following the standard protocol during bolus administration of intravenous contrast. RADIATION DOSE REDUCTION: This exam was performed according to the departmental dose-optimization program which includes automated exposure control, adjustment of the mA and/or kV according to patient size and/or use of iterative reconstruction technique. CONTRAST:  51mL OMNIPAQUE IOHEXOL 300 MG/ML  SOLN COMPARISON:  04/29/2021 PET-CT. Most recent CT chest 03/26/2021. Abdominopelvic CT 05/06/2021. FINDINGS: Cardiovascular: No acute vascular findings are evident. There is atherosclerosis of the aorta, great vessels and coronary arteries. The heart size is normal. There is no pericardial effusion. Mediastinum/Nodes: Hypermetabolic subcarinal node on PET-CT currently measures 9 mm on image 25/2 (previously 12 mm). There are no enlarged mediastinal, hilar or axillary lymph nodes. A small hiatal hernia with a paraesophageal component and distal esophageal wall  thickening are unchanged. No abnormality of the trachea or thyroid gland is seen. Lungs/Pleura: Trace pleural fluid on the right. No pneumothorax. Moderate centrilobular and paraseptal emphysema. Significant improvement in the previously demonstrated lobulated mass in the right lower lobe, currently measuring 2.6 x 2.3 cm on image 73/6 (previously 6.3 x 3.9 cm). The previously demonstrated soft tissue component contiguous with the hilum is no longer seen. There is persistent occlusion of a peripheral right lower lobe bronchus. There are increased patchy airspace opacities throughout the right lower and middle lobes as well as the medial aspect of the right upper lobe, attributed to interval radiation therapy. The left lower lobe collapse seen on PET-CT has resolved, and the left lung is clear. No new or enlarging nodules are identified. Musculoskeletal/Chest wall: Previously demonstrated T3 fracture demonstrates progressive collapse with more than 50% loss of vertebral body height and mild osseous retropulsion. There is increased sclerosis throughout the T3 vertebral body. There are new sclerotic lesions throughout the spine, most notable at T5 and T12, most consistent with treated  osseous metastases. There is a healing fracture of the left 2nd rib laterally which demonstrates no definite pathologic features. No epidural tumor or significant spinal canal compromise identified. Bilateral glenohumeral arthropathy. CT ABDOMEN AND PELVIS FINDINGS Hepatobiliary: The liver is normal in density without suspicious focal abnormality. Small low-density hepatic lesions are unchanged, likely cysts. No evidence of gallstones, gallbladder wall thickening or biliary dilatation. Pancreas: Unremarkable. No pancreatic ductal dilatation or surrounding inflammatory changes. Spleen: Normal in size without focal abnormality. Adrenals/Urinary Tract: Previously demonstrated right adrenal nodule which was hypermetabolic on PET-CT has  decreased in size and become less well-defined, measuring approximately 1.7 x 0.7 cm on image 50/2 (previously 2.3 x 1.2 cm). The left adrenal gland appears normal. Both kidneys demonstrate cortical scarring. No evidence of renal mass or hydronephrosis. The bladder appears normal for its degree of distention. Stomach/Bowel: Enteric contrast was administered and has passed into the mid colon. As above, stable small hiatal hernia which has a paraesophageal component. There are diverticular changes throughout the colon without suspicious bowel wall thickening, distention or surrounding inflammation. The appendix appears normal. Vascular/Lymphatic: There are no enlarged abdominal or pelvic lymph nodes. Aortic and branch vessel atherosclerosis without aneurysm or large vessel occlusion. Reproductive: Multiple calcified uterine fibroids are noted. No suspicious adnexal findings. Other: No ascites or peritoneal nodularity. Musculoskeletal: Interval sclerosis of previously demonstrated metastasis within the L4 vertebral body. Interval sclerosis of several previously demonstrated metastases within the bony pelvis. No pathologic fracture. 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 Previous left total hip arthroplasty. IMPRESSION: 1. Interval response to treatment. The dominant right lower lobe mass has decreased in size, and there is improvement in the previously demonstrated right hilar and mediastinal adenopathy and right adrenal metastasis. No disease progression identified. 2. Previously demonstrated multifocal osseous metastatic disease shows progressive sclerosis consistent with response to therapy. Pathologic fracture at T3 has progressed with mild osseous retropulsion, but no gross spinal canal compromise. 3. Probable radiation changes in the right lung. Left lower lobe collapse demonstrated on PET-CT has resolved. 4. Stable incidental  findings including a hiatal hernia with a paraseptal component. Coronary and aortic atherosclerosis (ICD10-I70.0). Emphysema (ICD10-J43.9). Electronically Signed   By: Richardean Sale M.D.   On: 08/04/2021 12:13   CT Abdomen Pelvis W Contrast  Result Date: 08/04/2021 CLINICAL DATA:  Primary Cancer Type: Lung Imaging Indication: Assess response to therapy Interval therapy since last imaging? Yes Initial Cancer Diagnosis Date: 04/20/2021; Established by: Biopsy-proven Detailed Pathology: Stage IV non-small cell lung cancer, adenocarcinoma. Primary Tumor location: Right lower lobe. Metastatic bone lesions in the spine and pelvis and the right adrenal gland. Surgeries: Left hip arthroplasty. Chemotherapy: Yes; Ongoing? Yes; Tagrisso daily Immunotherapy? No Radiation therapy? Yes; Date Range: 04/21/2021 - 05/05/2021; Target: T3 bone lesion and right lung * Tracking Code: BO * EXAM: CT CHEST, ABDOMEN, AND PELVIS WITH CONTRAST TECHNIQUE: Multidetector CT imaging of the chest, abdomen and pelvis was performed following the standard protocol during bolus administration of intravenous contrast. RADIATION DOSE REDUCTION: This exam was performed according to the departmental dose-optimization program which includes automated exposure control, adjustment of the mA and/or kV according to patient size and/or use of iterative reconstruction technique. CONTRAST:  25mL OMNIPAQUE IOHEXOL 300 MG/ML  SOLN COMPARISON:  04/29/2021 PET-CT. Most recent CT chest 03/26/2021. Abdominopelvic CT 05/06/2021. FINDINGS: Cardiovascular: No acute vascular findings are evident. There is atherosclerosis of the aorta, great vessels and coronary arteries. The heart size is normal. There  is no pericardial effusion. Mediastinum/Nodes: Hypermetabolic subcarinal node on PET-CT currently measures 9 mm on image 25/2 (previously 12 mm). There are no enlarged mediastinal, hilar or axillary lymph nodes. A small hiatal hernia with a paraesophageal component  and distal esophageal wall thickening are unchanged. No abnormality of the trachea or thyroid gland is seen. Lungs/Pleura: Trace pleural fluid on the right. No pneumothorax. Moderate centrilobular and paraseptal emphysema. Significant improvement in the previously demonstrated lobulated mass in the right lower lobe, currently measuring 2.6 x 2.3 cm on image 73/6 (previously 6.3 x 3.9 cm). The previously demonstrated soft tissue component contiguous with the hilum is no longer seen. There is persistent occlusion of a peripheral right lower lobe bronchus. There are increased patchy airspace opacities throughout the right lower and middle lobes as well as the medial aspect of the right upper lobe, attributed to interval radiation therapy. The left lower lobe collapse seen on PET-CT has resolved, and the left lung is clear. No new or enlarging nodules are identified. Musculoskeletal/Chest wall: Previously demonstrated T3 fracture demonstrates progressive collapse with more than 50% loss of vertebral body height and mild osseous retropulsion. There is increased sclerosis throughout the T3 vertebral body. There are new sclerotic lesions throughout the spine, most notable at T5 and T12, most consistent with treated osseous metastases. There is a healing fracture of the left 2nd rib laterally which demonstrates no definite pathologic features. No epidural tumor or significant spinal canal compromise identified. Bilateral glenohumeral arthropathy. CT ABDOMEN AND PELVIS FINDINGS Hepatobiliary: The liver is normal in density without suspicious focal abnormality. Small low-density hepatic lesions are unchanged, likely cysts. No evidence of gallstones, gallbladder wall thickening or biliary dilatation. Pancreas: Unremarkable. No pancreatic ductal dilatation or surrounding inflammatory changes. Spleen: Normal in size without focal abnormality. Adrenals/Urinary Tract: Previously demonstrated right adrenal nodule which was  hypermetabolic on PET-CT has decreased in size and become less well-defined, measuring approximately 1.7 x 0.7 cm on image 50/2 (previously 2.3 x 1.2 cm). The left adrenal gland appears normal. Both kidneys demonstrate cortical scarring. No evidence of renal mass or hydronephrosis. The bladder appears normal for its degree of distention. Stomach/Bowel: Enteric contrast was administered and has passed into the mid colon. As above, stable small hiatal hernia which has a paraesophageal component. There are diverticular changes throughout the colon without suspicious bowel wall thickening, distention or surrounding inflammation. The appendix appears normal. Vascular/Lymphatic: There are no enlarged abdominal or pelvic lymph nodes. Aortic and branch vessel atherosclerosis without aneurysm or large vessel occlusion. Reproductive: Multiple calcified uterine fibroids are noted. No suspicious adnexal findings. Other: No ascites or peritoneal nodularity. Musculoskeletal: Interval sclerosis of previously demonstrated metastasis within the L4 vertebral body. Interval sclerosis of several previously demonstrated metastases within the bony pelvis. No pathologic fracture. 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 Previous left total hip arthroplasty. IMPRESSION: 1. Interval response to treatment. The dominant right lower lobe mass has decreased in size, and there is improvement in the previously demonstrated right hilar and mediastinal adenopathy and right adrenal metastasis. No disease progression identified. 2. Previously demonstrated multifocal osseous metastatic disease shows progressive sclerosis consistent with response to therapy. Pathologic fracture at T3 has progressed with mild osseous retropulsion, but no gross spinal canal compromise. 3. Probable radiation changes in the right lung. Left lower lobe collapse demonstrated on PET-CT has  resolved. 4. Stable incidental findings including a hiatal hernia with a paraseptal component. Coronary and aortic atherosclerosis (ICD10-I70.0). Emphysema (  ICD10-J43.9). Electronically Signed   By: Richardean Sale M.D.   On: 08/04/2021 12:13     ASSESSMENT/PLAN:  This is a very pleasant 76 year old African-American female diagnosed with stage IV (T3, N2, M1 C) non-small cell lung cancer, adenocarcinoma.  She presented with a left lower lobe lung mass in addition to subcarinal adenopathy.  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  She was diagnosed in April 2023.  Her molecular studies show that she is positive for an EGFR mutation with deletion in exon 19 in addition to T790 and.  Her PD-L1 expression is 50 percent.  Therefore the patient is currently undergoing targeted treatment with Tagrisso 80 mg p.o. daily rather than immunotherapy.  Her first dose of treatment was on 05/22/21.  She is tolerating this well.  Dr. Julien Nordmann discussed with the patient that her lightheadedness is likely not secondary to her Newman Nip and is likely due to her poor nutrition.  She was prescribed Remeron.  She was also referred to the dietitian who she is scheduled to see on ***  The patient was seen with Dr. Julien Nordmann today.  Labs were reviewed.  Recommend that she ***on the same treatment at the same dose.  I will arrange for restaging CT scan of the chest, abdomen, and pelvis in 4 weeks.  Physical therapy?  Eliquis?  For the thrombus that was noted on the brain MRI from 04/19/2021.   No orders of the defined types were placed in this encounter.    I spent {CHL ONC TIME VISIT - [id number]} counseling the patient face to face. The total time spent in the appointment was {CHL ONC TIME VISIT - BJYNW:[id number]}.  Anna Musleh L Eevee Borbon, PA-C 08/16/21

## 2021-08-19 ENCOUNTER — Inpatient Hospital Stay: Payer: Medicare Other | Attending: Physician Assistant

## 2021-08-19 ENCOUNTER — Other Ambulatory Visit: Payer: Self-pay

## 2021-08-19 ENCOUNTER — Inpatient Hospital Stay (HOSPITAL_BASED_OUTPATIENT_CLINIC_OR_DEPARTMENT_OTHER): Payer: Medicare Other | Admitting: Physician Assistant

## 2021-08-19 VITALS — BP 111/58 | HR 80 | Temp 97.2°F | Resp 18

## 2021-08-19 DIAGNOSIS — C3431 Malignant neoplasm of lower lobe, right bronchus or lung: Secondary | ICD-10-CM | POA: Diagnosis not present

## 2021-08-19 DIAGNOSIS — R112 Nausea with vomiting, unspecified: Secondary | ICD-10-CM | POA: Insufficient documentation

## 2021-08-19 DIAGNOSIS — Z79899 Other long term (current) drug therapy: Secondary | ICD-10-CM | POA: Insufficient documentation

## 2021-08-19 DIAGNOSIS — R110 Nausea: Secondary | ICD-10-CM

## 2021-08-19 DIAGNOSIS — C7971 Secondary malignant neoplasm of right adrenal gland: Secondary | ICD-10-CM | POA: Diagnosis not present

## 2021-08-19 DIAGNOSIS — R42 Dizziness and giddiness: Secondary | ICD-10-CM

## 2021-08-19 DIAGNOSIS — C7951 Secondary malignant neoplasm of bone: Secondary | ICD-10-CM | POA: Diagnosis not present

## 2021-08-19 LAB — CBC WITH DIFFERENTIAL (CANCER CENTER ONLY)
Abs Immature Granulocytes: 0.04 10*3/uL (ref 0.00–0.07)
Basophils Absolute: 0 10*3/uL (ref 0.0–0.1)
Basophils Relative: 0 %
Eosinophils Absolute: 0.1 10*3/uL (ref 0.0–0.5)
Eosinophils Relative: 1 %
HCT: 36.8 % (ref 36.0–46.0)
Hemoglobin: 11.7 g/dL — ABNORMAL LOW (ref 12.0–15.0)
Immature Granulocytes: 1 %
Lymphocytes Relative: 13 %
Lymphs Abs: 1.1 10*3/uL (ref 0.7–4.0)
MCH: 26.7 pg (ref 26.0–34.0)
MCHC: 31.8 g/dL (ref 30.0–36.0)
MCV: 83.8 fL (ref 80.0–100.0)
Monocytes Absolute: 0.5 10*3/uL (ref 0.1–1.0)
Monocytes Relative: 6 %
Neutro Abs: 6.7 10*3/uL (ref 1.7–7.7)
Neutrophils Relative %: 79 %
Platelet Count: 486 10*3/uL — ABNORMAL HIGH (ref 150–400)
RBC: 4.39 MIL/uL (ref 3.87–5.11)
RDW: 15.3 % (ref 11.5–15.5)
WBC Count: 8.4 10*3/uL (ref 4.0–10.5)
nRBC: 0 % (ref 0.0–0.2)

## 2021-08-19 LAB — CMP (CANCER CENTER ONLY)
ALT: 11 U/L (ref 0–44)
AST: 14 U/L — ABNORMAL LOW (ref 15–41)
Albumin: 3.8 g/dL (ref 3.5–5.0)
Alkaline Phosphatase: 83 U/L (ref 38–126)
Anion gap: 11 (ref 5–15)
BUN: 22 mg/dL (ref 8–23)
CO2: 29 mmol/L (ref 22–32)
Calcium: 9.7 mg/dL (ref 8.9–10.3)
Chloride: 95 mmol/L — ABNORMAL LOW (ref 98–111)
Creatinine: 0.67 mg/dL (ref 0.44–1.00)
GFR, Estimated: >60 mL/min (ref >60)
Glucose, Bld: 137 mg/dL — ABNORMAL HIGH (ref 70–99)
Potassium: 4 mmol/L (ref 3.5–5.1)
Sodium: 135 mmol/L (ref 135–145)
Total Bilirubin: 0.3 mg/dL (ref 0.3–1.2)
Total Protein: 8.2 g/dL — ABNORMAL HIGH (ref 6.5–8.1)

## 2021-08-20 ENCOUNTER — Telehealth: Payer: Self-pay | Admitting: Physician Assistant

## 2021-08-20 LAB — TSH: TSH: 1.11 u[IU]/mL (ref 0.350–4.500)

## 2021-08-20 NOTE — Telephone Encounter (Signed)
Scheduled per 08/03 los, patient has been called. Voicemail is not set up, calender will be mailed.

## 2021-08-21 ENCOUNTER — Other Ambulatory Visit: Payer: Self-pay

## 2021-08-24 ENCOUNTER — Other Ambulatory Visit (HOSPITAL_COMMUNITY): Payer: Self-pay

## 2021-08-24 ENCOUNTER — Other Ambulatory Visit: Payer: Self-pay | Admitting: Internal Medicine

## 2021-08-24 MED ORDER — OSIMERTINIB MESYLATE 80 MG PO TABS
80.0000 mg | ORAL_TABLET | Freq: Every day | ORAL | 3 refills | Status: DC
  Filled 2021-08-24: qty 30, 30d supply, fill #0
  Filled 2021-09-21: qty 30, 30d supply, fill #1
  Filled 2021-10-21: qty 30, 30d supply, fill #2
  Filled 2021-11-25: qty 30, 30d supply, fill #3

## 2021-08-25 ENCOUNTER — Other Ambulatory Visit (HOSPITAL_COMMUNITY): Payer: Self-pay

## 2021-09-02 ENCOUNTER — Other Ambulatory Visit (HOSPITAL_COMMUNITY): Payer: Self-pay

## 2021-09-08 ENCOUNTER — Other Ambulatory Visit (HOSPITAL_COMMUNITY): Payer: Self-pay

## 2021-09-17 ENCOUNTER — Other Ambulatory Visit: Payer: Self-pay

## 2021-09-21 ENCOUNTER — Other Ambulatory Visit (HOSPITAL_COMMUNITY): Payer: Self-pay

## 2021-09-28 ENCOUNTER — Other Ambulatory Visit (HOSPITAL_COMMUNITY): Payer: Self-pay

## 2021-09-29 NOTE — Progress Notes (Deleted)
[place] OFFICE PROGRESS NOTE  [name], MD [address] [place] [address]  DIAGNOSIS: Stage IV (T3, N2, M1 C) non-small cell lung cancer, adenocarcinoma presented with large right lower lobe lung mass in addition to subcarinal lymphadenopathy as well as bone metastasis diagnosed in April 2023   Molecular studies by foundation 1 showed EGFR T790M, Exon 19 deletion [id number]) AKT2- Aplification-equivocal ARID1A- R1741fs*1 RICTOR- amplification-equivocal   PDL1 Expression 50%  PRIOR THERAPY: Palliative radiotherapy to the metastatic T3 bone lesion under the care of Dr. Lisbeth Renshaw  CURRENT THERAPY:  Tagrisso 80 mg p.o. daily.  First dose on ~5/6  INTERVAL HISTORY: Zaira Iacovelli 76 y.o. female returns to clinic today for follow-up visit accompanied by ***.  The patient is being treated for stage IV lung cancer.  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  She completed radiation to T3 previously.  She was referred to physical therapy several months ago as well as occupational therapy by her PCP.  She is being fitted for an electric wheelchair by them as well.  The patient is still not ambulatory at home and her family picks her up to get her around the house.  Regarding her targeted treatment with Tagrisso for her EGFR mutation, she is tolerating this fairly well.  She denies any fever, chills, or night sweats.  She is struggled with poor nutrition and is very cachectic appearing although ***.  She was previously referred to clear if she had this appointment.  She was also prescribed Remeron.  She reports that her appetite is good she is eating well although she continues to frail and thin by did not scan on this scale today to see if she gained any weight.  Otherwise she denies any fever, chills or night sweats.  Denies any  chest pain or hemoptysis.  She reports her "normal" cough.  She reports stable dyspnea on exertion.  Denies any nausea, vomiting, diarrhea, or constipation.  Denies any headache or visual changes.  She denies rashes or skin changes.  She is here today for evaluation and repeat blood work.    MEDICAL HISTORY: Past Medical History:  Diagnosis Date   COPD (chronic obstructive pulmonary disease) with chronic bronchitis (HCC)    HTN (hypertension)    Pneumothorax     ALLERGIES:  has No Known Allergies.  MEDICATIONS:  Current Outpatient Medications  Medication Sig Dispense Refill   acetaminophen (TYLENOL) 500 MG tablet Take 500 mg by mouth every 6 (six) hours as needed for moderate pain or headache.     apixaban (ELIQUIS) 5 MG TABS tablet Take 1 tablet (5 mg total) by mouth 2 (two) times daily. Follow up with Dr. Julien Nordmann for additional refills 60 tablet 1   atenolol (TENORMIN) 50 MG tablet Take 1 tablet (50 mg total) by mouth daily. 30 tablet 0   atenolol-chlorthalidone (TENORETIC) 50-25 MG tablet Take 1 tablet by mouth daily.     atorvastatin (LIPITOR) 40 MG tablet Take 40 mg by mouth daily.     dexamethasone (DECADRON) 4 MG tablet See Taper instruction sheet 30 tablet 1   gabapentin (NEURONTIN) 300 MG capsule Take 300 mg by mouth 2 (two) times daily.     metFORMIN (GLUCOPHAGE) 500 MG tablet Take 500 mg by mouth daily.     mirtazapine (REMERON) 15 MG tablet Take 1 tablet (15 mg total) by  mouth at bedtime. 30 tablet 2   montelukast (SINGULAIR) 10 MG tablet Take 10 mg by mouth at bedtime.     osimertinib mesylate (TAGRISSO) 80 MG tablet Take 1 tablet (80 mg total) by mouth daily. 30 tablet 3   polyethylene glycol (MIRALAX / GLYCOLAX) 17 g packet Take 17 g by mouth daily as needed for mild constipation. Meds to bed, thanks 14 each 0   potassium chloride SA (KLOR-CON M) 20 MEQ tablet Take 1 tablet (20 mEq total) by mouth daily. 5 tablet 0   promethazine-dextromethorphan (PROMETHAZINE-DM) 6.25-15  MG/5ML syrup Take 5 mLs by mouth 4 (four) times daily as needed for cough.     WIXELA INHUB 250-50 MCG/ACT AEPB 1 puff 2 (two) times daily.     No current facility-administered medications for this visit.    SURGICAL HISTORY:  Past Surgical History:  Procedure Laterality Date   BRONCHIAL NEEDLE ASPIRATION BIOPSY  04/20/2021   Procedure: BRONCHIAL NEEDLE ASPIRATION BIOPSIES;  Surgeon: Candee Furbish, MD;  Location: [facility];  Service: Endoscopy;;   BRONCHIAL WASHINGS  04/20/2021   Procedure: BRONCHIAL WASHINGS;  Surgeon: Candee Furbish, MD;  Location: [facility];  Service: Endoscopy;;   ENDOBRONCHIAL ULTRASOUND N/A 04/20/2021   Procedure: ENDOBRONCHIAL ULTRASOUND;  Surgeon: Candee Furbish, MD;  Location: [facility];  Service: Endoscopy;  Laterality: N/A;   HIP ARTHROPLASTY     VIDEO BRONCHOSCOPY Left 04/20/2021   Procedure: VIDEO BRONCHOSCOPY WITH FLUORO;  Surgeon: Candee Furbish, MD;  Location: [facility];  Service: Endoscopy;  Laterality: Left;  EBUS    REVIEW OF SYSTEMS:   Review of Systems  Constitutional: Negative for appetite change, chills, fatigue, fever and unexpected weight change.  HENT:   Negative for mouth sores, nosebleeds, sore throat and trouble swallowing.   Eyes: Negative for eye problems and icterus.  Respiratory: Negative for cough, hemoptysis, shortness of breath and wheezing.   Cardiovascular: Negative for chest pain and leg swelling.  Gastrointestinal: Negative for abdominal pain, constipation, diarrhea, nausea and vomiting.  Genitourinary: Negative for bladder incontinence, difficulty urinating, dysuria, frequency and hematuria.   Musculoskeletal: Negative for back pain, gait problem, neck pain and neck stiffness.  Skin: Negative for itching and rash.  Neurological: Negative for dizziness, extremity weakness, gait problem, headaches, light-headedness and seizures.  Hematological: Negative for adenopathy. Does not bruise/bleed easily.   Psychiatric/Behavioral: Negative for confusion, depression and sleep disturbance. The patient is not nervous/anxious.     PHYSICAL EXAMINATION:  There were no vitals taken for this visit.  ECOG PERFORMANCE STATUS: {CHL ONC ECOG [id number]  Physical Exam  Constitutional: Oriented to person, place, and time and well-developed, well-nourished, and in no distress. No distress.  HENT:  Head: Normocephalic and atraumatic.  Mouth/Throat: Oropharynx is clear and moist. No oropharyngeal exudate.  Eyes: Conjunctivae are normal. Right eye exhibits no discharge. Left eye exhibits no discharge. No scleral icterus.  Neck: Normal range of motion. Neck supple.  Cardiovascular: Normal rate, regular rhythm, normal heart sounds and intact distal pulses.   Pulmonary/Chest: Effort normal and breath sounds normal. No respiratory distress. No wheezes. No rales.  Abdominal: Soft. Bowel sounds are normal. Exhibits no distension and no mass. There is no tenderness.  Musculoskeletal: Normal range of motion. Exhibits no edema.  Lymphadenopathy:    No cervical adenopathy.  Neurological: Alert and oriented to person, place, and time. Exhibits normal muscle tone. Gait normal. Coordination normal.  Skin: Skin is warm and dry. No rash noted. Not diaphoretic. No erythema. No pallor.  Psychiatric: Mood, memory and judgment normal.  Vitals reviewed.  LABORATORY DATA: Lab Results  Component Value Date   WBC 8.4 08/19/2021   HGB 11.7 (L) 08/19/2021   HCT 36.8 08/19/2021   MCV 83.8 08/19/2021   PLT 486 (H) 08/19/2021      Chemistry      Component Value Date/Time   NA 135 08/19/2021 1452   K 4.0 08/19/2021 1452   CL 95 (L) 08/19/2021 1452   CO2 29 08/19/2021 1452   BUN 22 08/19/2021 1452   CREATININE 0.67 08/19/2021 1452      Component Value Date/Time   CALCIUM 9.7 08/19/2021 1452   ALKPHOS 83 08/19/2021 1452   AST 14 (L) 08/19/2021 1452   ALT 11 08/19/2021 1452   BILITOT 0.3 08/19/2021 1452        RADIOGRAPHIC STUDIES:  No results found.   ASSESSMENT/PLAN:  M1 C) non-small cell lung cancer, adenocarcinoma.  She presented with a left lower lobe lung mass in addition to subcarinal adenopathy.  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  She was diagnosed in April 2023.  Her molecular studies show that she is positive for an EGFR mutation with deletion in exon 19 in addition to T790 and.  Her PD-L1 expression is 50 percent.   Therefore the patient is currently undergoing targeted treatment with Tagrisso 80 mg p.o. daily rather than immunotherapy.   Her first dose of treatment was on 05/22/21.  She is tolerating this well.  She was prescribed Remeron.  She was also referred to the dietitian.  She missed her appointment***.   I will arrange for restaging CT scan of the chest abdomen, pelvis prior to her next appointment in 6 weeks..  We will see her back for follow-up visit in 6 weeks for evaluation and repeat blood work.   The patient is not ambulatory since her diagnosis.  Per chart review, it appears she has been seen by a physical therapist in July 2023.  I previously gave the family the number to get back in touch with physical therapy.    The patient completed the 88-month course of Eliquis that was prescribed for the incidental thrombus noted on her brain MRI from 04/19/2021.   The patient has antiemetics at home if needed   No orders of the defined types were placed in this encounter.    I spent {CHL ONC TIME VISIT - BSJGG:[id number]} counseling the patient face to face. The total time spent in the appointment was {CHL ONC TIME VISIT - YYTKP:[id number]}.  Harding Thomure L Amelia Macken, PA-C 09/29/21

## 2021-09-30 ENCOUNTER — Inpatient Hospital Stay: Payer: Medicare Other | Admitting: Physician Assistant

## 2021-09-30 ENCOUNTER — Inpatient Hospital Stay: Payer: Medicare Other | Attending: Physician Assistant

## 2021-09-30 ENCOUNTER — Telehealth: Payer: Self-pay

## 2021-09-30 NOTE — Telephone Encounter (Signed)
Pt missed her appt again today. I have spoken with her daughter, Peter Congo, who advised she didn't remember the appt but would like to reschedule it. I advised I will message the scheduling department to get her rescheduled.

## 2021-10-01 ENCOUNTER — Telehealth: Payer: Self-pay | Admitting: Physician Assistant

## 2021-10-01 NOTE — Progress Notes (Deleted)
[place] OFFICE PROGRESS NOTE  [name], MD [address] [place] [address]  DIAGNOSIS: [place] OFFICE PROGRESS NOTE  [name], MD [address] [place] [address]  DIAGNOSIS: Stage IV (T3, N2, M1 C) non-small cell lung cancer, adenocarcinoma presented with large right lower lobe lung mass in addition to subcarinal lymphadenopathy as well as bone metastasis diagnosed in April 2023   Molecular studies by [facility] 1 showed EGFR T790M, Exon 19 deletion ([id number]) AKT2- Aplification-equivocal ARID1A- R1729f*1 RICTOR- amplification-equivocal   PDL1 Expression 50%  PRIOR THERAPY: Palliative radiotherapy to the metastatic T3 bone lesion under the care of Dr. MLisbeth Renshaw CURRENT THERAPY:  Tagrisso 80 mg p.o. daily.  First dose on ~5/6  INTERVAL HISTORY: Anna Mehlhoff89y.o. female returns to clinic today for follow-up visit accompanied by ***.  The patient is being treated for stage IV lung cancer.  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  She completed radiation to T3 previously.  She was referred to physical therapy several months ago as well as occupational therapy by her PCP.  She is being fitted for an electric wheelchair by them as well.  The patient is still not ambulatory at home and her family picks her up to get her around the house.  Regarding her targeted treatment with Tagrisso for her EGFR mutation, she is tolerating this fairly well.  She denies any fever, chills, or night sweats.  She is struggled with poor nutrition and is very cachectic appearing although ***.  She was previously referred to clear if she had this appointment.  She was also prescribed Remeron.  She reports that her appetite is good she is eating well although she continues to frail and thin  by did not scan on this scale today to see if she gained any weight.  Otherwise she denies any fever, chills or night sweats.  Denies any chest pain or hemoptysis.  She reports her "normal" cough.  She reports stable dyspnea on exertion.  Denies any nausea, vomiting, diarrhea, or constipation.  Denies any headache or visual changes.  She denies rashes or skin changes.  She is here today for evaluation and repeat blood work.   MEDICAL HISTORY: Past Medical History:  Diagnosis Date   COPD (chronic obstructive pulmonary disease) with chronic bronchitis (HCC)    HTN (hypertension)    Pneumothorax     ALLERGIES:  has No Known Allergies.  MEDICATIONS:  Current Outpatient Medications  Medication Sig Dispense Refill   acetaminophen (TYLENOL) 500 MG tablet Take 500 mg by mouth every 6 (six) hours as needed for moderate pain or headache.     apixaban (ELIQUIS) 5 MG TABS tablet Take 1 tablet (5 mg total) by mouth 2 (two) times daily. Follow up with Dr. MJulien Nordmannfor additional refills 60 tablet 1   atenolol (TENORMIN) 50 MG tablet Take 1 tablet (50 mg total) by mouth daily. 30 tablet 0   atenolol-chlorthalidone (TENORETIC) 50-25 MG tablet Take 1 tablet by mouth daily.     atorvastatin (LIPITOR) 40 MG tablet Take 40 mg by mouth daily.     dexamethasone (DECADRON) 4 MG tablet See Taper instruction sheet 30 tablet 1   gabapentin (NEURONTIN) 300 MG capsule Take 300 mg by mouth 2 (two) times daily.     metFORMIN (GLUCOPHAGE) 500 MG tablet Take  500 mg by mouth daily.     mirtazapine (REMERON) 15 MG tablet Take 1 tablet (15 mg total) by mouth at bedtime. 30 tablet 2   montelukast (SINGULAIR) 10 MG tablet Take 10 mg by mouth at bedtime.     osimertinib mesylate (TAGRISSO) 80 MG tablet Take 1 tablet (80 mg total) by mouth daily. 30 tablet 3   polyethylene glycol (MIRALAX / GLYCOLAX) 17 g packet Take 17 g by mouth daily as needed for mild constipation. Meds to bed, thanks 14 each 0   potassium chloride SA  (KLOR-CON M) 20 MEQ tablet Take 1 tablet (20 mEq total) by mouth daily. 5 tablet 0   promethazine-dextromethorphan (PROMETHAZINE-DM) 6.25-15 MG/5ML syrup Take 5 mLs by mouth 4 (four) times daily as needed for cough.     WIXELA INHUB 250-50 MCG/ACT AEPB 1 puff 2 (two) times daily.     No current facility-administered medications for this visit.    SURGICAL HISTORY:  Past Surgical History:  Procedure Laterality Date   BRONCHIAL NEEDLE ASPIRATION BIOPSY  04/20/2021   Procedure: BRONCHIAL NEEDLE ASPIRATION BIOPSIES;  Surgeon: Candee Furbish, MD;  Location: [facility];  Service: Endoscopy;;   BRONCHIAL WASHINGS  04/20/2021   Procedure: BRONCHIAL WASHINGS;  Surgeon: Candee Furbish, MD;  Location: [facility];  Service: Endoscopy;;   ENDOBRONCHIAL ULTRASOUND N/A 04/20/2021   Procedure: ENDOBRONCHIAL ULTRASOUND;  Surgeon: Candee Furbish, MD;  Location: [facility];  Service: Endoscopy;  Laterality: N/A;   HIP ARTHROPLASTY     VIDEO BRONCHOSCOPY Left 04/20/2021   Procedure: VIDEO BRONCHOSCOPY WITH FLUORO;  Surgeon: Candee Furbish, MD;  Location: [facility];  Service: Endoscopy;  Laterality: Left;  EBUS    REVIEW OF SYSTEMS:   Review of Systems  Constitutional: Negative for appetite change, chills, fatigue, fever and unexpected weight change.  HENT:   Negative for mouth sores, nosebleeds, sore throat and trouble swallowing.   Eyes: Negative for eye problems and icterus.  Respiratory: Negative for cough, hemoptysis, shortness of breath and wheezing.   Cardiovascular: Negative for chest pain and leg swelling.  Gastrointestinal: Negative for abdominal pain, constipation, diarrhea, nausea and vomiting.  Genitourinary: Negative for bladder incontinence, difficulty urinating, dysuria, frequency and hematuria.   Musculoskeletal: Negative for back pain, gait problem, neck pain and neck stiffness.  Skin: Negative for itching and rash.  Neurological: Negative for dizziness, extremity weakness, gait  problem, headaches, light-headedness and seizures.  Hematological: Negative for adenopathy. Does not bruise/bleed easily.  Psychiatric/Behavioral: Negative for confusion, depression and sleep disturbance. The patient is not nervous/anxious.     PHYSICAL EXAMINATION:  There were no vitals taken for this visit.  ECOG PERFORMANCE STATUS: {CHL ONC ECOG [id number]  Physical Exam  Constitutional: Oriented to person, place, and time and well-developed, well-nourished, and in no distress. No distress.  HENT:  Head: Normocephalic and atraumatic.  Mouth/Throat: Oropharynx is clear and moist. No oropharyngeal exudate.  Eyes: Conjunctivae are normal. Right eye exhibits no discharge. Left eye exhibits no discharge. No scleral icterus.  Neck: Normal range of motion. Neck supple.  Cardiovascular: Normal rate, regular rhythm, normal heart sounds and intact distal pulses.   Pulmonary/Chest: Effort normal and breath sounds normal. No respiratory distress. No wheezes. No rales.  Abdominal: Soft. Bowel sounds are normal. Exhibits no distension and no mass. There is no tenderness.  Musculoskeletal: Normal range of motion. Exhibits no edema.  Lymphadenopathy:    No cervical adenopathy.  Neurological: Alert and oriented to person, place, and time. Exhibits normal muscle  tone. Gait normal. Coordination normal.  Skin: Skin is warm and dry. No rash noted. Not diaphoretic. No erythema. No pallor.  Psychiatric: Mood, memory and judgment normal.  Vitals reviewed.  LABORATORY DATA: Lab Results  Component Value Date   WBC 8.4 08/19/2021   HGB 11.7 (L) 08/19/2021   HCT 36.8 08/19/2021   MCV 83.8 08/19/2021   PLT 486 (H) 08/19/2021      Chemistry      Component Value Date/Time   NA 135 08/19/2021 1452   K 4.0 08/19/2021 1452   CL 95 (L) 08/19/2021 1452   CO2 29 08/19/2021 1452   BUN 22 08/19/2021 1452   CREATININE 0.67 08/19/2021 1452      Component Value Date/Time   CALCIUM 9.7 08/19/2021 1452    ALKPHOS 83 08/19/2021 1452   AST 14 (L) 08/19/2021 1452   ALT 11 08/19/2021 1452   BILITOT 0.3 08/19/2021 1452       RADIOGRAPHIC STUDIES:  No results found.   ASSESSMENT/PLAN:  This is a very pleasant 76 year old African-American female diagnosed with stage IV (T3, N2,M1 C) non-small cell lung cancer, adenocarcinoma.  She presented with a left lower lobe lung mass in addition to subcarinal adenopathy.  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  She was diagnosed in April 2023.  Her molecular studies show that she is positive for an EGFR mutation with deletion in exon 19 in addition to T790 and.  Her PD-L1 expression is 50 percent.   Therefore the patient is currently undergoing targeted treatment with Tagrisso 80 mg p.o. daily rather than immunotherapy.   Her first dose of treatment was on 05/22/21.  She is tolerating this well.  She was prescribed Remeron.  She was also referred to the dietitian.  She missed her appointment***.   I will arrange for restaging CT scan of the chest abdomen, pelvis prior to her next appointment in 6 weeks..  We will see her back for follow-up visit in 6 weeks for evaluation and repeat blood work.   The patient is not ambulatory since her diagnosis.  Per chart review, it appears she has been seen by a physical therapist in July 2023.  I previously gave the family the number to get back in touch with physical therapy.    The patient completed the 73-monthcourse of Eliquis that was prescribed for the incidental thrombus noted on her brain MRI from 04/19/2021.   The patient has antiemetics at home if needed   No orders of the defined types were placed in this encounter.    I spent {CHL ONC TIME VISIT - SCYELY:[id number]}counseling the patient face to face. The total time spent in the appointment was {CHL ONC TIME VISIT - SKOECX:[id number]}  Oiva Dibari L Jonathan Kirkendoll, PA-C 10/01/21

## 2021-10-01 NOTE — Telephone Encounter (Signed)
Scheduled per 9/15 in basket, tried to call but voicemail full, will try to call again

## 2021-10-02 ENCOUNTER — Other Ambulatory Visit: Payer: Self-pay

## 2021-10-05 ENCOUNTER — Inpatient Hospital Stay: Payer: Medicare Other | Admitting: Physician Assistant

## 2021-10-05 ENCOUNTER — Inpatient Hospital Stay: Payer: Medicare Other

## 2021-10-18 ENCOUNTER — Other Ambulatory Visit: Payer: Self-pay

## 2021-10-18 ENCOUNTER — Other Ambulatory Visit (HOSPITAL_COMMUNITY): Payer: Self-pay

## 2021-10-21 ENCOUNTER — Other Ambulatory Visit (HOSPITAL_COMMUNITY): Payer: Self-pay

## 2021-10-27 ENCOUNTER — Other Ambulatory Visit (HOSPITAL_COMMUNITY): Payer: Self-pay

## 2021-10-28 ENCOUNTER — Other Ambulatory Visit: Payer: Self-pay

## 2021-10-29 ENCOUNTER — Other Ambulatory Visit: Payer: Self-pay

## 2021-11-02 NOTE — Progress Notes (Signed)
[facility] OFFICE PROGRESS NOTE  Anna Mccreedy, MD [address] [place] [address]  DIAGNOSIS:  Stage IV (T3, N2, M1 C) non-small cell lung cancer, adenocarcinoma presented with large right lower lobe lung mass in addition to subcarinal lymphadenopathy as well as bone metastasis diagnosed in April 2023   Molecular studies by foundation 1 showed EGFR T790M, Exon 19 deletion [id number]) AKT2- Aplification-equivocal ARID1A- R1748f*1 RICTOR- amplification-equivocal   PDL1 Expression 50%  PRIOR THERAPY: Palliative radiotherapy to the metastatic T3 bone lesion under the care of Dr. MLisbeth Finley CURRENT THERAPY: Tagrisso 80 mg p.o. daily.  First dose on ~5/6  INTERVAL HISTORY: Anna Hefty89y.o. female returns to the clinic today for a follow-up visit accompanied by her granddaughter. The patient was last seen in the clinic on 08/19/2021.  The patient is noncompliant and misses numerous appointments.  A discharge letter was sent to her house.   As we have previously discussed, although her treatment is oral, we still need to be monitoring her closely for adverse side effects of treatment. Therefore, it is very important that she is compliant with her routine follow-up visits and lab appointments to monitor for any toxicities.  Since last being seen, the patient denies any changes in her health. She reports she is getting over a mild cold/chest congestion.  She has been drinking tea and using her inhalers if needed, which helps. Denies fevers, shortness of breath, chest pain, or sore throat. Denies sick contacts.   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 She did complete palliative radiation to T3.  She was also previously referred to physical therapy. She is not ambulatory but is able to  move her lower extremities and does lower extremity exercises as outline by PT at home. She was supposed to be fitted for an eIT trainerwheelchair.  Her family picks her up to move around the house.  Regarding her targeted treatment with Tagrisso, she is tolerating this well. She states despite being lost to follow up, that she has been taking her Tagrisso. She has poor nutrition and is very cachectic appearing at baseline. She reports a good appetite though.  The patient's granddaughter is going to pick up supplemental protein drinks today.  Denies any nausea, vomiting, diarrhea, or constipation.  Denies any rashes or skin changes.  She does report some intermittent headaches.  The patient's initial staging brain MRI was negative.  She is here today for evaluation and repeat blood work.     MEDICAL HISTORY: Past Medical History:  Diagnosis Date   COPD (chronic obstructive pulmonary disease) with chronic bronchitis (HCC)    HTN (hypertension)    Pneumothorax     ALLERGIES:  has No Known Allergies.  MEDICATIONS:  Current Outpatient Medications  Medication Sig Dispense Refill   acetaminophen (TYLENOL) 500 MG tablet Take 500 mg by mouth every 6 (six) hours as needed for moderate pain or headache.     apixaban (ELIQUIS) 5 MG TABS tablet Take 1 tablet (5 mg total) by mouth 2 (two) times daily. Follow up with Dr. MJulien Nordmannfor additional refills 60 tablet 1   atenolol (TENORMIN) 50 MG tablet Take 1 tablet (50 mg total) by mouth daily. 30 tablet 0   atenolol-chlorthalidone (TENORETIC) 50-25 MG tablet Take 1 tablet by mouth daily.     atorvastatin (  LIPITOR) 40 MG tablet Take 40 mg by mouth daily.     dexamethasone (DECADRON) 4 MG tablet See Taper instruction sheet 30 tablet 1   gabapentin (NEURONTIN) 300 MG capsule Take 300 mg by mouth 2 (two) times daily.     metFORMIN (GLUCOPHAGE) 500 MG tablet Take 500 mg by mouth daily.     mirtazapine (REMERON) 15 MG tablet Take 1 tablet (15 mg total) by mouth  at bedtime. 30 tablet 2   montelukast (SINGULAIR) 10 MG tablet Take 10 mg by mouth at bedtime.     osimertinib mesylate (TAGRISSO) 80 MG tablet Take 1 tablet (80 mg total) by mouth daily. 30 tablet 3   polyethylene glycol (MIRALAX / GLYCOLAX) 17 g packet Take 17 g by mouth daily as needed for mild constipation. Meds to bed, thanks 14 each 0   potassium chloride SA (KLOR-CON M) 20 MEQ tablet Take 1 tablet (20 mEq total) by mouth daily. 5 tablet 0   promethazine-dextromethorphan (PROMETHAZINE-DM) 6.25-15 MG/5ML syrup Take 5 mLs by mouth 4 (four) times daily as needed for cough.     WIXELA INHUB 250-50 MCG/ACT AEPB 1 puff 2 (two) times daily.     No current facility-administered medications for this visit.    SURGICAL HISTORY:  Past Surgical History:  Procedure Laterality Date   BRONCHIAL NEEDLE ASPIRATION BIOPSY  04/20/2021   Procedure: BRONCHIAL NEEDLE ASPIRATION BIOPSIES;  Surgeon: Candee Furbish, MD;  Location: [facility];  Service: Endoscopy;;   BRONCHIAL WASHINGS  04/20/2021   Procedure: BRONCHIAL WASHINGS;  Surgeon: Candee Furbish, MD;  Location: [facility];  Service: Endoscopy;;   ENDOBRONCHIAL ULTRASOUND N/A 04/20/2021   Procedure: ENDOBRONCHIAL ULTRASOUND;  Surgeon: Candee Furbish, MD;  Location: [facility];  Service: Endoscopy;  Laterality: N/A;   HIP ARTHROPLASTY     VIDEO BRONCHOSCOPY Left 04/20/2021   Procedure: VIDEO BRONCHOSCOPY WITH FLUORO;  Surgeon: Candee Furbish, MD;  Location: [facility];  Service: Endoscopy;  Laterality: Left;  EBUS    REVIEW OF SYSTEMS:   Review of Systems  Constitutional: Positive for fatigue and generalized weakness. Negative for appetite change, chills,fever and unexpected weight change.  HENT: Negative for mouth sores, nosebleeds, sore throat and trouble swallowing.   Eyes: Negative for eye problems and icterus.  Respiratory: Positive for cough. Positive for occasional wheezing.  Negative for hemoptysis. Negative for shortness of breath.    Cardiovascular: Negative for chest pain and leg swelling.  Gastrointestinal: Negative for abdominal pain, nausea, vomiting, diarrhea, and constipation.   Genitourinary: Negative for bladder incontinence, difficulty urinating, dysuria, frequency and hematuria.   Musculoskeletal: Positive for continued lower extremity weakness.  Negative for back pain, gait problem, neck pain and neck stiffness.  Skin: Negative for itching and rash.  Neurological: Negative for dizziness, extremity weakness, gait problem, headaches, light-headedness and seizures.  Hematological: Negative for adenopathy. Does not bruise/bleed easily.  Psychiatric/Behavioral: Negative for confusion, depression and sleep disturbance. The patient is not nervous/anxious.   PHYSICAL EXAMINATION:  There were no vitals taken for this visit.  ECOG PERFORMANCE STATUS: 3  Physical Exam  Constitutional: Oriented to person, place, and time and cachectic appearing female and in no distress.  HENT:  Head: Normocephalic and atraumatic.  Mouth/Throat: Oropharynx is clear and moist. No oropharyngeal exudate.  Eyes: Conjunctivae are normal. Right eye exhibits no discharge. Left eye exhibits no discharge. No scleral icterus.  Neck: Normal range of motion. Neck supple.  Cardiovascular: Normal rate, regular rhythm, normal heart sounds and intact distal pulses.  Pulmonary/Chest: Effort normal.  Quiet breath sounds bilaterally with minor respiratory wheezing.  No respiratory distress. No rales.  Abdominal: Soft. Bowel sounds are normal. Exhibits no distension and no mass. There is no tenderness.  Musculoskeletal: Normal range of motion. Exhibits no edema.  Able to move her lower extremities in the wheelchair but unable to bear weight without assistance. Lymphadenopathy:    No cervical adenopathy.  Neurological: Alert and oriented to person, place, and time. Exhibits also wasting.  The patient was examined in the wheelchair today.  Skin: Skin is  warm and dry. No rash noted. Not diaphoretic. No erythema. No pallor.  Some radiation related skin darkening noted on her chest. Psychiatric: Mood, memory and judgment normal.  Vitals reviewed.  LABORATORY DATA: Lab Results  Component Value Date   WBC 8.4 08/19/2021   HGB 11.7 (L) 08/19/2021   HCT 36.8 08/19/2021   MCV 83.8 08/19/2021   PLT 486 (H) 08/19/2021      Chemistry      Component Value Date/Time   NA 135 08/19/2021 1452   K 4.0 08/19/2021 1452   CL 95 (L) 08/19/2021 1452   CO2 29 08/19/2021 1452   BUN 22 08/19/2021 1452   CREATININE 0.67 08/19/2021 1452      Component Value Date/Time   CALCIUM 9.7 08/19/2021 1452   ALKPHOS 83 08/19/2021 1452   AST 14 (L) 08/19/2021 1452   ALT 11 08/19/2021 1452   BILITOT 0.3 08/19/2021 1452       RADIOGRAPHIC STUDIES:  No results found.   ASSESSMENT/PLAN:  This is a very pleasant 76 year old African-American female diagnosed with stage IV (T3, N2, M1 C) non-small cell lung cancer, adenocarcinoma.  She presented with a left lower lobe lung mass in addition to subcarinal adenopathy.  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  She was diagnosed in April 2023.  Her molecular studies show that she is positive for an EGFR mutation with deletion in exon 19 in addition to T790 and.  Her PD-L1 expression is 50 percent.   Therefore the patient is currently undergoing targeted treatment with Tagrisso 80 mg p.o. daily rather than immunotherapy.   Her first dose of treatment was on 05/22/21.  She is tolerating this well.  She was prescribed Remeron.  She was also referred to the dietitian.  She missed her appointment.   The patient was seen with Dr. Julien Finley today.  Dr. Julien Finley again discussed the importance of close monitoring while on targeted treatment to ensure no toxicities.  The patient had been lost to follow-up for several weeks.  Dr. Julien Finley recommends a restaging CT scan of the chest, abdomen,  and pelvis in the next 2 weeks.  We will see her back for follow-up visit in 2 weeks for evaluation and to review the results of her scan.  The patient's potassium is low at 3.0 today.  I sent a prescription for potassium chloride 20 mill equivalents twice daily for 7 days to her pharmacy.  The patient is endorsing some headaches.  Dr. Julien Finley recommend that she take Tylenol and ibuprofen if needed.  The patient's initial brain MRI was negative for metastatic disease.  Dr. Julien Finley does not recommend any neuroimaging at this time as Tagrisso crosses the blood-brain barrier.   Encouraged the patient to pick up Glucerna supplemental drinks.  She will continue to use her inhaler and tea for her cough related to her recent cold.  We will also reassess her lungs  on upcoming imaging studies.  Discussed that she may use Delsym for cough.  The patient is not reliable and has missed several appointments.  Discussed this with the patient's granddaughter who is going to ensure that the patient comes to her appointments and imaging studies as recommended.  The patient was advised to call immediately if she has any concerning symptoms in the interval. The patient voices understanding of current disease status and treatment options and is in agreement with the current care plan. All questions were answered. The patient knows to call the clinic with any problems, questions or concerns. We can certainly see the patient much sooner if necessary  No orders of the defined types were placed in this encounter.   Besnik Febus L Sameria Morss, PA-C 11/02/21  ADDENDUM: Hematology/Oncology Attending: I had a face-to-face encounter with the patient today.  I reviewed her records, lab and recommended her care plan.  This is a 76 years old African-American female diagnosed with a stage IV non-small cell lung cancer, adenocarcinoma with positive EGFR mutation with exon 19 deletion in addition to T790M and PD-L1 expression  50%. The patient is status post palliative radiotherapy to metastatic bone disease.  She is currently undergoing treatment with targeted therapy with Tagrisso 80 mg p.o. daily.  The patient is very noncompliant with her visits and follow-up for lab.  She has been on the treatment and tolerating it fairly well.  I had a lengthy discussion with the patient and her granddaughter about her condition and the need for compliance with the visit and evaluation especially when she is taking treatment for lung cancer. I recommended for the patient to have repeat CT scan of the chest, abdomen and pelvis in less than 2 weeks and she will come back for follow-up visit at that time. She was advised to call immediately if she has any other concerning symptoms in the interval. The total time spent in the appointment was 30 minutes. Disclaimer: This note was dictated with voice recognition software. Similar sounding words can inadvertently be transcribed and may be missed upon review. Eilleen Kempf, MD

## 2021-11-04 ENCOUNTER — Inpatient Hospital Stay: Payer: Medicare Other | Attending: Physician Assistant

## 2021-11-04 ENCOUNTER — Inpatient Hospital Stay (HOSPITAL_BASED_OUTPATIENT_CLINIC_OR_DEPARTMENT_OTHER): Payer: Medicare Other | Admitting: Physician Assistant

## 2021-11-04 ENCOUNTER — Other Ambulatory Visit: Payer: Self-pay

## 2021-11-04 VITALS — BP 117/73 | HR 87 | Temp 98.3°F | Resp 18

## 2021-11-04 DIAGNOSIS — C7971 Secondary malignant neoplasm of right adrenal gland: Secondary | ICD-10-CM | POA: Diagnosis not present

## 2021-11-04 DIAGNOSIS — C3431 Malignant neoplasm of lower lobe, right bronchus or lung: Secondary | ICD-10-CM

## 2021-11-04 DIAGNOSIS — Z7189 Other specified counseling: Secondary | ICD-10-CM

## 2021-11-04 DIAGNOSIS — Z79899 Other long term (current) drug therapy: Secondary | ICD-10-CM | POA: Insufficient documentation

## 2021-11-04 DIAGNOSIS — C7951 Secondary malignant neoplasm of bone: Secondary | ICD-10-CM | POA: Diagnosis not present

## 2021-11-04 DIAGNOSIS — E876 Hypokalemia: Secondary | ICD-10-CM

## 2021-11-04 LAB — CBC WITH DIFFERENTIAL (CANCER CENTER ONLY)
Abs Immature Granulocytes: 0.02 10*3/uL (ref 0.00–0.07)
Basophils Absolute: 0 10*3/uL (ref 0.0–0.1)
Basophils Relative: 0 %
Eosinophils Absolute: 0 10*3/uL (ref 0.0–0.5)
Eosinophils Relative: 0 %
HCT: 33.7 % — ABNORMAL LOW (ref 36.0–46.0)
Hemoglobin: 11.3 g/dL — ABNORMAL LOW (ref 12.0–15.0)
Immature Granulocytes: 0 %
Lymphocytes Relative: 10 %
Lymphs Abs: 0.8 10*3/uL (ref 0.7–4.0)
MCH: 27.8 pg (ref 26.0–34.0)
MCHC: 33.5 g/dL (ref 30.0–36.0)
MCV: 82.8 fL (ref 80.0–100.0)
Monocytes Absolute: 0.4 10*3/uL (ref 0.1–1.0)
Monocytes Relative: 6 %
Neutro Abs: 6.4 10*3/uL (ref 1.7–7.7)
Neutrophils Relative %: 84 %
Platelet Count: 455 10*3/uL — ABNORMAL HIGH (ref 150–400)
RBC: 4.07 MIL/uL (ref 3.87–5.11)
RDW: 16.7 % — ABNORMAL HIGH (ref 11.5–15.5)
WBC Count: 7.6 10*3/uL (ref 4.0–10.5)
nRBC: 0 % (ref 0.0–0.2)

## 2021-11-04 LAB — CMP (CANCER CENTER ONLY)
ALT: 11 U/L (ref 0–44)
AST: 17 U/L (ref 15–41)
Albumin: 3.1 g/dL — ABNORMAL LOW (ref 3.5–5.0)
Alkaline Phosphatase: 80 U/L (ref 38–126)
Anion gap: 12 (ref 5–15)
BUN: 18 mg/dL (ref 8–23)
CO2: 28 mmol/L (ref 22–32)
Calcium: 8.9 mg/dL (ref 8.9–10.3)
Chloride: 96 mmol/L — ABNORMAL LOW (ref 98–111)
Creatinine: 0.74 mg/dL (ref 0.44–1.00)
GFR, Estimated: >60 mL/min (ref >60)
Glucose, Bld: 112 mg/dL — ABNORMAL HIGH (ref 70–99)
Potassium: 3 mmol/L — ABNORMAL LOW (ref 3.5–5.1)
Sodium: 136 mmol/L (ref 135–145)
Total Bilirubin: 0.5 mg/dL (ref 0.3–1.2)
Total Protein: 7.1 g/dL (ref 6.5–8.1)

## 2021-11-04 MED ORDER — POTASSIUM CHLORIDE CRYS ER 20 MEQ PO TBCR: 20.0000 meq | EXTENDED_RELEASE_TABLET | Freq: Two times a day (BID) | ORAL | 0 refills | Status: DC

## 2021-11-05 ENCOUNTER — Other Ambulatory Visit: Payer: Self-pay

## 2021-11-05 ENCOUNTER — Encounter: Payer: Self-pay | Admitting: Internal Medicine

## 2021-11-05 LAB — TSH: TSH: 0.934 u[IU]/mL (ref 0.350–4.500)

## 2021-11-06 ENCOUNTER — Other Ambulatory Visit: Payer: Self-pay

## 2021-11-18 ENCOUNTER — Ambulatory Visit (HOSPITAL_COMMUNITY): Admission: RE | Admit: 2021-11-18 | Payer: Medicare Other | Source: Ambulatory Visit

## 2021-11-19 ENCOUNTER — Telehealth: Payer: Self-pay | Admitting: Physician Assistant

## 2021-11-19 NOTE — Telephone Encounter (Signed)
The patient no-shows/misses multiple appointments.  She missed her CT scan appointment earlier today.  I attempted to call all of her emergency contacts on file to help facilitate getting this rescheduled.  Many of the numbers do not have voicemail set up.  I was able to leave a voicemail for her daughter, Anna Finley, with instructions on how to go about rescheduling her scan.  Ideally, we would like to have the CT scan before her follow-up appointment with Dr. Julien Nordmann next week on 11/7.  I left our callback number if they had any questions about the instructions left.

## 2021-11-23 ENCOUNTER — Other Ambulatory Visit (HOSPITAL_COMMUNITY): Payer: Self-pay

## 2021-11-23 ENCOUNTER — Other Ambulatory Visit: Payer: Self-pay

## 2021-11-23 ENCOUNTER — Inpatient Hospital Stay: Payer: Medicare Other | Attending: Physician Assistant

## 2021-11-23 ENCOUNTER — Inpatient Hospital Stay: Payer: Medicare Other | Admitting: Internal Medicine

## 2021-11-25 ENCOUNTER — Other Ambulatory Visit (HOSPITAL_COMMUNITY): Payer: Self-pay

## 2021-12-17 ENCOUNTER — Other Ambulatory Visit: Payer: Self-pay | Admitting: Internal Medicine

## 2021-12-17 ENCOUNTER — Other Ambulatory Visit (HOSPITAL_COMMUNITY): Payer: Self-pay

## 2021-12-20 ENCOUNTER — Other Ambulatory Visit: Payer: Self-pay | Admitting: Internal Medicine

## 2021-12-20 ENCOUNTER — Other Ambulatory Visit (HOSPITAL_COMMUNITY): Payer: Self-pay

## 2021-12-21 ENCOUNTER — Other Ambulatory Visit (HOSPITAL_COMMUNITY): Payer: Self-pay

## 2021-12-22 ENCOUNTER — Other Ambulatory Visit (HOSPITAL_COMMUNITY): Payer: Self-pay

## 2021-12-29 ENCOUNTER — Other Ambulatory Visit (HOSPITAL_COMMUNITY): Payer: Self-pay

## 2021-12-29 ENCOUNTER — Telehealth: Payer: Self-pay | Admitting: Pharmacy Technician

## 2021-12-29 NOTE — Telephone Encounter (Signed)
Oral Oncology Patient Advocate Encounter  Prior Authorization for [employer] has been approved.    PA# [id number] Effective dates: 12/29/21 through 01/17/23  Patients co-pay is $0   Lady Deutscher, CPhT-Adv Oncology Pharmacy Patient [place] Direct Number: [phone]  Fax: [phone]

## 2021-12-29 NOTE — Telephone Encounter (Signed)
Oral Oncology Patient Advocate Encounter   Received notification that prior authorization for Tagrisso is due for renewal.   PA submitted on 12/29/21 Key [id number] Status is pending     Anna Finley, [facility] Patient [place] Direct Number: [phone]  Fax: [phone]

## 2022-01-07 ENCOUNTER — Other Ambulatory Visit (HOSPITAL_COMMUNITY): Payer: Self-pay

## 2022-01-09 ENCOUNTER — Other Ambulatory Visit: Payer: Self-pay

## 2022-01-11 ENCOUNTER — Other Ambulatory Visit (HOSPITAL_COMMUNITY): Payer: Self-pay

## 2022-01-19 ENCOUNTER — Inpatient Hospital Stay (HOSPITAL_BASED_OUTPATIENT_CLINIC_OR_DEPARTMENT_OTHER): Payer: 59 | Admitting: Internal Medicine

## 2022-01-19 ENCOUNTER — Inpatient Hospital Stay: Payer: 59 | Attending: Physician Assistant

## 2022-01-19 ENCOUNTER — Other Ambulatory Visit: Payer: Self-pay

## 2022-01-19 VITALS — BP 139/81 | HR 119 | Temp 98.3°F | Resp 16 | Wt 87.9 lb

## 2022-01-19 DIAGNOSIS — C7972 Secondary malignant neoplasm of left adrenal gland: Secondary | ICD-10-CM | POA: Diagnosis not present

## 2022-01-19 DIAGNOSIS — C7951 Secondary malignant neoplasm of bone: Secondary | ICD-10-CM | POA: Insufficient documentation

## 2022-01-19 DIAGNOSIS — C3431 Malignant neoplasm of lower lobe, right bronchus or lung: Secondary | ICD-10-CM

## 2022-01-19 DIAGNOSIS — Z79899 Other long term (current) drug therapy: Secondary | ICD-10-CM | POA: Diagnosis not present

## 2022-01-19 LAB — CBC WITH DIFFERENTIAL (CANCER CENTER ONLY)
Abs Immature Granulocytes: 0.01 10*3/uL (ref 0.00–0.07)
Basophils Absolute: 0 10*3/uL (ref 0.0–0.1)
Basophils Relative: 0 %
Eosinophils Absolute: 0.1 10*3/uL (ref 0.0–0.5)
Eosinophils Relative: 2 %
HCT: 32.2 % — ABNORMAL LOW (ref 36.0–46.0)
Hemoglobin: 10.2 g/dL — ABNORMAL LOW (ref 12.0–15.0)
Immature Granulocytes: 0 %
Lymphocytes Relative: 19 %
Lymphs Abs: 1 10*3/uL (ref 0.7–4.0)
MCH: 28.5 pg (ref 26.0–34.0)
MCHC: 31.7 g/dL (ref 30.0–36.0)
MCV: 89.9 fL (ref 80.0–100.0)
Monocytes Absolute: 0.5 10*3/uL (ref 0.1–1.0)
Monocytes Relative: 9 %
Neutro Abs: 3.8 10*3/uL (ref 1.7–7.7)
Neutrophils Relative %: 70 %
Platelet Count: 348 10*3/uL (ref 150–400)
RBC: 3.58 MIL/uL — ABNORMAL LOW (ref 3.87–5.11)
RDW: 16.4 % — ABNORMAL HIGH (ref 11.5–15.5)
WBC Count: 5.4 10*3/uL (ref 4.0–10.5)
nRBC: 0 % (ref 0.0–0.2)

## 2022-01-19 LAB — CMP (CANCER CENTER ONLY)
ALT: 13 U/L (ref 0–44)
AST: 19 U/L (ref 15–41)
Albumin: 3.4 g/dL — ABNORMAL LOW (ref 3.5–5.0)
Alkaline Phosphatase: 107 U/L (ref 38–126)
Anion gap: 10 (ref 5–15)
BUN: 11 mg/dL (ref 8–23)
CO2: 26 mmol/L (ref 22–32)
Calcium: 8.9 mg/dL (ref 8.9–10.3)
Chloride: 102 mmol/L (ref 98–111)
Creatinine: 0.74 mg/dL (ref 0.44–1.00)
GFR, Estimated: >60 mL/min (ref >60)
Glucose, Bld: 112 mg/dL — ABNORMAL HIGH (ref 70–99)
Potassium: 3.4 mmol/L — ABNORMAL LOW (ref 3.5–5.1)
Sodium: 138 mmol/L (ref 135–145)
Total Bilirubin: 0.6 mg/dL (ref 0.3–1.2)
Total Protein: 7 g/dL (ref 6.5–8.1)

## 2022-01-19 NOTE — Progress Notes (Signed)
[facility] [phone]   [phone]  OFFICE PROGRESS NOTE  [name], MD [address] [place] [address]  DIAGNOSIS: Stage IV (T3, N2, M1 C) non-small cell lung cancer, adenocarcinoma presented with large right lower lobe lung mass in addition to subcarinal lymphadenopathy as well as bone metastasis diagnosed in April 2023  Molecular studies by foundation 1 showed EGFR T790M, Exon 19 deletion ([id number]) AKT2- Aplification-equivocal ARID1A- R1728f*1 RICTOR- amplification-equivocal  PDL1 Expression 50%  PRIOR THERAPY: Palliative radiotherapy to the metastatic T3 bone lesion under the care of Dr. MLisbeth Renshaw CURRENT THERAPY: Tagrisso 80 mg p.o. daily.  First dose started May 22, 2021.   INTERVAL HISTORY: Anna Drummonds77y.o. female returns to the clinic today for follow-up visit accompanied by her daughter Anna Finley  The patient was lost to follow-up several times recently and she is not compliant with her visits as well as lab and scan appointments.  She was supposed to have repeat CT scan of the chest in early November 2023 but she did not show up for the scan appointment or the follow-up appointment after the scan.  She recently started running out of her treatment with Tagrisso and she called the office for refill which was denied because of the noncompliance.  She came today accompanied by her daughter for reevaluation.  She denied having any current nausea, vomiting, diarrhea or constipation.  She continues to have occasional headache with no fever or chills.  She has no chest pain, shortness of breath, cough or hemoptysis.  She has been off her medication for 1 week now.  MEDICAL HISTORY: Past Medical History:  Diagnosis Date   COPD (chronic obstructive pulmonary disease) with chronic bronchitis (HCC)    HTN (hypertension)    Pneumothorax     ALLERGIES:  has No Known Allergies.  MEDICATIONS:  Current  Outpatient Medications  Medication Sig Dispense Refill   acetaminophen (TYLENOL) 500 MG tablet Take 500 mg by mouth every 6 (six) hours as needed for moderate pain or headache.     apixaban (ELIQUIS) 5 MG TABS tablet Take 1 tablet (5 mg total) by mouth 2 (two) times daily. Follow up with Dr. MJulien Nordmannfor additional refills 60 tablet 1   atenolol (TENORMIN) 50 MG tablet Take 1 tablet (50 mg total) by mouth daily. 30 tablet 0   atenolol-chlorthalidone (TENORETIC) 50-25 MG tablet Take 1 tablet by mouth daily.     atorvastatin (LIPITOR) 40 MG tablet Take 40 mg by mouth daily.     dexamethasone (DECADRON) 4 MG tablet See Taper instruction sheet 30 tablet 1   gabapentin (NEURONTIN) 300 MG capsule Take 300 mg by mouth 2 (two) times daily.     metFORMIN (GLUCOPHAGE) 500 MG tablet Take 500 mg by mouth daily.     mirtazapine (REMERON) 15 MG tablet Take 1 tablet (15 mg total) by mouth at bedtime. 30 tablet 2   montelukast (SINGULAIR) 10 MG tablet Take 10 mg by mouth at bedtime.     osimertinib mesylate (TAGRISSO) 80 MG tablet Take 1 tablet (80 mg total) by mouth daily. 30 tablet 3   polyethylene glycol (MIRALAX / GLYCOLAX) 17 g packet Take 17 g by mouth daily as needed for mild constipation. Meds to bed, thanks 14 each 0   potassium chloride SA (KLOR-CON M) 20 MEQ tablet Take 1 tablet (20 mEq total) by mouth 2 (two) times daily. 14 tablet 0   promethazine-dextromethorphan (PROMETHAZINE-DM) 6.25-15 MG/5ML  syrup Take 5 mLs by mouth 4 (four) times daily as needed for cough.     WIXELA INHUB 250-50 MCG/ACT AEPB 1 puff 2 (two) times daily.     No current facility-administered medications for this visit.    SURGICAL HISTORY:  Past Surgical History:  Procedure Laterality Date   BRONCHIAL NEEDLE ASPIRATION BIOPSY  04/20/2021   Procedure: BRONCHIAL NEEDLE ASPIRATION BIOPSIES;  Surgeon: Candee Furbish, MD;  Location: [facility];  Service: Endoscopy;;   BRONCHIAL WASHINGS  04/20/2021   Procedure: BRONCHIAL  WASHINGS;  Surgeon: Candee Furbish, MD;  Location: [facility];  Service: Endoscopy;;   ENDOBRONCHIAL ULTRASOUND N/A 04/20/2021   Procedure: ENDOBRONCHIAL ULTRASOUND;  Surgeon: Candee Furbish, MD;  Location: [facility];  Service: Endoscopy;  Laterality: N/A;   HIP ARTHROPLASTY     VIDEO BRONCHOSCOPY Left 04/20/2021   Procedure: VIDEO BRONCHOSCOPY WITH FLUORO;  Surgeon: Candee Furbish, MD;  Location: [facility];  Service: Endoscopy;  Laterality: Left;  EBUS    REVIEW OF SYSTEMS:  Constitutional: positive for fatigue and weight loss Eyes: negative Ears, nose, mouth, throat, and face: negative Respiratory: negative Cardiovascular: negative Gastrointestinal: negative Genitourinary:negative Integument/breast: negative Hematologic/lymphatic: negative Musculoskeletal:positive for muscle weakness Neurological: positive for headaches Behavioral/Psych: negative Endocrine: negative Allergic/Immunologic: negative   PHYSICAL EXAMINATION: General appearance: alert, cooperative, appears older than stated age, cachectic, and fatigued Head: Normocephalic, without obvious abnormality, atraumatic Neck: no adenopathy, no JVD, supple, symmetrical, trachea midline, and thyroid not enlarged, symmetric, no tenderness/mass/nodules Lymph nodes: Cervical, supraclavicular, and axillary nodes normal. Resp: clear to auscultation bilaterally Back: symmetric, no curvature. ROM normal. No CVA tenderness. Cardio: regular rate and rhythm, S1, S2 normal, no murmur, click, rub or gallop GI: soft, non-tender; bowel sounds normal; no masses,  no organomegaly Extremities: extremities normal, atraumatic, no cyanosis or edema Neurologic: Alert and oriented X 3, normal strength and tone. Normal symmetric reflexes. Normal coordination and gait  ECOG PERFORMANCE STATUS: 1 - Symptomatic but completely ambulatory  Blood pressure 139/81, pulse (!) 119, temperature 98.3 F (36.8 C), temperature source Oral, resp. rate 16,  weight 87 lb 14.4 oz (39.9 kg), SpO2 100 %.  LABORATORY DATA: Lab Results  Component Value Date   WBC 7.6 11/04/2021   HGB 11.3 (L) 11/04/2021   HCT 33.7 (L) 11/04/2021   MCV 82.8 11/04/2021   PLT 455 (H) 11/04/2021      Chemistry      Component Value Date/Time   NA 136 11/04/2021 1524   K 3.0 (L) 11/04/2021 1524   CL 96 (L) 11/04/2021 1524   CO2 28 11/04/2021 1524   BUN 18 11/04/2021 1524   CREATININE 0.74 11/04/2021 1524      Component Value Date/Time   CALCIUM 8.9 11/04/2021 1524   ALKPHOS 80 11/04/2021 1524   AST 17 11/04/2021 1524   ALT 11 11/04/2021 1524   BILITOT 0.5 11/04/2021 1524       RADIOGRAPHIC STUDIES: No results found.  ASSESSMENT AND PLAN: This is a 77 years old African-American female diagnosed with Stage IV (T3, N2, M1 C) non-small cell lung cancer, adenocarcinoma presented with large right lower lobe lung mass in addition to subcarinal lymphadenopathy as well as right adrenal gland and bone metastasis diagnosed in April 2023.   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   PD-L1 expression of 50%. The patient started her treatment with Tagrisso in May 2023 and has been tolerating this treatment well.  She has been  very noncompliant with her appointments for the follow-up visit, lab and scan.  She is now running out of her medication and presented to the clinic for evaluation. I recommended for the patient to have repeat CT scan of the head, chest, abdomen and pelvis for restaging of her disease before resuming her medication.  We will arrange for her scan to be done early next week and the patient will come back for follow-up visit few days after the scan.  If the scan showed no concerning findings for disease progression, I will give her a refill of Tagrisso. The patient and her daughter understand that if she continues to miss future appointments that we will discharge her from the practice because of the  noncompliance. The patient was advised to call immediately if she has any other concerning symptoms in the interval.  All questions were answered. The patient knows to call the clinic with any problems, questions or concerns. We can certainly see the patient much sooner if necessary. The total time spent in the appointment was 30 minutes.  Disclaimer: This note was dictated with voice recognition software. Similar sounding words can inadvertently be transcribed and may not be corrected upon review.

## 2022-01-20 ENCOUNTER — Other Ambulatory Visit: Payer: Self-pay

## 2022-01-20 LAB — TSH: TSH: 1.072 u[IU]/mL (ref 0.350–4.500)

## 2022-01-21 ENCOUNTER — Other Ambulatory Visit (HOSPITAL_COMMUNITY): Payer: Self-pay

## 2022-01-22 ENCOUNTER — Encounter: Payer: Self-pay | Admitting: Internal Medicine

## 2022-01-27 ENCOUNTER — Ambulatory Visit (HOSPITAL_COMMUNITY)
Admission: RE | Admit: 2022-01-27 | Discharge: 2022-01-27 | Disposition: A | Discharge status: Home / Self Care | Payer: 59 | Source: Ambulatory Visit | Attending: Physician Assistant | Admitting: Physician Assistant

## 2022-01-27 DIAGNOSIS — C3431 Malignant neoplasm of lower lobe, right bronchus or lung: Secondary | ICD-10-CM | POA: Insufficient documentation

## 2022-01-27 MED ORDER — SODIUM CHLORIDE (PF) 0.9 % IJ SOLN
INTRAMUSCULAR | Status: AC
  Filled 2022-01-27: qty 50

## 2022-01-27 MED ORDER — IOHEXOL 300 MG/ML  SOLN
100.0000 mL | Freq: Once | INTRAMUSCULAR | Status: AC | PRN
  Administered 2022-01-27: 75 mL via INTRAVENOUS

## 2022-01-28 ENCOUNTER — Other Ambulatory Visit (HOSPITAL_COMMUNITY): Payer: Self-pay

## 2022-01-31 ENCOUNTER — Other Ambulatory Visit: Payer: Self-pay

## 2022-01-31 ENCOUNTER — Other Ambulatory Visit (HOSPITAL_COMMUNITY): Payer: Self-pay

## 2022-01-31 ENCOUNTER — Encounter: Payer: Self-pay | Admitting: Medical Oncology

## 2022-01-31 ENCOUNTER — Inpatient Hospital Stay (HOSPITAL_BASED_OUTPATIENT_CLINIC_OR_DEPARTMENT_OTHER): Payer: 59 | Admitting: Internal Medicine

## 2022-01-31 VITALS — BP 148/77 | HR 78 | Temp 98.1°F | Resp 17 | Wt 84.2 lb

## 2022-01-31 DIAGNOSIS — C3431 Malignant neoplasm of lower lobe, right bronchus or lung: Secondary | ICD-10-CM

## 2022-01-31 MED ORDER — OSIMERTINIB MESYLATE 80 MG PO TABS
80.0000 mg | ORAL_TABLET | Freq: Every day | ORAL | 3 refills | Status: DC
  Filled 2022-01-31: qty 30, 30d supply, fill #0
  Filled 2022-02-21: qty 30, 30d supply, fill #1
  Filled 2022-03-22: qty 30, 30d supply, fill #2
  Filled 2022-04-19: qty 30, 30d supply, fill #3

## 2022-01-31 NOTE — Progress Notes (Signed)
[facility] [phone]   [phone]  OFFICE PROGRESS NOTE  [name], MD [address] [place] [address]  DIAGNOSIS: Stage IV (T3, N2, M1 C) non-small cell lung cancer, adenocarcinoma presented with large right lower lobe lung mass in addition to subcarinal lymphadenopathy as well as bone metastasis diagnosed in April 2023  Molecular studies by foundation 1 showed EGFR T790M, Exon 19 deletion ([id number]) AKT2- Aplification-equivocal ARID1A- [id number]*1 RICTOR- amplification-equivocal  PDL1 Expression 50%  PRIOR THERAPY: Palliative radiotherapy to the metastatic T3 bone lesion under the care of Dr. Mitzi Hansen  CURRENT THERAPY: Tagrisso 80 mg p.o. daily.  First dose started May 22, 2021.   INTERVAL HISTORY: Anna Finley 77 y.o. female returns to the clinic today for follow-up visit accompanied by her daughter Aviannah Castoro.  The patient is feeling fine today with no concerning complaints except for the intermittent headache.  She denied having any chest pain, shortness of breath, cough or hemoptysis.  She has no nausea, vomiting, diarrhea or constipation.  She has no headache or visual changes.  She denied having any recent weight loss or night sweats.  She has no fever or chills.  She has been off of her treatment for few weeks now because of noncompliance.  She is here today for evaluation with repeat CT scan of the head, chest, abdomen and pelvis for restaging of her disease.  MEDICAL HISTORY: Past Medical History:  Diagnosis Date   COPD (chronic obstructive pulmonary disease) with chronic bronchitis (HCC)    HTN (hypertension)    Pneumothorax     ALLERGIES:  has No Known Allergies.  MEDICATIONS:  Current Outpatient Medications  Medication Sig Dispense Refill   acetaminophen (TYLENOL) 500 MG tablet Take 500 mg by mouth every 6 (six) hours as needed for moderate pain or headache.     apixaban (ELIQUIS) 5 MG  TABS tablet Take 1 tablet (5 mg total) by mouth 2 (two) times daily. Follow up with Dr. Arbutus Ped for additional refills 60 tablet 1   atenolol (TENORMIN) 50 MG tablet Take 1 tablet (50 mg total) by mouth daily. 30 tablet 0   atenolol-chlorthalidone (TENORETIC) 50-25 MG tablet Take 1 tablet by mouth daily.     atorvastatin (LIPITOR) 40 MG tablet Take 40 mg by mouth daily.     dexamethasone (DECADRON) 4 MG tablet See Taper instruction sheet 30 tablet 1   gabapentin (NEURONTIN) 300 MG capsule Take 300 mg by mouth 2 (two) times daily.     metFORMIN (GLUCOPHAGE) 500 MG tablet Take 500 mg by mouth daily.     mirtazapine (REMERON) 15 MG tablet Take 1 tablet (15 mg total) by mouth at bedtime. 30 tablet 2   montelukast (SINGULAIR) 10 MG tablet Take 10 mg by mouth at bedtime.     osimertinib mesylate (TAGRISSO) 80 MG tablet Take 1 tablet (80 mg total) by mouth daily. 30 tablet 3   polyethylene glycol (MIRALAX / GLYCOLAX) 17 g packet Take 17 g by mouth daily as needed for mild constipation. Meds to bed, thanks 14 each 0   potassium chloride SA (KLOR-CON M) 20 MEQ tablet Take 1 tablet (20 mEq total) by mouth 2 (two) times daily. 14 tablet 0   promethazine-dextromethorphan (PROMETHAZINE-DM) 6.25-15 MG/5ML syrup Take 5 mLs by mouth 4 (four) times daily as needed for cough.     WIXELA INHUB 250-50 MCG/ACT AEPB 1 puff 2 (two) times daily.     No current facility-administered  medications for this visit.    SURGICAL HISTORY:  Past Surgical History:  Procedure Laterality Date   BRONCHIAL NEEDLE ASPIRATION BIOPSY  04/20/2021   Procedure: BRONCHIAL NEEDLE ASPIRATION BIOPSIES;  Surgeon: Lorin Glass, MD;  Location: [facility];  Service: Endoscopy;;   BRONCHIAL WASHINGS  04/20/2021   Procedure: BRONCHIAL WASHINGS;  Surgeon: Lorin Glass, MD;  Location: [facility];  Service: Endoscopy;;   ENDOBRONCHIAL ULTRASOUND N/A 04/20/2021   Procedure: ENDOBRONCHIAL ULTRASOUND;  Surgeon: Lorin Glass, MD;  Location: [facility]  ENDOSCOPY;  Service: Endoscopy;  Laterality: N/A;   HIP ARTHROPLASTY     VIDEO BRONCHOSCOPY Left 04/20/2021   Procedure: VIDEO BRONCHOSCOPY WITH FLUORO;  Surgeon: Lorin Glass, MD;  Location: [facility];  Service: Endoscopy;  Laterality: Left;  EBUS    REVIEW OF SYSTEMS:  Constitutional: positive for fatigue Eyes: negative Ears, nose, mouth, throat, and face: negative Respiratory: negative Cardiovascular: negative Gastrointestinal: negative Genitourinary:negative Integument/breast: negative Hematologic/lymphatic: negative Musculoskeletal:positive for muscle weakness Neurological: positive for headaches Behavioral/Psych: negative Endocrine: negative Allergic/Immunologic: negative   PHYSICAL EXAMINATION: General appearance: alert, cooperative, appears older than stated age, cachectic, and fatigued Head: Normocephalic, without obvious abnormality, atraumatic Neck: no adenopathy, no JVD, supple, symmetrical, trachea midline, and thyroid not enlarged, symmetric, no tenderness/mass/nodules Lymph nodes: Cervical, supraclavicular, and axillary nodes normal. Resp: clear to auscultation bilaterally Back: symmetric, no curvature. ROM normal. No CVA tenderness. Cardio: regular rate and rhythm, S1, S2 normal, no murmur, click, rub or gallop GI: soft, non-tender; bowel sounds normal; no masses,  no organomegaly Extremities: extremities normal, atraumatic, no cyanosis or edema Neurologic: Alert and oriented X 3, normal strength and tone. Normal symmetric reflexes. Normal coordination and gait  ECOG PERFORMANCE STATUS: 1 - Symptomatic but completely ambulatory  Blood pressure (!) 148/77, pulse 78, temperature 98.1 F (36.7 C), temperature source Oral, resp. rate 17, weight 84 lb 3 oz (38.2 kg), SpO2 100 %.  LABORATORY DATA: Lab Results  Component Value Date   WBC 5.4 01/19/2022   HGB 10.2 (L) 01/19/2022   HCT 32.2 (L) 01/19/2022   MCV 89.9 01/19/2022   PLT 348 01/19/2022       Chemistry      Component Value Date/Time   NA 138 01/19/2022 1530   K 3.4 (L) 01/19/2022 1530   CL 102 01/19/2022 1530   CO2 26 01/19/2022 1530   BUN 11 01/19/2022 1530   CREATININE 0.74 01/19/2022 1530      Component Value Date/Time   CALCIUM 8.9 01/19/2022 1530   ALKPHOS 107 01/19/2022 1530   AST 19 01/19/2022 1530   ALT 13 01/19/2022 1530   BILITOT 0.6 01/19/2022 1530       RADIOGRAPHIC STUDIES: CT HEAD W & WO CONTRAST ( )  Result Date: 01/28/2022 CLINICAL DATA:  Malignant neoplasm of lower lobe of right lung (HCC) C34.31 (ICD-10-CM). EXAM: CT HEAD WITHOUT AND WITH CONTRAST TECHNIQUE: Contiguous axial images were obtained from the base of the skull through the vertex without and with intravenous contrast. RADIATION DOSE REDUCTION: This exam was performed according to the departmental dose-optimization program which includes automated exposure control, adjustment of the mA and/or kV according to patient size and/or use of iterative reconstruction technique. CONTRAST:  49mL OMNIPAQUE IOHEXOL 300 MG/ML  SOLN COMPARISON:  MRI of the brain April 19, 2021; head CT April 18, 2021. FINDINGS: Brain: No evidence of acute infarction, hemorrhage, hydrocephalus, extra-axial collection or mass lesion/mass effect. No focus of abnormal contrast enhancement identified. Confluent hypodensity within the white matter of the  cerebral hemispheres, nonspecific, most likely related to chronic microangiopathy, unchanged. Mild parenchymal volume loss, predominantly central. Vascular: Calcified plaques in the bilateral carotid siphons. Skull: Normal. Negative for fracture or focal lesion. Sinuses/Orbits: Bubbly secretion within the bilateral sphenoid sinuses. Visualized orbits are unremarkable. Other: None. IMPRESSION: 1. No acute intracranial abnormality. No focus of abnormal contrast enhancement identified. 2. Bubbly secretion within the bilateral sphenoid sinuses, which can be seen in the setting of acute  sinusitis. Electronically Signed   By: Baldemar Lenis M.D.   On: 01/28/2022 10:49   CT Chest W Contrast  Result Date: 01/28/2022 CLINICAL DATA:  Non-small-cell lung cancer on chemotherapy. Assess treatment response. * Tracking Code: BO * EXAM: CT CHEST, ABDOMEN, AND PELVIS WITH CONTRAST TECHNIQUE: Multidetector CT imaging of the chest, abdomen and pelvis was performed following the standard protocol during bolus administration of intravenous contrast. RADIATION DOSE REDUCTION: This exam was performed according to the departmental dose-optimization program which includes automated exposure control, adjustment of the mA and/or kV according to patient size and/or use of iterative reconstruction technique. CONTRAST:  35mL OMNIPAQUE IOHEXOL 300 MG/ML  SOLN COMPARISON:  Chest CT 08/03/2021. Abdomen pelvis CT 05/06/2021. Older exams as well FINDINGS: CT CHEST FINDINGS Cardiovascular: Heart is nonenlarged. Small pericardial effusion. This is new from prior. Normal caliber thoracic aorta with some mild atherosclerotic calcified plaque. Mediastinum/Nodes: No specific abnormal lymph node enlargement present in the axillary region, left hilum, right hilum or mediastinum. Previously there is a subcarinal node which was measured at 9 mm in short axis. Today this same node on series 504, image 25 measures 9 mm in short axis. There is some edema in the mediastinum. Small to moderate hiatal hernia with a normal caliber thoracic esophagus more proximally. Small thyroid gland. Lungs/Pleura: Tiny right pleural effusion identified. Underlying centrilobular emphysematous changes. No left-sided effusion. No pneumothorax. Left lung is without consolidation, pneumothorax. Tiny 3 mm nodule seen posterior left lower lobe on series 505, image 102. These are ground-glass type nodules. Not clearly seen previously but low suspicion and recommend attention on follow-up. The right lung again demonstrates a right lower lobe lung  mass. Previously this measured 2.6 x 2.3 cm and today on series 505, image 70 2.1 by 1.8 cm. Adjacent bandlike opacities are again identified. The adjacent bronchi again show significant debris and wall thickening. There is bandlike opacities as well loss in the middle lobe. Overall the amount of opacity in the dependent portion of the middle lobe is increasing. Those more medial are similar. There is also thickening identified along the medial aspect of the right upper lobe with some scarring and bronchiectasis. Musculoskeletal: There is progressive healing of the left second rib fracture. There is some streak artifact as the patient's arms were scanned at the patient's side. Advanced degenerative changes of the shoulders. There is diffuse sclerosis involving vertebral body at T3 with some height loss. There is also sclerotic lesions at other levels of the thoracic spine consistent with osseous metastatic disease. Overall distribution appears similar but if needed a whole-body bone scan can be performed. CT ABDOMEN PELVIS FINDINGS Hepatobiliary: Fatty liver infiltration. No space-occupying liver lesion. Tiny dome benign-appearing cysts in the right hepatic lobe is unchanged from previous. Similar focus in segment 6. Patent portal vein. Gallbladder is nondilated. Slight ectasia of the biliary tree but normal tapering towards the pancreatic head. Pancreas: Mild pancreatic atrophy without obvious mass lesion. There is minimal ectasia of the pancreatic duct towards the head, unchanged. Spleen: Spleen is nonenlarged.  Preserved homogeneous appearance. Adrenals/Urinary Tract: Left adrenal gland is preserved. Once again there is small right adrenal nodule which previously measured 17 x 7 mm and today 17 by 7 mm on series 504, image 48, unchanged when adjusting for technique. Moderate atrophy seen to the right kidney. Mild left. No enhancing renal lesion. Some focal areas of cortical scarring are identified. No collecting  system dilatation. Preserved contours of the urinary bladder. Stomach/Bowel: On this non oral contrast exam, the large bowel has a normal course and caliber with scattered stool. Normal retrocecal appendix. Only slightly redundant course seen to the transverse colon. The stomach is underdistended. Small bowel is nondilated. Vascular/Lymphatic: Normal caliber aorta and IVC with some atherosclerotic changes. No specific abnormal lymph node enlargement identified in the abdomen and pelvis. Reproductive: Multiple calcified uterine fibroids are identified. No obvious adnexal mass. Other: No abdominal wall hernia or abnormality. No abdominopelvic ascites. Anasarca. Musculoskeletal: Streak artifact from left hip arthroplasty. There is patchy sclerosis involving the sacrum as well as areas along the iliac bones and elsewhere along the pelvis and lumbar spine. Changes are worrisome for osseous metastatic disease. Distribution appears relatively similar. However if needed a whole-body bone scan can be performed as clinically indicated. IMPRESSION: Decreasing size right lower lobe lung mass. Stable adrenal lesion on the right as well as scattered sclerotic bone metastases. Persistent right-sided pleural effusion. Slightly increasing opacity in the posterior aspect of the right middle lobe other areas of opacity in the right lung are similar. Small pericardial effusion, increased from previous.  Anasarca. No new mass lesion, fluid collection or lymph node enlargement otherwise. Hiatal hernia. Electronically Signed   By: Karen Kays M.D.   On: 01/28/2022 10:12   CT Abdomen Pelvis W Contrast  Result Date: 01/28/2022 CLINICAL DATA:  Non-small-cell lung cancer on chemotherapy. Assess treatment response. * Tracking Code: BO * EXAM: CT CHEST, ABDOMEN, AND PELVIS WITH CONTRAST TECHNIQUE: Multidetector CT imaging of the chest, abdomen and pelvis was performed following the standard protocol during bolus administration of  intravenous contrast. RADIATION DOSE REDUCTION: This exam was performed according to the departmental dose-optimization program which includes automated exposure control, adjustment of the mA and/or kV according to patient size and/or use of iterative reconstruction technique. CONTRAST:  21mL OMNIPAQUE IOHEXOL 300 MG/ML  SOLN COMPARISON:  Chest CT 08/03/2021. Abdomen pelvis CT 05/06/2021. Older exams as well FINDINGS: CT CHEST FINDINGS Cardiovascular: Heart is nonenlarged. Small pericardial effusion. This is new from prior. Normal caliber thoracic aorta with some mild atherosclerotic calcified plaque. Mediastinum/Nodes: No specific abnormal lymph node enlargement present in the axillary region, left hilum, right hilum or mediastinum. Previously there is a subcarinal node which was measured at 9 mm in short axis. Today this same node on series 504, image 25 measures 9 mm in short axis. There is some edema in the mediastinum. Small to moderate hiatal hernia with a normal caliber thoracic esophagus more proximally. Small thyroid gland. Lungs/Pleura: Tiny right pleural effusion identified. Underlying centrilobular emphysematous changes. No left-sided effusion. No pneumothorax. Left lung is without consolidation, pneumothorax. Tiny 3 mm nodule seen posterior left lower lobe on series 505, image 102. These are ground-glass type nodules. Not clearly seen previously but low suspicion and recommend attention on follow-up. The right lung again demonstrates a right lower lobe lung mass. Previously this measured 2.6 x 2.3 cm and today on series 505, image 70 2.1 by 1.8 cm. Adjacent bandlike opacities are again identified. The adjacent bronchi again show significant debris and wall thickening.  There is bandlike opacities as well loss in the middle lobe. Overall the amount of opacity in the dependent portion of the middle lobe is increasing. Those more medial are similar. There is also thickening identified along the medial  aspect of the right upper lobe with some scarring and bronchiectasis. Musculoskeletal: There is progressive healing of the left second rib fracture. There is some streak artifact as the patient's arms were scanned at the patient's side. Advanced degenerative changes of the shoulders. There is diffuse sclerosis involving vertebral body at T3 with some height loss. There is also sclerotic lesions at other levels of the thoracic spine consistent with osseous metastatic disease. Overall distribution appears similar but if needed a whole-body bone scan can be performed. CT ABDOMEN PELVIS FINDINGS Hepatobiliary: Fatty liver infiltration. No space-occupying liver lesion. Tiny dome benign-appearing cysts in the right hepatic lobe is unchanged from previous. Similar focus in segment 6. Patent portal vein. Gallbladder is nondilated. Slight ectasia of the biliary tree but normal tapering towards the pancreatic head. Pancreas: Mild pancreatic atrophy without obvious mass lesion. There is minimal ectasia of the pancreatic duct towards the head, unchanged. Spleen: Spleen is nonenlarged.  Preserved homogeneous appearance. Adrenals/Urinary Tract: Left adrenal gland is preserved. Once again there is small right adrenal nodule which previously measured 17 x 7 mm and today 17 by 7 mm on series 504, image 48, unchanged when adjusting for technique. Moderate atrophy seen to the right kidney. Mild left. No enhancing renal lesion. Some focal areas of cortical scarring are identified. No collecting system dilatation. Preserved contours of the urinary bladder. Stomach/Bowel: On this non oral contrast exam, the large bowel has a normal course and caliber with scattered stool. Normal retrocecal appendix. Only slightly redundant course seen to the transverse colon. The stomach is underdistended. Small bowel is nondilated. Vascular/Lymphatic: Normal caliber aorta and IVC with some atherosclerotic changes. No specific abnormal lymph node  enlargement identified in the abdomen and pelvis. Reproductive: Multiple calcified uterine fibroids are identified. No obvious adnexal mass. Other: No abdominal wall hernia or abnormality. No abdominopelvic ascites. Anasarca. Musculoskeletal: Streak artifact from left hip arthroplasty. There is patchy sclerosis involving the sacrum as well as areas along the iliac bones and elsewhere along the pelvis and lumbar spine. Changes are worrisome for osseous metastatic disease. Distribution appears relatively similar. However if needed a whole-body bone scan can be performed as clinically indicated. IMPRESSION: Decreasing size right lower lobe lung mass. Stable adrenal lesion on the right as well as scattered sclerotic bone metastases. Persistent right-sided pleural effusion. Slightly increasing opacity in the posterior aspect of the right middle lobe other areas of opacity in the right lung are similar. Small pericardial effusion, increased from previous.  Anasarca. No new mass lesion, fluid collection or lymph node enlargement otherwise. Hiatal hernia. Electronically Signed   By: Karen Kays M.D.   On: 01/28/2022 10:12    ASSESSMENT AND PLAN: This is a 77 years old African-American female diagnosed with Stage IV (T3, N2, M1 C) non-small cell lung cancer, adenocarcinoma presented with large right lower lobe lung mass in addition to subcarinal lymphadenopathy as well as right adrenal gland and bone metastasis diagnosed in April 2023.   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   PD-L1 expression of 50%. The patient started her treatment with Tagrisso in May 2023 and has been tolerating this treatment well.  She has been very noncompliant with her appointments for the follow-up visit, lab  and scan.   The patient finally made it back to the clinic and she had repeat CT scan of the head, chest, abdomen and pelvis performed recently.  I personally and independently  reviewed the scan and discussed the result with the patient and her daughter today. Her scan showed no concerning findings for disease progression and she continues to have decrease in the size of the right lower lobe lung mass. I recommended for her to continue on her treatment with Tagrisso and I will send her prescription for refill today. I will see her back for follow-up visit in 6 weeks for evaluation with repeat blood work. She was advised to call immediately if she has any other concerning symptoms in the interval. The patient was advised to call immediately if she has any other concerning symptoms in the interval.  All questions were answered. The patient knows to call the clinic with any problems, questions or concerns. We can certainly see the patient much sooner if necessary. The total time spent in the appointment was 30 minutes.  Disclaimer: This note was dictated with voice recognition software. Similar sounding words can inadvertently be transcribed and may not be corrected upon review.

## 2022-02-01 ENCOUNTER — Other Ambulatory Visit: Payer: Self-pay

## 2022-02-02 ENCOUNTER — Other Ambulatory Visit (HOSPITAL_COMMUNITY): Payer: Self-pay

## 2022-02-15 ENCOUNTER — Telehealth: Payer: Self-pay | Admitting: Internal Medicine

## 2022-02-15 NOTE — Telephone Encounter (Signed)
Called patient regarding February appointment, patient is notified.

## 2022-02-17 ENCOUNTER — Encounter: Payer: Self-pay | Admitting: Internal Medicine

## 2022-02-18 ENCOUNTER — Other Ambulatory Visit (HOSPITAL_COMMUNITY): Payer: Self-pay

## 2022-02-21 ENCOUNTER — Other Ambulatory Visit (HOSPITAL_COMMUNITY): Payer: Self-pay

## 2022-02-21 ENCOUNTER — Other Ambulatory Visit: Payer: Self-pay

## 2022-02-25 ENCOUNTER — Other Ambulatory Visit (HOSPITAL_COMMUNITY): Payer: Self-pay

## 2022-03-09 ENCOUNTER — Other Ambulatory Visit: Payer: Self-pay

## 2022-03-15 ENCOUNTER — Telehealth: Payer: Self-pay | Admitting: Internal Medicine

## 2022-03-15 ENCOUNTER — Inpatient Hospital Stay: Payer: 59

## 2022-03-15 ENCOUNTER — Inpatient Hospital Stay: Payer: 59 | Admitting: Internal Medicine

## 2022-03-15 ENCOUNTER — Encounter: Payer: Self-pay | Admitting: Internal Medicine

## 2022-03-15 NOTE — Telephone Encounter (Signed)
Rescheduled 02/27 appointment due to patient's daughter request. Patient will be notified of new appointment time.

## 2022-03-16 ENCOUNTER — Ambulatory Visit: Payer: 59 | Admitting: Internal Medicine

## 2022-03-16 ENCOUNTER — Other Ambulatory Visit: Payer: Self-pay

## 2022-03-16 ENCOUNTER — Other Ambulatory Visit: Payer: 59

## 2022-03-21 ENCOUNTER — Encounter: Payer: Self-pay | Admitting: Medical Oncology

## 2022-03-21 ENCOUNTER — Inpatient Hospital Stay (HOSPITAL_BASED_OUTPATIENT_CLINIC_OR_DEPARTMENT_OTHER): Payer: 59 | Admitting: Internal Medicine

## 2022-03-21 ENCOUNTER — Inpatient Hospital Stay: Payer: 59 | Attending: Physician Assistant

## 2022-03-21 VITALS — BP 141/72 | HR 110 | Temp 99.3°F | Resp 16 | Wt 86.2 lb

## 2022-03-21 DIAGNOSIS — C3431 Malignant neoplasm of lower lobe, right bronchus or lung: Secondary | ICD-10-CM | POA: Insufficient documentation

## 2022-03-21 DIAGNOSIS — I8290 Acute embolism and thrombosis of unspecified vein: Secondary | ICD-10-CM | POA: Diagnosis not present

## 2022-03-21 DIAGNOSIS — D649 Anemia, unspecified: Secondary | ICD-10-CM | POA: Diagnosis not present

## 2022-03-21 DIAGNOSIS — R590 Localized enlarged lymph nodes: Secondary | ICD-10-CM | POA: Insufficient documentation

## 2022-03-21 DIAGNOSIS — I10 Essential (primary) hypertension: Secondary | ICD-10-CM | POA: Diagnosis not present

## 2022-03-21 DIAGNOSIS — C7951 Secondary malignant neoplasm of bone: Secondary | ICD-10-CM | POA: Insufficient documentation

## 2022-03-21 DIAGNOSIS — C3490 Malignant neoplasm of unspecified part of unspecified bronchus or lung: Secondary | ICD-10-CM

## 2022-03-21 LAB — CMP (CANCER CENTER ONLY)
ALT: 10 U/L (ref 0–44)
AST: 28 U/L (ref 15–41)
Albumin: 3.7 g/dL (ref 3.5–5.0)
Alkaline Phosphatase: 121 U/L (ref 38–126)
Anion gap: 9 (ref 5–15)
BUN: 13 mg/dL (ref 8–23)
CO2: 29 mmol/L (ref 22–32)
Calcium: 8.8 mg/dL — ABNORMAL LOW (ref 8.9–10.3)
Chloride: 95 mmol/L — ABNORMAL LOW (ref 98–111)
Creatinine: 0.81 mg/dL (ref 0.44–1.00)
GFR, Estimated: >60 mL/min (ref >60)
Glucose, Bld: 114 mg/dL — ABNORMAL HIGH (ref 70–99)
Potassium: 3 mmol/L — ABNORMAL LOW (ref 3.5–5.1)
Sodium: 133 mmol/L — ABNORMAL LOW (ref 135–145)
Total Bilirubin: 0.5 mg/dL (ref 0.3–1.2)
Total Protein: 6.8 g/dL (ref 6.5–8.1)

## 2022-03-21 LAB — CBC WITH DIFFERENTIAL (CANCER CENTER ONLY)
Abs Immature Granulocytes: 0.01 10*3/uL (ref 0.00–0.07)
Basophils Absolute: 0 10*3/uL (ref 0.0–0.1)
Basophils Relative: 0 %
Eosinophils Absolute: 0 10*3/uL (ref 0.0–0.5)
Eosinophils Relative: 0 %
HCT: 34.9 % — ABNORMAL LOW (ref 36.0–46.0)
Hemoglobin: 11.5 g/dL — ABNORMAL LOW (ref 12.0–15.0)
Immature Granulocytes: 0 %
Lymphocytes Relative: 19 %
Lymphs Abs: 0.9 10*3/uL (ref 0.7–4.0)
MCH: 28.6 pg (ref 26.0–34.0)
MCHC: 33 g/dL (ref 30.0–36.0)
MCV: 86.8 fL (ref 80.0–100.0)
Monocytes Absolute: 0.5 10*3/uL (ref 0.1–1.0)
Monocytes Relative: 12 %
Neutro Abs: 3.1 10*3/uL (ref 1.7–7.7)
Neutrophils Relative %: 69 %
Platelet Count: 255 10*3/uL (ref 150–400)
RBC: 4.02 MIL/uL (ref 3.87–5.11)
RDW: 14.7 % (ref 11.5–15.5)
WBC Count: 4.5 10*3/uL (ref 4.0–10.5)
nRBC: 0 % (ref 0.0–0.2)

## 2022-03-21 LAB — TSH: TSH: 1.122 u[IU]/mL (ref 0.350–4.500)

## 2022-03-21 MED ORDER — APIXABAN 5 MG PO TABS: 5.0000 mg | ORAL_TABLET | Freq: Two times a day (BID) | ORAL | 1 refills | Status: DC

## 2022-03-21 MED ORDER — POTASSIUM CHLORIDE CRYS ER 20 MEQ PO TBCR: 20.0000 meq | EXTENDED_RELEASE_TABLET | Freq: Every day | ORAL | 0 refills | Status: DC

## 2022-03-21 NOTE — Progress Notes (Signed)
Anna Finley Telephone:(336) [phone]   Fax:(336) [phone]  OFFICE PROGRESS NOTE  Anna Mccreedy, MD [id number] [place] [address]  DIAGNOSIS: Stage IV (T3, N2, M1 C) non-small cell lung cancer, adenocarcinoma presented with large right lower lobe lung mass in addition to subcarinal lymphadenopathy as well as bone metastasis diagnosed in April 2023  Molecular studies by foundation 1 showed EGFR T790M, Exon 19 deletion [id number] AKT2- Aplification-equivocal ARID1A- [id number]*1 RICTOR- amplification-equivocal  PDL1 Expression 50%  PRIOR THERAPY: Palliative radiotherapy to the metastatic T3 bone lesion under the care of Dr. MLisbeth Renshaw CURRENT THERAPY: Tagrisso 80 mg p.o. daily.  First dose started May 22, 2021.  Status post 10 months of treatment.   INTERVAL HISTORY: Anna Seccombe77y.o. female returns to the clinic today for follow-up visit accompanied by her daughter.  The patient is feeling fine today with no concerning complaints except for the weakness in her lower extremities.  She has been tolerating her treatment with Tagrisso fairly well.  She denied having any nausea, vomiting, diarrhea or constipation.  She has no skin rash or itching.  She has no chest pain, shortness of breath, cough or hemoptysis.  She has no significant weight loss or night sweats.  She continues to have intermittent headache.  She is here today for evaluation and repeat blood work.  MEDICAL HISTORY: Past Medical History:  Diagnosis Date   COPD (chronic obstructive pulmonary disease) with chronic bronchitis (HCC)    HTN (hypertension)    Pneumothorax     ALLERGIES:  has No Known Allergies.  MEDICATIONS:  Current Outpatient Medications  Medication Sig Dispense Refill   acetaminophen (TYLENOL) 500 MG tablet Take 500 mg by mouth every 6 (six) hours as needed for moderate pain or headache.     apixaban (ELIQUIS) 5 MG TABS tablet Take 1 tablet (5 mg total) by  mouth 2 (two) times daily. Follow up with Dr. MJulien Nordmannfor additional refills 60 tablet 1   atenolol (TENORMIN) 50 MG tablet Take 1 tablet (50 mg total) by mouth daily. 30 tablet 0   atenolol-chlorthalidone (TENORETIC) 50-25 MG tablet Take 1 tablet by mouth daily.     atorvastatin (LIPITOR) 40 MG tablet Take 40 mg by mouth daily.     dexamethasone (DECADRON) 4 MG tablet See Taper instruction sheet 30 tablet 1   gabapentin (NEURONTIN) 300 MG capsule Take 300 mg by mouth 2 (two) times daily.     metFORMIN (GLUCOPHAGE) 500 MG tablet Take 500 mg by mouth daily.     mirtazapine (REMERON) 15 MG tablet Take 1 tablet (15 mg total) by mouth at bedtime. 30 tablet 2   montelukast (SINGULAIR) 10 MG tablet Take 10 mg by mouth at bedtime.     osimertinib mesylate (TAGRISSO) 80 MG tablet Take 1 tablet (80 mg total) by mouth daily. 30 tablet 3   polyethylene glycol (MIRALAX / GLYCOLAX) 17 g packet Take 17 g by mouth daily as needed for mild constipation. Meds to bed, thanks 14 each 0   potassium chloride SA (KLOR-CON M) 20 MEQ tablet Take 1 tablet (20 mEq total) by mouth 2 (two) times daily. 14 tablet 0   promethazine-dextromethorphan (PROMETHAZINE-DM) 6.25-15 MG/5ML syrup Take 5 mLs by mouth 4 (four) times daily as needed for cough.     WIXELA INHUB 250-50 MCG/ACT AEPB 1 puff 2 (two) times daily.     No current facility-administered medications for this visit.    SURGICAL HISTORY:  Past Surgical History:  Procedure Laterality Date   BRONCHIAL NEEDLE ASPIRATION BIOPSY  04/20/2021   Procedure: BRONCHIAL NEEDLE ASPIRATION BIOPSIES;  Surgeon: Candee Furbish, MD;  Location: [facility];  Service: Endoscopy;;   BRONCHIAL WASHINGS  04/20/2021   Procedure: BRONCHIAL WASHINGS;  Surgeon: Candee Furbish, MD;  Location: [facility];  Service: Endoscopy;;   ENDOBRONCHIAL ULTRASOUND N/A 04/20/2021   Procedure: ENDOBRONCHIAL ULTRASOUND;  Surgeon: Candee Furbish, MD;  Location: [facility];  Service: Endoscopy;   Laterality: N/A;   HIP ARTHROPLASTY     VIDEO BRONCHOSCOPY Left 04/20/2021   Procedure: VIDEO BRONCHOSCOPY WITH FLUORO;  Surgeon: Candee Furbish, MD;  Location: [facility];  Service: Endoscopy;  Laterality: Left;  EBUS    REVIEW OF SYSTEMS:  A comprehensive review of systems was negative except for: Constitutional: positive for fatigue Musculoskeletal: positive for muscle weakness   PHYSICAL EXAMINATION: General appearance: alert, cooperative, appears older than stated age, cachectic, and fatigued Head: Normocephalic, without obvious abnormality, atraumatic Neck: no adenopathy, no JVD, supple, symmetrical, trachea midline, and thyroid not enlarged, symmetric, no tenderness/mass/nodules Lymph nodes: Cervical, supraclavicular, and axillary nodes normal. Resp: clear to auscultation bilaterally Back: symmetric, no curvature. ROM normal. No CVA tenderness. Cardio: regular rate and rhythm, S1, S2 normal, no murmur, click, rub or gallop GI: soft, non-tender; bowel sounds normal; no masses,  no organomegaly Extremities: extremities normal, atraumatic, no cyanosis or edema  ECOG PERFORMANCE STATUS: 1 - Symptomatic but completely ambulatory  Blood pressure (!) 141/72, pulse (!) 110, temperature 99.3 F (37.4 C), temperature source Oral, resp. rate 16, weight 86 lb 3.2 oz (39.1 kg), SpO2 100 %.  LABORATORY DATA: Lab Results  Component Value Date   WBC 4.5 03/21/2022   HGB 11.5 (L) 03/21/2022   HCT 34.9 (L) 03/21/2022   MCV 86.8 03/21/2022   PLT 255 03/21/2022      Chemistry      Component Value Date/Time   NA 138 01/19/2022 1530   K 3.4 (L) 01/19/2022 1530   CL 102 01/19/2022 1530   CO2 26 01/19/2022 1530   BUN 11 01/19/2022 1530   CREATININE 0.74 01/19/2022 1530      Component Value Date/Time   CALCIUM 8.9 01/19/2022 1530   ALKPHOS 107 01/19/2022 1530   AST 19 01/19/2022 1530   ALT 13 01/19/2022 1530   BILITOT 0.6 01/19/2022 1530       RADIOGRAPHIC STUDIES: No results  found.  ASSESSMENT AND PLAN: This is a 77 years old African-American female diagnosed with Stage IV (T3, N2, M1 C) non-small cell lung cancer, adenocarcinoma presented with large right lower lobe lung mass in addition to subcarinal lymphadenopathy as well as right adrenal gland and bone metastasis diagnosed in April 2023.   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   PD-L1 expression of 50%. The patient started her treatment with Tagrisso in May 2023.  She is status post 10 months of treatment and has been tolerating her treatment with Tagrisso fairly well. Her CBC today is unremarkable except for mild anemia. I recommended for the patient to continue her current treatment with Tagrisso with the same dose. I will see her back for follow-up visit in 6 weeks for evaluation with repeat CT scan of the head, chest, abdomen and pelvis for restaging of her disease. The patient was advised to call immediately if she has any other concerning symptoms in the interval.  All questions were answered. The patient knows  to call the clinic with any problems, questions or concerns. We can certainly see the patient much sooner if necessary. The total time spent in the appointment was 20 minutes.  Disclaimer: This note was dictated with voice recognition software. Similar sounding words can inadvertently be transcribed and may not be corrected upon review.

## 2022-03-22 ENCOUNTER — Inpatient Hospital Stay: Payer: 59

## 2022-03-22 ENCOUNTER — Other Ambulatory Visit (HOSPITAL_COMMUNITY): Payer: Self-pay

## 2022-03-22 ENCOUNTER — Inpatient Hospital Stay: Payer: 59 | Admitting: Internal Medicine

## 2022-03-23 ENCOUNTER — Other Ambulatory Visit: Payer: Self-pay

## 2022-03-24 ENCOUNTER — Other Ambulatory Visit (HOSPITAL_COMMUNITY): Payer: Self-pay

## 2022-04-08 ENCOUNTER — Encounter: Payer: Self-pay | Admitting: Internal Medicine

## 2022-04-19 ENCOUNTER — Other Ambulatory Visit (HOSPITAL_COMMUNITY): Payer: Self-pay

## 2022-04-20 ENCOUNTER — Other Ambulatory Visit: Payer: Self-pay

## 2022-04-28 ENCOUNTER — Inpatient Hospital Stay: Payer: 59 | Attending: Physician Assistant

## 2022-04-28 ENCOUNTER — Ambulatory Visit (HOSPITAL_COMMUNITY): Admission: RE | Admit: 2022-04-28 | Payer: 59 | Source: Ambulatory Visit

## 2022-04-28 DIAGNOSIS — C3431 Malignant neoplasm of lower lobe, right bronchus or lung: Secondary | ICD-10-CM | POA: Insufficient documentation

## 2022-04-28 DIAGNOSIS — Z79899 Other long term (current) drug therapy: Secondary | ICD-10-CM | POA: Insufficient documentation

## 2022-04-28 DIAGNOSIS — C7951 Secondary malignant neoplasm of bone: Secondary | ICD-10-CM | POA: Insufficient documentation

## 2022-04-30 ENCOUNTER — Other Ambulatory Visit: Payer: Self-pay | Admitting: Internal Medicine

## 2022-04-30 ENCOUNTER — Ambulatory Visit (HOSPITAL_BASED_OUTPATIENT_CLINIC_OR_DEPARTMENT_OTHER)
Admission: RE | Admit: 2022-04-30 | Discharge: 2022-04-30 | Disposition: A | Discharge status: Home / Self Care | Payer: 59 | Source: Ambulatory Visit | Attending: Internal Medicine | Admitting: Internal Medicine

## 2022-04-30 DIAGNOSIS — C3490 Malignant neoplasm of unspecified part of unspecified bronchus or lung: Secondary | ICD-10-CM

## 2022-04-30 DIAGNOSIS — I3139 Other pericardial effusion (noninflammatory): Secondary | ICD-10-CM | POA: Diagnosis not present

## 2022-04-30 DIAGNOSIS — C7951 Secondary malignant neoplasm of bone: Secondary | ICD-10-CM | POA: Insufficient documentation

## 2022-04-30 DIAGNOSIS — J90 Pleural effusion, not elsewhere classified: Secondary | ICD-10-CM | POA: Insufficient documentation

## 2022-04-30 DIAGNOSIS — K449 Diaphragmatic hernia without obstruction or gangrene: Secondary | ICD-10-CM | POA: Insufficient documentation

## 2022-04-30 DIAGNOSIS — I8290 Acute embolism and thrombosis of unspecified vein: Secondary | ICD-10-CM

## 2022-04-30 MED ORDER — IOHEXOL 300 MG/ML  SOLN
80.0000 mL | Freq: Once | INTRAMUSCULAR | Status: AC | PRN
  Administered 2022-04-30: 50 mL via INTRAVENOUS

## 2022-05-04 ENCOUNTER — Other Ambulatory Visit: Payer: Self-pay

## 2022-05-04 ENCOUNTER — Inpatient Hospital Stay (HOSPITAL_BASED_OUTPATIENT_CLINIC_OR_DEPARTMENT_OTHER): Payer: 59 | Admitting: Internal Medicine

## 2022-05-04 VITALS — BP 115/71 | HR 80 | Temp 98.1°F | Resp 15 | Wt 85.9 lb

## 2022-05-04 DIAGNOSIS — C3431 Malignant neoplasm of lower lobe, right bronchus or lung: Secondary | ICD-10-CM | POA: Diagnosis present

## 2022-05-04 DIAGNOSIS — Z79899 Other long term (current) drug therapy: Secondary | ICD-10-CM | POA: Diagnosis not present

## 2022-05-04 DIAGNOSIS — C7951 Secondary malignant neoplasm of bone: Secondary | ICD-10-CM | POA: Diagnosis not present

## 2022-05-04 NOTE — Progress Notes (Signed)
[facility] [phone]   Fax:(336) [phone]  OFFICE PROGRESS NOTE  Anna Plum, Anna Finley [address] [place] [address]  DIAGNOSIS: Stage IV (T3, N2, M1 C) non-small cell lung cancer, adenocarcinoma presented with large right lower lobe lung mass in addition to subcarinal lymphadenopathy as well as bone metastasis diagnosed in April 2023  Molecular studies by foundation 1 showed EGFR T790M, Exon 19 deletion ([id number]) AKT2- Aplification-equivocal ARID1A- R1726fs*1 RICTOR- amplification-equivocal  PDL1 Expression 50%  PRIOR THERAPY: Palliative radiotherapy to the metastatic T3 bone lesion under the care of Dr. Mitzi Hansen  CURRENT THERAPY: Tagrisso 80 mg p.o. daily.  First dose started May 22, 2021.  Status post 11.5 months of treatment.   INTERVAL HISTORY: Anna Finley 77 y.o. female returns to the clinic today for follow-up visit accompanied by her granddaughter.  The patient is feeling fine today with no concerning complaints except for occasional pain in her right shoulder area.  She also has intermittent headache.  She denied having any current chest pain, shortness of breath, cough or hemoptysis.  She has no nausea, vomiting, diarrhea or constipation.  She has no fever or chills.  She lost few pounds since her last visit.  She is here today for evaluation with repeat CT scan of the head, chest, abdomen and pelvis.  She has been tolerating her treatment with Tagrisso fairly well. MEDICAL HISTORY: Past Medical History:  Diagnosis Date   COPD (chronic obstructive pulmonary disease) with chronic bronchitis (HCC)    HTN (hypertension)    Pneumothorax     ALLERGIES:  has No Known Allergies.  MEDICATIONS:  Current Outpatient Medications  Medication Sig Dispense Refill   acetaminophen (TYLENOL) 500 MG tablet Take 500 mg by mouth every 6 (six) hours as needed for moderate pain or headache.     apixaban (ELIQUIS) 5 MG TABS tablet  Take 1 tablet (5 mg total) by mouth 2 (two) times daily. Follow up with Dr. Arbutus Ped for additional refills 60 tablet 1   atenolol (TENORMIN) 50 MG tablet Take 1 tablet (50 mg total) by mouth daily. 30 tablet 0   atenolol-chlorthalidone (TENORETIC) 50-25 MG tablet Take 1 tablet by mouth daily.     atorvastatin (LIPITOR) 40 MG tablet Take 40 mg by mouth daily.     gabapentin (NEURONTIN) 300 MG capsule Take 300 mg by mouth 2 (two) times daily.     metFORMIN (GLUCOPHAGE) 500 MG tablet Take 500 mg by mouth daily.     montelukast (SINGULAIR) 10 MG tablet Take 10 mg by mouth at bedtime.     osimertinib mesylate (TAGRISSO) 80 MG tablet Take 1 tablet (80 mg total) by mouth daily. 30 tablet 3   polyethylene glycol (MIRALAX / GLYCOLAX) 17 g packet Take 17 g by mouth daily as needed for mild constipation. Meds to bed, thanks 14 each 0   potassium chloride SA (KLOR-CON M) 20 MEQ tablet Take 1 tablet (20 mEq total) by mouth daily. 7 tablet 0   promethazine-dextromethorphan (PROMETHAZINE-DM) 6.25-15 MG/5ML syrup Take 5 mLs by mouth 4 (four) times daily as needed for cough.     WIXELA INHUB 250-50 MCG/ACT AEPB 1 puff 2 (two) times daily.     No current facility-administered medications for this visit.    SURGICAL HISTORY:  Past Surgical History:  Procedure Laterality Date   BRONCHIAL NEEDLE ASPIRATION BIOPSY  04/20/2021   Procedure: BRONCHIAL NEEDLE ASPIRATION BIOPSIES;  Surgeon: Lorin Glass, Anna Finley;  Location: [facility];  Service: Endoscopy;;   BRONCHIAL WASHINGS  04/20/2021   Procedure: BRONCHIAL WASHINGS;  Surgeon: Lorin Glass, Anna Finley;  Location: [facility];  Service: Endoscopy;;   ENDOBRONCHIAL ULTRASOUND N/A 04/20/2021   Procedure: ENDOBRONCHIAL ULTRASOUND;  Surgeon: Lorin Glass, Anna Finley;  Location: [facility];  Service: Endoscopy;  Laterality: N/A;   HIP ARTHROPLASTY     VIDEO BRONCHOSCOPY Left 04/20/2021   Procedure: VIDEO BRONCHOSCOPY WITH FLUORO;  Surgeon: Lorin Glass, Anna Finley;  Location: [facility];  Service: Endoscopy;  Laterality: Left;  EBUS    REVIEW OF SYSTEMS:  Constitutional: positive for fatigue Eyes: negative Ears, nose, mouth, throat, and face: negative Respiratory: negative Cardiovascular: negative Gastrointestinal: negative Genitourinary:negative Integument/breast: negative Hematologic/lymphatic: negative Musculoskeletal:positive for arthralgias Neurological: positive for headaches Behavioral/Psych: negative Endocrine: negative Allergic/Immunologic: negative   PHYSICAL EXAMINATION: General appearance: alert, cooperative, appears older than stated age, cachectic, and fatigued Head: Normocephalic, without obvious abnormality, atraumatic Neck: no adenopathy, no JVD, supple, symmetrical, trachea midline, and thyroid not enlarged, symmetric, no tenderness/mass/nodules Lymph nodes: Cervical, supraclavicular, and axillary nodes normal. Resp: clear to auscultation bilaterally Back: symmetric, no curvature. ROM normal. No CVA tenderness. Cardio: regular rate and rhythm, S1, S2 normal, no murmur, click, rub or gallop GI: soft, non-tender; bowel sounds normal; no masses,  no organomegaly Extremities: extremities normal, atraumatic, no cyanosis or edema Neurologic: Alert and oriented X 3, normal strength and tone. Normal symmetric reflexes. Normal coordination and gait  ECOG PERFORMANCE STATUS: 1 - Symptomatic but completely ambulatory  Blood pressure 115/71, pulse 80, temperature 98.1 F (36.7 C), temperature source Oral, resp. rate 15, weight 85 lb 14.4 oz (39 kg), SpO2 100 %.  LABORATORY DATA: Lab Results  Component Value Date   WBC 4.5 03/21/2022   HGB 11.5 (L) 03/21/2022   HCT 34.9 (L) 03/21/2022   MCV 86.8 03/21/2022   PLT 255 03/21/2022      Chemistry      Component Value Date/Time   NA 133 (L) 03/21/2022 0807   K 3.0 (L) 03/21/2022 0807   CL 95 (L) 03/21/2022 0807   CO2 29 03/21/2022 0807   BUN 13 03/21/2022 0807   CREATININE 0.81 03/21/2022  0807      Component Value Date/Time   CALCIUM 8.8 (L) 03/21/2022 0807   ALKPHOS 121 03/21/2022 0807   AST 28 03/21/2022 0807   ALT 10 03/21/2022 0807   BILITOT 0.5 03/21/2022 0807       RADIOGRAPHIC STUDIES: CT HEAD W & WO CONTRAST ( )  Result Date: 05/04/2022 CLINICAL DATA:  77 year old female with non-small cell lung cancer. Dizziness. Staging. EXAM: CT HEAD WITHOUT AND WITH CONTRAST TECHNIQUE: Contiguous axial images were obtained from the base of the skull through the vertex without and with intravenous contrast. RADIATION DOSE REDUCTION: This exam was performed according to the departmental dose-optimization program which includes automated exposure control, adjustment of the mA and/or kV according to patient size and/or use of iterative reconstruction technique. CONTRAST:  50mL OMNIPAQUE IOHEXOL 300 MG/ML  SOLN COMPARISON:  Head CT 01/27/2022.  Brain MRI 04/19/2021. FINDINGS: Brain: Stable cerebral volume compared to the MRI last year. Confluent chronic bilateral cerebral white matter hypodensity, and chronic heterogeneity in the bilateral deep gray nuclei. No midline shift, ventriculomegaly, mass effect, evidence of mass lesion, intracranial hemorrhage or evidence of cortically based acute infarction. No abnormal enhancement identified. Vascular: Calcified atherosclerosis at the skull base. The major intracranial vascular structures are enhancing following contrast as expected. Skull: Stable.  No acute or suspicious osseous lesion  identified. Sinuses/Orbits: Visualized paranasal sinuses and mastoids are stable and well aerated. Tympanic cavities are clear. Other: Visualized orbits and scalp soft tissues are within normal limits. IMPRESSION: 1. No acute intracranial abnormality or metastatic disease identified. 2. Stable CT appearance of the advanced chronic small vessel disease. Electronically Signed   By: Odessa Fleming M.D.   On: 05/04/2022 03:59   CT CHEST ABDOMEN PELVIS W CONTRAST  Result  Date: 05/03/2022 CLINICAL DATA:  Non-small-cell lung cancer on chemotherapy. Assess treatment response. * Tracking Code: BO * EXAM: CT CHEST, ABDOMEN, AND PELVIS WITH CONTRAST TECHNIQUE: Multidetector CT imaging of the chest, abdomen and pelvis was performed following the standard protocol during bolus administration of intravenous contrast. RADIATION DOSE REDUCTION: This exam was performed according to the departmental dose-optimization program which includes automated exposure control, adjustment of the mA and/or kV according to patient size and/or use of iterative reconstruction technique. CONTRAST:  50mL OMNIPAQUE IOHEXOL 300 MG/ML  SOLN COMPARISON:  CT 01/27/2022 and older FINDINGS: CT CHEST FINDINGS Cardiovascular: Heart is nonenlarged. Trace pericardial fluid. Coronary artery calcifications are seen. The thoracic aorta has a normal course and caliber with scattered atherosclerotic calcified plaque. Mediastinum/Nodes: Patulous esophagus filled with contrast. There is a moderate hiatal hernia with some fold thickening. Stable thyroid gland. No specific abnormal lymph node enlargement identified in the axillary region, hilum or mediastinum. There is a prominent subcarinal lymph node previously which measured 9 mm in short axis on the prior and today on series 4, image 23 measures 7 mm in short axis. Lungs/Pleura: There are centrilobular emphysematous changes seen in both lungs. On the left, no consolidation, pneumothorax or effusion. Stable 2-3 mm nodule left lower lobe on series 5, image 75. Previous small ground-glass nodule left lower lobe more caudal is no longer seen and may have been infectious or inflammatory. There is some apical scarring and atelectatic change, unchanged from previous examination. More caudal there is some bandlike areas of opacity with bronchiectasis extending to the hilum. The discrete mass in the right lower lobe which previously measured 2.1 x 1.8 cm, today measures 2.1 x 1.7 cm on  series 5 image 69. Overall the right lung is similar in appearance to the previous. There is an associated tiny possibly loculated pleural effusion. No pneumothorax. There is debris noted within the right main bronchus. The previous debris in the right lower lobe bronchus is improving. Decreased narrowing of the middle lobe bronchus as well. Volume loss noted to the right hemithorax. Musculoskeletal: Once again there is sclerotic bone metastases identified along the spine. There is moderate compression once again of the T3 vertebral body. By CT the distribution is similar the previous. Follow-up whole-body bone scan as clinically appropriate. Advanced degenerative changes seen of the shoulders. CT ABDOMEN PELVIS FINDINGS Hepatobiliary: Patent portal vein. Gallbladder is present. There are some tiny low-attenuation lesion in the right hepatic lobe which are too small to completely characterize but likely small cysts. No specific imaging follow-up. Pancreas: Mild pancreatic atrophy without mass. Spleen: Normal in size without focal abnormality. Adrenals/Urinary Tract: The adrenal glands are stable with a small right adrenal nodule measuring once again 17 x 7 mm on series 4, image 47. Left adrenal gland is also mildly thickened. There is moderate atrophy of the right kidney and mild left with focal patchy areas. There is some tiny cystic foci as well the kidneys, too small to completely characterize but likely benign and no specific imaging follow-up. Bosniak 2 lesions. Preserved contour of the urinary bladder.  Stomach/Bowel: As stated above there is a moderate hiatal hernia. The stomach otherwise has a normal course and caliber. The small bowel is nondilated. Moderate diffuse colonic stool. Normal appendix in the right lower quadrant. Few colonic diverticula. There is a redundant course to the transverse colon. Vascular/Lymphatic: Normal caliber aorta and IVC with scattered vascular calcifications. Specific abnormal  lymph node enlargement identified in the abdomen and pelvis. No specific abnormal lymph node enlargement seen in the abdomen and pelvis. Reproductive: Heterogeneous uterus with calcifications consistent with multiple fibroids. No separate adnexal mass Other: No free air or free fluid.  Mild anasarca. Musculoskeletal: Significant streak artifact related to the patient's left hip arthroplasty. Once again there are areas of sclerosis noted along the spine and pelvis. Extent and distribution are similar to previous IMPRESSION: Overall no significant interval change. Overall stable in size right lower lobe lung mass, right adrenal nodule and distribution of sclerotic bone metastases. Stable opacity involving the right lung but the level of bronchial narrowing is improving in the middle lobe and lower lobe. Stable loculated tiny right pleural effusion and pericardial effusion. Moderate colonic stool.  Moderate hiatal hernia. Electronically Signed   By: Karen Kays M.D.   On: 05/03/2022 10:31    ASSESSMENT AND PLAN: This is a 77 years old African-American female diagnosed with Stage IV (T3, N2, M1 C) non-small cell lung cancer, adenocarcinoma presented with large right lower lobe lung mass in addition to subcarinal lymphadenopathy as well as right adrenal gland and bone metastasis diagnosed in April 2023.   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   PD-L1 expression of 50%. The patient started her treatment with Tagrisso in May 2023.  She is status post 11.5 months of treatment and has been tolerating her treatment with Tagrisso fairly well. The patient has no complaints today except for mild arthralgia of the right shoulder and intermittent headache. She had repeat CT scan of the head, chest, abdomen and pelvis performed recently.  I personally and independently reviewed the scans and discussed the results with the patient and her granddaughter. Her scan  showed no concerning findings for disease progression. I recommended for her to continue her current treatment with Tagrisso with the same dose. I will see her back for follow-up visit in 2 months for evaluation with repeat blood work. The patient was advised to call immediately if she has any other concerning symptoms in the interval. All questions were answered. The patient knows to call the clinic with any problems, questions or concerns. We can certainly see the patient much sooner if necessary. The total time spent in the appointment was 30 minutes.  Disclaimer: This note was dictated with voice recognition software. Similar sounding words can inadvertently be transcribed and may not be corrected upon review.

## 2022-05-06 ENCOUNTER — Other Ambulatory Visit: Payer: Self-pay

## 2022-05-12 ENCOUNTER — Other Ambulatory Visit (HOSPITAL_COMMUNITY): Payer: Self-pay

## 2022-05-12 ENCOUNTER — Other Ambulatory Visit: Payer: Self-pay

## 2022-05-12 ENCOUNTER — Other Ambulatory Visit: Payer: Self-pay | Admitting: Internal Medicine

## 2022-05-12 MED ORDER — OSIMERTINIB MESYLATE 80 MG PO TABS
80.0000 mg | ORAL_TABLET | Freq: Every day | ORAL | 3 refills | Status: DC
  Filled 2022-05-12: qty 30, 30d supply, fill #0
  Filled 2022-06-08: qty 30, 30d supply, fill #1
  Filled 2022-07-08: qty 30, 30d supply, fill #2
  Filled 2022-08-04: qty 30, 30d supply, fill #3

## 2022-05-17 ENCOUNTER — Other Ambulatory Visit (HOSPITAL_COMMUNITY): Payer: Self-pay

## 2022-05-20 ENCOUNTER — Other Ambulatory Visit: Payer: Self-pay | Admitting: Internal Medicine

## 2022-05-20 DIAGNOSIS — I8290 Acute embolism and thrombosis of unspecified vein: Secondary | ICD-10-CM

## 2022-06-08 ENCOUNTER — Other Ambulatory Visit (HOSPITAL_COMMUNITY): Payer: Self-pay

## 2022-06-10 ENCOUNTER — Other Ambulatory Visit (HOSPITAL_COMMUNITY): Payer: Self-pay

## 2022-07-01 ENCOUNTER — Other Ambulatory Visit: Payer: Self-pay

## 2022-07-05 ENCOUNTER — Other Ambulatory Visit (HOSPITAL_COMMUNITY): Payer: Self-pay

## 2022-07-06 ENCOUNTER — Inpatient Hospital Stay: Payer: 59 | Attending: Physician Assistant

## 2022-07-06 ENCOUNTER — Inpatient Hospital Stay: Payer: 59 | Admitting: Internal Medicine

## 2022-07-08 ENCOUNTER — Other Ambulatory Visit (HOSPITAL_COMMUNITY): Payer: Self-pay

## 2022-07-11 ENCOUNTER — Other Ambulatory Visit (HOSPITAL_COMMUNITY): Payer: Self-pay

## 2022-08-04 ENCOUNTER — Other Ambulatory Visit: Payer: Self-pay

## 2022-08-08 ENCOUNTER — Other Ambulatory Visit (HOSPITAL_COMMUNITY): Payer: Self-pay

## 2022-08-12 ENCOUNTER — Telehealth: Payer: Self-pay | Admitting: Internal Medicine

## 2022-08-12 NOTE — Telephone Encounter (Signed)
Patient is aware of upcoming appointment times/dates.  

## 2022-08-14 ENCOUNTER — Other Ambulatory Visit: Payer: Self-pay

## 2022-08-15 ENCOUNTER — Telehealth: Payer: Self-pay | Admitting: *Deleted

## 2022-08-15 ENCOUNTER — Inpatient Hospital Stay: Payer: 59 | Admitting: Internal Medicine

## 2022-08-15 ENCOUNTER — Inpatient Hospital Stay: Payer: 59

## 2022-08-15 NOTE — Telephone Encounter (Signed)
Daughter Malachi Bonds can not get off work to bring patient to appt today. Asked to r/s later in week. Provider informed and schedule message sent

## 2022-08-18 ENCOUNTER — Telehealth: Payer: Self-pay | Admitting: Internal Medicine

## 2022-08-18 NOTE — Telephone Encounter (Signed)
Called and spoke with patient's daughter regarding rescheduling missed appointment. Patient will be notified of new appointment date/time.

## 2022-08-20 ENCOUNTER — Other Ambulatory Visit: Payer: Self-pay

## 2022-08-22 ENCOUNTER — Other Ambulatory Visit: Payer: Self-pay | Admitting: *Deleted

## 2022-08-22 DIAGNOSIS — C3431 Malignant neoplasm of lower lobe, right bronchus or lung: Secondary | ICD-10-CM

## 2022-08-23 ENCOUNTER — Inpatient Hospital Stay: Payer: 59 | Attending: Physician Assistant

## 2022-08-23 ENCOUNTER — Inpatient Hospital Stay (HOSPITAL_BASED_OUTPATIENT_CLINIC_OR_DEPARTMENT_OTHER): Payer: 59 | Admitting: Internal Medicine

## 2022-08-23 ENCOUNTER — Other Ambulatory Visit: Payer: Self-pay

## 2022-08-23 VITALS — BP 126/65 | HR 76 | Temp 97.7°F | Resp 15 | Wt 87.7 lb

## 2022-08-23 DIAGNOSIS — C7951 Secondary malignant neoplasm of bone: Secondary | ICD-10-CM | POA: Diagnosis not present

## 2022-08-23 DIAGNOSIS — C3431 Malignant neoplasm of lower lobe, right bronchus or lung: Secondary | ICD-10-CM | POA: Diagnosis present

## 2022-08-23 DIAGNOSIS — C3490 Malignant neoplasm of unspecified part of unspecified bronchus or lung: Secondary | ICD-10-CM

## 2022-08-23 DIAGNOSIS — C7971 Secondary malignant neoplasm of right adrenal gland: Secondary | ICD-10-CM | POA: Diagnosis not present

## 2022-08-23 LAB — CBC WITH DIFFERENTIAL (CANCER CENTER ONLY)
Abs Immature Granulocytes: 0.01 10*3/uL (ref 0.00–0.07)
Basophils Absolute: 0 10*3/uL (ref 0.0–0.1)
Basophils Relative: 0 %
Eosinophils Absolute: 0.1 10*3/uL (ref 0.0–0.5)
Eosinophils Relative: 2 %
HCT: 38.2 % (ref 36.0–46.0)
Hemoglobin: 12.5 g/dL (ref 12.0–15.0)
Immature Granulocytes: 0 %
Lymphocytes Relative: 20 %
Lymphs Abs: 0.9 10*3/uL (ref 0.7–4.0)
MCH: 29 pg (ref 26.0–34.0)
MCHC: 32.7 g/dL (ref 30.0–36.0)
MCV: 88.6 fL (ref 80.0–100.0)
Monocytes Absolute: 0.4 10*3/uL (ref 0.1–1.0)
Monocytes Relative: 8 %
Neutro Abs: 3.1 10*3/uL (ref 1.7–7.7)
Neutrophils Relative %: 70 %
Platelet Count: 345 10*3/uL (ref 150–400)
RBC: 4.31 MIL/uL (ref 3.87–5.11)
RDW: 14.4 % (ref 11.5–15.5)
WBC Count: 4.5 10*3/uL (ref 4.0–10.5)
nRBC: 0 % (ref 0.0–0.2)

## 2022-08-23 LAB — CMP (CANCER CENTER ONLY)
ALT: 7 U/L (ref 0–44)
AST: 17 U/L (ref 15–41)
Albumin: 4.1 g/dL (ref 3.5–5.0)
Alkaline Phosphatase: 178 U/L — ABNORMAL HIGH (ref 38–126)
Anion gap: 9 (ref 5–15)
BUN: 15 mg/dL (ref 8–23)
CO2: 29 mmol/L (ref 22–32)
Calcium: 9.3 mg/dL (ref 8.9–10.3)
Chloride: 100 mmol/L (ref 98–111)
Creatinine: 0.8 mg/dL (ref 0.44–1.00)
GFR, Estimated: >60 mL/min (ref >60)
Glucose, Bld: 97 mg/dL (ref 70–99)
Potassium: 3.3 mmol/L — ABNORMAL LOW (ref 3.5–5.1)
Sodium: 138 mmol/L (ref 135–145)
Total Bilirubin: 0.4 mg/dL (ref 0.3–1.2)
Total Protein: 7.5 g/dL (ref 6.5–8.1)

## 2022-08-23 NOTE — Progress Notes (Signed)
[facility] Telephone:([phone]) [phone]   Fax:([phone]  OFFICE PROGRESS NOTE  [name], MD [address] [place] [address]  DIAGNOSIS: Stage IV (T3, N2, M1 C) non-small cell lung cancer, adenocarcinoma presented with large right lower lobe lung mass in addition to subcarinal lymphadenopathy as well as bone metastasis diagnosed in April 2023  Molecular studies by foundation 1 showed EGFR T790M, Exon 19 deletion ([id number]) AKT2- Aplification-equivocal ARID1A- R1751fs*1 RICTOR- amplification-equivocal  PDL1 Expression 50%  PRIOR THERAPY: Palliative radiotherapy to the metastatic T3 bone lesion under the care of Dr. Mitzi Hansen  CURRENT THERAPY: Tagrisso 80 mg p.o. daily.  First dose started May 22, 2021.  Status post 15 months of treatment.  INTERVAL HISTORY: Anna Finley 77 y.o. female returns to the clinic today for follow-up visit accompanied by her granddaughter, Farrell Ours.  The patient is feeling fine today with no concerning complaints.  She denied having any current chest pain, shortness of breath, cough or hemoptysis.  She continues to have some weakness in the lower extremity.  She is tolerating her treatment with Tagrisso fairly well.  She was supposed to come last months for follow-up visit but she missed her appointment because of transportation.  She is here today for evaluation and repeat blood work.  MEDICAL HISTORY: Past Medical History:  Diagnosis Date   COPD (chronic obstructive pulmonary disease) with chronic bronchitis (HCC)    HTN (hypertension)    Pneumothorax     ALLERGIES:  has No Known Allergies.  MEDICATIONS:  Current Outpatient Medications  Medication Sig Dispense Refill   acetaminophen (TYLENOL) 500 MG tablet Take 500 mg by mouth every 6 (six) hours as needed for moderate pain or headache.     apixaban (ELIQUIS) 5 MG TABS tablet TAKE 1 TABLET(5 MG) BY MOUTH TWICE DAILY. FOLLOW UP WITH DISCARD REMAINDER.   FOR ADDITIONAL REFILLS 180 tablet 0   atenolol (TENORMIN) 50 MG tablet Take 1 tablet (50 mg total) by mouth daily. 30 tablet 0   atenolol-chlorthalidone (TENORETIC) 50-25 MG tablet Take 1 tablet by mouth daily.     atorvastatin (LIPITOR) 40 MG tablet Take 40 mg by mouth daily.     gabapentin (NEURONTIN) 300 MG capsule Take 300 mg by mouth 2 (two) times daily.     metFORMIN (GLUCOPHAGE) 500 MG tablet Take 500 mg by mouth daily.     montelukast (SINGULAIR) 10 MG tablet Take 10 mg by mouth at bedtime.     osimertinib mesylate (TAGRISSO) 80 MG tablet Take 1 tablet (80 mg total) by mouth daily. 30 tablet 3   polyethylene glycol (MIRALAX / GLYCOLAX) 17 g packet Take 17 g by mouth daily as needed for mild constipation. Meds to bed, thanks 14 each 0   potassium chloride SA (KLOR-CON M) 20 MEQ tablet Take 1 tablet (20 mEq total) by mouth daily. 7 tablet 0   promethazine-dextromethorphan (PROMETHAZINE-DM) 6.25-15 MG/5ML syrup Take 5 mLs by mouth 4 (four) times daily as needed for cough.     WIXELA INHUB 250-50 MCG/ACT AEPB 1 puff 2 (two) times daily.     No current facility-administered medications for this visit.    SURGICAL HISTORY:  Past Surgical History:  Procedure Laterality Date   BRONCHIAL NEEDLE ASPIRATION BIOPSY  04/20/2021   Procedure: BRONCHIAL NEEDLE ASPIRATION BIOPSIES;  Surgeon: Lorin Glass, MD;  Location: [facility];  Service: Endoscopy;;   BRONCHIAL WASHINGS  04/20/2021   Procedure: BRONCHIAL WASHINGS;  Surgeon: Lorin Glass,  MD;  Location: [facility];  Service: Endoscopy;;   ENDOBRONCHIAL ULTRASOUND N/A 04/20/2021   Procedure: ENDOBRONCHIAL ULTRASOUND;  Surgeon: Lorin Glass, MD;  Location: [facility];  Service: Endoscopy;  Laterality: N/A;   HIP ARTHROPLASTY     VIDEO BRONCHOSCOPY Left 04/20/2021   Procedure: VIDEO BRONCHOSCOPY WITH FLUORO;  Surgeon: Lorin Glass, MD;  Location: [facility];  Service: Endoscopy;  Laterality: Left;  EBUS    REVIEW OF SYSTEMS:   A comprehensive review of systems was negative except for: Constitutional: positive for fatigue Musculoskeletal: positive for muscle weakness   PHYSICAL EXAMINATION: General appearance: alert, cooperative, appears older than stated age, cachectic, and fatigued Head: Normocephalic, without obvious abnormality, atraumatic Neck: no adenopathy, no JVD, supple, symmetrical, trachea midline, and thyroid not enlarged, symmetric, no tenderness/mass/nodules Lymph nodes: Cervical, supraclavicular, and axillary nodes normal. Resp: clear to auscultation bilaterally Back: symmetric, no curvature. ROM normal. No CVA tenderness. Cardio: regular rate and rhythm, S1, S2 normal, no murmur, click, rub or gallop GI: soft, non-tender; bowel sounds normal; no masses,  no organomegaly Extremities: extremities normal, atraumatic, no cyanosis or edema  ECOG PERFORMANCE STATUS: 1 - Symptomatic but completely ambulatory  Blood pressure 126/65, pulse 76, temperature 97.7 F (36.5 C), temperature source Oral, resp. rate 15, weight 87 lb 11.2 oz (39.8 kg), SpO2 100%.  LABORATORY DATA: Lab Results  Component Value Date   WBC 4.5 08/23/2022   HGB 12.5 08/23/2022   HCT 38.2 08/23/2022   MCV 88.6 08/23/2022   PLT 345 08/23/2022      Chemistry      Component Value Date/Time   NA 133 (L) 03/21/2022 0807   K 3.0 (L) 03/21/2022 0807   CL 95 (L) 03/21/2022 0807   CO2 29 03/21/2022 0807   BUN 13 03/21/2022 0807   CREATININE 0.81 03/21/2022 0807      Component Value Date/Time   CALCIUM 8.8 (L) 03/21/2022 0807   ALKPHOS 121 03/21/2022 0807   AST 28 03/21/2022 0807   ALT 10 03/21/2022 0807   BILITOT 0.5 03/21/2022 0807       RADIOGRAPHIC STUDIES: No results found.  ASSESSMENT AND PLAN: This is a 77 years old African-American female diagnosed with Stage IV (T3, N2, M1 C) non-small cell lung cancer, adenocarcinoma presented with large right lower lobe lung mass in addition to subcarinal lymphadenopathy as well  as right adrenal gland and bone metastasis diagnosed in April 2023.   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   PD-L1 expression of 50%. The patient started her treatment with Tagrisso in May 2023.  She is status post 15 months of treatment. The patient has been tolerating this treatment well with no concerning adverse effects. I recommended for her to continue her current treatment with Tagrisso with the same dose. I will see her back for follow-up visit in 2 months for evaluation with repeat CT scan of the chest, abdomen and pelvis for restaging of her disease. The patient was advised to call immediately if she has any other concerning symptoms in the interval. All questions were answered. The patient knows to call the clinic with any problems, questions or concerns. We can certainly see the patient much sooner if necessary. The total time spent in the appointment was 20 minutes.  Disclaimer: This note was dictated with voice recognition software. Similar sounding words can inadvertently be transcribed and may not be corrected upon review.

## 2022-08-26 ENCOUNTER — Other Ambulatory Visit: Payer: Self-pay

## 2022-08-31 ENCOUNTER — Other Ambulatory Visit: Payer: Self-pay

## 2022-08-31 ENCOUNTER — Other Ambulatory Visit (HOSPITAL_COMMUNITY): Payer: Self-pay

## 2022-08-31 ENCOUNTER — Other Ambulatory Visit: Payer: Self-pay | Admitting: Internal Medicine

## 2022-08-31 MED ORDER — OSIMERTINIB MESYLATE 80 MG PO TABS
80.0000 mg | ORAL_TABLET | Freq: Every day | ORAL | 3 refills | Status: DC
  Filled 2022-08-31: qty 30, 30d supply, fill #0
  Filled 2022-09-26: qty 30, 30d supply, fill #1
  Filled 2022-10-25: qty 30, 30d supply, fill #2
  Filled 2022-11-22: qty 30, 30d supply, fill #3

## 2022-09-05 ENCOUNTER — Other Ambulatory Visit (HOSPITAL_COMMUNITY): Payer: Self-pay

## 2022-09-26 ENCOUNTER — Other Ambulatory Visit (HOSPITAL_COMMUNITY): Payer: Self-pay

## 2022-10-03 ENCOUNTER — Other Ambulatory Visit (HOSPITAL_COMMUNITY): Payer: Self-pay

## 2022-10-03 ENCOUNTER — Other Ambulatory Visit: Payer: Self-pay

## 2022-10-18 ENCOUNTER — Other Ambulatory Visit: Payer: Self-pay

## 2022-10-18 ENCOUNTER — Other Ambulatory Visit: Payer: Self-pay | Admitting: Internal Medicine

## 2022-10-18 ENCOUNTER — Ambulatory Visit (HOSPITAL_COMMUNITY)
Admission: RE | Admit: 2022-10-18 | Discharge: 2022-10-18 | Disposition: A | Discharge status: Home / Self Care | Payer: 59 | Source: Ambulatory Visit | Attending: Internal Medicine | Admitting: Internal Medicine

## 2022-10-18 ENCOUNTER — Inpatient Hospital Stay: Payer: 59 | Attending: Physician Assistant

## 2022-10-18 ENCOUNTER — Inpatient Hospital Stay: Payer: 59

## 2022-10-18 DIAGNOSIS — C3431 Malignant neoplasm of lower lobe, right bronchus or lung: Secondary | ICD-10-CM | POA: Diagnosis present

## 2022-10-18 DIAGNOSIS — C7951 Secondary malignant neoplasm of bone: Secondary | ICD-10-CM | POA: Insufficient documentation

## 2022-10-18 DIAGNOSIS — C3490 Malignant neoplasm of unspecified part of unspecified bronchus or lung: Secondary | ICD-10-CM

## 2022-10-18 LAB — CMP (CANCER CENTER ONLY)
ALT: 5 U/L (ref 0–44)
AST: 11 U/L — ABNORMAL LOW (ref 15–41)
Albumin: 3.8 g/dL (ref 3.5–5.0)
Alkaline Phosphatase: 163 U/L — ABNORMAL HIGH (ref 38–126)
Anion gap: 8 (ref 5–15)
BUN: 26 mg/dL — ABNORMAL HIGH (ref 8–23)
CO2: 28 mmol/L (ref 22–32)
Calcium: 9.3 mg/dL (ref 8.9–10.3)
Chloride: 103 mmol/L (ref 98–111)
Creatinine: 0.84 mg/dL (ref 0.44–1.00)
GFR, Estimated: >60 mL/min (ref >60)
Glucose, Bld: 104 mg/dL — ABNORMAL HIGH (ref 70–99)
Potassium: 3.4 mmol/L — ABNORMAL LOW (ref 3.5–5.1)
Sodium: 139 mmol/L (ref 135–145)
Total Bilirubin: 0.3 mg/dL (ref 0.3–1.2)
Total Protein: 6.6 g/dL (ref 6.5–8.1)

## 2022-10-18 LAB — CBC WITH DIFFERENTIAL (CANCER CENTER ONLY)
Abs Immature Granulocytes: 0.01 10*3/uL (ref 0.00–0.07)
Basophils Absolute: 0 10*3/uL (ref 0.0–0.1)
Basophils Relative: 0 %
Eosinophils Absolute: 0.1 10*3/uL (ref 0.0–0.5)
Eosinophils Relative: 1 %
HCT: 34.9 % — ABNORMAL LOW (ref 36.0–46.0)
Hemoglobin: 11.3 g/dL — ABNORMAL LOW (ref 12.0–15.0)
Immature Granulocytes: 0 %
Lymphocytes Relative: 15 %
Lymphs Abs: 1 10*3/uL (ref 0.7–4.0)
MCH: 28.8 pg (ref 26.0–34.0)
MCHC: 32.4 g/dL (ref 30.0–36.0)
MCV: 89 fL (ref 80.0–100.0)
Monocytes Absolute: 0.5 10*3/uL (ref 0.1–1.0)
Monocytes Relative: 8 %
Neutro Abs: 4.9 10*3/uL (ref 1.7–7.7)
Neutrophils Relative %: 76 %
Platelet Count: 336 10*3/uL (ref 150–400)
RBC: 3.92 MIL/uL (ref 3.87–5.11)
RDW: 15.7 % — ABNORMAL HIGH (ref 11.5–15.5)
WBC Count: 6.5 10*3/uL (ref 4.0–10.5)
nRBC: 0 % (ref 0.0–0.2)

## 2022-10-18 MED ORDER — IOHEXOL 300 MG/ML  SOLN
100.0000 mL | Freq: Once | INTRAMUSCULAR | Status: AC | PRN
  Administered 2022-10-18: 75 mL via INTRAVENOUS

## 2022-10-24 ENCOUNTER — Inpatient Hospital Stay: Payer: 59 | Admitting: Internal Medicine

## 2022-10-24 ENCOUNTER — Telehealth: Payer: Self-pay | Admitting: Medical Oncology

## 2022-10-24 NOTE — Telephone Encounter (Signed)
Anna Finley did not know about appt today . Schedule message sent to r/s with Anna Finley.

## 2022-10-25 ENCOUNTER — Other Ambulatory Visit (HOSPITAL_COMMUNITY): Payer: Self-pay

## 2022-10-25 ENCOUNTER — Other Ambulatory Visit (HOSPITAL_COMMUNITY): Payer: Self-pay | Admitting: Pharmacy Technician

## 2022-10-25 NOTE — Progress Notes (Signed)
Specialty Pharmacy Refill Coordination Note  Anna Finley is a 77 y.o. female contacted today regarding refills of specialty medication(s) Osimertinib Mesylate   Patient requested Delivery   Delivery date: 11/01/22   Verified address: [address] [place]   Medication will be filled on 10/31/22.

## 2022-10-29 ENCOUNTER — Other Ambulatory Visit: Payer: Self-pay

## 2022-10-31 ENCOUNTER — Telehealth: Payer: Self-pay | Admitting: Internal Medicine

## 2022-10-31 NOTE — Telephone Encounter (Signed)
Scheduled per 10/07 scheduling message, patient has been called and notified.

## 2022-11-01 ENCOUNTER — Other Ambulatory Visit: Payer: Self-pay

## 2022-11-06 ENCOUNTER — Other Ambulatory Visit: Payer: Self-pay | Admitting: Physician Assistant

## 2022-11-06 DIAGNOSIS — C3490 Malignant neoplasm of unspecified part of unspecified bronchus or lung: Secondary | ICD-10-CM

## 2022-11-06 NOTE — Progress Notes (Deleted)
[facility] OFFICE PROGRESS NOTE  Anna Plum, MD [address] [place] [address]  DIAGNOSIS: Stage IV (T3, N2, M1 C) non-small cell lung cancer, adenocarcinoma presented with large right lower lobe lung mass in addition to subcarinal lymphadenopathy as well as bone metastasis diagnosed in April 2023   Molecular studies by foundation 1 showed EGFR T790M, Exon 19 deletion [id number]) AKT2- Aplification-equivocal ARID1A- R1766fs*1 RICTOR- amplification-equivocal   PDL1 Expression 50%  PRIOR THERAPY: Palliative radiotherapy to the metastatic T3 bone lesion under the care of Dr. Mitzi Finley   CURRENT THERAPY:  Tagrisso 80 mg p.o. daily.  First dose started May 22, 2021.  Status post *** months of treatment.   INTERVAL HISTORY: Anna Finley 77 y.o. female returns to the clinic today for follow-up visit accompanied by her granddaughter.  The patient was last seen in the clinic by Dr. Arbutus Finley on 08/23/2022.  She is currently undergoing targeted treatment with Tagrisso.  In the interval since she was last being seen, the patient was hospitalized at [facility] for a fall that resulted in  fracture. She underwent left hip arthroplasty on 10/23/22.   Since being discharged, she is feeling ***.  Denies fevers, shortness of breath, chest pain, or sore throat. Denies sick contacts.   Anemia ***  Ask about UTI symtpoms and bleeding?  Regarding her targeted treatment with Tagrisso, she is tolerating this well. She states despite being lost to follow up, that she has been taking her Tagrisso. She has poor nutrition and is very cachectic appearing at baseline. She reports a good appetite though. Denies any nausea, vomiting, diarrhea, or constipation.  Denies any rashes or skin changes.  She does report some intermittent headaches. Breathing ***. She recently had a restaging CT scan performed. She is here for evaluation and to review her scan results.    MEDICAL HISTORY: Past Medical History:  Diagnosis Date   COPD (chronic obstructive pulmonary disease) with chronic bronchitis (HCC)    HTN (hypertension)    Pneumothorax     ALLERGIES:  has No Known Allergies.  MEDICATIONS:  Current Outpatient Medications  Medication Sig Dispense Refill   acetaminophen (TYLENOL) 500 MG tablet Take 500 mg by mouth every 6 (six) hours as needed for moderate pain or headache.     apixaban (ELIQUIS) 5 MG TABS tablet TAKE 1 TABLET(5 MG) BY MOUTH TWICE DAILY. FOLLOW UP WITH DISCARD REMAINDER. MOHAMED FOR ADDITIONAL REFILLS 180 tablet 0   atenolol (TENORMIN) 50 MG tablet Take 1 tablet (50 mg total) by mouth daily. 30 tablet 0   atenolol-chlorthalidone (TENORETIC) 50-25 MG tablet Take 1 tablet by mouth daily.     atorvastatin (LIPITOR) 40 MG tablet Take 40 mg by mouth daily.     gabapentin (NEURONTIN) 300 MG capsule Take 300 mg by mouth 2 (two) times daily.     metFORMIN (GLUCOPHAGE) 500 MG tablet Take 500 mg by mouth daily.     montelukast (SINGULAIR) 10 MG tablet Take 10 mg by mouth at bedtime.     osimertinib mesylate (TAGRISSO) 80 MG tablet Take 1 tablet (80 mg total) by mouth daily. 30 tablet 3   polyethylene glycol (MIRALAX / GLYCOLAX) 17 g packet Take 17 g by mouth daily as needed for mild constipation. Meds to bed, thanks 14 each 0   potassium chloride SA (KLOR-CON M) 20 MEQ tablet Take 1 tablet (20 mEq total) by mouth daily. 7 tablet 0   promethazine-dextromethorphan (PROMETHAZINE-DM) 6.25-15 MG/5ML syrup Take 5  mLs by mouth 4 (four) times daily as needed for cough.     WIXELA INHUB 250-50 MCG/ACT AEPB 1 puff 2 (two) times daily.     No current facility-administered medications for this visit.    SURGICAL HISTORY:  Past Surgical History:  Procedure Laterality Date   BRONCHIAL NEEDLE ASPIRATION BIOPSY  04/20/2021   Procedure: BRONCHIAL NEEDLE ASPIRATION BIOPSIES;  Surgeon: Lorin Glass, MD;  Location: [facility];  Service: Endoscopy;;    BRONCHIAL WASHINGS  04/20/2021   Procedure: BRONCHIAL WASHINGS;  Surgeon: Lorin Glass, MD;  Location: [facility];  Service: Endoscopy;;   ENDOBRONCHIAL ULTRASOUND N/A 04/20/2021   Procedure: ENDOBRONCHIAL ULTRASOUND;  Surgeon: Lorin Glass, MD;  Location: [facility];  Service: Endoscopy;  Laterality: N/A;   HIP ARTHROPLASTY     VIDEO BRONCHOSCOPY Left 04/20/2021   Procedure: VIDEO BRONCHOSCOPY WITH FLUORO;  Surgeon: Lorin Glass, MD;  Location: [facility];  Service: Endoscopy;  Laterality: Left;  EBUS    REVIEW OF SYSTEMS:   Review of Systems  Constitutional: Negative for appetite change, chills, fatigue, fever and unexpected weight change.  HENT:   Negative for mouth sores, nosebleeds, sore throat and trouble swallowing.   Eyes: Negative for eye problems and icterus.  Respiratory: Negative for cough, hemoptysis, shortness of breath and wheezing.   Cardiovascular: Negative for chest pain and leg swelling.  Gastrointestinal: Negative for abdominal pain, constipation, diarrhea, nausea and vomiting.  Genitourinary: Negative for bladder incontinence, difficulty urinating, dysuria, frequency and hematuria.   Musculoskeletal: Negative for back pain, gait problem, neck pain and neck stiffness.  Skin: Negative for itching and rash.  Neurological: Negative for dizziness, extremity weakness, gait problem, headaches, light-headedness and seizures.  Hematological: Negative for adenopathy. Does not bruise/bleed easily.  Psychiatric/Behavioral: Negative for confusion, depression and sleep disturbance. The patient is not nervous/anxious.     PHYSICAL EXAMINATION:  There were no vitals taken for this visit.  ECOG PERFORMANCE STATUS: {CHL ONC ECOG [id number]  Physical Exam  Constitutional: Oriented to person, place, and time and well-developed, well-nourished, and in no distress. No distress.  HENT:  Head: Normocephalic and atraumatic.  Mouth/Throat: Oropharynx is clear and moist. No  oropharyngeal exudate.  Eyes: Conjunctivae are normal. Right eye exhibits no discharge. Left eye exhibits no discharge. No scleral icterus.  Neck: Normal range of motion. Neck supple.  Cardiovascular: Normal rate, regular rhythm, normal heart sounds and intact distal pulses.   Pulmonary/Chest: Effort normal and breath sounds normal. No respiratory distress. No wheezes. No rales.  Abdominal: Soft. Bowel sounds are normal. Exhibits no distension and no mass. There is no tenderness.  Musculoskeletal: Normal range of motion. Exhibits no edema.  Lymphadenopathy:    No cervical adenopathy.  Neurological: Alert and oriented to person, place, and time. Exhibits normal muscle tone. Gait normal. Coordination normal.  Skin: Skin is warm and dry. No rash noted. Not diaphoretic. No erythema. No pallor.  Psychiatric: Mood, memory and judgment normal.  Vitals reviewed.  LABORATORY DATA: Lab Results  Component Value Date   WBC 6.5 10/18/2022   HGB 11.3 (L) 10/18/2022   HCT 34.9 (L) 10/18/2022   MCV 89.0 10/18/2022   PLT 336 10/18/2022      Chemistry      Component Value Date/Time   NA 139 10/18/2022 1557   K 3.4 (L) 10/18/2022 1557   CL 103 10/18/2022 1557   CO2 28 10/18/2022 1557   BUN 26 (H) 10/18/2022 1557   CREATININE 0.84 10/18/2022 1557  Component Value Date/Time   CALCIUM 9.3 10/18/2022 1557   ALKPHOS 163 (H) 10/18/2022 1557   AST 11 (L) 10/18/2022 1557   ALT 5 10/18/2022 1557   BILITOT 0.3 10/18/2022 1557       RADIOGRAPHIC STUDIES:  CT CHEST WO CONTRAST  Result Date: 10/18/2022 CLINICAL DATA:  Non-small cell lung cancer; * Tracking Code: BO * EXAM: CT CHEST WITHOUT CONTRAST ABDOMEN AND PELVIS WITH CONTRAST TECHNIQUE: Multidetector CT imaging of the chest, abdomen and pelvis was performed following the standard protocol during bolus administration of intravenous contrast. RADIATION DOSE REDUCTION: This exam was performed according to the departmental dose-optimization  program which includes automated exposure control, adjustment of the mA and/or kV according to patient size and/or use of iterative reconstruction technique. CONTRAST:  75mL OMNIPAQUE IOHEXOL 300 MG/ML  SOLN COMPARISON:  None Available. CT chest, abdomen and pelvis dated April 30, 2022 FINDINGS: CT CHEST FINDINGS Cardiovascular: Normal heart size. No pericardial effusion. Normal caliber thoracic aorta with severe calcified plaque. Severe coronary artery calcifications. Mediastinum/Nodes: Moderate hiatal hernia. Thyroid is unremarkable. No enlarged lymph nodes seen in the chest. Lungs/Pleura: Debris is seen in the right mainstem and lower lobe bronchi. Stable right lower lobe nodule measuring 2.1 x 1.7 cm. Stable right perihilar postradiation change. No pleural effusion. Centrilobular emphysema. Musculoskeletal: Advanced degenerative changes of the bilateral shoulders. Unchanged sclerosis of the T3 vertebral body with moderate height loss. CT ABDOMEN PELVIS FINDINGS Hepatobiliary: Stable small low-attenuation lesions of the right hepatic lobe, too small to accurately characterize but likely simple cysts. Suspicious liver lesions. Gallbladder is unremarkable. Unchanged mild biliary ductal dilation. Pancreas: Unremarkable. No pancreatic ductal dilatation or surrounding inflammatory changes. Spleen: Normal in size without focal abnormality. Adrenals/Urinary Tract: Stable right adrenal gland nodule measuring 1.7 cm. No hydronephrosis or nephrolithiasis. Bilateral renal scarring. Bladder wall thickening. Stomach/Bowel: Stomach is within normal limits. Appendix appears normal. No evidence of bowel wall thickening, distention, or inflammatory changes. Vascular/Lymphatic: Aortic atherosclerosis. No enlarged abdominal or pelvic lymph nodes. Reproductive: Fibroid uterus. Other: No abdominal wall hernia or abnormality. No abdominopelvic ascites. Musculoskeletal: Prior left total hip arthroplasty. Unchanged sclerotic lesions of  the sacrum. IMPRESSION: 1. Stable right lower nodule and right perihilar postradiation change. 2. New debris seen in the right mainstem and right lower lobe bronchi, likely due to aspiration. 3. Stable right adrenal gland nodule. 4. Unchanged sclerotic osseous metastatic disease. 5. Moderate bladder wall thickening, findings can be seen in the setting of infection. Correlate with urinalysis. 6. Aortic Atherosclerosis (ICD10-I70.0) and Emphysema (ICD10-J43.9). Electronically Signed   By: Allegra Lai M.D.   On: 10/18/2022 19:29   CT ABDOMEN PELVIS W CONTRAST  Result Date: 10/18/2022 CLINICAL DATA:  Non-small cell lung cancer; * Tracking Code: BO * EXAM: CT CHEST WITHOUT CONTRAST ABDOMEN AND PELVIS WITH CONTRAST TECHNIQUE: Multidetector CT imaging of the chest, abdomen and pelvis was performed following the standard protocol during bolus administration of intravenous contrast. RADIATION DOSE REDUCTION: This exam was performed according to the departmental dose-optimization program which includes automated exposure control, adjustment of the mA and/or kV according to patient size and/or use of iterative reconstruction technique. CONTRAST:  75mL OMNIPAQUE IOHEXOL 300 MG/ML  SOLN COMPARISON:  None Available. CT chest, abdomen and pelvis dated April 30, 2022 FINDINGS: CT CHEST FINDINGS Cardiovascular: Normal heart size. No pericardial effusion. Normal caliber thoracic aorta with severe calcified plaque. Severe coronary artery calcifications. Mediastinum/Nodes: Moderate hiatal hernia. Thyroid is unremarkable. No enlarged lymph nodes seen in the chest. Lungs/Pleura: Debris is seen in  the right mainstem and lower lobe bronchi. Stable right lower lobe nodule measuring 2.1 x 1.7 cm. Stable right perihilar postradiation change. No pleural effusion. Centrilobular emphysema. Musculoskeletal: Advanced degenerative changes of the bilateral shoulders. Unchanged sclerosis of the T3 vertebral body with moderate height loss. CT  ABDOMEN PELVIS FINDINGS Hepatobiliary: Stable small low-attenuation lesions of the right hepatic lobe, too small to accurately characterize but likely simple cysts. Suspicious liver lesions. Gallbladder is unremarkable. Unchanged mild biliary ductal dilation. Pancreas: Unremarkable. No pancreatic ductal dilatation or surrounding inflammatory changes. Spleen: Normal in size without focal abnormality. Adrenals/Urinary Tract: Stable right adrenal gland nodule measuring 1.7 cm. No hydronephrosis or nephrolithiasis. Bilateral renal scarring. Bladder wall thickening. Stomach/Bowel: Stomach is within normal limits. Appendix appears normal. No evidence of bowel wall thickening, distention, or inflammatory changes. Vascular/Lymphatic: Aortic atherosclerosis. No enlarged abdominal or pelvic lymph nodes. Reproductive: Fibroid uterus. Other: No abdominal wall hernia or abnormality. No abdominopelvic ascites. Musculoskeletal: Prior left total hip arthroplasty. Unchanged sclerotic lesions of the sacrum. IMPRESSION: 1. Stable right lower nodule and right perihilar postradiation change. 2. New debris seen in the right mainstem and right lower lobe bronchi, likely due to aspiration. 3. Stable right adrenal gland nodule. 4. Unchanged sclerotic osseous metastatic disease. 5. Moderate bladder wall thickening, findings can be seen in the setting of infection. Correlate with urinalysis. 6. Aortic Atherosclerosis (ICD10-I70.0) and Emphysema (ICD10-J43.9). Electronically Signed   By: Allegra Lai M.D.   On: 10/18/2022 19:29     ASSESSMENT/PLAN:  This is a very pleasant 77 year old African-American female diagnosed with stage IV (T3, N2, M1 C) non-small cell lung cancer, adenocarcinoma.  She presented with a left lower lobe lung mass in addition to subcarinal adenopathy.  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  She was diagnosed in April 2023.   She underwent palliative radiotherapy to the  metastatic T3 bone lesion under the care of Dr. Mitzi Finley   Her molecular studies show that she is positive for an EGFR mutation with deletion in exon 19 in addition to T790 and.  Her PD-L1 expression is 50 percent.   Therefore the patient is currently undergoing targeted treatment with Tagrisso 80 mg p.o. daily rather than immunotherapy.   Her first dose of treatment was on 05/22/21. She is tolerating this well.   The patient was seen with Dr. Arbutus Finley today.  Dr. Arbutus Finley personally and independently reviewed the scan and discussed results with the patient today.  The scan showed ***.  Dr. Arbutus Finley recommends ***  I will arrange for ***.   We will see her back for a follow up visit in *** with repeat lab work.   Due to her anemia, she had additional iron studies performed.   UTI?   The patient was advised to call immediately if she has any concerning symptoms in the interval. The patient voices understanding of current disease status and treatment options and is in agreement with the current care plan. All questions were answered. The patient knows to call the clinic with any problems, questions or concerns. We can certainly see the patient much sooner if necessary   No orders of the defined types were placed in this encounter.    I spent {CHL ONC TIME VISIT - [id number]} counseling the patient face to face. The total time spent in the appointment was {CHL ONC TIME VISIT - [id number]}.  Dannell Raczkowski L Ahmaad Neidhardt, PA-C 11/06/22

## 2022-11-08 ENCOUNTER — Inpatient Hospital Stay: Payer: 59 | Admitting: Physician Assistant

## 2022-11-12 ENCOUNTER — Other Ambulatory Visit: Payer: Self-pay

## 2022-11-15 ENCOUNTER — Telehealth: Payer: Self-pay | Admitting: Internal Medicine

## 2022-11-15 NOTE — Telephone Encounter (Signed)
Scheduled appointments per referral. Patient is aware of the appointment time and date as well as the address. Patient was informed to arrive 10-15 minutes prior with updated insurance information. All questions were answered.

## 2022-11-17 ENCOUNTER — Other Ambulatory Visit: Payer: Self-pay

## 2022-11-22 ENCOUNTER — Other Ambulatory Visit (HOSPITAL_COMMUNITY): Payer: Self-pay

## 2022-11-22 ENCOUNTER — Other Ambulatory Visit (HOSPITAL_COMMUNITY): Payer: Self-pay | Admitting: Pharmacy Technician

## 2022-11-22 NOTE — Progress Notes (Signed)
Specialty Pharmacy Refill Coordination Note  Anna Finley is a 77 y.o. female contacted today regarding refills of specialty medication(s) Osimertinib Mesylate  Spoke with daughter   Patient requested Delivery   Delivery date: 11/29/22   Verified address: [address]  [place]   Medication will be filled on 11/28/22.

## 2022-11-25 NOTE — Progress Notes (Deleted)
[facility] OFFICE PROGRESS NOTE  [name], MD [address] [place] [address]  DIAGNOSIS: Stage IV (T3, N2, M1 C) non-small cell lung cancer, adenocarcinoma presented with large right lower lobe lung mass in addition to subcarinal lymphadenopathy as well as bone metastasis diagnosed in April 2023   Molecular studies by foundation 1 showed EGFR T790M, Exon 19 deletion [id number]) AKT2- Aplification-equivocal ARID1A- R176fs*1 RICTOR- amplification-equivocal   PDL1 Expression 50%  PRIOR THERAPY: Palliative radiotherapy to the metastatic T3 bone lesion under the care of Dr. Mitzi Hansen   CURRENT THERAPY: Tagrisso 80 mg p.o. daily.  First dose started May 22, 2021.  Status post *** months of treatment.   INTERVAL HISTORY: Anna Finley 77 y.o. female returns freturns to the clinic today for follow-up visit accompanied by her granddaughter.  The patient was last seen in the clinic by Dr. Arbutus Ped on 08/23/2022.  She is currently undergoing targeted treatment with Tagrisso.  In the interval since she was last being seen, the patient was hospitalized at [facility] for a fall that resulted in  fracture. She underwent left hip arthroplasty on 10/23/22.   She was supposed to follow up in the clinic but was in serious pain and left before being seen and presented back at the [facility] ER. 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  Denies fevers, shortness of breath, chest pain, or sore throat. Denies sick contacts.   Anemia ***  Ask about UTI symtpoms and bleeding?  Regarding her targeted treatment with Tagrisso, she is tolerating this well. She states despite being lost to follow up, that she has been taking her Tagrisso. She has poor nutrition and is very cachectic appearing at baseline. She reports a good appetite though. Denies any nausea, vomiting,  diarrhea, or constipation.  Denies any rashes or skin changes.  She does report some intermittent headaches. Breathing ***. She recently had a restaging CT scan performed. She is here for evaluation and to review her scan results.  MEDICAL HISTORY: Past Medical History:  Diagnosis Date   COPD (chronic obstructive pulmonary disease) with chronic bronchitis (HCC)    HTN (hypertension)    Pneumothorax     ALLERGIES:  has No Known Allergies.  MEDICATIONS:  Current Outpatient Medications  Medication Sig Dispense Refill   acetaminophen (TYLENOL) 500 MG tablet Take 500 mg by mouth every 6 (six) hours as needed for moderate pain or headache.     apixaban (ELIQUIS) 5 MG TABS tablet TAKE 1 TABLET(5 MG) BY MOUTH TWICE DAILY. FOLLOW UP WITH DISCARD REMAINDER. MOHAMED FOR ADDITIONAL REFILLS 180 tablet 0   atenolol (TENORMIN) 50 MG tablet Take 1 tablet (50 mg total) by mouth daily. 30 tablet 0   atenolol-chlorthalidone (TENORETIC) 50-25 MG tablet Take 1 tablet by mouth daily.     atorvastatin (LIPITOR) 40 MG tablet Take 40 mg by mouth daily.     gabapentin (NEURONTIN) 300 MG capsule Take 300 mg by mouth 2 (two) times daily.     metFORMIN (GLUCOPHAGE) 500 MG tablet Take 500 mg by mouth daily.     montelukast (SINGULAIR) 10 MG tablet Take 10 mg by mouth at bedtime.     osimertinib mesylate (TAGRISSO) 80 MG tablet Take 1 tablet (80 mg total) by mouth daily. 30 tablet 3   polyethylene glycol (MIRALAX / GLYCOLAX) 17 g packet Take 17 g by mouth daily  as needed for mild constipation. Meds to bed, thanks 14 each 0   potassium chloride SA (KLOR-CON M) 20 MEQ tablet Take 1 tablet (20 mEq total) by mouth daily. 7 tablet 0   promethazine-dextromethorphan (PROMETHAZINE-DM) 6.25-15 MG/5ML syrup Take 5 mLs by mouth 4 (four) times daily as needed for cough.     WIXELA INHUB 250-50 MCG/ACT AEPB 1 puff 2 (two) times daily.     No current facility-administered medications for this visit.    SURGICAL HISTORY:  Past  Surgical History:  Procedure Laterality Date   BRONCHIAL NEEDLE ASPIRATION BIOPSY  04/20/2021   Procedure: BRONCHIAL NEEDLE ASPIRATION BIOPSIES;  Surgeon: Lorin Glass, MD;  Location: [facility];  Service: Endoscopy;;   BRONCHIAL WASHINGS  04/20/2021   Procedure: BRONCHIAL WASHINGS;  Surgeon: Lorin Glass, MD;  Location: [facility];  Service: Endoscopy;;   ENDOBRONCHIAL ULTRASOUND N/A 04/20/2021   Procedure: ENDOBRONCHIAL ULTRASOUND;  Surgeon: Lorin Glass, MD;  Location: [facility];  Service: Endoscopy;  Laterality: N/A;   HIP ARTHROPLASTY     VIDEO BRONCHOSCOPY Left 04/20/2021   Procedure: VIDEO BRONCHOSCOPY WITH FLUORO;  Surgeon: Lorin Glass, MD;  Location: [facility];  Service: Endoscopy;  Laterality: Left;  EBUS    REVIEW OF SYSTEMS:   Review of Systems  Constitutional: Negative for appetite change, chills, fatigue, fever and unexpected weight change.  HENT:   Negative for mouth sores, nosebleeds, sore throat and trouble swallowing.   Eyes: Negative for eye problems and icterus.  Respiratory: Negative for cough, hemoptysis, shortness of breath and wheezing.   Cardiovascular: Negative for chest pain and leg swelling.  Gastrointestinal: Negative for abdominal pain, constipation, diarrhea, nausea and vomiting.  Genitourinary: Negative for bladder incontinence, difficulty urinating, dysuria, frequency and hematuria.   Musculoskeletal: Negative for back pain, gait problem, neck pain and neck stiffness.  Skin: Negative for itching and rash.  Neurological: Negative for dizziness, extremity weakness, gait problem, headaches, light-headedness and seizures.  Hematological: Negative for adenopathy. Does not bruise/bleed easily.  Psychiatric/Behavioral: Negative for confusion, depression and sleep disturbance. The patient is not nervous/anxious.     PHYSICAL EXAMINATION:  There were no vitals taken for this visit.  ECOG PERFORMANCE STATUS: {CHL ONC ECOG  [id number]  Physical Exam  Constitutional: Oriented to person, place, and time and well-developed, well-nourished, and in no distress. No distress.  HENT:  Head: Normocephalic and atraumatic.  Mouth/Throat: Oropharynx is clear and moist. No oropharyngeal exudate.  Eyes: Conjunctivae are normal. Right eye exhibits no discharge. Left eye exhibits no discharge. No scleral icterus.  Neck: Normal range of motion. Neck supple.  Cardiovascular: Normal rate, regular rhythm, normal heart sounds and intact distal pulses.   Pulmonary/Chest: Effort normal and breath sounds normal. No respiratory distress. No wheezes. No rales.  Abdominal: Soft. Bowel sounds are normal. Exhibits no distension and no mass. There is no tenderness.  Musculoskeletal: Normal range of motion. Exhibits no edema.  Lymphadenopathy:    No cervical adenopathy.  Neurological: Alert and oriented to person, place, and time. Exhibits normal muscle tone. Gait normal. Coordination normal.  Skin: Skin is warm and dry. No rash noted. Not diaphoretic. No erythema. No pallor.  Psychiatric: Mood, memory and judgment normal.  Vitals reviewed.  LABORATORY DATA: Lab Results  Component Value Date   WBC 6.5 10/18/2022   HGB 11.3 (L) 10/18/2022   HCT 34.9 (L) 10/18/2022   MCV 89.0 10/18/2022   PLT 336 10/18/2022      Chemistry      Component  Value Date/Time   NA 139 10/18/2022 1557   K 3.4 (L) 10/18/2022 1557   CL 103 10/18/2022 1557   CO2 28 10/18/2022 1557   BUN 26 (H) 10/18/2022 1557   CREATININE 0.84 10/18/2022 1557      Component Value Date/Time   CALCIUM 9.3 10/18/2022 1557   ALKPHOS 163 (H) 10/18/2022 1557   AST 11 (L) 10/18/2022 1557   ALT 5 10/18/2022 1557   BILITOT 0.3 10/18/2022 1557       RADIOGRAPHIC STUDIES:  No results found.   ASSESSMENT/PLAN:  ASSESSMENT/PLAN:  This is a very pleasant 77 year old African-American female diagnosed with stage IV (T3, N2, M1 C) non-small cell lung cancer,  adenocarcinoma.  She presented with a left lower lobe lung mass in addition to subcarinal adenopathy.  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  She was diagnosed in April 2023.   She underwent palliative radiotherapy to the metastatic T3 bone lesion under the care of Dr. Mitzi Hansen   Her molecular studies show that she is positive for an EGFR mutation with deletion in exon 19 in addition to T790 and.  Her PD-L1 expression is 50 percent.   Therefore the patient is currently undergoing targeted treatment with Tagrisso 80 mg p.o. daily rather than immunotherapy.   Her first dose of treatment was on 05/22/21. She is tolerating this well.   The patient was seen with Dr. Arbutus Ped today.  Dr. Arbutus Ped personally and independently reviewed the scan and discussed results with the patient today.  The scan showed ***.  Dr. Arbutus Ped recommends ***  I will arrange for ***.   We will see her back for a follow up visit in *** with repeat lab work.   Due to her anemia, she had additional iron studies performed.   UTI?   The patient was advised to call immediately if she has any concerning symptoms in the interval. The patient voices understanding of current disease status and treatment options and is in agreement with the current care plan. All questions were answered. The patient knows to call the clinic with any problems, questions or concerns. We can certainly see the patient much sooner if necessary  No orders of the defined types were placed in this encounter.    I spent {CHL ONC TIME VISIT - WUJWJ:[id number]} counseling the patient face to face. The total time spent in the appointment was {CHL ONC TIME VISIT - OZHYQ:[id number]}.  Icarus Partch L Raylyn Speckman, PA-C 11/25/22

## 2022-11-29 ENCOUNTER — Inpatient Hospital Stay: Payer: 59 | Attending: Physician Assistant

## 2022-11-29 ENCOUNTER — Inpatient Hospital Stay: Payer: 59 | Admitting: Physician Assistant

## 2022-12-01 ENCOUNTER — Other Ambulatory Visit: Payer: Self-pay

## 2022-12-07 ENCOUNTER — Telehealth: Payer: Self-pay | Admitting: Physician Assistant

## 2022-12-07 NOTE — Telephone Encounter (Signed)
Patient is aware that appointment has been pushed out due to patient having hip surgery

## 2022-12-08 ENCOUNTER — Other Ambulatory Visit: Payer: Self-pay

## 2022-12-13 ENCOUNTER — Other Ambulatory Visit: Payer: Self-pay

## 2022-12-19 ENCOUNTER — Other Ambulatory Visit: Payer: Self-pay | Admitting: Internal Medicine

## 2022-12-19 ENCOUNTER — Other Ambulatory Visit: Payer: Self-pay

## 2022-12-19 MED ORDER — OSIMERTINIB MESYLATE 80 MG PO TABS
80.0000 mg | ORAL_TABLET | Freq: Every day | ORAL | 3 refills | Status: DC
  Filled 2022-12-19: qty 30, 30d supply, fill #0
  Filled 2023-02-01: qty 30, 30d supply, fill #1
  Filled 2023-03-01: qty 30, 30d supply, fill #2
  Filled 2023-04-03: qty 30, 30d supply, fill #3

## 2022-12-19 NOTE — Progress Notes (Signed)
Specialty Pharmacy Refill Coordination Note  Raghad Kever is a 77 y.o. female contacted today regarding refills of specialty medication(s) Osimertinib Mesylate   Patient requested Delivery   Delivery date: 12/22/22   Verified address: [address]   Medication will be filled on 12/21/22. Pending Refill Request.

## 2022-12-19 NOTE — Progress Notes (Signed)
Specialty Pharmacy Ongoing Clinical Assessment Note  Anna Finley is a 77 y.o. female who is being followed by the specialty pharmacy service for RxSp Oncology   Patient's specialty medication(s) reviewed today: Osimertinib Mesylate   Missed doses in the last 4 weeks: 0   Patient/Caregiver did not have any additional questions or concerns.   Therapeutic benefit summary: Unable to assess   Adverse events/side effects summary: No adverse events/side effects   Patient's therapy is appropriate to: Continue    Goals Addressed             This Visit's Progress    Stabilization of disease       Patient is on track. Patient will maintain adherence         Follow up:  6 months  [name] Specialty Pharmacist

## 2022-12-22 ENCOUNTER — Ambulatory Visit: Payer: 59 | Admitting: Physician Assistant

## 2022-12-22 ENCOUNTER — Other Ambulatory Visit: Payer: Self-pay

## 2022-12-22 ENCOUNTER — Other Ambulatory Visit: Payer: 59

## 2022-12-22 ENCOUNTER — Ambulatory Visit: Payer: 59 | Admitting: Internal Medicine

## 2022-12-22 NOTE — Progress Notes (Signed)
12/22/22: Delay in delivery. Left patient a voicemail with new delivery date-- Monday 12/26/22.

## 2023-01-12 ENCOUNTER — Other Ambulatory Visit: Payer: Self-pay

## 2023-01-16 ENCOUNTER — Inpatient Hospital Stay (HOSPITAL_BASED_OUTPATIENT_CLINIC_OR_DEPARTMENT_OTHER)
Admission: EM | Admit: 2023-01-16 | Discharge: 2023-01-27 | DRG: 870 | Disposition: A | Discharge status: Skilled Nursing Facility | Payer: 59 | Attending: Internal Medicine | Admitting: Internal Medicine

## 2023-01-16 ENCOUNTER — Emergency Department (HOSPITAL_BASED_OUTPATIENT_CLINIC_OR_DEPARTMENT_OTHER): Payer: 59

## 2023-01-16 ENCOUNTER — Other Ambulatory Visit: Payer: Self-pay

## 2023-01-16 ENCOUNTER — Encounter (HOSPITAL_BASED_OUTPATIENT_CLINIC_OR_DEPARTMENT_OTHER): Payer: Self-pay | Admitting: Emergency Medicine

## 2023-01-16 DIAGNOSIS — D638 Anemia in other chronic diseases classified elsewhere: Secondary | ICD-10-CM | POA: Diagnosis present

## 2023-01-16 DIAGNOSIS — I472 Ventricular tachycardia, unspecified: Secondary | ICD-10-CM | POA: Diagnosis not present

## 2023-01-16 DIAGNOSIS — Z681 Body mass index (BMI) 19 or less, adult: Secondary | ICD-10-CM | POA: Diagnosis not present

## 2023-01-16 DIAGNOSIS — Z1152 Encounter for screening for COVID-19: Secondary | ICD-10-CM

## 2023-01-16 DIAGNOSIS — I5043 Acute on chronic combined systolic (congestive) and diastolic (congestive) heart failure: Secondary | ICD-10-CM | POA: Diagnosis not present

## 2023-01-16 DIAGNOSIS — A419 Sepsis, unspecified organism: Principal | ICD-10-CM | POA: Diagnosis present

## 2023-01-16 DIAGNOSIS — Z87891 Personal history of nicotine dependence: Secondary | ICD-10-CM

## 2023-01-16 DIAGNOSIS — I2510 Atherosclerotic heart disease of native coronary artery without angina pectoris: Secondary | ICD-10-CM | POA: Diagnosis present

## 2023-01-16 DIAGNOSIS — I462 Cardiac arrest due to underlying cardiac condition: Secondary | ICD-10-CM | POA: Diagnosis not present

## 2023-01-16 DIAGNOSIS — Z923 Personal history of irradiation: Secondary | ICD-10-CM

## 2023-01-16 DIAGNOSIS — E43 Unspecified severe protein-calorie malnutrition: Secondary | ICD-10-CM | POA: Diagnosis present

## 2023-01-16 DIAGNOSIS — Z800 Family history of malignant neoplasm of digestive organs: Secondary | ICD-10-CM

## 2023-01-16 DIAGNOSIS — D849 Immunodeficiency, unspecified: Secondary | ICD-10-CM | POA: Diagnosis present

## 2023-01-16 DIAGNOSIS — I110 Hypertensive heart disease with heart failure: Secondary | ICD-10-CM | POA: Diagnosis present

## 2023-01-16 DIAGNOSIS — E872 Acidosis, unspecified: Secondary | ICD-10-CM | POA: Diagnosis present

## 2023-01-16 DIAGNOSIS — Z7901 Long term (current) use of anticoagulants: Secondary | ICD-10-CM

## 2023-01-16 DIAGNOSIS — E871 Hypo-osmolality and hyponatremia: Secondary | ICD-10-CM | POA: Diagnosis present

## 2023-01-16 DIAGNOSIS — C3431 Malignant neoplasm of lower lobe, right bronchus or lung: Secondary | ICD-10-CM | POA: Diagnosis not present

## 2023-01-16 DIAGNOSIS — L89620 Pressure ulcer of left heel, unstageable: Secondary | ICD-10-CM | POA: Diagnosis present

## 2023-01-16 DIAGNOSIS — J690 Pneumonitis due to inhalation of food and vomit: Secondary | ICD-10-CM

## 2023-01-16 DIAGNOSIS — I5020 Unspecified systolic (congestive) heart failure: Secondary | ICD-10-CM

## 2023-01-16 DIAGNOSIS — S32302D Unspecified fracture of left ilium, subsequent encounter for fracture with routine healing: Secondary | ICD-10-CM

## 2023-01-16 DIAGNOSIS — Z7984 Long term (current) use of oral hypoglycemic drugs: Secondary | ICD-10-CM

## 2023-01-16 DIAGNOSIS — M96A3 Multiple fractures of ribs associated with chest compression and cardiopulmonary resuscitation: Secondary | ICD-10-CM | POA: Diagnosis not present

## 2023-01-16 DIAGNOSIS — E785 Hyperlipidemia, unspecified: Secondary | ICD-10-CM | POA: Diagnosis present

## 2023-01-16 DIAGNOSIS — R34 Anuria and oliguria: Secondary | ICD-10-CM | POA: Diagnosis not present

## 2023-01-16 DIAGNOSIS — J189 Pneumonia, unspecified organism: Secondary | ICD-10-CM | POA: Diagnosis present

## 2023-01-16 DIAGNOSIS — J440 Chronic obstructive pulmonary disease with acute lower respiratory infection: Secondary | ICD-10-CM | POA: Diagnosis present

## 2023-01-16 DIAGNOSIS — C7951 Secondary malignant neoplasm of bone: Secondary | ICD-10-CM | POA: Diagnosis present

## 2023-01-16 DIAGNOSIS — Z86718 Personal history of other venous thrombosis and embolism: Secondary | ICD-10-CM

## 2023-01-16 DIAGNOSIS — I959 Hypotension, unspecified: Secondary | ICD-10-CM | POA: Diagnosis not present

## 2023-01-16 DIAGNOSIS — R9431 Abnormal electrocardiogram [ECG] [EKG]: Secondary | ICD-10-CM | POA: Diagnosis not present

## 2023-01-16 DIAGNOSIS — D689 Coagulation defect, unspecified: Secondary | ICD-10-CM | POA: Diagnosis present

## 2023-01-16 DIAGNOSIS — I4901 Ventricular fibrillation: Secondary | ICD-10-CM | POA: Diagnosis not present

## 2023-01-16 DIAGNOSIS — R54 Age-related physical debility: Secondary | ICD-10-CM | POA: Diagnosis present

## 2023-01-16 DIAGNOSIS — R64 Cachexia: Secondary | ICD-10-CM | POA: Diagnosis present

## 2023-01-16 DIAGNOSIS — M1990 Unspecified osteoarthritis, unspecified site: Secondary | ICD-10-CM | POA: Diagnosis present

## 2023-01-16 DIAGNOSIS — E876 Hypokalemia: Secondary | ICD-10-CM | POA: Diagnosis present

## 2023-01-16 DIAGNOSIS — R296 Repeated falls: Secondary | ICD-10-CM | POA: Diagnosis present

## 2023-01-16 DIAGNOSIS — I493 Ventricular premature depolarization: Secondary | ICD-10-CM | POA: Diagnosis present

## 2023-01-16 DIAGNOSIS — Z7951 Long term (current) use of inhaled steroids: Secondary | ICD-10-CM

## 2023-01-16 DIAGNOSIS — R6521 Severe sepsis with septic shock: Secondary | ICD-10-CM | POA: Diagnosis present

## 2023-01-16 DIAGNOSIS — I5031 Acute diastolic (congestive) heart failure: Secondary | ICD-10-CM | POA: Diagnosis not present

## 2023-01-16 DIAGNOSIS — Z5982 Transportation insecurity: Secondary | ICD-10-CM

## 2023-01-16 DIAGNOSIS — J9601 Acute respiratory failure with hypoxia: Secondary | ICD-10-CM

## 2023-01-16 DIAGNOSIS — I5041 Acute combined systolic (congestive) and diastolic (congestive) heart failure: Secondary | ICD-10-CM | POA: Diagnosis not present

## 2023-01-16 DIAGNOSIS — Z79899 Other long term (current) drug therapy: Secondary | ICD-10-CM

## 2023-01-16 DIAGNOSIS — I469 Cardiac arrest, cause unspecified: Secondary | ICD-10-CM | POA: Diagnosis present

## 2023-01-16 DIAGNOSIS — R0602 Shortness of breath: Secondary | ICD-10-CM | POA: Diagnosis present

## 2023-01-16 DIAGNOSIS — R579 Shock, unspecified: Secondary | ICD-10-CM | POA: Diagnosis not present

## 2023-01-16 DIAGNOSIS — Z7982 Long term (current) use of aspirin: Secondary | ICD-10-CM

## 2023-01-16 DIAGNOSIS — Z85118 Personal history of other malignant neoplasm of bronchus and lung: Secondary | ICD-10-CM

## 2023-01-16 DIAGNOSIS — D75839 Thrombocytosis, unspecified: Secondary | ICD-10-CM | POA: Diagnosis present

## 2023-01-16 DIAGNOSIS — Z809 Family history of malignant neoplasm, unspecified: Secondary | ICD-10-CM

## 2023-01-16 DIAGNOSIS — Z96642 Presence of left artificial hip joint: Secondary | ICD-10-CM | POA: Diagnosis present

## 2023-01-16 LAB — CBC WITH DIFFERENTIAL/PLATELET
Abs Immature Granulocytes: 0.01 10*3/uL (ref 0.00–0.07)
Basophils Absolute: 0 10*3/uL (ref 0.0–0.1)
Basophils Relative: 0 %
Eosinophils Absolute: 0 10*3/uL (ref 0.0–0.5)
Eosinophils Relative: 1 %
HCT: 27.8 % — ABNORMAL LOW (ref 36.0–46.0)
Hemoglobin: 8.4 g/dL — ABNORMAL LOW (ref 12.0–15.0)
Immature Granulocytes: 0 %
Lymphocytes Relative: 14 %
Lymphs Abs: 0.7 10*3/uL (ref 0.7–4.0)
MCH: 26.3 pg (ref 26.0–34.0)
MCHC: 30.2 g/dL (ref 30.0–36.0)
MCV: 87.1 fL (ref 80.0–100.0)
Monocytes Absolute: 0.4 10*3/uL (ref 0.1–1.0)
Monocytes Relative: 8 %
Neutro Abs: 4 10*3/uL (ref 1.7–7.7)
Neutrophils Relative %: 77 %
Platelets: 415 10*3/uL — ABNORMAL HIGH (ref 150–400)
RBC: 3.19 MIL/uL — ABNORMAL LOW (ref 3.87–5.11)
RDW: 16.2 % — ABNORMAL HIGH (ref 11.5–15.5)
WBC: 5.2 10*3/uL (ref 4.0–10.5)
nRBC: 0 % (ref 0.0–0.2)

## 2023-01-16 LAB — COMPREHENSIVE METABOLIC PANEL
ALT: 10 U/L (ref 0–44)
AST: 17 U/L (ref 15–41)
Albumin: 2.9 g/dL — ABNORMAL LOW (ref 3.5–5.0)
Alkaline Phosphatase: 165 U/L — ABNORMAL HIGH (ref 38–126)
Anion gap: 14 (ref 5–15)
BUN: 12 mg/dL (ref 8–23)
CO2: 27 mmol/L (ref 22–32)
Calcium: 8.6 mg/dL — ABNORMAL LOW (ref 8.9–10.3)
Chloride: 93 mmol/L — ABNORMAL LOW (ref 98–111)
Creatinine, Ser: 0.62 mg/dL (ref 0.44–1.00)
GFR, Estimated: >60 mL/min (ref >60)
Glucose, Bld: 106 mg/dL — ABNORMAL HIGH (ref 70–99)
Potassium: 2.8 mmol/L — ABNORMAL LOW (ref 3.5–5.1)
Sodium: 134 mmol/L — ABNORMAL LOW (ref 135–145)
Total Bilirubin: 0.5 mg/dL (ref 0.0–1.2)
Total Protein: 7 g/dL (ref 6.5–8.1)

## 2023-01-16 LAB — LACTIC ACID, PLASMA
Lactic Acid, Venous: 1.1 mmol/L (ref 0.5–1.9)
Lactic Acid, Venous: 0.9 mmol/L (ref 0.5–1.9)

## 2023-01-16 LAB — PROTIME-INR
INR: 1.1 (ref 0.8–1.2)
Prothrombin Time: 14.5 s (ref 11.4–15.2)

## 2023-01-16 LAB — MAGNESIUM: Magnesium: 1.8 mg/dL (ref 1.7–2.4)

## 2023-01-16 LAB — RESP PANEL BY RT-PCR (RSV, FLU A&B, COVID)  RVPGX2
Influenza A by PCR: NEGATIVE
Influenza B by PCR: NEGATIVE
Resp Syncytial Virus by PCR: NEGATIVE
SARS Coronavirus 2 by RT PCR: NEGATIVE

## 2023-01-16 MED ORDER — SODIUM CHLORIDE 0.9 % IV SOLN
2.0000 g | Freq: Once | INTRAVENOUS | Status: AC
  Administered 2023-01-16: 2 g via INTRAVENOUS
  Filled 2023-01-16: qty 20

## 2023-01-16 MED ORDER — ASPIRIN 325 MG PO TBEC: 325.0000 mg | DELAYED_RELEASE_TABLET | Freq: Every day | ORAL | Status: DC

## 2023-01-16 MED ORDER — POTASSIUM CHLORIDE CRYS ER 20 MEQ PO TBCR
40.0000 meq | EXTENDED_RELEASE_TABLET | ORAL | Status: AC
  Administered 2023-01-16: 40 meq via ORAL
  Filled 2023-01-16: qty 2

## 2023-01-16 MED ORDER — SODIUM CHLORIDE 0.9 % IV SOLN
2.0000 g | INTRAVENOUS | Status: AC
  Administered 2023-01-17 – 2023-01-21 (×5): 2 g via INTRAVENOUS
  Filled 2023-01-16 (×5): qty 20

## 2023-01-16 MED ORDER — ACETAMINOPHEN 325 MG PO TABS
650.0000 mg | ORAL_TABLET | Freq: Once | ORAL | Status: AC
  Administered 2023-01-16: 650 mg via ORAL
  Filled 2023-01-16: qty 2

## 2023-01-16 MED ORDER — IPRATROPIUM-ALBUTEROL 0.5-2.5 (3) MG/3ML IN SOLN
3.0000 mL | Freq: Once | RESPIRATORY_TRACT | Status: AC
  Administered 2023-01-16: 3 mL via RESPIRATORY_TRACT
  Filled 2023-01-16: qty 3

## 2023-01-16 MED ORDER — ENOXAPARIN SODIUM 30 MG/0.3ML IJ SOSY
30.0000 mg | PREFILLED_SYRINGE | INTRAMUSCULAR | Status: DC
  Filled 2023-01-16: qty 0.3

## 2023-01-16 MED ORDER — ACETAMINOPHEN 325 MG PO TABS
650.0000 mg | ORAL_TABLET | Freq: Four times a day (QID) | ORAL | Status: DC | PRN
  Administered 2023-01-23 – 2023-01-24 (×2): 650 mg via ORAL
  Filled 2023-01-16 (×2): qty 2

## 2023-01-16 MED ORDER — SODIUM CHLORIDE 0.9 % IV SOLN
500.0000 mg | INTRAVENOUS | Status: DC
  Filled 2023-01-16: qty 5

## 2023-01-16 MED ORDER — POTASSIUM CHLORIDE 20 MEQ PO PACK
40.0000 meq | PACK | ORAL | Status: AC
  Administered 2023-01-17 (×2): 40 meq via ORAL
  Filled 2023-01-16 (×3): qty 2

## 2023-01-16 MED ORDER — ACETAMINOPHEN 650 MG RE SUPP: 650.0000 mg | Freq: Four times a day (QID) | RECTAL | Status: DC | PRN

## 2023-01-16 MED ORDER — MONTELUKAST SODIUM 10 MG PO TABS
10.0000 mg | ORAL_TABLET | Freq: Every day | ORAL | Status: DC
  Filled 2023-01-16: qty 1

## 2023-01-16 MED ORDER — SODIUM CHLORIDE 0.9 % IV BOLUS
500.0000 mL | Freq: Once | INTRAVENOUS | Status: AC
  Administered 2023-01-16: 500 mL via INTRAVENOUS

## 2023-01-16 MED ORDER — SODIUM CHLORIDE 0.9 % IV SOLN
500.0000 mg | Freq: Once | INTRAVENOUS | Status: AC
  Administered 2023-01-16: 500 mg via INTRAVENOUS
  Filled 2023-01-16: qty 5

## 2023-01-16 MED ORDER — ACETAMINOPHEN 500 MG PO TABS
500.0000 mg | ORAL_TABLET | Freq: Once | ORAL | Status: AC
  Administered 2023-01-16: 500 mg via ORAL
  Filled 2023-01-16: qty 1

## 2023-01-16 MED ORDER — ALBUTEROL SULFATE (2.5 MG/3ML) 0.083% IN NEBU
2.5000 mg | INHALATION_SOLUTION | Freq: Once | RESPIRATORY_TRACT | Status: AC
  Administered 2023-01-16: 2.5 mg via RESPIRATORY_TRACT
  Filled 2023-01-16: qty 3

## 2023-01-16 MED ORDER — POLYETHYLENE GLYCOL 3350 17 G PO PACK: 17.0000 g | PACK | Freq: Every day | ORAL | Status: DC | PRN

## 2023-01-16 MED ORDER — IOHEXOL 350 MG/ML SOLN
100.0000 mL | Freq: Once | INTRAVENOUS | Status: AC | PRN
  Administered 2023-01-17: 75 mL via INTRAVENOUS

## 2023-01-16 MED ORDER — IPRATROPIUM-ALBUTEROL 0.5-2.5 (3) MG/3ML IN SOLN
3.0000 mL | RESPIRATORY_TRACT | Status: DC
  Administered 2023-01-16: 3 mL via RESPIRATORY_TRACT
  Filled 2023-01-16: qty 3

## 2023-01-16 MED ORDER — POTASSIUM CHLORIDE 10 MEQ/100ML IV SOLN
10.0000 meq | INTRAVENOUS | Status: AC
  Administered 2023-01-16: 10 meq via INTRAVENOUS
  Filled 2023-01-16: qty 100

## 2023-01-16 MED ORDER — MOMETASONE FURO-FORMOTEROL FUM 200-5 MCG/ACT IN AERO
2.0000 | INHALATION_SPRAY | Freq: Two times a day (BID) | RESPIRATORY_TRACT | Status: DC
  Filled 2023-01-16: qty 8.8

## 2023-01-16 MED ORDER — SODIUM CHLORIDE 0.9% FLUSH
3.0000 mL | Freq: Two times a day (BID) | INTRAVENOUS | Status: DC
  Administered 2023-01-17 – 2023-01-27 (×20): 3 mL via INTRAVENOUS

## 2023-01-16 NOTE — ED Triage Notes (Signed)
SOB with productive cough x 3 days.  Pt also c/o back pain and headache.  Unknown fever

## 2023-01-16 NOTE — Plan of Care (Signed)
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  In ED, tachycardic to 120s and tachypneic to 28.  Saturating at 100% on RA.  BP elevated.  Na 134.  K2.8.  ALP 168.  Lactic acid negative x 2.  Hgb 8.4.  COVID-19, influenza and RSV PCR nonreactive.  CXR raises concern for RLL infiltrate and possible parapneumonic effusion.  Received ceftriaxone, Zithromax, 500 cc bolus and DuoNeb x 2.  Admission requested for pneumonia.  Recommended CT angio chest to rule out PE which could also give better understanding of RLL infiltrate/effusion.  EDP to order this and follow-up on the result.  She may benefit from steroid as well.

## 2023-01-16 NOTE — Assessment & Plan Note (Addendum)
Patient presents with cough and expectoration and sensation of shortness of breath for approximately 3 days.  However no fever or leukocytosis currently noted.  Patient is indeed immunocompromised on account of her malignancy.  Blood culture is pending.  Patient is s/p ceftriaxone and azithromycin.  We will continue with ceftriaxone and azithromycin.  Viral panel in the ER is negative.  Chest x-ray concerning for effusion on the right.  Given recent falls question hemothorax versus parapneumonic effusion versus malignant effusion.  Therefore we will get a CT chest without contrast at this time.  Patient currently not tachycardic no leg swelling.  To suggest venous thromboembolism at this juncture.  Please note patient is supposed to be chronically anticoagulated for her right IJ thrombus. (But is not taking her meds). Given that concern for parapneumonic effusion, I will hold off on restarting apixaban this evening till at least we review the CT chest.

## 2023-01-16 NOTE — Assessment & Plan Note (Signed)
Appears to be severe.  Unfortunately prior KCl replacement orders seem to have expired.  I will check the KCl level right now and give 40 mEq p.o. every 4 hourly for 2 doses.

## 2023-01-16 NOTE — H&P (Addendum)
History and Physical    Patient: Anna Finley ONG:[id number] DOB: [birth year] DOA: 01/16/2023 DOS: the patient was seen and examined on 01/16/2023 PCP: [name], MD  Patient coming from: Home>[facility] ER > [facility]  Chief Complaint:  Chief Complaint  Patient presents with   Shortness of Breath   HPI: Anna Finley is a 77 y.o. 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  S/p ORIF 12/16/2022 on the left side.  It seems patient has continued to have falls at home.  Patient has a ER visit on December 21, 2022 after patient had a fall at home.  At the time a CAT scan with pulmonary embolism protocol was performed without any finding of effusion or pulmonary embolism.  Patient seems to have had acute displaced oblique fracture of the left iliac crest at the time.  Patient was discharged home.  Patient currently returns to our [facility] ER site with complaints of 3 days of coughing and sensation of shortness of breath when she exerts such as when walking with the walker.  She does not endorse any fever palpitation loss of consciousness leg swelling or cramping.  Her only chest pain is when she coughs, otherwise she does not have any chest pain.   On presentation to the ER she was found to be tachycardic and tachypneic to 28/min as well as saturating 100% on room air however she has been placed on 2 L/min of supplementary oxygen for comfort.  No leukocytosis and fever is documented.  Right sided infiltrate on chest x-ray with possible effusion is noted.  Patient is s/p ceftriaxone and azithromycin.  Transferred to [facility] for [facility].  Review of Systems: As mentioned in the history of present illness. All other systems reviewed and are negative. Past Medical History:  Diagnosis Date   COPD (chronic obstructive pulmonary disease) with  chronic bronchitis (HCC)    HTN (hypertension)    Pneumothorax    Past Surgical History:  Procedure Laterality Date   BRONCHIAL NEEDLE ASPIRATION BIOPSY  04/20/2021   Procedure: BRONCHIAL NEEDLE ASPIRATION BIOPSIES;  Surgeon: Lorin Glass, MD;  Location: [facility];  Service: Endoscopy;;   BRONCHIAL WASHINGS  04/20/2021   Procedure: BRONCHIAL WASHINGS;  Surgeon: Lorin Glass, MD;  Location: [facility];  Service: Endoscopy;;   ENDOBRONCHIAL ULTRASOUND N/A 04/20/2021   Procedure: ENDOBRONCHIAL ULTRASOUND;  Surgeon: Lorin Glass, MD;  Location[facility];  Service: Endoscopy;  Laterality: N/A;   HIP ARTHROPLASTY     VIDEO BRONCHOSCOPY Left 04/20/2021   Procedure: VIDEO BRONCHOSCOPY WITH FLUORO;  Surgeon: Lorin Glass, MD;  [facility];  Service: Endoscopy;  Laterality: Left;  EBUS   Social History:  reports that she quit smoking about 12 years ago. Her smoking use included cigarettes. She started smoking about 65 years ago. She has a 26.5 pack-year smoking history. She quit smokeless tobacco use about 12 years ago. She reports that she does not currently use alcohol. She reports that she does not use drugs.  No Known Allergies  Family History  Problem Relation Age of Onset   Cancer Father 55       stomach    Prior to Admission medications   Medication Sig Start Date End Date Taking? Authorizing Provider  acetaminophen (TYLENOL) 500 MG tablet Take 500 mg by mouth in the morning, at noon, and at bedtime.  Yes [provider]  aspirin EC 325 MG tablet Take 325 mg by mouth daily.   Yes [provider]  atenolol-chlorthalidone (TENORETIC) 50-25 MG tablet Take 1 tablet by mouth daily. 05/08/21  Yes [provider]  montelukast (SINGULAIR) 10 MG tablet Take 10 mg by mouth at bedtime. 03/23/21  Yes [provider]  osimertinib mesylate (TAGRISSO) 80 MG tablet Take 1 tablet (80 mg total) by mouth daily. 12/19/22  Yes Heilingoetter, Cassandra  L, PA-C  WIXELA INHUB 250-50 MCG/ACT AEPB Inhale 1 puff into the lungs 2 (two) times daily. 03/23/21  Yes [provider]  apixaban (ELIQUIS) 5 MG TABS tablet TAKE 1 TABLET(5 MG) BY MOUTH TWICE DAILY. FOLLOW UP WITH DISCARD REMAINDER. MOHAMED FOR ADDITIONAL REFILLS Patient not taking: Reported on 01/16/2023 05/21/22   Heilingoetter, Cassandra L, PA-C  atenolol (TENORMIN) 50 MG tablet Take 1 tablet (50 mg total) by mouth daily. Patient not taking: Reported on 01/16/2023 04/28/21 01/16/23  Zigmund Daniel., MD  polyethylene glycol (MIRALAX / GLYCOLAX) 17 g packet Take 17 g by mouth daily as needed for mild constipation. Meds to bed, thanks Patient not taking: Reported on 01/16/2023 04/27/21   Zigmund Daniel., MD  potassium chloride SA (KLOR-CON M) 20 MEQ tablet Take 1 tablet (20 mEq total) by mouth daily. Patient not taking: Reported on 01/16/2023 03/21/22   Si Gaul, MD    Physical Exam: Vitals:   01/16/23 1330 01/16/23 1620 01/16/23 1744 01/16/23 2055  BP:  (!) 146/80 136/74   Pulse: (!) 126 (!) 118 76   Resp:  18 20   Temp:  98.7 F (37.1 C) 98.7 F (37.1 C)   TempSrc:  Oral Oral   SpO2: 97% 98% 98% 98%  Weight:      Height:       General: Patient appears to be in no distress.  On 2 L/min of supplementary oxygen.  Patient gives a coherent account of her symptoms. Respiratory exam: Markedly diminished breath sounds in the right lower lung field with dullness to percussion.  Good air entry on the left side. Cardiovascular exam S1-S2 normal Tachycardia Abdomen soft nontender Extremities warm without edema with distal function intact. No rash identified Data Reviewed:  Labs on Admission:  Results for orders placed or performed during the hospital encounter of 01/16/23 (from the past 24 hours)  Comprehensive metabolic panel     Status: Abnormal   Collection Time: 01/16/23 10:28 AM  Result Value Ref Range   Sodium 134 (L) 135 - 145 mmol/L   Potassium 2.8 (L)  3.5 - 5.1 mmol/L   Chloride 93 (L) 98 - 111 mmol/L   CO2 27 22 - 32 mmol/L   Glucose, Bld 106 (H) 70 - 99 mg/dL   BUN 12 8 - 23 mg/dL   Creatinine, Ser 8.11 0.44 - 1.00 mg/dL   Calcium 8.6 (L) 8.9 - 10.3 mg/dL   Total Protein 7.0 6.5 - 8.1 g/dL   Albumin 2.9 (L) 3.5 - 5.0 g/dL   AST 17 15 - 41 U/L   ALT 10 0 - 44 U/L   Alkaline Phosphatase 165 (H) 38 - 126 U/L   Total Bilirubin 0.5 0.0 - 1.2 mg/dL   GFR, Estimated >91 >47 mL/min   Anion gap 14 5 - 15  Lactic acid, plasma     Status: None   Collection Time: 01/16/23 10:28 AM  Result Value Ref Range   Lactic Acid, Venous 1.1 0.5 - 1.9 mmol/L  CBC with Differential     Status: Abnormal   Collection Time: 01/16/23 10:28 AM  Result Value Ref Range   WBC 5.2 4.0 - 10.5 K/uL   RBC 3.19 (L) 3.87 - 5.11 MIL/uL   Hemoglobin 8.4 (L) 12.0 - 15.0 g/dL   HCT 13.0 (L) 86.5 - 78.4 %   MCV 87.1 80.0 - 100.0 fL   MCH 26.3 26.0 - 34.0 pg   MCHC 30.2 30.0 - 36.0 g/dL   RDW 69.6 (H) 29.5 - 28.4 %   Platelets 415 (H) 150 - 400 K/uL   nRBC 0.0 0.0 - 0.2 %   Neutrophils Relative % 77 %   Neutro Abs 4.0 1.7 - 7.7 K/uL   Lymphocytes Relative 14 %   Lymphs Abs 0.7 0.7 - 4.0 K/uL   Monocytes Relative 8 %   Monocytes Absolute 0.4 0.1 - 1.0 K/uL   Eosinophils Relative 1 %   Eosinophils Absolute 0.0 0.0 - 0.5 K/uL   Basophils Relative 0 %   Basophils Absolute 0.0 0.0 - 0.1 K/uL   Immature Granulocytes 0 %   Abs Immature Granulocytes 0.01 0.00 - 0.07 K/uL  Protime-INR     Status: None   Collection Time: 01/16/23 10:28 AM  Result Value Ref Range   Prothrombin Time 14.5 11.4 - 15.2 seconds   INR 1.1 0.8 - 1.2  Culture, blood (Routine x 2)     Status: None (Preliminary result)   Collection Time: 01/16/23 10:28 AM   Specimen: BLOOD LEFT ARM  Result Value Ref Range   Specimen Description      BLOOD LEFT ARM Performed at [facility], [address] [place], [place] [address]    Special Requests      BOTTLES DRAWN AEROBIC AND ANAEROBIC  Blood Culture results may not be optimal due to an inadequate volume of blood received in culture bottles Performed at [facility], [address] [place], [place] [address]    Culture PENDING    Report Status PENDING   Resp panel by RT-PCR (RSV, Flu A&B, Covid) Anterior Nasal Swab     Status: None   Collection Time: 01/16/23 11:43 AM   Specimen: Anterior Nasal Swab  Result Value Ref Range   SARS Coronavirus 2 by RT PCR NEGATIVE NEGATIVE   Influenza A by PCR NEGATIVE NEGATIVE   Influenza B by PCR NEGATIVE NEGATIVE   Resp Syncytial Virus by PCR NEGATIVE NEGATIVE  Lactic acid, plasma     Status: None   Collection Time: 01/16/23 12:58 PM  Result Value Ref Range   Lactic Acid, Venous 0.9 0.5 - 1.9 mmol/L  Magnesium     Status: None   Collection Time: 01/16/23  2:43 PM  Result Value Ref Range   Magnesium 1.8 1.7 - 2.4 mg/dL   Basic Metabolic Panel: Recent Labs  Lab 01/16/23 1028 01/16/23 1443  NA 134*  --   K 2.8*  --   CL 93*  --   CO2 27  --   GLUCOSE 106*  --   BUN 12  --   CREATININE 0.62  --   CALCIUM 8.6*  --   MG  --  1.8   Liver Function Tests: Recent Labs  Lab 01/16/23 1028  AST 17  ALT 10  ALKPHOS 165*  BILITOT 0.5  PROT 7.0  ALBUMIN 2.9*   No results for input(s): "LIPASE", "AMYLASE" in the last 168 hours. No results for input(s): "AMMONIA" in the last  168 hours. CBC: Recent Labs  Lab 01/16/23 1028  WBC 5.2  NEUTROABS 4.0  HGB 8.4*  HCT 27.8*  MCV 87.1  PLT 415*   Cardiac Enzymes: No results for input(s): "CKTOTAL", "CKMB", "CKMBINDEX", "TROPONINIHS" in the last 168 hours.  BNP (last 3 results) No results for input(s): "PROBNP" in the last 8760 hours. CBG: No results for input(s): "GLUCAP" in the last 168 hours.  Radiological Exams on Admission:  DG Chest Portable 1 View Result Date: 01/16/2023 CLINICAL DATA:  Shortness of breath. EXAM: PORTABLE CHEST 1 VIEW COMPARISON:  December 21, 2022. FINDINGS: Stable cardiomediastinal  silhouette. Mild central pulmonary vascular congestion is noted. Increased right basilar opacity is noted concerning for worsening atelectasis or pneumonia with associated effusion. Degenerative changes are seen involving both glenohumeral joints. IMPRESSION: Increased right basilar opacity is noted concerning for worsening atelectasis or pneumonia with associated pleural effusion. Electronically Signed   By: Lupita Raider M.D.   On: 01/16/2023 11:07      Assessment and Plan: * Sepsis due to pneumonia [facility]) Patient presents with cough and expectoration and sensation of shortness of breath for approximately 3 days.  However no fever or leukocytosis currently noted.  Patient is indeed immunocompromised on account of her malignancy.  Blood culture is pending.  Patient is s/p ceftriaxone and azithromycin.  We will continue with ceftriaxone and azithromycin.  Viral panel in the ER is negative.  Chest x-ray concerning for effusion on the right.  Given recent falls question hemothorax versus parapneumonic effusion versus malignant effusion.  Therefore we will get a CT chest without contrast at this time.  Patient currently not tachycardic no leg swelling.  To suggest venous thromboembolism at this juncture.  Please note patient is supposed to be chronically anticoagulated for her right IJ thrombus. (But is not taking her meds). Given that concern for parapneumonic effusion, I will hold off on restarting apixaban this evening till at least we review the CT chest.  Malignant neoplasm of lower lobe of right lung [facility]) Per note on August 23, 2022, patient seems to have missed her October appointment with the oncology service.  I will send a message to Dr. Arbutus Ped to help out in this regard. Please follow up with him as appropirate:   Stage IV (T3, N2, M1 C) non-small cell lung cancer, adenocarcinoma presented with large right lower lobe lung mass in addition to subcarinal lymphadenopathy as well as right adrenal  gland and bone metastasis diagnosed in April 2023.   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   PD-L1 expression of 50%. The patient started her treatment with Tagrisso in May 2023.  She is status post 15 months of treatment. The patient has been tolerating this treatment well with no concerning adverse effects. I recommended for her to continue her current treatment with Tagrisso with the same dose. I will see her back for follow-up visit in 2 months for evaluation with repeat CT scan of the chest, abdomen and pelvis for restaging of her disease.  Hypokalemia Appears to be severe.  Unfortunately prior KCl replacement orders seem to have expired.  I will check the KCl level right now and give 40 mEq p.o. every 4 hourly for 2 doses.   Patient is basically not taking most of her home meds. May need social service input.    Advance Care Planning:   Code Status: Full Code per patient.  Consults: sent a message to Dr. Shirline Frees  as above inquiring about what to do with Tagrisso inpatient. Please follow up with him in AM  Family Communication: per patient.   Severity of Illness: The appropriate patient status for this patient is INPATIENT. Inpatient status is judged to be reasonable and necessary in order to provide the required intensity of service to ensure the patient's safety. The patient's presenting symptoms, physical exam findings, and initial radiographic and laboratory data in the context of their chronic comorbidities is felt to place them at high risk for further clinical deterioration. Furthermore, it is not anticipated that the patient will be medically stable for discharge from the hospital within 2 midnights of admission.   * I certify that at the point of admission it is my clinical judgment that the patient will require inpatient hospital care spanning beyond 2 midnights from the point of admission due to high intensity of  service, high risk for further deterioration and high frequency of surveillance required.*  Author: Nolberto Hanlon, MD 01/16/2023 10:18 PM  For on call review [email or web address].

## 2023-01-16 NOTE — ED Notes (Signed)
RT Note: Patient was given a breathing treatment due to expiratory wheezes through out . Patient states the treatment made her breath easier

## 2023-01-16 NOTE — ED Notes (Signed)
RN unsuccessful in [place] IV x 3. Provider made aware.

## 2023-01-16 NOTE — ED Provider Notes (Signed)
[facility] Provider Note   CSN: [id number] Arrival date & time: 01/16/23  1007     History  Chief Complaint  Patient presents with   Shortness of Breath    Anna Finley is a 77 y.o. female.   Shortness of Breath Patient presents shortness of breath and cough.  Has had around 3 days.  Has sputum production.  History of lung cancer.  Also did have relatively recent hip surgery.  It was complicated by dislocations and fall.  Reviewing notes from Ortho and outside ER.  Earlier this month had a visit to the ER for atypical chest pain with negative CTA at that time.  Patient reportedly sleeps with her grandchild who has had a cough with sputum production.  Reportedly cannot lay down due to the shortness of breath.  History of COPD.    Past Medical History:  Diagnosis Date   COPD (chronic obstructive pulmonary disease) with chronic bronchitis (HCC)    HTN (hypertension)    Pneumothorax    Past Surgical History:  Procedure Laterality Date   BRONCHIAL NEEDLE ASPIRATION BIOPSY  04/20/2021   Procedure: BRONCHIAL NEEDLE ASPIRATION BIOPSIES;  Surgeon: Lorin Glass, MD;  Location[facility];  Service: Endoscopy;;   BRONCHIAL WASHINGS  04/20/2021   Procedure: BRONCHIAL WASHINGS;  Surgeon: Lorin Glass, MD;  Location: [facility];  Service: Endoscopy;;   ENDOBRONCHIAL ULTRASOUND N/A 04/20/2021   Procedure: ENDOBRONCHIAL ULTRASOUND;  Surgeon: Lorin Glass, MD;  Location[facility];  Service: Endoscopy;  Laterality: N/A;   HIP ARTHROPLASTY     VIDEO BRONCHOSCOPY Left 04/20/2021   Procedure: VIDEO BRONCHOSCOPY WITH FLUORO;  Surgeon: Lorin Glass, MD;  Location[facility];  Service: Endoscopy;  Laterality: Left;  EBUS     Home Medications Prior to Admission medications   Medication Sig Start Date End Date Taking? Authorizing Provider  acetaminophen (TYLENOL) 500 MG tablet Take 500 mg by mouth every 6 (six) hours as needed for  moderate pain or headache.    [provider]  apixaban (ELIQUIS) 5 MG TABS tablet TAKE 1 TABLET(5 MG) BY MOUTH TWICE DAILY. FOLLOW UP WITH DISCARD REMAINDER. MOHAMED FOR ADDITIONAL REFILLS 05/21/22   Heilingoetter, Cassandra L, PA-C  atenolol (TENORMIN) 50 MG tablet Take 1 tablet (50 mg total) by mouth daily. 04/28/21 05/28/21  Zigmund Daniel., MD  atenolol-chlorthalidone (TENORETIC) 50-25 MG tablet Take 1 tablet by mouth daily. 05/08/21   [provider]  atorvastatin (LIPITOR) 40 MG tablet Take 40 mg by mouth daily. 03/23/21   [provider]  gabapentin (NEURONTIN) 300 MG capsule Take 300 mg by mouth 2 (two) times daily. 03/23/21   [provider]  metFORMIN (GLUCOPHAGE) 500 MG tablet Take 500 mg by mouth daily. 03/21/21   [provider]  montelukast (SINGULAIR) 10 MG tablet Take 10 mg by mouth at bedtime. 03/23/21   [provider]  osimertinib mesylate (TAGRISSO) 80 MG tablet Take 1 tablet (80 mg total) by mouth daily. 12/19/22   Heilingoetter, Cassandra L, PA-C  polyethylene glycol (MIRALAX / GLYCOLAX) 17 g packet Take 17 g by mouth daily as needed for mild constipation. Meds to bed, thanks 04/27/21   Zigmund Daniel., MD  potassium chloride SA (KLOR-CON M) 20 MEQ tablet Take 1 tablet (20 mEq total) by mouth daily. 03/21/22   Si Gaul, MD  promethazine-dextromethorphan (PROMETHAZINE-DM) 6.25-15 MG/5ML syrup Take 5 mLs by mouth 4 (four) times daily as needed for cough.  03/29/21   [provider]  Monte Fantasia INHUB 250-50 MCG/ACT AEPB 1 puff 2 (two) times daily. 03/23/21   [provider]      Allergies    Patient has no known allergies.    Review of Systems   Review of Systems  Respiratory:  Positive for shortness of breath.     Physical Exam Updated Vital Signs BP (!) 146/84 (BP Location: Right Arm)   Pulse (!) 101   Temp 98.7 F (37.1 C) (Oral)   Resp 20   Ht 5\' 5"  (1.651 m)   Wt 43.1 kg   SpO2 97%   BMI  15.81 kg/m  Physical Exam Vitals reviewed.  Cardiovascular:     Rate and Rhythm: Tachycardia present.  Pulmonary:     Comments: Diffuse harsh breath sounds.  Somewhat wet cough. Abdominal:     Tenderness: There is no abdominal tenderness.  Musculoskeletal:     Right lower leg: No edema.     Left lower leg: No edema.  Skin:    Capillary Refill: Capillary refill takes less than 2 seconds.  Neurological:     Mental Status: She is alert.     ED Results / Procedures / Treatments   Labs (all labs ordered are listed, but only abnormal results are displayed) Labs Reviewed  COMPREHENSIVE METABOLIC PANEL - Abnormal; Notable for the following components:      Result Value   Sodium 134 (*)    Potassium 2.8 (*)    Chloride 93 (*)    Glucose, Bld 106 (*)    Calcium 8.6 (*)    Albumin 2.9 (*)    Alkaline Phosphatase 165 (*)    All other components within normal limits  CBC WITH DIFFERENTIAL/PLATELET - Abnormal; Notable for the following components:   RBC 3.19 (*)    Hemoglobin 8.4 (*)    HCT 27.8 (*)    RDW 16.2 (*)    Platelets 415 (*)    All other components within normal limits  RESP PANEL BY RT-PCR (RSV, FLU A&B, COVID)  [id number]  CULTURE, BLOOD (ROUTINE X 2)  CULTURE, BLOOD (ROUTINE X 2)  LACTIC ACID, PLASMA  LACTIC ACID, PLASMA  PROTIME-INR  URINALYSIS, W/ REFLEX TO CULTURE (INFECTION SUSPECTED)    EKG EKG Interpretation Date/Time:  Monday January 16 2023 10:25:07 EST Ventricular Rate:  125 PR Interval:  114 QRS Duration:  89 QT Interval:  367 QTC Calculation: 530 R Axis:   56  Text Interpretation: Sinus tachycardia Paired ventricular premature complexes Anterior infarct, old Prolonged QT interval Confirmed by Benjiman Core [phone]) on 01/16/2023 11:09:12 AM  Radiology DG Chest Portable 1 View Result Date: 01/16/2023 CLINICAL DATA:  Shortness of breath. EXAM: PORTABLE CHEST 1 VIEW COMPARISON:  December 21, 2022. FINDINGS: Stable cardiomediastinal silhouette.  Mild central pulmonary vascular congestion is noted. Increased right basilar opacity is noted concerning for worsening atelectasis or pneumonia with associated effusion. Degenerative changes are seen involving both glenohumeral joints. IMPRESSION: Increased right basilar opacity is noted concerning for worsening atelectasis or pneumonia with associated pleural effusion. Electronically Signed   By: Lupita Raider M.D.   On: 01/16/2023 11:07    Procedures Procedures    Medications Ordered in ED Medications  ipratropium-albuterol (DUONEB) 0.5-2.5 (3) MG/3ML nebulizer solution 3 mL (3 mLs Nebulization Given 01/16/23 1032)  cefTRIAXone (ROCEPHIN) 2 g in sodium chloride 0.9 % 100 mL IVPB (0 g Intravenous Stopped 01/16/23 1211)  azithromycin (ZITHROMAX) 500 mg in sodium chloride 0.9 % 250 mL  IVPB (0 mg Intravenous Stopped 01/16/23 1302)  albuterol (PROVENTIL) (2.5 MG/3ML) 0.083% nebulizer solution 2.5 mg (2.5 mg Nebulization Given 01/16/23 1306)    ED Course/ Medical Decision Making/ A&P                                 Medical Decision Making Amount and/or Complexity of Data Reviewed Labs: ordered. Radiology: ordered.  Risk Prescription drug management. Decision regarding hospitalization.   Patient with cough URI symptoms along with sputum production.  Differential diagnosis long but includes cause such as viral infection and pneumonia.  Will get x-ray.  Will get blood work.  Breathing treatment being given.  White count reassuring.  Hemoglobin mildly decreased from prior 3 weeks ago was 10.4.  Denies black stool.  CMP does show potassium of 2.8 sodium 137.  Albumin is down to 2.9 also.  Respiratory panel negative.  Initial lactic acid normal.  However chest x-ray does show likely effusion with potential pneumonia on the right side.  With cough and sputum production I think this is likely the cause of her symptoms.  Does have a tachycardia.  Pulmonary embolism felt less likely with cough  with sputum production.  Antibiotics given.  However with continued tachycardia will admit to hospital.  Has had recent admissions to [facility] for hip fracture, however patient's oncologist is at the [facility] and they would like to go to [facility].  Will discuss with hospitalist.  Discussed with Dr. Alanda Slim from hospitalist service.  With continued tachycardia he requests CT angiography.  Have ordered.  We will follow results but admission order will be put in.   CRITICAL CARE Performed by: Benjiman Core Total critical care time: 30 minutes Critical care time was exclusive of separately billable procedures and treating other patients. Critical care was necessary to treat or prevent imminent or life-threatening deterioration. Critical care was time spent personally by me on the following activities: development of treatment plan with patient and/or surrogate as well as nursing, discussions with consultants, evaluation of patient's response to treatment, examination of patient, obtaining history from patient or surrogate, ordering and performing treatments and interventions, ordering and review of laboratory studies, ordering and review of radiographic studies, pulse oximetry and re-evaluation of patient's condition.           Final Clinical Impression(s) / ED Diagnoses Final diagnoses:  Community acquired pneumonia of right lung, unspecified part of lung    Rx / DC Orders ED Discharge Orders     None         Benjiman Core, MD 01/16/23 1438

## 2023-01-16 NOTE — Assessment & Plan Note (Signed)
Per note on August 23, 2022, patient seems to have missed her October appointment with the oncology service.  I will send a message to Dr. Arbutus Ped to help out in this regard. Please follow up with him as appropirate:   Stage IV (T3, N2, M1 C) non-small cell lung cancer, adenocarcinoma presented with large right lower lobe lung mass in addition to subcarinal lymphadenopathy as well as right adrenal gland and bone metastasis diagnosed in April 2023.   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   PD-L1 expression of 50%. The patient started her treatment with Tagrisso in May 2023.  She is status post 15 months of treatment. The patient has been tolerating this treatment well with no concerning adverse effects. I recommended for her to continue her current treatment with Tagrisso with the same dose. I will see her back for follow-up visit in 2 months for evaluation with repeat CT scan of the chest, abdomen and pelvis for restaging of her disease.

## 2023-01-16 NOTE — ED Notes (Signed)
Patient requesting breathing treatment, Provider and RT made aware. No acute distress.

## 2023-01-17 ENCOUNTER — Inpatient Hospital Stay (HOSPITAL_COMMUNITY): Payer: 59

## 2023-01-17 ENCOUNTER — Encounter: Payer: Self-pay | Admitting: Internal Medicine

## 2023-01-17 DIAGNOSIS — R9431 Abnormal electrocardiogram [ECG] [EKG]: Secondary | ICD-10-CM

## 2023-01-17 DIAGNOSIS — I5031 Acute diastolic (congestive) heart failure: Secondary | ICD-10-CM

## 2023-01-17 DIAGNOSIS — I469 Cardiac arrest, cause unspecified: Secondary | ICD-10-CM

## 2023-01-17 DIAGNOSIS — J690 Pneumonitis due to inhalation of food and vomit: Secondary | ICD-10-CM

## 2023-01-17 DIAGNOSIS — J9601 Acute respiratory failure with hypoxia: Secondary | ICD-10-CM

## 2023-01-17 DIAGNOSIS — R579 Shock, unspecified: Secondary | ICD-10-CM

## 2023-01-17 LAB — GLUCOSE, CAPILLARY
Glucose-Capillary: 101 mg/dL — ABNORMAL HIGH (ref 70–99)
Glucose-Capillary: 109 mg/dL — ABNORMAL HIGH (ref 70–99)
Glucose-Capillary: 124 mg/dL — ABNORMAL HIGH (ref 70–99)
Glucose-Capillary: 128 mg/dL — ABNORMAL HIGH (ref 70–99)
Glucose-Capillary: 141 mg/dL — ABNORMAL HIGH (ref 70–99)
Glucose-Capillary: 157 mg/dL — ABNORMAL HIGH (ref 70–99)
Glucose-Capillary: 195 mg/dL — ABNORMAL HIGH (ref 70–99)
Glucose-Capillary: 94 mg/dL (ref 70–99)

## 2023-01-17 LAB — TROPONIN I (HIGH SENSITIVITY)
Troponin I (High Sensitivity): 81 ng/L — ABNORMAL HIGH (ref <18)
Troponin I (High Sensitivity): 125 ng/L (ref <18)
Troponin I (High Sensitivity): 104 ng/L (ref <18)
Troponin I (High Sensitivity): 101 ng/L (ref <18)

## 2023-01-17 LAB — BASIC METABOLIC PANEL
Anion gap: 11 (ref 5–15)
BUN: 16 mg/dL (ref 8–23)
CO2: 24 mmol/L (ref 22–32)
Calcium: 8.1 mg/dL — ABNORMAL LOW (ref 8.9–10.3)
Chloride: 99 mmol/L (ref 98–111)
Creatinine, Ser: 0.71 mg/dL (ref 0.44–1.00)
GFR, Estimated: >60 mL/min (ref >60)
Glucose, Bld: 134 mg/dL — ABNORMAL HIGH (ref 70–99)
Potassium: 4 mmol/L (ref 3.5–5.1)
Sodium: 134 mmol/L — ABNORMAL LOW (ref 135–145)

## 2023-01-17 LAB — LACTIC ACID, PLASMA
Lactic Acid, Venous: 4.9 mmol/L (ref 0.5–1.9)
Lactic Acid, Venous: 1.3 mmol/L (ref 0.5–1.9)
Lactic Acid, Venous: 1.1 mmol/L (ref 0.5–1.9)

## 2023-01-17 LAB — CBC
HCT: 27.7 % — ABNORMAL LOW (ref 36.0–46.0)
HCT: 26.9 % — ABNORMAL LOW (ref 36.0–46.0)
Hemoglobin: 7.8 g/dL — ABNORMAL LOW (ref 12.0–15.0)
Hemoglobin: 7.8 g/dL — ABNORMAL LOW (ref 12.0–15.0)
MCH: 26.6 pg (ref 26.0–34.0)
MCH: 26.5 pg (ref 26.0–34.0)
MCHC: 28.2 g/dL — ABNORMAL LOW (ref 30.0–36.0)
MCHC: 29 g/dL — ABNORMAL LOW (ref 30.0–36.0)
MCV: 94.5 fL (ref 80.0–100.0)
MCV: 91.5 fL (ref 80.0–100.0)
Platelets: 363 10*3/uL (ref 150–400)
Platelets: 356 10*3/uL (ref 150–400)
RBC: 2.93 MIL/uL — ABNORMAL LOW (ref 3.87–5.11)
RBC: 2.94 MIL/uL — ABNORMAL LOW (ref 3.87–5.11)
RDW: 16.7 % — ABNORMAL HIGH (ref 11.5–15.5)
RDW: 16.7 % — ABNORMAL HIGH (ref 11.5–15.5)
WBC: 7.9 10*3/uL (ref 4.0–10.5)
WBC: 9.1 10*3/uL (ref 4.0–10.5)
nRBC: 0 % (ref 0.0–0.2)
nRBC: 0 % (ref 0.0–0.2)

## 2023-01-17 LAB — BLOOD GAS, ARTERIAL
Acid-Base Excess: 4.8 mmol/L — ABNORMAL HIGH (ref 0.0–2.0)
Bicarbonate: 29.9 mmol/L — ABNORMAL HIGH (ref 20.0–28.0)
Drawn by: 34719
FIO2: 40 %
MECHVT: 450 mL
O2 Saturation: 99.8 %
PEEP: 5 cmH2O
Patient temperature: 36.2
RATE: 16 {breaths}/min
pCO2 arterial: 43 mm[Hg] (ref 32–48)
pH, Arterial: 7.44 (ref 7.35–7.45)
pO2, Arterial: 123 mm[Hg] — ABNORMAL HIGH (ref 83–108)

## 2023-01-17 LAB — COMPREHENSIVE METABOLIC PANEL
ALT: 54 U/L — ABNORMAL HIGH (ref 0–44)
ALT: 55 U/L — ABNORMAL HIGH (ref 0–44)
ALT: 42 U/L (ref 0–44)
AST: 165 U/L — ABNORMAL HIGH (ref 15–41)
AST: 171 U/L — ABNORMAL HIGH (ref 15–41)
AST: 77 U/L — ABNORMAL HIGH (ref 15–41)
Albumin: 2.5 g/dL — ABNORMAL LOW (ref 3.5–5.0)
Albumin: 2.5 g/dL — ABNORMAL LOW (ref 3.5–5.0)
Albumin: 2.3 g/dL — ABNORMAL LOW (ref 3.5–5.0)
Alkaline Phosphatase: 161 U/L — ABNORMAL HIGH (ref 38–126)
Alkaline Phosphatase: 159 U/L — ABNORMAL HIGH (ref 38–126)
Alkaline Phosphatase: 145 U/L — ABNORMAL HIGH (ref 38–126)
Anion gap: 16 — ABNORMAL HIGH (ref 5–15)
Anion gap: 11 (ref 5–15)
Anion gap: 9 (ref 5–15)
BUN: 13 mg/dL (ref 8–23)
BUN: 14 mg/dL (ref 8–23)
BUN: 17 mg/dL (ref 8–23)
CO2: 20 mmol/L — ABNORMAL LOW (ref 22–32)
CO2: 25 mmol/L (ref 22–32)
CO2: 25 mmol/L (ref 22–32)
Calcium: 7.8 mg/dL — ABNORMAL LOW (ref 8.9–10.3)
Calcium: 7.7 mg/dL — ABNORMAL LOW (ref 8.9–10.3)
Calcium: 8.1 mg/dL — ABNORMAL LOW (ref 8.9–10.3)
Chloride: 93 mmol/L — ABNORMAL LOW (ref 98–111)
Chloride: 93 mmol/L — ABNORMAL LOW (ref 98–111)
Chloride: 98 mmol/L (ref 98–111)
Creatinine, Ser: 0.86 mg/dL (ref 0.44–1.00)
Creatinine, Ser: 0.7 mg/dL (ref 0.44–1.00)
Creatinine, Ser: 0.77 mg/dL (ref 0.44–1.00)
GFR, Estimated: >60 mL/min (ref >60)
GFR, Estimated: >60 mL/min (ref >60)
GFR, Estimated: >60 mL/min (ref >60)
Glucose, Bld: 162 mg/dL — ABNORMAL HIGH (ref 70–99)
Glucose, Bld: 144 mg/dL — ABNORMAL HIGH (ref 70–99)
Glucose, Bld: 159 mg/dL — ABNORMAL HIGH (ref 70–99)
Potassium: 2.9 mmol/L — ABNORMAL LOW (ref 3.5–5.1)
Potassium: 3.8 mmol/L (ref 3.5–5.1)
Potassium: 3.8 mmol/L (ref 3.5–5.1)
Sodium: 129 mmol/L — ABNORMAL LOW (ref 135–145)
Sodium: 129 mmol/L — ABNORMAL LOW (ref 135–145)
Sodium: 132 mmol/L — ABNORMAL LOW (ref 135–145)
Total Bilirubin: 0.6 mg/dL (ref 0.0–1.2)
Total Bilirubin: 0.7 mg/dL (ref 0.0–1.2)
Total Bilirubin: 0.4 mg/dL (ref 0.0–1.2)
Total Protein: 6.1 g/dL — ABNORMAL LOW (ref 6.5–8.1)
Total Protein: 5.9 g/dL — ABNORMAL LOW (ref 6.5–8.1)
Total Protein: 5.7 g/dL — ABNORMAL LOW (ref 6.5–8.1)

## 2023-01-17 LAB — ECHOCARDIOGRAM COMPLETE
AR max vel: 1.88 cm2
AV Area VTI: 1.67 cm2
AV Area mean vel: 1.64 cm2
AV Mean grad: 3 mm[Hg]
AV Peak grad: 5 mm[Hg]
Ao pk vel: 1.12 m/s
Area-P 1/2: 4.96 cm2
Calc EF: 34.9 %
Height: 65 in
MV VTI: 1.61 cm2
S' Lateral: 3.8 cm
Single Plane A2C EF: 32.2 %
Single Plane A4C EF: 34.4 %
Weight: 1407.42 [oz_av]

## 2023-01-17 LAB — MAGNESIUM
Magnesium: 2.2 mg/dL (ref 1.7–2.4)
Magnesium: 2.7 mg/dL — ABNORMAL HIGH (ref 1.7–2.4)
Magnesium: 2.4 mg/dL (ref 1.7–2.4)
Magnesium: 2.3 mg/dL (ref 1.7–2.4)

## 2023-01-17 LAB — HEPARIN LEVEL (UNFRACTIONATED)
Heparin Unfractionated: 0.11 [IU]/mL — ABNORMAL LOW (ref 0.30–0.70)
Heparin Unfractionated: 0.17 [IU]/mL — ABNORMAL LOW (ref 0.30–0.70)

## 2023-01-17 LAB — PROTIME-INR
INR: 1.3 — ABNORMAL HIGH (ref 0.8–1.2)
Prothrombin Time: 16.4 s — ABNORMAL HIGH (ref 11.4–15.2)

## 2023-01-17 LAB — HEMOGLOBIN A1C
Hgb A1c MFr Bld: 5.4 % (ref 4.8–5.6)
Mean Plasma Glucose: 108 mg/dL

## 2023-01-17 LAB — T4, FREE: Free T4: 1.49 ng/dL — ABNORMAL HIGH (ref 0.61–1.12)

## 2023-01-17 LAB — MRSA NEXT GEN BY PCR, NASAL: MRSA by PCR Next Gen: NOT DETECTED

## 2023-01-17 LAB — APTT: aPTT: 31 s (ref 24–36)

## 2023-01-17 LAB — TSH: TSH: 1.236 u[IU]/mL (ref 0.350–4.500)

## 2023-01-17 LAB — BRAIN NATRIURETIC PEPTIDE: B Natriuretic Peptide: 1272.6 pg/mL — ABNORMAL HIGH (ref 0.0–100.0)

## 2023-01-17 LAB — PHOSPHORUS
Phosphorus: 3.3 mg/dL (ref 2.5–4.6)
Phosphorus: 3.6 mg/dL (ref 2.5–4.6)

## 2023-01-17 LAB — POTASSIUM: Potassium: 2.5 mmol/L — CL (ref 3.5–5.1)

## 2023-01-17 MED ORDER — PHENYLEPHRINE 80 MCG/ML (10ML) SYRINGE FOR IV PUSH (FOR BLOOD PRESSURE SUPPORT)
80.0000 ug | PREFILLED_SYRINGE | INTRAVENOUS | Status: DC | PRN
  Administered 2023-01-17: 200 ug via INTRAVENOUS

## 2023-01-17 MED ORDER — FENTANYL CITRATE PF 50 MCG/ML IJ SOSY
12.5000 ug | PREFILLED_SYRINGE | INTRAMUSCULAR | Status: AC | PRN
  Administered 2023-01-18 (×2): 12.5 ug via INTRAVENOUS
  Filled 2023-01-17 (×2): qty 1

## 2023-01-17 MED ORDER — DOXYCYCLINE HYCLATE 100 MG IV SOLR
100.0000 mg | Freq: Two times a day (BID) | INTRAVENOUS | Status: AC
  Administered 2023-01-17 – 2023-01-21 (×10): 100 mg via INTRAVENOUS
  Filled 2023-01-17 (×10): qty 100

## 2023-01-17 MED ORDER — ASPIRIN 325 MG PO TABS
325.0000 mg | ORAL_TABLET | Freq: Every day | ORAL | Status: DC
  Administered 2023-01-17 – 2023-01-21 (×5): 325 mg
  Filled 2023-01-17 (×5): qty 1

## 2023-01-17 MED ORDER — ROCURONIUM BROMIDE 10 MG/ML (PF) SYRINGE
PREFILLED_SYRINGE | INTRAVENOUS | Status: AC
  Filled 2023-01-17: qty 10

## 2023-01-17 MED ORDER — THIAMINE MONONITRATE 100 MG PO TABS
100.0000 mg | ORAL_TABLET | Freq: Every day | ORAL | Status: AC
  Administered 2023-01-17 – 2023-01-21 (×5): 100 mg
  Filled 2023-01-17 (×5): qty 1

## 2023-01-17 MED ORDER — ETOMIDATE 2 MG/ML IV SOLN
INTRAVENOUS | Status: AC
  Filled 2023-01-17: qty 20

## 2023-01-17 MED ORDER — ACETYLCYSTEINE 20 % IN SOLN
4.0000 mL | Freq: Four times a day (QID) | RESPIRATORY_TRACT | Status: DC
  Administered 2023-01-17 (×2): 4 mL via RESPIRATORY_TRACT
  Filled 2023-01-17 (×2): qty 4

## 2023-01-17 MED ORDER — POLYETHYLENE GLYCOL 3350 17 G PO PACK
17.0000 g | PACK | Freq: Every day | ORAL | Status: DC
  Administered 2023-01-17: 17 g via ORAL
  Filled 2023-01-17: qty 1

## 2023-01-17 MED ORDER — PHENYLEPHRINE 80 MCG/ML (10ML) SYRINGE FOR IV PUSH (FOR BLOOD PRESSURE SUPPORT)
PREFILLED_SYRINGE | INTRAVENOUS | Status: AC
  Administered 2023-01-17: 200 ug via INTRAVENOUS
  Filled 2023-01-17: qty 10

## 2023-01-17 MED ORDER — FENTANYL CITRATE PF 50 MCG/ML IJ SOSY
50.0000 ug | PREFILLED_SYRINGE | INTRAMUSCULAR | Status: DC | PRN
  Administered 2023-01-17 – 2023-01-21 (×24): 50 ug via INTRAVENOUS
  Filled 2023-01-17 (×25): qty 1

## 2023-01-17 MED ORDER — MAGNESIUM SULFATE 2 GM/50ML IV SOLN
2.0000 g | Freq: Once | INTRAVENOUS | Status: AC
  Administered 2023-01-17: 2 g via INTRAVENOUS
  Filled 2023-01-17: qty 50

## 2023-01-17 MED ORDER — IPRATROPIUM-ALBUTEROL 0.5-2.5 (3) MG/3ML IN SOLN
3.0000 mL | Freq: Four times a day (QID) | RESPIRATORY_TRACT | Status: DC
  Administered 2023-01-17 – 2023-01-19 (×9): 3 mL via RESPIRATORY_TRACT
  Filled 2023-01-17 (×9): qty 3

## 2023-01-17 MED ORDER — VANCOMYCIN HCL 1000 MG IV SOLR
750.0000 mg | Freq: Once | INTRAVENOUS | Status: AC
  Administered 2023-01-17: 750 mg via INTRAVENOUS
  Filled 2023-01-17: qty 15

## 2023-01-17 MED ORDER — VANCOMYCIN HCL 750 MG IV SOLR
750.0000 mg | Freq: Once | INTRAVENOUS | Status: DC
  Filled 2023-01-17: qty 15

## 2023-01-17 MED ORDER — HEPARIN (PORCINE) 25000 UT/250ML-% IV SOLN: 10.0000 [IU]/kg/h | INTRAVENOUS | Status: DC

## 2023-01-17 MED ORDER — SODIUM CHLORIDE 0.9 % IV SOLN: 250.0000 mL | INTRAVENOUS | Status: AC

## 2023-01-17 MED ORDER — POLYETHYLENE GLYCOL 3350 17 G PO PACK
17.0000 g | PACK | Freq: Every day | ORAL | Status: DC
  Administered 2023-01-18 – 2023-01-21 (×4): 17 g
  Filled 2023-01-17 (×5): qty 1

## 2023-01-17 MED ORDER — METHYLPREDNISOLONE SODIUM SUCC 40 MG IJ SOLR
40.0000 mg | Freq: Two times a day (BID) | INTRAMUSCULAR | Status: DC
  Administered 2023-01-17 – 2023-01-18 (×3): 40 mg via INTRAVENOUS
  Filled 2023-01-17 (×3): qty 1

## 2023-01-17 MED ORDER — VANCOMYCIN HCL 750 MG/150ML IV SOLN
750.0000 mg | Freq: Once | INTRAVENOUS | Status: DC
  Filled 2023-01-17: qty 150

## 2023-01-17 MED ORDER — ORAL CARE MOUTH RINSE: 15.0000 mL | OROMUCOSAL | Status: DC | PRN

## 2023-01-17 MED ORDER — IPRATROPIUM BROMIDE 0.02 % IN SOLN: 0.5000 mg | RESPIRATORY_TRACT | Status: DC | PRN

## 2023-01-17 MED ORDER — MIDAZOLAM HCL 2 MG/2ML IJ SOLN
4.0000 mg | Freq: Once | INTRAMUSCULAR | Status: DC
  Administered 2023-01-17: 4 mg via INTRAVENOUS
  Filled 2023-01-17: qty 4

## 2023-01-17 MED ORDER — SUCCINYLCHOLINE CHLORIDE 200 MG/10ML IV SOSY
PREFILLED_SYRINGE | INTRAVENOUS | Status: AC
  Filled 2023-01-17: qty 10

## 2023-01-17 MED ORDER — PERFLUTREN LIPID MICROSPHERE
1.0000 mL | INTRAVENOUS | Status: AC | PRN
  Administered 2023-01-17: 3 mL via INTRAVENOUS

## 2023-01-17 MED ORDER — ONDANSETRON HCL 4 MG/2ML IJ SOLN: 4.0000 mg | Freq: Four times a day (QID) | INTRAMUSCULAR | Status: DC | PRN

## 2023-01-17 MED ORDER — FAMOTIDINE IN NACL 20-0.9 MG/50ML-% IV SOLN
20.0000 mg | Freq: Two times a day (BID) | INTRAVENOUS | Status: DC
  Administered 2023-01-17 – 2023-01-23 (×14): 20 mg via INTRAVENOUS
  Filled 2023-01-17 (×14): qty 50

## 2023-01-17 MED ORDER — LACTATED RINGERS IV SOLN
INTRAVENOUS | Status: DC
  Filled 2023-01-17 (×2): qty 1000

## 2023-01-17 MED ORDER — PROPOFOL 1000 MG/100ML IV EMUL
5.0000 ug/kg/min | INTRAVENOUS | Status: DC
  Administered 2023-01-17 (×2): 75 ug/kg/min via INTRAVENOUS
  Administered 2023-01-17: 55 ug/kg/min via INTRAVENOUS
  Administered 2023-01-17: 5 ug/kg/min via INTRAVENOUS
  Administered 2023-01-18: 65 ug/kg/min via INTRAVENOUS
  Administered 2023-01-18: 75 ug/kg/min via INTRAVENOUS
  Administered 2023-01-19: 5 ug/kg/min via INTRAVENOUS
  Administered 2023-01-19: 50 ug/kg/min via INTRAVENOUS
  Administered 2023-01-20: 30 ug/kg/min via INTRAVENOUS
  Administered 2023-01-20: 50 ug/kg/min via INTRAVENOUS
  Administered 2023-01-21: 15 ug/kg/min via INTRAVENOUS
  Filled 2023-01-17 (×12): qty 100

## 2023-01-17 MED ORDER — CHLORHEXIDINE GLUCONATE CLOTH 2 % EX PADS
6.0000 | MEDICATED_PAD | Freq: Every day | CUTANEOUS | Status: DC
  Administered 2023-01-18 – 2023-01-24 (×8): 6 via TOPICAL

## 2023-01-17 MED ORDER — HEPARIN (PORCINE) 25000 UT/250ML-% IV SOLN
1000.0000 [IU]/h | INTRAVENOUS | Status: DC
Start: 2023-01-17 — End: 2023-01-20
  Administered 2023-01-17: 700 [IU]/h via INTRAVENOUS
  Administered 2023-01-18: 800 [IU]/h via INTRAVENOUS
  Administered 2023-01-19: 1000 [IU]/h via INTRAVENOUS
  Filled 2023-01-17 (×3): qty 250

## 2023-01-17 MED ORDER — MIDAZOLAM HCL 2 MG/2ML IJ SOLN: 1.0000 mg | INTRAMUSCULAR | Status: DC | PRN

## 2023-01-17 MED ORDER — MIDAZOLAM HCL 2 MG/2ML IJ SOLN
INTRAMUSCULAR | Status: AC
  Filled 2023-01-17: qty 2

## 2023-01-17 MED ORDER — ORAL CARE MOUTH RINSE
15.0000 mL | OROMUCOSAL | Status: DC
  Administered 2023-01-17 – 2023-01-21 (×58): 15 mL via OROMUCOSAL

## 2023-01-17 MED ORDER — IPRATROPIUM-ALBUTEROL 0.5-2.5 (3) MG/3ML IN SOLN: 3.0000 mL | Freq: Four times a day (QID) | RESPIRATORY_TRACT | Status: DC

## 2023-01-17 MED ORDER — VITAL AF 1.2 CAL PO LIQD
1000.0000 mL | ORAL | Status: DC
  Administered 2023-01-17 – 2023-01-21 (×5): 1000 mL

## 2023-01-17 MED ORDER — NOREPINEPHRINE 4 MG/250ML-% IV SOLN
0.0000 ug/min | INTRAVENOUS | Status: DC
  Administered 2023-01-17: 2 ug/min via INTRAVENOUS
  Filled 2023-01-17: qty 250

## 2023-01-17 MED ORDER — INSULIN ASPART 100 UNIT/ML IJ SOLN
0.0000 [IU] | INTRAMUSCULAR | Status: DC
  Administered 2023-01-17 – 2023-01-18 (×5): 1 [IU] via SUBCUTANEOUS
  Administered 2023-01-19: 2 [IU] via SUBCUTANEOUS
  Administered 2023-01-19 – 2023-01-25 (×13): 1 [IU] via SUBCUTANEOUS
  Administered 2023-01-26: 2 [IU] via SUBCUTANEOUS

## 2023-01-17 MED ORDER — FENTANYL CITRATE PF 50 MCG/ML IJ SOSY
PREFILLED_SYRINGE | INTRAMUSCULAR | Status: AC
  Filled 2023-01-17: qty 2

## 2023-01-17 NOTE — Consult Note (Signed)
 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 She was dx with RLL PNA and started on Zithromax  and Rocephin . She has hypoK and being replaced.  Today, she had resp arrest and code blue was called and she achieved ROSC after one min, intubated and moved to ICU. She quit smoking 2012, smoked 62 PY.   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 rate 16, height 5' 5 (1.651 m), weight 43.1 kg, SpO2 100%.    Vent Mode: PRVC FiO2 (%):  [40 %] 40 % Set Rate:  [16 bmp] 16 bmp Vt Set:  [450 mL] 450 mL PEEP:  [5 cmH20] 5 cmH20 Plateau Pressure:  [22 cmH20] 22 cmH20   Intake/Output Summary (Last 24 hours) at 01/17/2023 0124 Last data filed at 01/16/2023 1621 Gross per 24 hour  Intake 633.65 ml  Output --  Net 633.65 ml   Filed Weights   01/16/23 1018  Weight: 43.1 kg    Examination: General: intubated, ETT 7.5 @ 22 cm at the teeth after pulling the ETT after PCXR evaluation. SpO2 100% on PRVC 18/420/+5/40% HENT: PERL. No LNE or thyromegaly. No JVD Lungs: poor air entry bilaterally. Basal crackles, and exp wheezing Cardiovascular: NL S1/S2. No  m/g/r Abdomen: no distension or tenderness Extremities: no edema. 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  Best Practice (right click and Reselect all SmartList Selections daily)   Diet/type: tubefeeds DVT prophylaxis systemic heparin  Pressure ulcer(s): N/A GI prophylaxis: H2B Lines: N/A Foley:  Yes, and it is still needed Code Status:  full code Last date of multidisciplinary goals of care discussion []   Labs   CBC: Recent Labs  Lab 01/16/23 1028 01/17/23 0056  WBC 5.2 7.9  NEUTROABS 4.0  --  HGB 8.4* 7.8*  HCT 27.8* 27.7*  MCV 87.1 94.5  PLT 415* 363    Basic Metabolic Panel: Recent Labs  Lab 01/16/23 1028 01/16/23 1443 01/16/23 2356  NA 134*  --   --   K 2.8*  --  2.5*  CL 93*  --   --   CO2 27  --   --   GLUCOSE 106*  --   --   BUN 12  --   --   CREATININE 0.62  --   --   CALCIUM  8.6*  --   --   MG  --  1.8  --    GFR: Estimated Creatinine Clearance: 40.7 mL/min (by C-G formula based on SCr of 0.62 mg/dL). Recent Labs  Lab  01/16/23 1028 01/16/23 1258 01/17/23 0056  WBC 5.2  --  7.9  LATICACIDVEN 1.1 0.9  --     Liver Function Tests: Recent Labs  Lab 01/16/23 1028  AST 17  ALT 10  ALKPHOS 165*  BILITOT 0.5  PROT 7.0  ALBUMIN 2.9*   No results for input(s): LIPASE, AMYLASE in the last 168 hours. No results for input(s): AMMONIA in the last 168 hours.  ABG No results found for: PHART, PCO2ART, PO2ART, HCO3, TCO2, ACIDBASEDEF, O2SAT   Coagulation Profile: Recent Labs  Lab 01/16/23 1028  INR 1.1    Cardiac Enzymes: No results for input(s): CKTOTAL, CKMB, CKMBINDEX, TROPONINI in the last 168 hours.  HbA1C: Hgb A1c MFr Bld  Date/Time Value Ref Range Status  04/21/2021 05:00 PM 5.5 4.8 - 5.6 % Final    Comment:    (NOTE)         Prediabetes: 5.7 - 6.4         Diabetes: >6.4         Glycemic control for adults with diabetes: <7.0   04/19/2021 11:03 AM 5.5 4.8 - 5.6 % Final    Comment:    (NOTE)         Prediabetes: 5.7 - 6.4         Diabetes: >6.4         Glycemic control for adults with diabetes: <7.0     CBG: Recent Labs  Lab 01/17/23 0009  GLUCAP 94    Review of Systems:   Intubated   Past Medical History:  She,  has a past medical history of COPD (chronic obstructive pulmonary disease) with chronic bronchitis (HCC), HTN (hypertension), and Pneumothorax.   Surgical History:   Past Surgical History:  Procedure Laterality Date   BRONCHIAL NEEDLE ASPIRATION BIOPSY  04/20/2021   Procedure: BRONCHIAL NEEDLE ASPIRATION BIOPSIES;  Surgeon: Claudene Toribio BROCKS, MD;  Location: [facility];  Service: Endoscopy;;   BRONCHIAL WASHINGS  04/20/2021   Procedure: BRONCHIAL WASHINGS;  Surgeon: Claudene Toribio BROCKS, MD;  Location: [facility];  Service: Endoscopy;;   ENDOBRONCHIAL ULTRASOUND N/A 04/20/2021   Procedure: ENDOBRONCHIAL ULTRASOUND;  Surgeon: Claudene Toribio BROCKS, MD;  Location: [facility];  Service: Endoscopy;  Laterality: N/A;   HIP ARTHROPLASTY     VIDEO  BRONCHOSCOPY Left 04/20/2021   Procedure: VIDEO BRONCHOSCOPY WITH FLUORO;  Surgeon: Claudene Toribio BROCKS, MD;  Location: [facility];  Service: Endoscopy;  Laterality: Left;  EBUS     Social History:   reports that she quit smoking about 12 years ago. Her smoking use included cigarettes. She started smoking about 65 years ago. She has a 26.5 pack-year smoking history. She quit smokeless tobacco use about 12 years ago. She reports that  she does not currently use alcohol. She reports that she does not use drugs.   Family History:  Her family history includes Cancer (age of onset: 83) in her father.   Allergies No Known Allergies   Home Medications  Prior to Admission medications   Medication Sig Start Date End Date Taking? Authorizing Provider  acetaminophen  (TYLENOL ) 500 MG tablet Take 500 mg by mouth in the morning, at noon, and at bedtime.   Yes [provider]  aspirin  EC 325 MG tablet Take 325 mg by mouth daily.   Yes [provider]  atenolol -chlorthalidone  (TENORETIC ) 50-25 MG tablet Take 1 tablet by mouth daily. 05/08/21  Yes [provider]  montelukast  (SINGULAIR ) 10 MG tablet Take 10 mg by mouth at bedtime. 03/23/21  Yes [provider]  osimertinib  mesylate (TAGRISSO ) 80 MG tablet Take 1 tablet (80 mg total) by mouth daily. 12/19/22  Yes Heilingoetter, Cassandra L, PA-C  WIXELA INHUB 250-50 MCG/ACT AEPB Inhale 1 puff into the lungs 2 (two) times daily. 03/23/21  Yes [provider]  apixaban  (ELIQUIS ) 5 MG TABS tablet TAKE 1 TABLET(5 MG) BY MOUTH TWICE DAILY. FOLLOW UP WITH DISCARD REMAINDER. MOHAMED FOR ADDITIONAL REFILLS Patient not taking: Reported on 01/16/2023 05/21/22   Heilingoetter, Cassandra L, PA-C  atenolol  (TENORMIN ) 50 MG tablet Take 1 tablet (50 mg total) by mouth daily. Patient not taking: Reported on 01/16/2023 04/28/21 01/16/23  Perri DELENA Meliton Mickey., MD  polyethylene glycol (MIRALAX  / GLYCOLAX ) 17 g packet Take 17 g by mouth daily  as needed for mild constipation. Meds to bed, thanks Patient not taking: Reported on 01/16/2023 04/27/21   Perri DELENA Meliton Mickey., MD  potassium chloride  SA (KLOR-CON  M) 20 MEQ tablet Take 1 tablet (20 mEq total) by mouth daily. Patient not taking: Reported on 01/16/2023 03/21/22   Sherrod Sherrod, MD     Critical care time: 65 min

## 2023-01-17 NOTE — Progress Notes (Signed)
 Patient coughing, awakened to voice, following commands, nodding appropriately. Family notified and at bedside to observe. Educated patient and family on use of sedation medication. Increased propofol to assist with ventilator tolerance.

## 2023-01-17 NOTE — ED Provider Notes (Signed)
 Department of Emergency Medicine CODE BLUE CONSULTATION    Code Blue CONSULT NOTE  Chief Complaint: Cardiac arrest/unresponsive   Level V Caveat: Unresponsive  History of present illness: I was contacted by the hospital for a CODE BLUE cardiac arrest upstairs and presented to the patient's bedside.  Upon my arrival, patient receiving adequate CPR and had been administered multiple rounds of epi.  ROS: Unable to obtain, Level V caveat  Scheduled Meds:  acetylcysteine   4 mL Nebulization Q6H   aspirin  EC  325 mg Oral Daily   Chlorhexidine  Gluconate Cloth  6 each Topical Daily   etomidate        insulin  aspart  0-6 Units Subcutaneous Q4H   ipratropium-albuterol   3 mL Nebulization Q6H   methylPREDNISolone  (SOLU-MEDROL ) injection  40 mg Intravenous Q12H   mouth rinse  15 mL Mouth Rinse Q2H   polyethylene glycol  17 g Oral Daily   potassium chloride   40 mEq Oral Q4H   sodium chloride  flush  3 mL Intravenous Q12H   succinylcholine        Continuous Infusions:  sodium chloride      azithromycin      cefTRIAXone  (ROCEPHIN )  IV     famotidine  (PEPCID ) IV Stopped (01/17/23 0137)   heparin      lactated ringers  1,000 mL with potassium chloride  20 mEq infusion 75 mL/hr at 01/17/23 0239   norepinephrine  (LEVOPHED ) Adult infusion 4 mcg/min (01/17/23 0239)   propofol  (DIPRIVAN ) infusion 25 mcg/kg/min (01/17/23 0239)   vancomycin  265 mL/hr at 01/17/23 0239   PRN Meds:.acetaminophen  **OR** acetaminophen , etomidate , fentaNYL  (SUBLIMAZE ) injection, fentaNYL  (SUBLIMAZE ) injection, iohexol , ipratropium, ondansetron  (ZOFRAN ) IV, mouth rinse, phenylephrine , succinylcholine  Past Medical History:  Diagnosis Date   COPD (chronic obstructive pulmonary disease) with chronic bronchitis (HCC)    HTN (hypertension)    Pneumothorax    Past Surgical History:  Procedure Laterality Date   BRONCHIAL NEEDLE ASPIRATION BIOPSY  04/20/2021   Procedure: BRONCHIAL NEEDLE ASPIRATION BIOPSIES;  Surgeon: Claudene Toribio BROCKS, MD;  Location: [facility];  Service: Endoscopy;;   BRONCHIAL WASHINGS  04/20/2021   Procedure: BRONCHIAL WASHINGS;  Surgeon: Claudene Toribio BROCKS, MD;  Location: [facility];  Service: Endoscopy;;   ENDOBRONCHIAL ULTRASOUND N/A 04/20/2021   Procedure: ENDOBRONCHIAL ULTRASOUND;  Surgeon: Claudene Toribio BROCKS, MD;  Location: [facility];  Service: Endoscopy;  Laterality: N/A;   HIP ARTHROPLASTY     VIDEO BRONCHOSCOPY Left 04/20/2021   Procedure: VIDEO BRONCHOSCOPY WITH FLUORO;  Surgeon: Claudene Toribio BROCKS, MD;  Location: [facility];  Service: Endoscopy;  Laterality: Left;  EBUS   Social History   Socioeconomic History   Marital status: Widowed    Spouse name: Not on file   Number of children: Not on file   Years of education: Not on file   Highest education level: Not on file  Occupational History   Occupation: retired  Tobacco Use   Smoking status: Former    Current packs/day: 0.00    Average packs/day: 0.5 packs/day for 53.0 years (26.5 ttl pk-yrs)    Types: Cigarettes    Start date: 53    Quit date: 2013    Years since quitting: 12.0   Smokeless tobacco: Former    Quit date: 2013  Substance and Sexual Activity   Alcohol use: Not Currently    Comment: h/o heavy use - quit in 2013   Drug use: Never   Sexual activity: Not on file  Other Topics Concern   Not on file  Social History Narrative  Not on file   Social Drivers of Health   Financial Resource Strain: Not on file  Food Insecurity: No Food Insecurity (01/16/2023)   Hunger Vital Sign    Worried About Running Out of Food in the Last Year: Never true    Ran Out of Food in the Last Year: Never true  Transportation Needs: No Transportation Needs (01/17/2023)   PRAPARE - Administrator, Civil Service (Medical): No    Lack of Transportation (Non-Medical): No  Physical Activity: Not on file  Stress: Not on file  Social Connections: Moderately Integrated (01/17/2023)   Social Connection and Isolation Panel  [NHANES]    Frequency of Communication with Friends and Family: More than three times a week    Frequency of Social Gatherings with Friends and Family: More than three times a week    Attends Religious Services: More than 4 times per year    Active Member of [employer] or Organizations: Yes    Attends Banker Meetings: 1 to 4 times per year    Marital Status: Widowed  Intimate Partner Violence: Not At Risk (01/17/2023)   Humiliation, Afraid, Rape, and Kick questionnaire    Fear of Current or Ex-Partner: No    Emotionally Abused: No    Physically Abused: No    Sexually Abused: No   No Known Allergies  Last set of Vital Signs (not current) Vitals:   01/17/23 0231 01/17/23 0235  BP: (!) 154/86 (!) 141/64  Pulse:    Resp: 17 16  Temp:    SpO2:        Physical Exam  Gen: unresponsive Cardiovascular: pulseless  Resp: apneic. Breath sounds equal bilaterally with bagging  Abd: nondistended  Neuro: GCS 3, unresponsive to pain  HEENT: No blood in posterior pharynx, gag reflex absent  Neck: No crepitus  Musculoskeletal: No deformity  Skin: warm  Procedures  INTUBATION Performed by: Lonni JINNY Seats Required items: required blood products, implants, devices, and special equipment available Patient identity confirmed: provided demographic data and hospital-assigned identification number Time out: Immediately prior to procedure a time out was called to verify the correct patient, procedure, equipment, support staff and site/side marked as required. Indications: cardiopulmonary arrest Intubation method: glidescope Preoxygenation: BVM Sedatives: etomidate  20mg  Paralytic: succinylcholine  100mg  Tube Size: 7.5 cuffed Post-procedure assessment: chest rise and ETCO2 monitor Breath sounds: equal and absent over the epigastrium Tube secured by Respiratory Therapy Patient tolerated the procedure well with no immediate complications.  CRITICAL CARE Performed by:  Lonni JINNY Seats Total critical care time: 30 Critical care time was exclusive of separately billable procedures and treating other patients. Critical care was necessary to treat or prevent imminent or life-threatening deterioration. Critical care was time spent personally by me on the following activities: development of treatment plan with patient and/or surrogate as well as nursing, discussions with consultants, evaluation of patient's response to treatment, examination of patient, obtaining history from patient or surrogate, ordering and performing treatments and interventions, ordering and review of laboratory studies, ordering and review of radiographic studies, pulse oximetry and re-evaluation of patient's condition.  Cardiopulmonary Resuscitation (CPR) Procedure Note  Directed/Performed by: Lonni JINNY Seats I personally directed ancillary staff and/or performed CPR in an effort to regain return of spontaneous circulation and to maintain cardiac, neuro and systemic perfusion.    Medical Decision making  Patient in cardiopulmonary arrest.  CPR, epi administered.  ROSC achieved. Patient intubated successfully.  Assessment and Plan  Transfer to ICU   Tiernan Millikin,  Lonni PARAS, MD 01/17/23 [phone]

## 2023-01-17 NOTE — Progress Notes (Signed)
  Echocardiogram 2D Echocardiogram has been performed.  Anna Finley 01/17/2023, 8:38 AM

## 2023-01-17 NOTE — Plan of Care (Signed)
 Patient placed on ventilator post code. FIO2 has come down to 30%. Able to follow commands.  Problem: Respiratory: Goal: Ability to maintain adequate ventilation will improve Outcome: Progressing   Problem: Respiratory: Goal: Ability to maintain a clear airway will improve Outcome: Progressing   Problem: Respiratory: Goal: Ability to maintain adequate ventilation will improve Outcome: Progressing

## 2023-01-17 NOTE — Progress Notes (Signed)
 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 She was dx with RLL PNA and started on Zithromax  and Rocephin . She has hypoK and being replaced.  Today, she had resp arrest and code blue was called and she achieved ROSC after one min, intubated and moved to ICU. She quit smoking 2012, smoked 62 PY.   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 CTA chest to r/o PE was recommended by accepting [facility] physician but not obtained. Admitted to [facility] to [facility]  12/31 cardiac arrest --variable reports of time until ROSC -- 6 to 15 min.  At one point shockable, was DF. Prolonged qtc and hypokalemic, some NSVT.  CTA chest still pending.   Interim History / Subjective:  12/31 night - cardiac arrest   Objective   Blood pressure (!) 103/56, pulse 89, temperature (!) 97.3 F (36.3 C), resp. rate 20, height 5' 5 (1.651 m), weight 39.9 kg, SpO2 100%.    Vent Mode: PRVC FiO2 (%):  [30 %-40 %] 30 % Set Rate:  [16 bmp] 16 bmp Vt Set:  [340 mL-450 mL] 340 mL PEEP:  [5 cmH20] 5 cmH20 Plateau Pressure:  [17 cmH20-22 cmH20] 19 cmH20   Intake/Output Summary (Last 24 hours) at 01/17/2023 1050 Last data filed at 01/17/2023 0912 Gross per 24 hour  Intake 1912.84 ml  Output 200 ml  Net 1712.84  ml   Filed Weights   01/16/23 1018 01/17/23 0200  Weight: 43.1 kg 39.9 kg    Examination: General: Cachectic critically ill appearing elderly F intubated sedated  HENT: NCAT pink mm anicteric sclera Lungs: Diminished R sided sounds. Mechanically ventilated  Cardiovascular: rr cap refill < 3 sec  Abdomen: soft ndnt  Extremities: no acute joint deformity no lower extremity edema  Neuro: sedated but awakens to voice and moves spontaneously. Reportedly following commands for nursing   Resolved Hospital Problem list    Lactic acidosis   Assessment & Plan:   Cardiac arrest, in-hospital Shock, unclear etiology vs sedation related hypotension  -this is documented as a respiratory arrest (was coughing and found hypoxic) but with prolonged qtc and lyte abnormalities as well as question of VF/torsades peri-arrest, post arrest NSVT, query if this was not resp arrest -has a decent neuro exam post ROSC  -there are variable reports of time until ROSC, up to 15 minutes.  P -echo, EKG, trop, BNP   -CTA chest  -optimize lytes  -abx as below  -hep gtt  -wean sedation as able goal RASS 0 to -1  -NE for MAP > 65  -pending workup / course for NSVT below, may end up needing cards consult. Holding for now while we obtain salient information   Prolonged qtc NSVT  -looking at her 12/21/22 [facility] presentation, qtc was 483. Noted to have some new  PVCs. Was also hypoK (3.3)  -here qtc has ranged 530 - 578.  P -optimize lytes -repeat EKG  -avoid qt prolonging agents; dc azithro, change to doxy, holding tagrisso     Acute resp failure with hypoxia  R/p PE  RLL PNA- aspiration  Stg IVb NSCLC - on tagrisso   COPD -high risk for PE -- hx internal jugular thrombus and is no longer taking eliquis  (some documentation eludes to cost being limiting factor), recent hip surgery, cancer.  -evidence of aspiration dating back to 10/2022 CT chest.  -was started on solumedrol post arrest (? possible AECOPD vs  PNA), but favor against AECOPD without hypercarbia.  P -CTA chest to r/o PE still pending  -dc azithro ( prolonged qtc) for doxy, cont rocephin   -follow up urine Ags, trach asp  -duonebs -some data for steroids in severe CAP, ok to cont for now  -looks like [facility] has informed onc of pts admission. Holding Tagrisso  acutely.   Hx non-occlusive RIJ thrombus -not taking eliquis  at home, unclear when she stopped  P  -hep gtt   Hypokalemia  Hyponatremia Hypermagnesemia  P -should have a BMP due mid-day 12/31-- will dc LF + Kcl after this when we know what next K is. Can replace K further per tube or IV, but dont think she needs 75 cc/hr LR -Repeat CMP mag ical 12/31 afternoon   Elevated LFTs  P -CMP 12/31 evening   Hx HTN -Hold antihypertensives, currently on low dose NE   Anemia Coagulopathy  -follow  CBC  Left hip fracture status post ORIF 12/16/2022, Dec 2024 fall with acute displaced oblique fracture of the left iliac crest  -PT/OT when stable  Severe PCM -EN per RDN   SDOH -in admitting note there is concern for finances playing a role in medication access. TOC consult was placed at that time     Best Practice (right click and Reselect all SmartList Selections daily)   Diet/type: tubefeeds DVT prophylaxis systemic heparin  Pressure ulcer(s): N/A GI prophylaxis: H2B Lines: N/A Foley:  Yes, and it is still needed Code Status:  full code Last date of multidisciplinary goals of care discussion [12/31 post arrest]   Labs   CBC: Recent Labs  Lab 01/16/23 1028 01/17/23 0056 01/17/23 0503  WBC 5.2 7.9 9.1  NEUTROABS 4.0  --   --   HGB 8.4* 7.8* 7.8*  HCT 27.8* 27.7* 26.9*  MCV 87.1 94.5 91.5  PLT 415* 363 356    Basic Metabolic Panel: Recent Labs  Lab 01/16/23 1028 01/16/23 1443 01/16/23 2356 01/17/23 0056 01/17/23 0503 01/17/23 0918  NA 134*  --   --  129* 129* 134*  K 2.8*  --  2.5* 2.9* 3.8 4.0  CL 93*  --   --  93* 93* 99  CO2 27  --   --  20*  25 24  GLUCOSE 106*  --   --  162* 144* 134*  BUN 12  --   --  13 14 16   CREATININE 0.62  --   --  0.86 0.70 0.71  CALCIUM  8.6*  --   --  7.8* 7.7* 8.1*  MG  --  1.8  --  2.2 2.7*  --    GFR: Estimated Creatinine Clearance: 37.7 mL/min (by C-G formula based on SCr of 0.71 mg/dL). Recent Labs  Lab 01/16/23 1028 01/16/23 1258 01/17/23 0055 01/17/23 0056 01/17/23 0503 01/17/23 0918  WBC 5.2  --   --  7.9 9.1  --  LATICACIDVEN 1.1 0.9 4.9*  --   --  1.3    Liver Function Tests: Recent Labs  Lab 01/16/23 1028 01/17/23 0056 01/17/23 0503  AST 17 165* 171*  ALT 10 54* 55*  ALKPHOS 165* 161* 159*  BILITOT 0.5 0.6 0.7  PROT 7.0 6.1* 5.9*  ALBUMIN 2.9* 2.5* 2.5*   No results for input(s): LIPASE, AMYLASE in the last 168 hours. No results for input(s): AMMONIA in the last 168 hours.  ABG    Component Value Date/Time   PHART 7.44 01/17/2023 0227   PCO2ART 43 01/17/2023 0227   PO2ART 123 (H) 01/17/2023 0227   HCO3 29.9 (H) 01/17/2023 0227   O2SAT 99.8 01/17/2023 0227     Coagulation Profile: Recent Labs  Lab 01/16/23 1028 01/17/23 0503  INR 1.1 1.3*    Cardiac Enzymes: No results for input(s): CKTOTAL, CKMB, CKMBINDEX, TROPONINI in the last 168 hours.  HbA1C: Hgb A1c MFr Bld  Date/Time Value Ref Range Status  04/21/2021 05:00 PM 5.5 4.8 - 5.6 % Final    Comment:    (NOTE)         Prediabetes: 5.7 - 6.4         Diabetes: >6.4         Glycemic control for adults with diabetes: <7.0   04/19/2021 11:03 AM 5.5 4.8 - 5.6 % Final    Comment:    (NOTE)         Prediabetes: 5.7 - 6.4         Diabetes: >6.4         Glycemic control for adults with diabetes: <7.0     CBG: Recent Labs  Lab 01/17/23 0009 01/17/23 0223 01/17/23 0342 01/17/23 0755  GLUCAP 94 109* 101* 128*    CRITICAL CARE Performed by: Ronnald FORBES Gave   Total critical care time: 55 minutes  Critical care time was exclusive of separately billable procedures and treating  other patients. Critical care was necessary to treat or prevent imminent or life-threatening deterioration.  Critical care was time spent personally by me on the following activities: development of treatment plan with patient and/or surrogate as well as nursing, discussions with consultants, evaluation of patient's response to treatment, examination of patient, obtaining history from patient or surrogate, ordering and performing treatments and interventions, ordering and review of laboratory studies, ordering and review of radiographic studies, pulse oximetry and re-evaluation of patient's condition.  Ronnald Gave MSN, AGACNP-BC [facility] Amion for pager  01/17/2023, 10:50 AM

## 2023-01-17 NOTE — Progress Notes (Signed)
 Initial Nutrition Assessment  DOCUMENTATION CODES:   Severe malnutrition in context of chronic illness  INTERVENTION:  - Per CCM, start tube feeds today.  Initiate tube feeding via OGT: Vital 1.2 at 45 ml/h (1080 ml per day) *Start at 15mL/hr and advance by 10mL Q12H  Provides 1296 kcal, 81 gm protein, 876 ml free water daily  - Monitor magnesium , potassium, and phosphorus BID for at least 3 days, MD to replete as needed, as pt is at risk for refeeding syndrome.  - Add Thiamine  100 mg daily for 5 days  - FWF per CCM/MD.   - Monitor weight trends.   NUTRITION DIAGNOSIS:   Severe Malnutrition related to chronic illness (NSCLC stage IV-b; COPD) as evidenced by severe fat depletion, severe muscle depletion.  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 Found to have sepsis d/t PNA.  12/30 Admit 12/31 cardiac arrest -> intubated  Patient is currently intubated on ventilator support MV: 8.1 L/min Temp (24hrs), Avg:97.7 F (36.5 C), Min:97.1 F (36.2 C), Max:98.7 F (37.1 C) OGT xray verified in the stomach.  No family/visitors at bedside to obtain nutrition history.   Patient very malnourished with severe muscle and fat wasting however weight appears stable over the past year from 84-87#.   Per discussion with CCM, start tube feeds today. Will plan to start at 21mL/hr due to severely malnourished as well as hypokalemia yesterday into today.  CCM on board for adding thiamine  and checking electrolytes Q12H. Discussed plan with RN.  Per CCM, patient  may be extubated today however plan to go ahead and start tube feeds and pause them if extubated.   Medications reviewed and include: Miralax  Levophed  @ 1 mcg/min Propofol  @ 14.60mL/hr (provides 375 kcals over 24 hours)  Labs reviewed:  Na 129  Latest Reference Range & Units 01/16/23 23:56 01/17/23 00:56 01/17/23 05:03 01/17/23 09:18  Potassium 3.5 - 5.1 mmol/L 2.5 (LL) 2.9 (L) 3.8 4.0  (LL): Data is critically low (L): Data is abnormally low   NUTRITION - FOCUSED PHYSICAL EXAM:  Flowsheet Row Most Recent Value  Orbital Region Severe depletion  Upper Arm Region Severe depletion  Thoracic and Lumbar Region Severe depletion  Buccal Region Unable to assess  Temple Region Severe depletion  Clavicle Bone Region Severe depletion  Clavicle and Acromion Bone Region Severe depletion  Scapular Bone Region Unable to assess  Dorsal Hand Severe depletion  Patellar Region Severe depletion  Anterior Thigh Region Severe depletion  Posterior Calf Region Severe depletion  Edema (RD Assessment) None  Hair Reviewed  Eyes Reviewed  Mouth Reviewed  Skin Reviewed  Nails Reviewed       Diet Order:   Diet Order             Diet NPO time specified  Diet effective now                   EDUCATION NEEDS:  Not appropriate for education at this time  Skin:  Skin Assessment: Skin Integrity Issues: Skin Integrity Issues:: Unstageable Unstageable: Left heel  Last BM:  12/31 - type 4  Height:  Ht Readings from Last 1 Encounters:  01/17/23 5' 5 (1.651 m)   Weight:  Wt Readings from Last 1 Encounters:  01/17/23 39.9 kg    BMI:  Body mass index is 14.64 kg/m.  Estimated Nutritional Needs:  Kcal:  1200-1400 kcals Protein:  70-80 grams Fluid:  >/= 1.4L    Trude Ned RD, LDN Contact via Secure Chat.

## 2023-01-17 NOTE — Progress Notes (Addendum)
 PHARMACY - ANTICOAGULATION CONSULT NOTE  Pharmacy Consult for Heparin  Indication: H/O right internal jugular thrombus (previously on apixaban , but not currently); r/o PE  No Known Allergies  Patient Measurements: Height: 5' 5 (165.1 cm) Weight: 43.1 kg (95 lb) IBW/kg (Calculated) : 57 Heparin  Dosing Weight: actual body weight  Vital Signs: Temp: 97.8 F (36.6 C) (12/30 2222) Temp Source: Oral (12/30 2222) BP: 65/46 (12/31 0130) Pulse Rate: 130 (12/31 0008)  Labs: Recent Labs    01/16/23 1028 01/16/23 2356 01/17/23 0056  HGB 8.4*  --  7.8*  HCT 27.8*  --  27.7*  PLT 415*  --  363  APTT  --  31  --   LABPROT 14.5  --   --   INR 1.1  --   --   CREATININE 0.62  --  0.86  TROPONINIHS  --   --  81*    Estimated Creatinine Clearance: 37.9 mL/min (by C-G formula based on SCr of 0.86 mg/dL).   Medical History: Past Medical History:  Diagnosis Date   COPD (chronic obstructive pulmonary disease) with chronic bronchitis (HCC)    HTN (hypertension)    Pneumothorax     Medications:  No oral anticoagulation PTA (Previously on apixaban , which patient reports was discontinued by provider; no recent refill history for apixaban  noted)  Assessment: 77 yr female admitted 12/30 with sepsis due to PNA. PMH significant for stave IV NSCLC, COPD, right internal jugular thrombus, anemia of chronic illness, s/p ORIF left hip fracture Nov 2024.  Patient s/p respiratory arrest 12/31 and now intubated  Baseline INR = 1.1 Baseline aPTT = 31 sec  Goal of Therapy:  Heparin  level 0.3-0.7 units/ml Monitor platelets by anticoagulation protocol: Yes   Plan:  No heparin  bolus upon initiation per Dr Haze (E-link) Begin IV heparin  gtt @ 700 units/hr Check heparin  level 8 hr after heparin  gtt started Check daily heparin  level & CBC  Teresha Hanks, Arvin Fletcher, PharmD 01/17/2023,2:05 AM

## 2023-01-17 NOTE — Progress Notes (Signed)
 Patient having multiple runs of vtach. Typically around 3-5 beats, but has been as many as 12 beats. Provider notified.

## 2023-01-17 NOTE — Progress Notes (Signed)
 Code blue announced to room. Code blue resuscitative emasures in progress. Pt w/ current working PIV.

## 2023-01-17 NOTE — Progress Notes (Signed)
 Pt transported to CT and back with no complications.

## 2023-01-17 NOTE — Progress Notes (Signed)
 PHARMACY - ANTICOAGULATION CONSULT NOTE  Pharmacy Consult for Heparin  Indication: r/o PE; hx right internal jugular thrombus (previously on apixaban , but not currently)  No Known Allergies  Patient Measurements: Height: 5' 5 (165.1 cm) Weight: 39.9 kg (87 lb 15.4 oz) IBW/kg (Calculated) : 57 Heparin  Dosing Weight: actual body weight  Vital Signs: Temp: 97 F (36.1 C) (12/31 1230) Temp Source: Bladder (12/31 1200) BP: 112/63 (12/31 1230) Pulse Rate: 89 (12/31 0800)  Labs: Recent Labs    01/16/23 1028 01/16/23 1028 01/16/23 2356 01/17/23 0056 01/17/23 0459 01/17/23 0503 01/17/23 0918 01/17/23 1138  HGB 8.4*  --   --  7.8*  --  7.8*  --   --   HCT 27.8*  --   --  27.7*  --  26.9*  --   --   PLT 415*  --   --  363  --  356  --   --   APTT  --   --  31  --   --   --   --   --   LABPROT 14.5  --   --   --   --  16.4*  --   --   INR 1.1  --   --   --   --  1.3*  --   --   HEPARINUNFRC  --   --   --   --   --   --   --  0.11*  CREATININE 0.62  --   --  0.86  --  0.70 0.71  --   TROPONINIHS  --    < >  --  81* 125*  --  104* 101*   < > = values in this interval not displayed.    Estimated Creatinine Clearance: 37.7 mL/min (by C-G formula based on SCr of 0.71 mg/dL).   Medical History: Past Medical History:  Diagnosis Date   COPD (chronic obstructive pulmonary disease) with chronic bronchitis (HCC)    HTN (hypertension)    Pneumothorax     Medications:  No oral anticoagulation PTA (Previously on apixaban , which patient reports was discontinued by provider; no recent refill history for apixaban  noted)  Assessment: 77 yr female admitted 12/30 with sepsis due to PNA. PMH significant for stave IV NSCLC, COPD, right internal jugular thrombus, anemia of chronic illness, s/p ORIF left hip fracture Nov 2024.  Patient s/p respiratory arrest 12/31 and now intubated.  Today: -Heparin  level 0.11 - subtherapeutic on heparin  infusion at 700 units/hr -Hgb with slight drop;  plt stable -No complications of therapy noted  Goal of Therapy:  Heparin  level 0.3-0.7 units/ml Monitor platelets by anticoagulation protocol: Yes   Plan:  -Increase heparin  infusion to 800 units/hr -Recheck heparin  level 8 hr after rate change -Check daily heparin  level & CBC -Follow up imaging  Stefano MARLA Bologna, PharmD, BCPS Clinical Pharmacist 01/17/2023 1:19 PM

## 2023-01-17 NOTE — Progress Notes (Addendum)
 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  Had a cardiac arrest on the wards and after achieving ROSC, the patient was referred to critical care for further management.  Intubation performed with return of spontaneous circulation after roughly 15 minutes.  Complaining of 3 days of coughing, dyspnea with exertion, without any fevers or chills or loss of consciousness.  She was tachypneic, tachycardic, saturating 100% on room air.  On examination, she was initially quite agitated moving all extremities equally.  Mechanically ventilated with spontaneous breathing above the ventilator.  I dose of Versed  was administered with improved synchrony and reduced discomfort.  Laboratory studies consistent with severe hyperkalemia, hypomagnesemia, normocytic anemia and thrombocytosis.  Postintubation chest radiograph with endotracheal tube right above the carina while 24 at the lip.  eICU Interventions  Post ROSC labs/imaging are pending.  Will continue to follow. Add BMP every 6 hours for 24 hours.  OG tube placed, radiograph pending.  Switch CT chest without contrast to CT angiography of the chest given recent IJ thrombus.  Has been on anticoagulation with Eliquis , but high propensity to develop clots in the setting of recent hip surgery and cancer.  Appeared to be in ventricular fibrillation during the code.  Received defibrillation x 1.  Post ROSC EKG consistent with sinus tachycardia.  Ground team evaluation still pending.  GI prophylaxis-will add famotidine  DVT prophylaxis with enoxaparin .  May need to adjust dose with weight   0121 -add low-dose  peripheral norepinephrine , okay to use feeding tube, attempted to update family at bedside but they are grieving  75 -multiple runs of nonsustained VT noted.  Ongoing electrolyte replacement.  If this persists after electrolyte repletion, may need to initiate antiarrhythmic and discuss the case with cardiology especially given concern for VT/V-fib during the code.  Intervention Category Evaluation Type: New Patient Evaluation  Sumaiya Arruda 01/17/2023, 12:31 AM

## 2023-01-17 NOTE — Progress Notes (Signed)
 RT adjusted pts VT from 8 cc to 6 ccs per MD order.     01/17/23 0200  Adult Ventilator Settings  Vent Type Servo i  Humidity HME  Vent Mode PRVC  Vt Set (S)  340 mL  Set Rate 16 bmp  FiO2 (%) 40 %  I Time 39 Sec(s)  I:E Ratio Set 1:1.3  PEEP 5 cmH20

## 2023-01-17 NOTE — Progress Notes (Signed)
 RT obtained ABG on pt w/the following results on vent settings of 340/16/40%/5+.    Latest Reference Range & Units Most Recent  Delivery systems  VENTILATOR 01/17/23 02:27  Mode  PRESSURE REGULATED VOLUME CONTROL 01/17/23 02:27  FIO2 % 40 01/17/23 02:27  RATE resp/min 16 01/17/23 02:27  PEEP cm H20 5 01/17/23 02:27  MECHVT mL 450 01/17/23 02:27  pH, Arterial 7.35 - 7.45  7.44 01/17/23 02:27  pCO2 arterial 32 - 48 mmHg 43 01/17/23 02:27  pO2, Arterial 83 - 108 mmHg 123 (H) 01/17/23 02:27  Acid-Base Excess 0.0 - 2.0 mmol/L 4.8 (H) 01/17/23 02:27  Bicarbonate 20.0 - 28.0 mmol/L 29.9 (H) 01/17/23 02:27  O2 Saturation % 99.8 01/17/23 02:27  Patient temperature  36.2 01/17/23 02:27  Collection site  RIGHT BRACHIAL 01/17/23 02:27  Allens test (pass/fail) PASS  PASS 01/17/23 02:27  (H): Data is abnormally high

## 2023-01-17 NOTE — Procedures (Signed)
 Upon arrival to code alarm RT Belvie bagging pt while CPR was being performed. This RT assisted MD Pollina w/intubation. MD inserted 7.5 ETT 24 @ lip placement confirmed w/BLBS and ETCO2 detector color change and commercial tube holder was placed. Pt placed on vent PRVC 8CCs RR 16, PEEP 5 and 40%. RT will continue to monitor.

## 2023-01-17 NOTE — Progress Notes (Signed)
 Patient coughing, eye opening to voice, patient able to make eye contact at this time, squeeze nurses hand, and nod appropriately. PRN fentanyl given and propofol increased per emar.

## 2023-01-17 NOTE — Progress Notes (Addendum)
 Code blue called overhead.  Upon arrival, High quality ACLS underway.  EDP, Dr. Haze, present and running the code.  ROSC achieved.  Intubated by EDP.  CCM night attending, Dr. Dub, made aware.  Patient transferred to the ICU intubated.  We will continue to closely monitor and treat as indicated.  Patient's daughter updated.  Time:  15 minutes.

## 2023-01-17 NOTE — Progress Notes (Addendum)
 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 Recent hospitalization at [facility] with hip fracture status post ORIF 12/16/2022.  Left side. ER visit December 21, 2022 after a fall at home, patient seems to have had acute displaced oblique fracture left iliac crest at the time, patient was discharged home. Hypokalemic  History of present illness   Witnessed arrest by RN in room.  CODE BLUE called, CPR started/ACLS started with arrival of rapid response RN and floor coverage NP.  Intubation by ER physician Dr. Haze, 1- attempt with no apparent difficulty.-X-ray pending Return of respirations (ineffective) and return of pulse achieved,  1 - Shock delivered. Several Epi doses (see code sheet) First returned rhythm appeared to be sinus tach 120. Please see code sheet for approximate times/medications  Admitted through med [facility] with complaints of 3 days of coughing and shortness of breath with exertion particularly while walking with her walker. Admission notes no fever, palpitations, loss of consciousness, leg swelling or cramping Chest pain only when coughing  Past Medical History  Stage IVb right lung cancer COPD Right IJ thrombus on Eliquis  Hypertension Status post ORIF 12/16/2022 Displaced oblique fracture of left iliac crest December 21, 2022  Significant Hospital Events   CODE BLUE Intubation/sedation Transferred to ICU  Consults:  CCM-Dr. Dub .SABRAen-route CCM-Dr. Haze  -E-Link in room by remote access - spoke w in room by camera  Procedures:  ETT NGT Foley  Significant Diagnostic Tests:  CXR-pending ABG-1 hour -pending CMP-pending CBC-pending Lactic acid-pending  Micro Data:  Blood culture - pending  Antimicrobials:  Vancomycin  IVPB 750 mg/150 mL once -ordered Zithromax  500 mg/250 mL IVPB scheduled Rocephin  2 g/100 mL -scheduled  Interim history/subjective:  Patient is currently in ICU intubated/sedated being assessed by E-Link Physician.(Remote)  Potassium replacement continues.  Objective   Blood pressure (!) 139/108, pulse (!) 130, temperature 97.8 F (36.6 C), temperature source Oral, resp. rate 16, height 5' 5 (1.651 m), weight 43.1 kg, SpO2 100%.    Vent Mode: PRVC FiO2 (%):  [40 %] 40 % Set Rate:  [16 bmp] 16 bmp Vt Set:  [450 mL] 450 mL PEEP:  [5 cmH20] 5 cmH20 Plateau Pressure:  [22 cmH20] 22 cmH20  Intake/Output Summary (Last 24 hours) at 01/17/2023 0039 Last data filed at 01/16/2023 1621 Gross per 24 hour  Intake 633.65 ml  Output --  Net 633.65 ml   Filed Weights   01/16/23 1018  Weight: 43.1 kg    Examination: General: intubated, sedated HENT:  dry MM Lungs: Coarse Cardiovascular: Tachycardia, no JVD  Abdomen: soft, nontender Extremities: warm, well-perfused without cyanosis, edema Neuro: unable , sedated  Best practice:  Diet: NPO Pain/Anxiety/Delirium protocol  VAP protocol  DVT prophylaxis: Lovenox  Glucose control: CBG every 4 hours Code Status: FULL Family Communication: on site Disposition: ICU   Labs   CBC: Recent Labs  Lab 01/16/23 1028  WBC 5.2  NEUTROABS 4.0  HGB 8.4*  HCT 27.8*  MCV 87.1  PLT 415*    Basic Metabolic Panel: Recent Labs  Lab 01/16/23 1028 01/16/23 1443 01/16/23 2356  NA 134*  --   --   K 2.8*  --  2.5*  CL 93*  --   --   CO2 27  --   --   GLUCOSE 106*  --   --   BUN 12  --   --   CREATININE 0.62  --   --   CALCIUM  8.6*  --   --   MG  --  1.8  --     GFR: Estimated Creatinine Clearance: 40.7 mL/min (by C-G formula based on SCr of 0.62 mg/dL). Recent Labs  Lab 01/16/23 1028 01/16/23 1258  WBC 5.2  --   LATICACIDVEN 1.1 0.9    Liver Function Tests: Recent Labs  Lab 01/16/23 1028  AST 17  ALT 10  ALKPHOS 165*  BILITOT 0.5  PROT 7.0  ALBUMIN 2.9*   No results for input(s): LIPASE, AMYLASE in the last 168 hours. No results for input(s): AMMONIA in the last 168 hours.  ABG No results found for: PHART, PCO2ART, PO2ART, HCO3, TCO2, ACIDBASEDEF, O2SAT   Coagulation Profile: Recent Labs  Lab 01/16/23 1028  INR 1.1    Cardiac Enzymes: No results for input(s): CKTOTAL, CKMB, CKMBINDEX, TROPONINI in the last 168 hours.  HbA1C: Hgb A1c MFr Bld  Date/Time Value Ref Range Status  04/21/2021 05:00 PM 5.5 4.8 - 5.6 % Final    Comment:    (NOTE)         Prediabetes: 5.7 - 6.4         Diabetes: >6.4         Glycemic control for adults with diabetes: <7.0   04/19/2021 11:03 AM 5.5 4.8 - 5.6 % Final    Comment:    (NOTE)         Prediabetes: 5.7 - 6.4         Diabetes: >6.4         Glycemic control for adults with diabetes: <7.0     CBG: Recent Labs  Lab 01/17/23 0009  GLUCAP 94    Review of Systems:   A twelve point review of systems is unable to be obtained, Post CODE -sedated  Past Medical History  She,  has a past medical history of COPD (chronic obstructive pulmonary disease) with chronic bronchitis (HCC), HTN (hypertension), and Pneumothorax.   Surgical History    Past Surgical History:  Procedure Laterality Date   BRONCHIAL NEEDLE ASPIRATION BIOPSY  04/20/2021   Procedure: BRONCHIAL NEEDLE ASPIRATION BIOPSIES;  Surgeon: Claudene Toribio BROCKS, MD;  Location: [facility];  Service: Endoscopy;;   BRONCHIAL WASHINGS  04/20/2021   Procedure: BRONCHIAL WASHINGS;  Surgeon:  Claudene Toribio BROCKS, MD;  Location: [facility];  Service: Endoscopy;;   ENDOBRONCHIAL ULTRASOUND N/A 04/20/2021    Procedure: ENDOBRONCHIAL ULTRASOUND;  Surgeon: Claudene Toribio BROCKS, MD;  Location: [facility];  Service: Endoscopy;  Laterality: N/A;   HIP ARTHROPLASTY     VIDEO BRONCHOSCOPY Left 04/20/2021   Procedure: VIDEO BRONCHOSCOPY WITH FLUORO;  Surgeon: Claudene Toribio BROCKS, MD;  Location: [facility];  Service: Endoscopy;  Laterality: Left;  EBUS     Social History   reports that she quit smoking about 12 years ago. Her smoking use included cigarettes. She started smoking about 65 years ago. She has a 26.5 pack-year smoking history. She quit smokeless tobacco use about 12 years ago. She reports that she does not currently use alcohol. She reports that she does not use drugs.   Family History   Her family history includes Cancer (age of onset: 69) in her father.   Allergies No Known Allergies   Home Medications  Prior to Admission medications   Medication Sig Start Date End Date Taking? Authorizing Provider  acetaminophen  (TYLENOL ) 500 MG tablet Take 500 mg by mouth in the morning, at noon, and at bedtime.   Yes [provider]  aspirin  EC 325 MG tablet Take 325 mg by mouth daily.   Yes [provider]  atenolol -chlorthalidone  (TENORETIC ) 50-25 MG tablet Take 1 tablet by mouth daily. 05/08/21  Yes [provider]  montelukast  (SINGULAIR ) 10 MG tablet Take 10 mg by mouth at bedtime. 03/23/21  Yes [provider]  osimertinib  mesylate (TAGRISSO ) 80 MG tablet Take 1 tablet (80 mg total) by mouth daily. 12/19/22  Yes Heilingoetter, Cassandra L, PA-C  WIXELA INHUB 250-50 MCG/ACT AEPB Inhale 1 puff into the lungs 2 (two) times daily. 03/23/21  Yes [provider]  apixaban  (ELIQUIS ) 5 MG TABS tablet TAKE 1 TABLET(5 MG) BY MOUTH TWICE DAILY. FOLLOW UP WITH DISCARD REMAINDER. MOHAMED FOR ADDITIONAL REFILLS Patient not taking: Reported on 01/16/2023 05/21/22   Heilingoetter, Cassandra L, PA-C  atenolol  (TENORMIN ) 50 MG tablet Take 1 tablet (50 mg total) by mouth daily. Patient  not taking: Reported on 01/16/2023 04/28/21 01/16/23  Perri DELENA Meliton Mickey., MD  polyethylene glycol (MIRALAX  / GLYCOLAX ) 17 g packet Take 17 g by mouth daily as needed for mild constipation. Meds to bed, thanks Patient not taking: Reported on 01/16/2023 04/27/21   Perri DELENA Meliton Mickey., MD  potassium chloride  SA (KLOR-CON  M) 20 MEQ tablet Take 1 tablet (20 mEq total) by mouth daily. Patient not taking: Reported on 01/16/2023 03/21/22   Sherrod Sherrod, MD     Conversation with Family conducted by Dr Dub (CCM) and I results in Family conveying that the wishes are to remain FULL CODE and proceed with any/all types of life support needed.    Patient Remains FULL CODE per Family wishes. 0125 hrs 01/17/2023    Lynwood Kipper MSNA MSN ACNPC-AG Acute Care Nurse Practitioner [facility] [place]  -------------------------------------------------------------------------------  Update 0133 hrs Patient is now requiring pressor support.  CCM is now Primary   Imaging:   FINDINGS: Endotracheal tube is noted in satisfactory position 2 cm above the carina. Cardiac shadow is stable. Persistent right basilar airspace opacity is noted. No sizable effusion is noted. Skin fold is noted over the left chest. No acute bony abnormality is seen.   IMPRESSION: Persistent right basilar opacity.   Endotracheal tube in satisfactory position.     Electronically Signed   By: Oneil Evelyne HERO.D.  On: 01/17/2023 00:46     Lynwood Kipper BSN MSNA MSN ACNPC-AG Acute Care Nurse Practitioner [facility]

## 2023-01-18 DIAGNOSIS — A419 Sepsis, unspecified organism: Principal | ICD-10-CM

## 2023-01-18 DIAGNOSIS — J189 Pneumonia, unspecified organism: Secondary | ICD-10-CM

## 2023-01-18 LAB — CBC
HCT: 25 % — ABNORMAL LOW (ref 36.0–46.0)
Hemoglobin: 7.3 g/dL — ABNORMAL LOW (ref 12.0–15.0)
MCH: 26.4 pg (ref 26.0–34.0)
MCHC: 29.2 g/dL — ABNORMAL LOW (ref 30.0–36.0)
MCV: 90.3 fL (ref 80.0–100.0)
Platelets: 334 10*3/uL (ref 150–400)
RBC: 2.77 MIL/uL — ABNORMAL LOW (ref 3.87–5.11)
RDW: 16.8 % — ABNORMAL HIGH (ref 11.5–15.5)
WBC: 11.5 10*3/uL — ABNORMAL HIGH (ref 4.0–10.5)
nRBC: 0 % (ref 0.0–0.2)

## 2023-01-18 LAB — CALCIUM, IONIZED: Calcium, Ionized, Serum: 4.8 mg/dL (ref 4.5–5.6)

## 2023-01-18 LAB — GLUCOSE, CAPILLARY
Glucose-Capillary: 135 mg/dL — ABNORMAL HIGH (ref 70–99)
Glucose-Capillary: 145 mg/dL — ABNORMAL HIGH (ref 70–99)
Glucose-Capillary: 154 mg/dL — ABNORMAL HIGH (ref 70–99)
Glucose-Capillary: 172 mg/dL — ABNORMAL HIGH (ref 70–99)
Glucose-Capillary: 180 mg/dL — ABNORMAL HIGH (ref 70–99)

## 2023-01-18 LAB — MAGNESIUM
Magnesium: 2.4 mg/dL (ref 1.7–2.4)
Magnesium: 2.2 mg/dL (ref 1.7–2.4)

## 2023-01-18 LAB — COMPREHENSIVE METABOLIC PANEL
ALT: 33 U/L (ref 0–44)
ALT: 31 U/L (ref 0–44)
AST: 44 U/L — ABNORMAL HIGH (ref 15–41)
AST: 39 U/L (ref 15–41)
Albumin: 2.4 g/dL — ABNORMAL LOW (ref 3.5–5.0)
Albumin: 2.2 g/dL — ABNORMAL LOW (ref 3.5–5.0)
Alkaline Phosphatase: 138 U/L — ABNORMAL HIGH (ref 38–126)
Alkaline Phosphatase: 122 U/L (ref 38–126)
Anion gap: 10 (ref 5–15)
Anion gap: 10 (ref 5–15)
BUN: 19 mg/dL (ref 8–23)
BUN: 20 mg/dL (ref 8–23)
CO2: 24 mmol/L (ref 22–32)
CO2: 23 mmol/L (ref 22–32)
Calcium: 8.5 mg/dL — ABNORMAL LOW (ref 8.9–10.3)
Calcium: 8.1 mg/dL — ABNORMAL LOW (ref 8.9–10.3)
Chloride: 100 mmol/L (ref 98–111)
Chloride: 100 mmol/L (ref 98–111)
Creatinine, Ser: 0.93 mg/dL (ref 0.44–1.00)
Creatinine, Ser: 0.84 mg/dL (ref 0.44–1.00)
GFR, Estimated: >60 mL/min (ref >60)
GFR, Estimated: >60 mL/min (ref >60)
Glucose, Bld: 167 mg/dL — ABNORMAL HIGH (ref 70–99)
Glucose, Bld: 155 mg/dL — ABNORMAL HIGH (ref 70–99)
Potassium: 3.6 mmol/L (ref 3.5–5.1)
Potassium: 3.6 mmol/L (ref 3.5–5.1)
Sodium: 134 mmol/L — ABNORMAL LOW (ref 135–145)
Sodium: 133 mmol/L — ABNORMAL LOW (ref 135–145)
Total Bilirubin: 0.5 mg/dL (ref 0.0–1.2)
Total Bilirubin: 0.3 mg/dL (ref 0.0–1.2)
Total Protein: 5.9 g/dL — ABNORMAL LOW (ref 6.5–8.1)
Total Protein: 5.4 g/dL — ABNORMAL LOW (ref 6.5–8.1)

## 2023-01-18 LAB — TRIGLYCERIDES: Triglycerides: 93 mg/dL (ref <150)

## 2023-01-18 LAB — HEPARIN LEVEL (UNFRACTIONATED)
Heparin Unfractionated: 0.34 [IU]/mL (ref 0.30–0.70)
Heparin Unfractionated: 0.19 [IU]/mL — ABNORMAL LOW (ref 0.30–0.70)
Heparin Unfractionated: 0.44 [IU]/mL (ref 0.30–0.70)

## 2023-01-18 LAB — PHOSPHORUS
Phosphorus: 3.7 mg/dL (ref 2.5–4.6)
Phosphorus: 3.8 mg/dL (ref 2.5–4.6)

## 2023-01-18 MED ORDER — SODIUM CHLORIDE 0.9 % IV SOLN
250.0000 mL | INTRAVENOUS | Status: AC
  Administered 2023-01-18: 250 mL via INTRAVENOUS

## 2023-01-18 MED ORDER — CLONAZEPAM 1 MG PO TABS
1.0000 mg | ORAL_TABLET | Freq: Once | ORAL | Status: AC
  Administered 2023-01-18: 1 mg
  Filled 2023-01-18: qty 1

## 2023-01-18 MED ORDER — METHYLPREDNISOLONE SODIUM SUCC 40 MG IJ SOLR
40.0000 mg | Freq: Every day | INTRAMUSCULAR | Status: AC
  Administered 2023-01-19 – 2023-01-21 (×3): 40 mg via INTRAVENOUS
  Filled 2023-01-18 (×3): qty 1

## 2023-01-18 MED ORDER — NOREPINEPHRINE 4 MG/250ML-% IV SOLN: 2.0000 ug/min | INTRAVENOUS | Status: DC

## 2023-01-18 NOTE — Progress Notes (Signed)
 eLink Physician-Brief Progress Note Patient Name: Anna Finley DOB: [birth year] MRN: [id number]   Date of Service  01/18/2023  HPI/Events of Note  78 year old female with stage IV NSCLC, COPD, right internal jugular thrombus, recent left hip fracture s/p ORIF 11/2022 with recent falls admitted for sepsis secondary to right pneumonia.   Blood pressure is downtrending despite walking down sedation.  Net 1.2 L positive the past 24 hours and 2.7 L for the stay.  Somewhat oliguric.  eICU Interventions  Initiate peripheral vasopressors as needed to maintain MAP greater than 65     Intervention Category Intermediate Interventions: Hypotension - evaluation and management  [name] 01/18/2023, 2:58 AM

## 2023-01-18 NOTE — Progress Notes (Signed)
 At 1549 RN reports sedation is off. RT placed PT on SBT (5/5)- PT unable to achieve MVe > 3.5 (low VT). RT increased PS up to 10- short run of VTach, HR continued to vary from 99-125 RN aware.

## 2023-01-18 NOTE — Progress Notes (Signed)
 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 She was dx with RLL PNA and started on Zithromax  and Rocephin . She has hypoK and being replaced.  Today, she had resp arrest and code blue was called and she achieved ROSC after one min, intubated and moved to ICU. She quit smoking 2012, smoked 62 PY.   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 CTA chest to r/o PE was recommended by accepting [facility] physician but not obtained. Admitted to [facility] to [facility]  12/31 cardiac arrest --variable reports of time until ROSC -- 6 to 15 min.  At one point shockable, was DF. Prolonged qtc and hypokalemic, some NSVT.  CTA chest still pending.   Interim History / Subjective:  Failed extubation due to tachycardia and agitation with secretions No episodes of NSVT since yesterday Objective   Blood pressure (!) 102/54, pulse (!) 120, temperature 97.7 F (36.5 C), temperature source Bladder, resp. rate (!) 24, height 5' 5 (1.651 m), weight 41.2 kg, SpO2 97%.    Vent Mode: PRVC FiO2 (%):  [30 %-100 %] 30 % Set Rate:  [16 bmp] 16 bmp Vt Set:  [340 mL] 340 mL PEEP:  [5 cmH20] 5 cmH20 Pressure Support:  [5 cmH20] 5 cmH20 Plateau Pressure:  [13 cmH20-17 cmH20] 13 cmH20   Intake/Output Summary (Last  24 hours) at 01/18/2023 1029 Last data filed at 01/18/2023 0800 Gross per 24 hour  Intake 1673.79 ml  Output 495 ml  Net 1178.79 ml   Filed Weights   01/16/23 1018 01/17/23 0200 01/18/23 0708  Weight: 43.1 kg 39.9 kg 41.2 kg   Physical Exam: General: Frail and cachectic-appearing, no acute distress HENT: [place], AT, ETT in place Eyes: EOMI, no scleral icterus Respiratory: Clear to auscultation bilaterally.  No crackles, wheezing or rales Cardiovascular: RRR, -M/R/G, no JVD GI: BS+, soft, nontender Extremities:-Edema,-tenderness Neuro: Awake but drowsy after sedation, follows commands, CNII-XII grossly intact GU: Foley in place   Resolved Hospital Problem list   Shock Lactic acidosis   Assessment & Plan:   Cardiac arrest, in-hospital Systolic heart failure (EF 30-35%), unclear if acute or chronic -this is documented as a respiratory arrest (was coughing and found hypoxic) but with prolonged qtc and lyte abnormalities as well as question of VF/torsades peri-arrest, post arrest NSVT, query if this was not resp arrest -has a decent neuro exam post ROSC  -there are variable reports of time until ROSC, up to 15 minutes.  -Echo reviewed with systolic heart failure. No prior echos available -BNP elevated 1272 P -Supportive care -optimize lytes  -hep gtt  -off vasopressor support -Will likely need Cardiology consult this admission  Prolonged qtc NSVT  -looking at her 12/21/22 [facility] presentation, qtc was 483. Noted to have some new PVCs. Was also hypoK (3.3)  -here qtc  has ranged 530 - 578.  P -Will likely need Cardiology consult this admission when stable -optimize lytes -avoid qt prolonging agents; dc azithro, change to doxy, holding tagrisso     Acute resp failure with hypoxia RLL PNA- aspiration  Right pleural effusion Stg IVb NSCLC - on tagrisso   COPD -high risk for PE -- hx internal jugular thrombus and is no longer taking eliquis  (some documentation eludes to cost being  limiting factor), recent hip surgery, cancer.  -evidence of aspiration dating back to 10/2022 CT chest.  -was started on solumedrol post arrest (? possible AECOPD vs PNA), but favor against AECOPD without hypercarbia.  -CTA 12/31 neg for PE, moderate right pleural effusion P -Full vent support -LTVV, 4-8cc/kg IBW with goal Pplat<30 and DP<15 -Repeat SBT/WUA later this AM. Potential extubation today -Although CTA neg. Continue heparin  for hx internal jugular thrombus -Continue rocephin . DC'd azithro 12/31 ( prolonged qtc) for doxy  -follow up urine Ags, trach asp  -duonebs -Continue solumedrol x 5 days total (end date 1/3) -TRH previously discussed with Oncology to hold Tagrisso  acutely.   Hx non-occlusive RIJ thrombus -not taking eliquis  at home, unclear when she stopped  P  -hep gtt   Hypokalemia  Hyponatremia Hypermagnesemia  P -should have a BMP due mid-day 12/31-- will dc LF + Kcl after this when we know what next K is. Can replace K further per tube or IV, but dont think she needs 75 cc/hr LR -Repeat CMP mag ical 12/31 afternoon   Elevated LFTs  - improving P -Trend PRN  Hx HTN -Hold antihypertensives  Anemia Coagulopathy  -Trend CBC -Goal Hg >7  Left hip fracture status post ORIF 12/16/2022, Dec 2024 fall with acute displaced oblique fracture of the left iliac crest  -PT/OT when stable  Severe PCM -EN per RDN   SDOH -in admitting note there is concern for finances playing a role in medication access. TOC consult was placed at that time     Best Practice (right click and Reselect all SmartList Selections daily)   Diet/type: tubefeeds DVT prophylaxis systemic heparin  Pressure ulcer(s): N/A GI prophylaxis: H2B Lines: N/A Foley:  Yes, and it is still needed Code Status:  full code Last date of multidisciplinary goals of care discussion [12/31 post arrest]    The patient is critically ill with multiple organ systems failure and requires high complexity  decision making for assessment and support, frequent evaluation and titration of therapies, application of advanced monitoring technologies and extensive interpretation of multiple databases.  Independent Critical Care Time: 45 Minutes.   Slater Staff, M.D. [facility] Pulmonary/Critical Care Medicine 01/18/2023 10:29 AM   Please see Amion for pager number to reach on-call Pulmonary and Critical Care Team.

## 2023-01-18 NOTE — Plan of Care (Signed)
  Problem: Coping: Goal: Level of anxiety will decrease Outcome: Progressing   Problem: Health Behavior/Discharge Planning: Goal: Ability to manage health-related needs will improve Outcome: Not Progressing   Problem: Clinical Measurements: Goal: Ability to maintain clinical measurements within normal limits will improve Outcome: Not Progressing Goal: Respiratory complications will improve Outcome: Not Progressing Goal: Cardiovascular complication will be avoided Outcome: Not Progressing   Problem: Activity: Goal: Risk for activity intolerance will decrease Outcome: Not Progressing

## 2023-01-18 NOTE — Progress Notes (Signed)
 PHARMACY - ANTICOAGULATION CONSULT NOTE  Pharmacy Consult for Heparin  Indication: r/o PE; hx right internal jugular thrombus (previously on apixaban , but not currently)  No Known Allergies  Patient Measurements: Height: 5' 5 (165.1 cm) Weight: 41.2 kg (90 lb 13.3 oz) IBW/kg (Calculated) : 57 Heparin  Dosing Weight: actual body weight  Vital Signs: Temp: 98.1 F (36.7 C) (01/01 1100) Temp Source: Bladder (01/01 0800) BP: 98/47 (01/01 1100) Pulse Rate: 120 (01/01 0405)  Labs: Recent Labs    01/16/23 1028 01/16/23 1028 01/16/23 2356 01/17/23 0056 01/17/23 0459 01/17/23 0503 01/17/23 0918 01/17/23 1138 01/17/23 1138 01/17/23 1759 01/18/23 0155 01/18/23 0737 01/18/23 1026 01/18/23 1118  HGB 8.4*  --   --  7.8*  --  7.8*  --   --   --   --   --  7.3*  --   --   HCT 27.8*  --   --  27.7*  --  26.9*  --   --   --   --   --  25.0*  --   --   PLT 415*  --   --  363  --  356  --   --   --   --   --  334  --   --   APTT  --   --  31  --   --   --   --   --   --   --   --   --   --   --   LABPROT 14.5  --   --   --   --  16.4*  --   --   --   --   --   --   --   --   INR 1.1  --   --   --   --  1.3*  --   --   --   --   --   --   --   --   HEPARINUNFRC  --   --   --   --   --   --   --  0.11*   < > 0.17* 0.34  --  0.19*  --   CREATININE 0.62  --   --  0.86  --  0.70 0.71  --   --  0.77  --  0.93  --  0.84  TROPONINIHS  --    < >  --  81* 125*  --  104* 101*  --   --   --   --   --   --    < > = values in this interval not displayed.    Estimated Creatinine Clearance: 37.1 mL/min (by C-G formula based on SCr of 0.84 mg/dL).   Medical History: Past Medical History:  Diagnosis Date   COPD (chronic obstructive pulmonary disease) with chronic bronchitis (HCC)    HTN (hypertension)    Pneumothorax     Medications:  No oral anticoagulation PTA (Previously on apixaban , which patient reports was discontinued by provider; no recent refill history for apixaban   noted)  Assessment: 78 yr female admitted 12/30 with sepsis due to PNA. PMH significant for stave IV NSCLC, COPD, right internal jugular thrombus, anemia of chronic illness, s/p ORIF left hip fracture Nov 2024.  Patient s/p respiratory arrest 12/31 and now intubated.  1/1 CTAngio = no definite evidence of PE  01/18/23 Heparin  level at 0155 therapeutic but level drawn at 1026 is now SUBtherapeutic on  IV heparin  rate of 800 units/hr CBC: Hgb 7.3 low but stable. Plts WNL  Per RN, no bleeding or issues  Goal of Therapy:  Heparin  level 0.3-0.7 units/ml Monitor platelets by anticoagulation protocol: Yes   Plan:  Increase IV heparin  from 800 to 1000 units/hr Recheck heparin  level in 8 hr Check daily heparin  level & CBC   Britta Eva Na, PharmD Clinical Pharmacist 01/18/2023 1:15 PM

## 2023-01-18 NOTE — Progress Notes (Signed)
 DIAGNOSIS: Stage IV (T3, N2, M1 C) non-small cell lung cancer, adenocarcinoma presented with large right lower lobe lung mass in addition to subcarinal lymphadenopathy as well as bone metastasis diagnosed in April 2023   Molecular studies by foundation 1 showed EGFR T790M, Exon 19 deletion ([id number]) AKT2- Aplification-equivocal ARID1A- [id number]*1 RICTOR- amplification-equivocal   PDL1 Expression 50%   PRIOR THERAPY: Palliative radiotherapy to the metastatic T3 bone lesion under the care of Dr. Dewey   CURRENT THERAPY: Tagrisso  80 mg p.o. daily.  First dose started May 22, 2021.  Status post 19 months of treatment. Subjective: The patient is seen and examined today.  She is currently intubated and sedated after recent cardiac arrest after admission with shortness of breath.  I spoke to her family who were available at the waiting room about her condition and future treatment plans.  The patient was admitted to the hospital on January 16, 2023 with significant shortness of breath and imaging studies of the chest showed right-sided infiltrate with possible effusion.  She was admitted for treatment of pneumonia with ceftriaxone  and azithromycin .  The patient had a respiratory arrest and CODE BLUE was called and the patient was intubated and moved to the medical ICU.  There is a trial for extubation today by the critical care team.  She had CT angiogram of the chest yesterday and that showed no evidence for pulmonary embolism and there was interval development of multiple bilateral and minimally displaced rib fractures likely after the CODE BLUE.  There was moderate size right pleural effusion noted with adjacent subsegmental atelectasis in the right upper and lower lobes with significantly increased right perihilar opacity suggesting worsening pneumonia or atelectasis but worsening malignancy could not be also excluded.  The scan also showed small left pleural effusion.  Her treatment with Tagrisso  is  currently on hold after this admission.  Objective: Vital signs in last 24 hours: Temp:  [96.3 F (35.7 C)-98.4 F (36.9 C)] 98.2 F (36.8 C) (01/01 1400) Pulse Rate:  [84-120] 120 (01/01 0405) Resp:  [13-25] 18 (01/01 1400) BP: (75-143)/(33-82) 95/64 (01/01 1400) SpO2:  [95 %-100 %] 100 % (01/01 1452) FiO2 (%):  [30 %] 30 % (01/01 1452) Weight:  [90 lb 13.3 oz (41.2 kg)] 90 lb 13.3 oz (41.2 kg) (01/01 0708)  Intake/Output from previous day: 12/31 0701 - 01/01 0700 In: 1824.8 [I.V.:792.1; NG/GT:335.2; IV Piggyback:697.5] Out: 495 [Urine:495] Intake/Output this shift: Total I/O In: 698.4 [I.V.:187.4; NG/GT:116.5; IV Piggyback:394.5] Out: -   General appearance: Sedated and intubated Resp: rales RLL Cardio: regular rate and rhythm, S1, S2 normal, no murmur, click, rub or gallop GI: soft, non-tender; bowel sounds normal; no masses,  no organomegaly Extremities: extremities normal, atraumatic, no cyanosis or edema  Lab Results:  Recent Labs    01/17/23 0503 01/18/23 0737  WBC 9.1 11.5*  HGB 7.8* 7.3*  HCT 26.9* 25.0*  PLT 356 334   BMET Recent Labs    01/18/23 0737 01/18/23 1118  NA 134* 133*  K 3.6 3.6  CL 100 100  CO2 24 23  GLUCOSE 167* 155*  BUN 19 20  CREATININE 0.93 0.84  CALCIUM  8.5* 8.1*    Studies/Results: CT Angio Chest Pulmonary Embolism (PE) W or WO Contrast Result Date: 01/17/2023 CLINICAL DATA:  History of right internal jugular thrombus. History of right-sided lung cancer. Cardiac arrest. EXAM: CT ANGIOGRAPHY CHEST WITH CONTRAST TECHNIQUE: Multidetector CT imaging of the chest was performed using the standard protocol during bolus administration of intravenous contrast. Multiplanar  CT image reconstructions and MIPs were obtained to evaluate the vascular anatomy. RADIATION DOSE REDUCTION: This exam was performed according to the departmental dose-optimization program which includes automated exposure control, adjustment of the mA and/or kV according  to patient size and/or use of iterative reconstruction technique. CONTRAST:  75mL OMNIPAQUE  IOHEXOL  350 MG/ML SOLN COMPARISON:  December 21, 2022. FINDINGS: Cardiovascular: Satisfactory opacification of the pulmonary arteries to the segmental level. No evidence of pulmonary embolism. Normal heart size. No pericardial effusion. Mediastinum/Nodes: Endotracheal tube is in grossly good position. Nasogastric tube is seen passing through esophagus and into stomach. Small sliding-type hiatal hernia is noted. No definite adenopathy is noted. Thyroid  gland is unremarkable. Lungs/Pleura: No pneumothorax is noted. Emphysematous disease is noted. Small left pleural effusion is noted with minimal adjacent subsegmental atelectasis. Moderate size right pleural effusion is noted with adjacent subsegmental atelectasis of the right upper and lower lobes. Significantly increased right perihilar opacity is noted suggesting worsening pneumonia or atelectasis or possibly worsening malignancy. Right lower lobe nodule noted on prior exam is not well visualized as it is surrounded by this opacity. Upper Abdomen: No acute abnormality. Musculoskeletal: Multiple bilateral minimally displaced rib fractures are noted laterally. Severe degenerative changes seen involving the right glenohumeral joint. Stable old sclerotic T3 compression fracture is noted which may be pathologic. Review of the MIP images confirms the above findings. IMPRESSION: No definite evidence of pulmonary embolus. Interval development of multiple bilateral minimally displaced rib fractures. Moderate size right pleural effusion is now noted with adjacent subsegmental atelectasis of the right upper and lower lobes. Significantly increased right perihilar opacity is noted suggesting worsening pneumonia or atelectasis or possibly worsening malignancy. The right lower lobe nodule noted on prior exam is not well visualized currently due to this opacity. Small left pleural effusion  is noted with minimal adjacent subsegmental atelectasis. Endotracheal and nasogastric tubes are in grossly good position. Aortic Atherosclerosis (ICD10-I70.0) and Emphysema (ICD10-J43.9). Electronically Signed   By: Lynwood Landy Raddle M.D.   On: 01/17/2023 15:38   ECHOCARDIOGRAM COMPLETE Result Date: 01/17/2023    ECHOCARDIOGRAM REPORT   Patient Name:   Anna Finley Date of Exam: 01/17/2023 Medical Rec #:  [id number]      Height:       65.0 in Accession #:    [id number]     Weight:       88.0 lb Date of Birth:  07-06-1945       BSA:          1.395 m Patient Age:    77 years       BP:           100/58 mmHg Patient Gender: F              HR:           89 bpm. Exam Location:  Inpatient Procedure: 2D Echo, Cardiac Doppler, Color Doppler and Intracardiac            Opacification Agent Indications:    CHF- Acute Diastolic  History:        Patient has no prior history of Echocardiogram examinations.                 Risk Factors:Former Smoker.  Sonographer:    Ozell Free Referring Phys: [id number] OMAR CHRISTELLA PLY  Sonographer Comments: Technically challenging study due to limited acoustic windows and echo performed with patient supine and on artificial respirator. Image acquisition challenging due to patient body habitus. IMPRESSIONS  1. Technically  difficult study with limited visualization of cardiac structures.  2. Left ventricular ejection fraction, by estimation, is 30 to 35%. The left ventricle has moderately decreased function. The left ventricle demonstrates global hypokinesis. Left ventricular diastolic parameters are consistent with Grade II diastolic dysfunction (pseudonormalization).  3. Right ventricular systolic function is mildly reduced. The right ventricular size is normal. There is normal pulmonary artery systolic pressure.  4. Left atrial size was moderately dilated.  5. The mitral valve is normal in structure. Trivial mitral valve regurgitation. No evidence of mitral stenosis.  6. The aortic valve is  grossly normal. Aortic valve regurgitation is not visualized. No aortic stenosis is present.  7. The inferior vena cava is normal in size with greater than 50% respiratory variability, suggesting right atrial pressure of 3 mmHg. FINDINGS  Left Ventricle: Left ventricular ejection fraction, by estimation, is 30 to 35%. The left ventricle has moderately decreased function. The left ventricle demonstrates global hypokinesis. Definity  contrast agent was given IV to delineate the left ventricular endocardial borders. The left ventricular internal cavity size was normal in size. There is no left ventricular hypertrophy. Left ventricular diastolic parameters are consistent with Grade II diastolic dysfunction (pseudonormalization). Right Ventricle: The right ventricular size is normal. No increase in right ventricular wall thickness. Right ventricular systolic function is mildly reduced. There is normal pulmonary artery systolic pressure. The tricuspid regurgitant velocity is 2.46 m/s, and with an assumed right atrial pressure of 3 mmHg, the estimated right ventricular systolic pressure is 27.2 mmHg. Left Atrium: Left atrial size was moderately dilated. Right Atrium: Right atrial size was normal in size. Pericardium: There is no evidence of pericardial effusion. Mitral Valve: The mitral valve is normal in structure. Trivial mitral valve regurgitation. No evidence of mitral valve stenosis. MV peak gradient, 8.2 mmHg. The mean mitral valve gradient is 3.0 mmHg. Tricuspid Valve: The tricuspid valve is not well visualized. Tricuspid valve regurgitation is not demonstrated. No evidence of tricuspid stenosis. Aortic Valve: The aortic valve is grossly normal. Aortic valve regurgitation is not visualized. No aortic stenosis is present. Aortic valve mean gradient measures 3.0 mmHg. Aortic valve peak gradient measures 5.0 mmHg. Aortic valve area, by VTI measures 1.67 cm. Pulmonic Valve: The pulmonic valve was not well visualized.  Pulmonic valve regurgitation is not visualized. No evidence of pulmonic stenosis. Aorta: The aortic root is normal in size and structure. Venous: The inferior vena cava is normal in size with greater than 50% respiratory variability, suggesting right atrial pressure of 3 mmHg. IAS/Shunts: No atrial level shunt detected by color flow Doppler.  LEFT VENTRICLE PLAX 2D LVIDd:         4.60 cm      Diastology LVIDs:         3.80 cm      LV e' medial:    3.37 cm/s LV PW:         0.90 cm      LV E/e' medial:  33.5 LV IVS:        0.90 cm      LV e' lateral:   4.79 cm/s LVOT diam:     2.00 cm      LV E/e' lateral: 23.6 LV SV:         40 LV SV Index:   29 LVOT Area:     3.14 cm  LV Volumes (MOD) LV vol d, MOD A2C: 112.0 ml LV vol d, MOD A4C: 111.0 ml LV vol s, MOD A2C: 75.9 ml LV vol  s, MOD A4C: 72.8 ml LV SV MOD A2C:     36.1 ml LV SV MOD A4C:     111.0 ml LV SV MOD BP:      40.1 ml RIGHT VENTRICLE            IVC RV Basal diam:  4.10 cm    IVC diam: 1.90 cm RV S prime:     5.98 cm/s TAPSE (M-mode): 1.5 cm LEFT ATRIUM             Index        RIGHT ATRIUM           Index LA diam:        4.10 cm 2.94 cm/m   RA Area:     13.20 cm LA Vol (A2C):   84.8 ml 60.78 ml/m  RA Volume:   35.40 ml  25.37 ml/m LA Vol (A4C):   75.4 ml 54.04 ml/m LA Biplane Vol: 83.7 ml 59.99 ml/m  AORTIC VALVE AV Area (Vmax):    1.88 cm AV Area (Vmean):   1.64 cm AV Area (VTI):     1.67 cm AV Vmax:           112.00 cm/s AV Vmean:          82.300 cm/s AV VTI:            0.239 m AV Peak Grad:      5.0 mmHg AV Mean Grad:      3.0 mmHg LVOT Vmax:         67.20 cm/s LVOT Vmean:        42.900 cm/s LVOT VTI:          0.127 m LVOT/AV VTI ratio: 0.53  AORTA Ao Root diam: 2.50 cm MITRAL VALVE                TRICUSPID VALVE MV Area (PHT): 4.96 cm     TR Peak grad:   24.2 mmHg MV Area VTI:   1.61 cm     TR Vmax:        246.00 cm/s MV Peak grad:  8.2 mmHg MV Mean grad:  3.0 mmHg     SHUNTS MV Vmax:       1.43 m/s     Systemic VTI:  0.13 m MV Vmean:      82.4  cm/s    Systemic Diam: 2.00 cm MV Decel Time: 153 msec MV E velocity: 113.00 cm/s MV A velocity: 77.10 cm/s MV E/A ratio:  1.47 Aditya Sabharwal Electronically signed by Ria Commander Signature Date/Time: 01/17/2023/9:38:42 AM    Final    DG Abd 1 View Result Date: 01/17/2023 CLINICAL DATA:  OG tube placement. EXAM: ABDOMEN - 1 VIEW COMPARISON:  None Available. FINDINGS: Tip and side port of the enteric tube below the diaphragm in the stomach. Right aspect of the abdomen is not included in the field of view. No dilated bowel in the visualized abdomen. Calcified uterine fibroids IMPRESSION: Tip and side port of the enteric tube below the diaphragm in the stomach. Electronically Signed   By: Andrea Gasman M.D.   On: 01/17/2023 03:37   DG CHEST PORT 1 VIEW Result Date: 01/17/2023 CLINICAL DATA:  Check endotracheal tube placement EXAM: PORTABLE CHEST 1 VIEW COMPARISON:  Film from the previous day. FINDINGS: Endotracheal tube is noted in satisfactory position 2 cm above the carina. Cardiac shadow is stable. Persistent right basilar airspace opacity is noted. No sizable effusion is noted.  Skin fold is noted over the left chest. No acute bony abnormality is seen. IMPRESSION: Persistent right basilar opacity. Endotracheal tube in satisfactory position. Electronically Signed   By: Oneil Devonshire M.D.   On: 01/17/2023 00:46    Medications: I have reviewed the patient's current medications.   Assessment/Plan: This is a very pleasant 78 years old African-American female with Stage IV (T3, N2, M1 C) non-small cell lung cancer, adenocarcinoma presented with large right lower lobe lung mass in addition to subcarinal lymphadenopathy as well as bone metastasis diagnosed in April 2023  Molecular studies by foundation 1 showed EGFR T790M, Exon 19 deletion ([id number])  She has been on treatment with Tagrisso  80 mg p.o. daily for around 19 months.  The patient was supposed to have a follow-up visit after her  imaging studies in October 2024.  She missed her appointment because of hospitalization for hip fracture. She was admitted to the hospital with significant shortness of breath secondary to pneumonia and right pleural effusion and unfortunately the patient had respiratory arrest and had to be intubated. I agree with the current supportive care by the critical care team. I would also continue to hold her treatment with Tagrisso  for now until improvement of her condition.  Tagrisso  can cause QT prolongation and I will hold it for now until improvement of her condition before resuming it. I discussed her condition with the family and I will arrange a follow-up appointment for her after discharge for more detailed discussion of her treatment options. Thank you so much for taking good care of Mr. Claus.  Please call if you have any questions.   LOS: 2 days    Sherrod MARLA Sherrod 01/18/2023

## 2023-01-18 NOTE — Progress Notes (Signed)
 PHARMACY - ANTICOAGULATION CONSULT NOTE  Pharmacy Consult for Heparin  Indication: r/o PE; hx right internal jugular thrombus (previously on apixaban , but not currently)  No Known Allergies  Patient Measurements: Height: 5' 5 (165.1 cm) Weight: 39.9 kg (87 lb 15.4 oz) IBW/kg (Calculated) : 57 Heparin  Dosing Weight: actual body weight  Vital Signs: Temp: 96.6 F (35.9 C) (12/31 2320) Temp Source: Oral (12/31 2255) BP: 97/53 (12/31 2031) Pulse Rate: 114 (12/31 2320)  Labs: Recent Labs    01/16/23 1028 01/16/23 1028 01/16/23 2356 01/17/23 0056 01/17/23 0459 01/17/23 0503 01/17/23 0918 01/17/23 1138 01/17/23 1759 01/18/23 0155  HGB 8.4*  --   --  7.8*  --  7.8*  --   --   --   --   HCT 27.8*  --   --  27.7*  --  26.9*  --   --   --   --   PLT 415*  --   --  363  --  356  --   --   --   --   APTT  --   --  31  --   --   --   --   --   --   --   LABPROT 14.5  --   --   --   --  16.4*  --   --   --   --   INR 1.1  --   --   --   --  1.3*  --   --   --   --   HEPARINUNFRC  --   --   --   --   --   --   --  0.11* 0.17* 0.34  CREATININE 0.62  --   --  0.86  --  0.70 0.71  --  0.77  --   TROPONINIHS  --    < >  --  81* 125*  --  104* 101*  --   --    < > = values in this interval not displayed.    Estimated Creatinine Clearance: 37.7 mL/min (by C-G formula based on SCr of 0.77 mg/dL).   Medical History: Past Medical History:  Diagnosis Date   COPD (chronic obstructive pulmonary disease) with chronic bronchitis (HCC)    HTN (hypertension)    Pneumothorax     Medications:  No oral anticoagulation PTA (Previously on apixaban , which patient reports was discontinued by provider; no recent refill history for apixaban  noted)  Assessment: 78 yr female admitted 12/30 with sepsis due to PNA. PMH significant for stave IV NSCLC, COPD, right internal jugular thrombus, anemia of chronic illness, s/p ORIF left hip fracture Nov 2024.  Patient s/p respiratory arrest 12/31 and now  intubated.  1/1 CTAngio = no definite evidence of PE  01/18/23 -Heparin  level 0.34 - therapeutic on heparin  infusion at 800 units/hr -No complications of therapy noted  Goal of Therapy:  Heparin  level 0.3-0.7 units/ml Monitor platelets by anticoagulation protocol: Yes   Plan:  -Continue heparin  infusion @ 800 units/hr -Recheck heparin  level in 8 hr  to confirm therapeutic dose -Check daily heparin  level & CBC   Jammy Stlouis, Arvin Fletcher, PharmD Clinical Pharmacist 01/18/2023 2:21 AM

## 2023-01-18 NOTE — Progress Notes (Signed)
 PHARMACY - ANTICOAGULATION CONSULT NOTE  Pharmacy Consult for Heparin  Indication: r/o PE; hx right internal jugular thrombus (previously on apixaban , but not currently)  No Known Allergies  Patient Measurements: Height: 5' 5 (165.1 cm) Weight: 41.2 kg (90 lb 13.3 oz) IBW/kg (Calculated) : 57 Heparin  Dosing Weight: actual body weight  Vital Signs: Temp: 98.6 F (37 C) (01/01 2001) Temp Source: Bladder (01/01 1600) BP: 100/53 (01/01 2001) Pulse Rate: 98 (01/01 2001)  Labs: Recent Labs    01/16/23 1028 01/16/23 1028 01/16/23 2356 01/17/23 0056 01/17/23 0459 01/17/23 0503 01/17/23 0918 01/17/23 1138 01/17/23 1138 01/17/23 1759 01/18/23 0155 01/18/23 0737 01/18/23 1026 01/18/23 1118 01/18/23 2139  HGB 8.4*  --   --  7.8*  --  7.8*  --   --   --   --   --  7.3*  --   --   --   HCT 27.8*  --   --  27.7*  --  26.9*  --   --   --   --   --  25.0*  --   --   --   PLT 415*  --   --  363  --  356  --   --   --   --   --  334  --   --   --   APTT  --   --  31  --   --   --   --   --   --   --   --   --   --   --   --   LABPROT 14.5  --   --   --   --  16.4*  --   --   --   --   --   --   --   --   --   INR 1.1  --   --   --   --  1.3*  --   --   --   --   --   --   --   --   --   HEPARINUNFRC  --   --   --   --   --   --   --  0.11*   < > 0.17* 0.34  --  0.19*  --  0.44  CREATININE 0.62  --   --  0.86  --  0.70 0.71  --   --  0.77  --  0.93  --  0.84  --   TROPONINIHS  --    < >  --  81* 125*  --  104* 101*  --   --   --   --   --   --   --    < > = values in this interval not displayed.    Estimated Creatinine Clearance: 37.1 mL/min (by C-G formula based on SCr of 0.84 mg/dL).   Medical History: Past Medical History:  Diagnosis Date   COPD (chronic obstructive pulmonary disease) with chronic bronchitis (HCC)    HTN (hypertension)    Pneumothorax     Medications:  No oral anticoagulation PTA (Previously on apixaban , which patient reports was discontinued by  provider; no recent refill history for apixaban  noted)  Assessment: 78 yr female admitted 12/30 with sepsis due to PNA. PMH significant for stave IV NSCLC, COPD, right internal jugular thrombus, anemia of chronic illness, s/p ORIF left hip fracture Nov 2024.  Patient s/p respiratory arrest 12/31 and  now intubated.  1/1 CTAngio = no definite evidence of PE  01/18/23 Heparin  level = 0.44 (therapeutic) with heparin  gtt @  1000 units/hr AM CBC: Hgb 7.3 low but stable. Plts WNL  No complications of therapy noted  Goal of Therapy:  Heparin  level 0.3-0.7 units/ml Monitor platelets by anticoagulation protocol: Yes   Plan:  Continue IV heparin  @ 1000 units/hr Recheck heparin  level with AM labs to confirm therapeutic dose Check daily heparin  level & CBC   Zachry Hopfensperger, Arvin Fletcher, PharmD Clinical Pharmacist 01/18/2023 11:15 PM

## 2023-01-19 ENCOUNTER — Inpatient Hospital Stay (HOSPITAL_COMMUNITY): Payer: 59

## 2023-01-19 ENCOUNTER — Other Ambulatory Visit (HOSPITAL_COMMUNITY): Payer: Self-pay

## 2023-01-19 DIAGNOSIS — C3431 Malignant neoplasm of lower lobe, right bronchus or lung: Secondary | ICD-10-CM

## 2023-01-19 DIAGNOSIS — R579 Shock, unspecified: Secondary | ICD-10-CM

## 2023-01-19 DIAGNOSIS — J690 Pneumonitis due to inhalation of food and vomit: Secondary | ICD-10-CM

## 2023-01-19 DIAGNOSIS — R9431 Abnormal electrocardiogram [ECG] [EKG]: Secondary | ICD-10-CM

## 2023-01-19 LAB — CULTURE, RESPIRATORY W GRAM STAIN: Culture: NORMAL

## 2023-01-19 LAB — COMPREHENSIVE METABOLIC PANEL
ALT: 27 U/L (ref 0–44)
AST: 26 U/L (ref 15–41)
Albumin: 2.3 g/dL — ABNORMAL LOW (ref 3.5–5.0)
Alkaline Phosphatase: 126 U/L (ref 38–126)
Anion gap: 8 (ref 5–15)
BUN: 22 mg/dL (ref 8–23)
CO2: 24 mmol/L (ref 22–32)
Calcium: 8 mg/dL — ABNORMAL LOW (ref 8.9–10.3)
Chloride: 98 mmol/L (ref 98–111)
Creatinine, Ser: 0.88 mg/dL (ref 0.44–1.00)
GFR, Estimated: >60 mL/min (ref >60)
Glucose, Bld: 112 mg/dL — ABNORMAL HIGH (ref 70–99)
Potassium: 3.2 mmol/L — ABNORMAL LOW (ref 3.5–5.1)
Sodium: 130 mmol/L — ABNORMAL LOW (ref 135–145)
Total Bilirubin: 0.3 mg/dL (ref 0.0–1.2)
Total Protein: 5.7 g/dL — ABNORMAL LOW (ref 6.5–8.1)

## 2023-01-19 LAB — GLUCOSE, CAPILLARY
Glucose-Capillary: 122 mg/dL — ABNORMAL HIGH (ref 70–99)
Glucose-Capillary: 120 mg/dL — ABNORMAL HIGH (ref 70–99)
Glucose-Capillary: 118 mg/dL — ABNORMAL HIGH (ref 70–99)
Glucose-Capillary: 137 mg/dL — ABNORMAL HIGH (ref 70–99)
Glucose-Capillary: 210 mg/dL — ABNORMAL HIGH (ref 70–99)
Glucose-Capillary: 154 mg/dL — ABNORMAL HIGH (ref 70–99)
Glucose-Capillary: 150 mg/dL — ABNORMAL HIGH (ref 70–99)

## 2023-01-19 LAB — CBC
HCT: 23.4 % — ABNORMAL LOW (ref 36.0–46.0)
Hemoglobin: 7.1 g/dL — ABNORMAL LOW (ref 12.0–15.0)
MCH: 27.6 pg (ref 26.0–34.0)
MCHC: 30.3 g/dL (ref 30.0–36.0)
MCV: 91.1 fL (ref 80.0–100.0)
Platelets: 322 10*3/uL (ref 150–400)
RBC: 2.57 MIL/uL — ABNORMAL LOW (ref 3.87–5.11)
RDW: 17.3 % — ABNORMAL HIGH (ref 11.5–15.5)
WBC: 11 10*3/uL — ABNORMAL HIGH (ref 4.0–10.5)
nRBC: 0 % (ref 0.0–0.2)

## 2023-01-19 LAB — MAGNESIUM: Magnesium: 2.3 mg/dL (ref 1.7–2.4)

## 2023-01-19 LAB — CALCIUM, IONIZED: Calcium, Ionized, Serum: 4.8 mg/dL (ref 4.5–5.6)

## 2023-01-19 LAB — HEPARIN LEVEL (UNFRACTIONATED): Heparin Unfractionated: 0.45 [IU]/mL (ref 0.30–0.70)

## 2023-01-19 MED ORDER — IPRATROPIUM-ALBUTEROL 0.5-2.5 (3) MG/3ML IN SOLN
3.0000 mL | Freq: Four times a day (QID) | RESPIRATORY_TRACT | Status: DC | PRN
  Administered 2023-01-21: 3 mL via RESPIRATORY_TRACT
  Filled 2023-01-19: qty 3

## 2023-01-19 MED ORDER — SODIUM CHLORIDE 3 % IN NEBU
4.0000 mL | INHALATION_SOLUTION | Freq: Two times a day (BID) | RESPIRATORY_TRACT | Status: AC
  Administered 2023-01-19 – 2023-01-22 (×6): 4 mL via RESPIRATORY_TRACT
  Filled 2023-01-19 (×6): qty 4

## 2023-01-19 MED ORDER — REVEFENACIN 175 MCG/3ML IN SOLN
175.0000 ug | Freq: Every day | RESPIRATORY_TRACT | Status: DC
  Administered 2023-01-19 – 2023-01-27 (×9): 175 ug via RESPIRATORY_TRACT
  Filled 2023-01-19 (×8): qty 3

## 2023-01-19 MED ORDER — POTASSIUM CHLORIDE 20 MEQ PO PACK
40.0000 meq | PACK | Freq: Once | ORAL | Status: AC
  Administered 2023-01-19: 40 meq
  Filled 2023-01-19: qty 2

## 2023-01-19 MED ORDER — ARFORMOTEROL TARTRATE 15 MCG/2ML IN NEBU
15.0000 ug | INHALATION_SOLUTION | Freq: Two times a day (BID) | RESPIRATORY_TRACT | Status: DC
  Administered 2023-01-19 – 2023-01-27 (×17): 15 ug via RESPIRATORY_TRACT
  Filled 2023-01-19 (×17): qty 2

## 2023-01-19 MED ORDER — POTASSIUM CHLORIDE 10 MEQ/100ML IV SOLN
10.0000 meq | INTRAVENOUS | Status: AC
  Administered 2023-01-19 (×2): 10 meq via INTRAVENOUS
  Filled 2023-01-19 (×2): qty 100

## 2023-01-19 MED ORDER — BUDESONIDE 0.5 MG/2ML IN SUSP
0.5000 mg | Freq: Two times a day (BID) | RESPIRATORY_TRACT | Status: DC
  Administered 2023-01-19 – 2023-01-27 (×17): 0.5 mg via RESPIRATORY_TRACT
  Filled 2023-01-19 (×17): qty 2

## 2023-01-19 NOTE — Progress Notes (Signed)
Heart Failure Navigator Progress Note  Assessed for Heart & Vascular TOC clinic readiness.  Patient does not meet criteria due to history of metastatic cancer. .   Navigator will sign off at this time.   Rhae Hammock, BSN, Scientist, clinical (histocompatibility and immunogenetics) Only

## 2023-01-19 NOTE — Progress Notes (Signed)
 PHARMACY - ANTICOAGULATION CONSULT NOTE  Pharmacy Consult for Heparin  Indication: r/o PE; hx right internal jugular thrombus (previously on apixaban , but not currently)  No Known Allergies  Patient Measurements: Height: 5' 5 (165.1 cm) Weight: 44.4 kg (97 lb 14.2 oz) IBW/kg (Calculated) : 57 Heparin  Dosing Weight: actual body weight  Vital Signs: Temp: 98.6 F (37 C) (01/02 0700) Temp Source: Bladder (01/02 0400) BP: 116/60 (01/02 0700) Pulse Rate: 104 (01/02 0700)  Labs: Recent Labs    01/16/23 1028 01/16/23 2356 01/17/23 0056 01/17/23 0459 01/17/23 0503 01/17/23 0918 01/17/23 1138 01/17/23 1759 01/18/23 0737 01/18/23 1026 01/18/23 1118 01/18/23 2139 01/19/23 0551  HGB 8.4*  --    < >  --  7.8*  --   --   --  7.3*  --   --   --  7.1*  HCT 27.8*  --    < >  --  26.9*  --   --   --  25.0*  --   --   --  23.4*  PLT 415*  --    < >  --  356  --   --   --  334  --   --   --  322  APTT  --  31  --   --   --   --   --   --   --   --   --   --   --   LABPROT 14.5  --   --   --  16.4*  --   --   --   --   --   --   --   --   INR 1.1  --   --   --  1.3*  --   --   --   --   --   --   --   --   HEPARINUNFRC  --   --   --   --   --   --  0.11*   < >  --  0.19*  --  0.44 0.45  CREATININE 0.62  --    < >  --  0.70 0.71  --    < > 0.93  --  0.84  --  0.88  TROPONINIHS  --   --    < > 125*  --  104* 101*  --   --   --   --   --   --    < > = values in this interval not displayed.    Estimated Creatinine Clearance: 38.1 mL/min (by C-G formula based on SCr of 0.88 mg/dL).   Medical History: Past Medical History:  Diagnosis Date   COPD (chronic obstructive pulmonary disease) with chronic bronchitis (HCC)    HTN (hypertension)    Pneumothorax     Medications:  No oral anticoagulation PTA (Previously on apixaban , which patient reports was discontinued by provider; no recent refill history for apixaban  noted)  Assessment: 78 yr female admitted 12/30 with sepsis due to PNA.  PMH significant for stave IV NSCLC, COPD, right internal jugular thrombus, anemia of chronic illness, s/p ORIF left hip fracture Nov 2024.  Patient s/p respiratory arrest 12/31 and now intubated.  1/1 CTAngio = no definite evidence of PE  01/19/23 Heparin  level therapeutic x 2 now on current IV heparin  rate AM CBC: Hgb 7.1 trending down slowly. Plts WNL  No complications of therapy noted  Goal of Therapy:  Heparin  level 0.3-0.7  units/ml Monitor platelets by anticoagulation protocol: Yes   Plan:  Continue IV heparin  @ 1000 units/hr Check daily heparin  level & CBC   Eva CHRISTELLA Allis, PharmD, BCPS Secure Chat if ?s 01/19/2023 7:21 AM

## 2023-01-19 NOTE — Progress Notes (Signed)
 eLink Physician-Brief Progress Note Patient Name: Anna Finley DOB: February 24, 1945 MRN: [id number]   Date of Service  01/19/2023  HPI/Events of Note  78 year old with non-small cell lung cancer stage IVb, COPD, right IJ thrombus. Left hip fracture s/p ORIF 11/29. Post respiratory arrest moved to ICU   Having increased wheezing and concern for stridor  eICU Interventions  Obtain chest radiograph.  As needed DuoNebs in place.     Intervention Category Intermediate Interventions: Respiratory distress - evaluation and management  Kenlei Safi 01/19/2023, 9:45 PM

## 2023-01-19 NOTE — Progress Notes (Signed)
 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 She was dx with RLL PNA and started on Zithromax  and Rocephin . She has hypoK and being replaced.  Today, she had resp arrest and code blue was called and she achieved ROSC after one min, intubated and moved to ICU. She quit smoking 2012, smoked 62 PY.   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 CTA chest to r/o PE was recommended by accepting [facility] physician but not obtained. Admitted to [facility] to [facility]  12/31 cardiac arrest --variable reports of time until ROSC -- 6 to 15 min.  At one point shockable, was DF. Prolonged qtc and hypokalemic, some NSVT.  CTA chest still pending.  1/1 failed extubation after sedation   Interim History / Subjective:  PSV with Vt mid 300s   Objective   Blood pressure 118/74, pulse (!) 122, temperature 99.3 F (37.4 C), resp. rate 17, height 5' 5 (1.651 m), weight 44.4 kg, SpO2 99%.    Vent Mode: PRVC FiO2 (%):  [30 %] 30 % Set Rate:  [16 bmp] 16 bmp Vt Set:  [340 mL] 340 mL PEEP:  [5 cmH20] 5 cmH20 Pressure Support:  [5 cmH20] 5 cmH20 Plateau Pressure:  [13 cmH20-16 cmH20] 16 cmH20   Intake/Output Summary (Last 24 hours) at 01/19/2023 1127 Last data filed at 01/19/2023 1051 Gross per 24 hour   Intake 2164.86 ml  Output 933 ml  Net 1231.86 ml   Filed Weights   01/17/23 0200 01/18/23 0708 01/19/23 0500  Weight: 39.9 kg 41.2 kg 44.4 kg   Physical Exam: General: Frail, cachectic elderly F NAD intubated  HENT: NCAT pink mm anicteric sclera ETT secure  Respiratory: Faint expiratory wheeze bilaterally  Cardiovascular: rr cap refill < 3 sec  GI: soft ndnt  Extremities: no acute joint deformity. BUE  mittens   Neuro: Awake, following commands. Answering yes/no with nods/shaking head. Trying to mouth words  GU: foley    Resolved Hospital Problem list   Shock Lactic acidosis  Hypermagnesemia   Assessment & Plan:   In-hospital cardiac arrest HFrEF, unclear chronicity Prolonged qtc NSVT  -this is documented as a respiratory arrest (was coughing and found hypoxic) but with prolonged qtc and lyte abnormalities as well as question of VF/torsades peri-arrest, post arrest NSVT, query if this was not resp arrest -has a decent neuro exam post ROSC  -there are variable reports of time until ROSC, up to 15 minutes.  -Echo reviewed with systolic heart failure. No prior echos available -BNP elevated 1272 -looking at her 12/21/22 [facility] presentation, qtc was 483. Noted to have some new PVCs. Was also hypoK (3.3)  -here qtc has ranged 530 - 578.  P -optimize lytes (K ordered 1/2)  -hep gtt  -avoid qt prolonging agents; dc azithro, change to doxy,  holding tagrisso    -follow qt -Will likely need Cardiology consult this admission, waiting for her to level out a bit   Acute resp failure with hypoxia RLL PNA- aspiration  Right pleural effusion Stg IVb NSCLC - on tagrisso   COPD -evidence of aspiration dating back to 10/2022 CT chest.  -was started on solumedrol post arrest (? possible AECOPD vs PNA), but favor against AECOPD without hypercarbia.  -CTA 12/31 neg for PE, moderate right pleural effusion P -cont WUA/SBT efforts  -changing nebs to triple therapy + PRN duoneb  -Continue  rocephin . DC'd azithro 12/31 ( prolonged qtc) for doxy  -follow up urine Ags, trach asp  -Continue solumedrol x 5 days total (end date 1/3) -holding tagrisso    Hx non-occlusive RIJ thrombus -not taking eliquis  at home, unclear when she stopped  P  -hep gtt   Hypokalemia  Hyponatremia P -replace K  -follow lytes   Elevated LFTs  - improving P -Trend PRN  Hx HTN -Hold antihypertensives w recent soft Bps   Anemia Coagulopathy  -Trend CBC -Goal Hg >7  Left hip fracture status post ORIF 12/16/2022, Dec 2024 fall with acute displaced oblique fracture of the left iliac crest  -PT/OT when stable  Severe protein calorie malnutrition  -EN per RDN   SDOH -in admitting note there is concern for finances playing a role in medication access. TOC consult was placed at that time     Best Practice (right click and Reselect all SmartList Selections daily)   Diet/type: tubefeeds DVT prophylaxis systemic heparin  Pressure ulcer(s): N/A GI prophylaxis: H2B Lines: N/A Foley:  Yes, and it is still needed Code Status:  full code Last date of multidisciplinary goals of care discussion [12/31 -- daughter updated in afternoon. Was also reportedly updated extensively post arrest    CRITICAL CARE Performed by: Ronnald FORBES Gave   Total critical care time: 39 minutes  Critical care time was exclusive of separately billable procedures and treating other patients. Critical care was necessary to treat or prevent imminent or life-threatening deterioration.  Critical care was time spent personally by me on the following activities: development of treatment plan with patient and/or surrogate as well as nursing, discussions with consultants, evaluation of patient's response to treatment, examination of patient, obtaining history from patient or surrogate, ordering and performing treatments and interventions, ordering and review of laboratory studies, ordering and review of radiographic studies,  pulse oximetry and re-evaluation of patient's condition.   Ronnald Gave MSN, AGACNP-BC [facility] Amion for pager  01/19/2023, 11:27 AM

## 2023-01-19 NOTE — TOC Progression Note (Signed)
 Transition of Care [facility]) - Progression Note    Patient Details  Name: Anna Finley MRN: [id number] Date of Birth: December 17, 1945  Transition of Care [facility]) CM/SW Contact  Toy LITTIE Agar, RN Phone [phone]  01/19/2023, 3:34 PM  Clinical Narrative:    Patient is currently intubated and unable to participate in [facility] assessment. TOC following.         Expected Discharge Plan and Services                                               Social Determinants of Health (SDOH) Interventions SDOH Screenings   Food Insecurity: No Food Insecurity (01/16/2023)  Housing: Low Risk  (01/17/2023)  Transportation Needs: No Transportation Needs (01/17/2023)  Utilities: Not At Risk (01/17/2023)  Social Connections: Moderately Integrated (01/17/2023)  Tobacco Use: Medium Risk (01/16/2023)    Readmission Risk Interventions     No data to display

## 2023-01-19 NOTE — Plan of Care (Signed)
  Problem: Nutrition: Goal: Adequate nutrition will be maintained Outcome: Progressing   Problem: Elimination: Goal: Will not experience complications related to urinary retention Outcome: Progressing   Problem: Pain Management: Goal: General experience of comfort will improve Outcome: Not Progressing

## 2023-01-19 NOTE — Plan of Care (Signed)
  Problem: Clinical Measurements: Goal: Ability to maintain clinical measurements within normal limits will improve Outcome: Progressing Goal: Diagnostic test results will improve Outcome: Progressing Goal: Respiratory complications will improve Outcome: Progressing Goal: Cardiovascular complication will be avoided Outcome: Progressing   Problem: Nutrition: Goal: Adequate nutrition will be maintained Outcome: Progressing   Problem: Coping: Goal: Level of anxiety will decrease Outcome: Progressing   Problem: Elimination: Goal: Will not experience complications related to bowel motility Outcome: Progressing Goal: Will not experience complications related to urinary retention Outcome: Progressing   Problem: Pain Management: Goal: General experience of comfort will improve Outcome: Progressing   Problem: Safety: Goal: Ability to remain free from injury will improve Outcome: Progressing   Problem: Skin Integrity: Goal: Risk for impaired skin integrity will decrease Outcome: Progressing   Problem: Nutritional: Goal: Maintenance of adequate nutrition will improve Outcome: Progressing   Problem: Skin Integrity: Goal: Risk for impaired skin integrity will decrease Outcome: Progressing   Problem: Respiratory: Goal: Ability to maintain adequate ventilation will improve Outcome: Progressing   Problem: Respiratory: Goal: Ability to maintain adequate ventilation will improve Outcome: Progressing

## 2023-01-20 DIAGNOSIS — J9601 Acute respiratory failure with hypoxia: Secondary | ICD-10-CM

## 2023-01-20 DIAGNOSIS — J690 Pneumonitis due to inhalation of food and vomit: Secondary | ICD-10-CM

## 2023-01-20 LAB — CBC
HCT: 23.4 % — ABNORMAL LOW (ref 36.0–46.0)
Hemoglobin: 7 g/dL — ABNORMAL LOW (ref 12.0–15.0)
MCH: 27.2 pg (ref 26.0–34.0)
MCHC: 29.9 g/dL — ABNORMAL LOW (ref 30.0–36.0)
MCV: 91.1 fL (ref 80.0–100.0)
Platelets: 311 10*3/uL (ref 150–400)
RBC: 2.57 MIL/uL — ABNORMAL LOW (ref 3.87–5.11)
RDW: 17.2 % — ABNORMAL HIGH (ref 11.5–15.5)
WBC: 10.3 10*3/uL (ref 4.0–10.5)
nRBC: 0 % (ref 0.0–0.2)

## 2023-01-20 LAB — GLUCOSE, CAPILLARY
Glucose-Capillary: 123 mg/dL — ABNORMAL HIGH (ref 70–99)
Glucose-Capillary: 135 mg/dL — ABNORMAL HIGH (ref 70–99)
Glucose-Capillary: 149 mg/dL — ABNORMAL HIGH (ref 70–99)
Glucose-Capillary: 151 mg/dL — ABNORMAL HIGH (ref 70–99)
Glucose-Capillary: 169 mg/dL — ABNORMAL HIGH (ref 70–99)
Glucose-Capillary: 189 mg/dL — ABNORMAL HIGH (ref 70–99)

## 2023-01-20 LAB — HEPARIN LEVEL (UNFRACTIONATED): Heparin Unfractionated: 0.43 [IU]/mL (ref 0.30–0.70)

## 2023-01-20 LAB — BASIC METABOLIC PANEL
Anion gap: 9 (ref 5–15)
BUN: 26 mg/dL — ABNORMAL HIGH (ref 8–23)
CO2: 24 mmol/L (ref 22–32)
Calcium: 8.2 mg/dL — ABNORMAL LOW (ref 8.9–10.3)
Chloride: 101 mmol/L (ref 98–111)
Creatinine, Ser: 0.61 mg/dL (ref 0.44–1.00)
GFR, Estimated: >60 mL/min (ref >60)
Glucose, Bld: 141 mg/dL — ABNORMAL HIGH (ref 70–99)
Potassium: 5.3 mmol/L — ABNORMAL HIGH (ref 3.5–5.1)
Sodium: 134 mmol/L — ABNORMAL LOW (ref 135–145)

## 2023-01-20 LAB — TYPE AND SCREEN
ABO/RH(D): B POS
Antibody Screen: NEGATIVE

## 2023-01-20 LAB — ABO/RH: ABO/RH(D): B POS

## 2023-01-20 MED ORDER — POTASSIUM CHLORIDE 20 MEQ PO PACK
40.0000 meq | PACK | Freq: Once | ORAL | Status: AC
  Administered 2023-01-20: 40 meq

## 2023-01-20 MED ORDER — FUROSEMIDE 10 MG/ML IJ SOLN
40.0000 mg | Freq: Once | INTRAMUSCULAR | Status: AC
  Administered 2023-01-20: 40 mg via INTRAVENOUS
  Filled 2023-01-20: qty 4

## 2023-01-20 MED ORDER — POTASSIUM CHLORIDE 20 MEQ PO PACK
40.0000 meq | PACK | Freq: Once | ORAL | Status: DC
  Filled 2023-01-20: qty 2

## 2023-01-20 MED ORDER — ENOXAPARIN SODIUM 30 MG/0.3ML IJ SOSY
30.0000 mg | PREFILLED_SYRINGE | Freq: Every day | INTRAMUSCULAR | Status: DC
  Administered 2023-01-20 – 2023-01-27 (×8): 30 mg via SUBCUTANEOUS
  Filled 2023-01-20 (×8): qty 0.3

## 2023-01-20 NOTE — Plan of Care (Signed)
 Plan of care and goals reviewed with family, time given for questions, patient remains on ventilator at this time, all lines and tubes patent, bed in low locked position with call bell in reach, bed alarm on and side rails up, patient handbook/guide at bedside.  Problem: Education: Goal: Knowledge of General Education information will improve Description: Including pain rating scale, medication(s)/side effects and non-pharmacologic comfort measures Outcome: Progressing   Problem: Health Behavior/Discharge Planning: Goal: Ability to manage health-related needs will improve Outcome: Progressing   Problem: Clinical Measurements: Goal: Ability to maintain clinical measurements within normal limits will improve Outcome: Progressing Goal: Will remain free from infection Outcome: Progressing Goal: Diagnostic test results will improve Outcome: Progressing Goal: Respiratory complications will improve Outcome: Progressing Goal: Cardiovascular complication will be avoided Outcome: Progressing   Problem: Activity: Goal: Risk for activity intolerance will decrease Outcome: Progressing   Problem: Nutrition: Goal: Adequate nutrition will be maintained Outcome: Progressing   Problem: Coping: Goal: Level of anxiety will decrease Outcome: Progressing   Problem: Elimination: Goal: Will not experience complications related to bowel motility Outcome: Progressing Goal: Will not experience complications related to urinary retention Outcome: Progressing   Problem: Pain Management: Goal: General experience of comfort will improve Outcome: Progressing   Problem: Safety: Goal: Ability to remain free from injury will improve Outcome: Progressing   Problem: Skin Integrity: Goal: Risk for impaired skin integrity will decrease Outcome: Progressing   Problem: Education: Goal: Ability to describe self-care measures that may prevent or decrease complications (Diabetes Survival Skills Education)  will improve Outcome: Progressing Goal: Individualized Educational Video(s) Outcome: Progressing   Problem: Coping: Goal: Ability to adjust to condition or change in health will improve Outcome: Progressing   Problem: Fluid Volume: Goal: Ability to maintain a balanced intake and output will improve Outcome: Progressing   Problem: Health Behavior/Discharge Planning: Goal: Ability to identify and utilize available resources and services will improve Outcome: Progressing Goal: Ability to manage health-related needs will improve Outcome: Progressing   Problem: Metabolic: Goal: Ability to maintain appropriate glucose levels will improve Outcome: Progressing   Problem: Nutritional: Goal: Maintenance of adequate nutrition will improve Outcome: Progressing Goal: Progress toward achieving an optimal weight will improve Outcome: Progressing   Problem: Skin Integrity: Goal: Risk for impaired skin integrity will decrease Outcome: Progressing   Problem: Tissue Perfusion: Goal: Adequacy of tissue perfusion will improve Outcome: Progressing   Problem: Fluid Volume: Goal: Hemodynamic stability will improve Outcome: Progressing   Problem: Clinical Measurements: Goal: Diagnostic test results will improve Outcome: Progressing Goal: Signs and symptoms of infection will decrease Outcome: Progressing   Problem: Respiratory: Goal: Ability to maintain adequate ventilation will improve Outcome: Progressing   Problem: Activity: Goal: Ability to tolerate increased activity will improve Outcome: Progressing   Problem: Clinical Measurements: Goal: Ability to maintain a body temperature in the normal range will improve Outcome: Progressing   Problem: Respiratory: Goal: Ability to maintain adequate ventilation will improve Outcome: Progressing Goal: Ability to maintain a clear airway will improve Outcome: Progressing

## 2023-01-20 NOTE — Plan of Care (Signed)
  Problem: Nutrition: Goal: Adequate nutrition will be maintained Outcome: Progressing   Problem: Elimination: Goal: Will not experience complications related to urinary retention Outcome: Progressing   Problem: Education: Goal: Knowledge of General Education information will improve Description: Including pain rating scale, medication(s)/side effects and non-pharmacologic comfort measures Outcome: Not Progressing   Problem: Clinical Measurements: Goal: Respiratory complications will improve Outcome: Not Progressing Goal: Cardiovascular complication will be avoided Outcome: Not Progressing   Problem: Elimination: Goal: Will not experience complications related to bowel motility Outcome: Not Progressing   Problem: Pain Management: Goal: General experience of comfort will improve Outcome: Not Progressing   Problem: Skin Integrity: Goal: Risk for impaired skin integrity will decrease Outcome: Not Progressing

## 2023-01-20 NOTE — Progress Notes (Signed)
 Several family members updated at bedside, all questions answered

## 2023-01-20 NOTE — Progress Notes (Signed)
 Nutrition Follow-up  DOCUMENTATION CODES:   Severe malnutrition in context of chronic illness  INTERVENTION:  - Continue current TF regimen via OGT: Vital 1.2 at 45 ml/h (1080 ml per day) Provides 1296 kcal, 81 gm protein, 876 ml free water daily   - Monitor magnesium , potassium, and phosphorus BID for at least 3 days, MD to replete as needed, as pt is at risk for refeeding syndrome.  - Continue Thiamine  100 mg daily for a total of 5 days   - FWF per CCM/MD.    - Monitor weight trends.   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 Found to have sepsis d/t PNA.  12/30 Admit 12/31 cardiac arrest -> intubated; TF initiated   Patient is currently intubated on ventilator support MV: 8.8 L/min Temp (24hrs), Avg:98.5 F (36.9 C), Min:97.9 F (36.6 C), Max:99.1 F (37.3 C)  Remains on goal tube feeds, tolerating well.   Working to decrease sedation to be able to attempt SBT.    Admit weight: 95# Current weight: 100#  I&O's: +4L  Medications reviewed and include: Miralax , 100mg  thiamine  (ends tomorrow) Propofol  @ 6.73mL/hr (provides 171 kcals over 24 hours)  Labs reviewed:  Na 134 K+ 5.3   Diet Order:   Diet Order             Diet NPO time specified  Diet effective now                   EDUCATION NEEDS:  Not appropriate for education at this  time  Skin:  Skin Assessment: Skin Integrity Issues: Skin Integrity Issues:: Unstageable Unstageable: Left heel  Last BM:  1/2  Height:  Ht Readings from Last 1 Encounters:  01/18/23 5' 5 (1.651 m)   Weight:  Wt Readings from Last 1 Encounters:  01/20/23 45.8 kg   BMI:  Body mass index is 16.8 kg/m.  Estimated Nutritional Needs:  Kcal:  1200-1400 kcals Protein:  70-80 grams Fluid:  >/= 1.4L    Trude Ned RD, LDN Contact via Secure Chat.

## 2023-01-20 NOTE — Progress Notes (Signed)
 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 She was dx with RLL PNA and started on Zithromax  and Rocephin . She has hypoK and being replaced.  Today, she had resp arrest and code blue was called and she achieved ROSC after one min, intubated and moved to ICU. She quit smoking 2012, smoked 62 PY.   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 CTA chest to r/o PE was recommended by accepting [facility] physician but not obtained. Admitted to [facility] to [facility]  12/31 cardiac arrest --variable reports of time until ROSC -- 6 to 15 min.  At one point shockable, was DF. Prolonged qtc and hypokalemic, some NSVT.  CTA chest still pending.  1/1 failed extubation after sedation   Interim History / Subjective:  PSV with Vt mid 300s   Objective   Blood pressure 132/68, pulse (!) 122, temperature 98.6 F (37 C), resp. rate (!) 24, height 5' 5 (1.651 m), weight 45.8 kg, SpO2 100%.    Vent Mode: PRVC FiO2 (%):  [30 %] 30 % Set Rate:  [16 bmp] 16 bmp Vt Set:  [340 mL] 340 mL PEEP:  [5 cmH20] 5 cmH20 Plateau Pressure:  [10 cmH20-19 cmH20] 19 cmH20   Intake/Output Summary (Last 24 hours) at 01/20/2023 1043 Last data filed at 01/20/2023 1029 Gross per 24 hour  Intake 2097.36 ml  Output 2175 ml   Net -77.64 ml   Filed Weights   01/18/23 0708 01/19/23 0500 01/20/23 0529  Weight: 41.2 kg 44.4 kg 45.8 kg   Physical Exam: General: Frail appearing thin elderly F intubated sedated NAD  HENT: NCAT pink mm thin clear oral secretions. ETT secure  Respiratory: Clear. Mechanically ventilated  Cardiovascular: rr cap refill < 3 sec  GI: soft ndnt  Extremities: no acute joint deformity  Neuro: heavily sedated.   GU: foley clear yellow urine    Resolved Hospital Problem list   Shock Lactic acidosis  Hypermagnesemia   Assessment & Plan:   In hospital cardiac arrest HFrEF, unclear chronicity  Prolonged qtc NSVT   -this is documented as a respiratory arrest (was coughing and found hypoxic) but with prolonged qtc and lyte abnormalities as well as question of VF/torsades peri-arrest, post arrest NSVT, query if this was not resp arrest. Following commands post arrest.  P -optimize lytes -- BMP pending 1/3 (expect will need K, especially w lasix  below)  -hep gtt  -avoid qt prolonging agents; dc azithro, change to doxy, holding tagrisso    -wean prop; is oversedated but can also contribute to prolonged qt -giving some lasix  1/3  -likely cards consult this admission. Holding for now as runs of VT have leveled off.  Acute resp failure w hypoxia RLL PNA, aspiration, ? Post obstructive  R pleural effusion Stage Ivb NSCLC, on tagrisso   COPD,  with possible AECOPD  -evidence of aspiration dating back to 10/2022 CT chest.  -CTA 12/31 neg for PE, moderate right pleural effusion -1/2 failed SBP with incr wheezing  P -cont WUA/SBT efforts -- d/w RN 1/3 who is working diligently to wean her sedation so we can attempt SBT  -triple therapy  -doxy rocephin   -Solumedrol  -holding tagrisso   -added HTS nebs 1/2 for thick pulm secretions.  -giving lasix  1/3  -PRN CXR   Hypokalemia, hyponatremia  P -BMP pending 1/3, replace as needed  -follow lytes   Hx non-occlusive RIJ thrombus -not  taking eliquis  at home, unclear when she stopped  P  -hep gtt   Elevated LFTs  - improving P -Trend PRN  Hx HTN -Hold home atenolol  -- diuresing 1/3   Anemia Coagulopathy  -Trend CBC -Goal Hg >7 -sending type and screen 1/3   Left hip fracture status post ORIF 12/16/2022, Dec 2024 fall with acute displaced oblique fracture of the left iliac crest  -PT/OT when appropriate   Severe protein calorie malnutrition  -EN per RDN   SDOH -in admitting note there is concern for finances playing a role in medication access. TOC consult was placed at that time     Best Practice (right click and Reselect all SmartList Selections daily)   Diet/type: tubefeeds DVT prophylaxis systemic heparin  Pressure ulcer(s): N/A GI prophylaxis: H2B Lines: N/A Foley:  Yes, and it is still needed Code Status:  full code Last date of multidisciplinary goals of care discussion [12/31 -- daughter updated in afternoon. Was also reportedly updated extensively post arrest    CRITICAL CARE Performed by: Ronnald FORBES Gave   Total critical care time: 37 minutes  Critical care time was exclusive of separately billable procedures and treating other patients. Critical care was necessary to treat or prevent imminent or life-threatening deterioration.  Critical care was time spent personally by me on the following activities: development of treatment plan with patient and/or surrogate as well as nursing, discussions with consultants, evaluation of patient's response to treatment, examination of patient, obtaining history from patient or surrogate, ordering and performing treatments and interventions, ordering and review of laboratory studies, ordering and review of radiographic studies, pulse oximetry and re-evaluation of patient's condition.   Ronnald Gave MSN, AGACNP-BC [facility] Amion for pager 01/20/2023, 10:43 AM

## 2023-01-20 NOTE — Progress Notes (Signed)
 PHARMACY - ANTICOAGULATION CONSULT NOTE  Pharmacy Consult for Heparin  Indication: hx right internal jugular thrombus (previously on apixaban , but not currently)  No Known Allergies  Patient Measurements: Height: 5' 5 (165.1 cm) Weight: 45.8 kg (100 lb 15.5 oz) IBW/kg (Calculated) : 57 Heparin  Dosing Weight: actual body weight  Vital Signs: Temp: 98.1 F (36.7 C) (01/03 0330) Temp Source: Bladder (01/03 0400) BP: 140/75 (01/03 0330)  Labs: Recent Labs    01/17/23 0918 01/17/23 1138 01/17/23 1759 01/18/23 0737 01/18/23 1026 01/18/23 1118 01/18/23 2139 01/19/23 0551 01/20/23 0330  HGB  --   --    < > 7.3*  --   --   --  7.1* 7.0*  HCT  --   --   --  25.0*  --   --   --  23.4* 23.4*  PLT  --   --   --  334  --   --   --  322 311  HEPARINUNFRC  --  0.11*   < >  --    < >  --  0.44 0.45 0.43  CREATININE 0.71  --    < > 0.93  --  0.84  --  0.88  --   TROPONINIHS 104* 101*  --   --   --   --   --   --   --    < > = values in this interval not displayed.    Estimated Creatinine Clearance: 39.3 mL/min (by C-G formula based on SCr of 0.88 mg/dL).   Medical History: Past Medical History:  Diagnosis Date   COPD (chronic obstructive pulmonary disease) with chronic bronchitis (HCC)    HTN (hypertension)    Pneumothorax     Medications:  No oral anticoagulation PTA (Previously on apixaban , which patient reports was discontinued by provider; no recent refill history for apixaban  noted)  Assessment: 78 yr female admitted 12/30 with sepsis due to PNA. PMH significant for stave IV NSCLC, COPD, right internal jugular thrombus, anemia of chronic illness, s/p ORIF left hip fracture Nov 2024.  Patient s/p respiratory arrest 12/31 and now intubated.  1/1 CTA chest no definite evidence of PE  01/20/23 -Heparin  level remains therapeutic on heparin  infusion at 1000 units/hr -Hgb low but stable, plt WNL -No complications of therapy noted  Goal of Therapy:  Heparin  level  0.3-0.7 units/ml Monitor platelets by anticoagulation protocol: Yes   Plan:  -Continue heparin  infusion at 1000 units/hr -Check daily heparin  level & CBC -Will continue to follow for long term anticoagulation plans   Stefano MARLA Bologna, PharmD, BCPS Clinical Pharmacist 01/20/2023 7:19 AM

## 2023-01-21 ENCOUNTER — Inpatient Hospital Stay (HOSPITAL_COMMUNITY): Payer: 59

## 2023-01-21 LAB — GLUCOSE, CAPILLARY
Glucose-Capillary: 111 mg/dL — ABNORMAL HIGH (ref 70–99)
Glucose-Capillary: 112 mg/dL — ABNORMAL HIGH (ref 70–99)
Glucose-Capillary: 119 mg/dL — ABNORMAL HIGH (ref 70–99)
Glucose-Capillary: 162 mg/dL — ABNORMAL HIGH (ref 70–99)
Glucose-Capillary: 177 mg/dL — ABNORMAL HIGH (ref 70–99)
Glucose-Capillary: 182 mg/dL — ABNORMAL HIGH (ref 70–99)
Glucose-Capillary: 185 mg/dL — ABNORMAL HIGH (ref 70–99)
Glucose-Capillary: 186 mg/dL — ABNORMAL HIGH (ref 70–99)

## 2023-01-21 LAB — CBC
HCT: 22.7 % — ABNORMAL LOW (ref 36.0–46.0)
Hemoglobin: 6.9 g/dL — CL (ref 12.0–15.0)
MCH: 26.8 pg (ref 26.0–34.0)
MCHC: 30.4 g/dL (ref 30.0–36.0)
MCV: 88.3 fL (ref 80.0–100.0)
Platelets: 366 10*3/uL (ref 150–400)
RBC: 2.57 MIL/uL — ABNORMAL LOW (ref 3.87–5.11)
RDW: 17.2 % — ABNORMAL HIGH (ref 11.5–15.5)
WBC: 8.9 10*3/uL (ref 4.0–10.5)
nRBC: 0 % (ref 0.0–0.2)

## 2023-01-21 LAB — BASIC METABOLIC PANEL
Anion gap: 8 (ref 5–15)
BUN: 31 mg/dL — ABNORMAL HIGH (ref 8–23)
CO2: 32 mmol/L (ref 22–32)
Calcium: 8.5 mg/dL — ABNORMAL LOW (ref 8.9–10.3)
Chloride: 96 mmol/L — ABNORMAL LOW (ref 98–111)
Creatinine, Ser: 0.6 mg/dL (ref 0.44–1.00)
GFR, Estimated: >60 mL/min (ref >60)
Glucose, Bld: 117 mg/dL — ABNORMAL HIGH (ref 70–99)
Potassium: 4.8 mmol/L (ref 3.5–5.1)
Sodium: 136 mmol/L (ref 135–145)

## 2023-01-21 LAB — CULTURE, BLOOD (ROUTINE X 2): Culture: NO GROWTH

## 2023-01-21 LAB — HEMOGLOBIN AND HEMATOCRIT, BLOOD
HCT: 25.6 % — ABNORMAL LOW (ref 36.0–46.0)
Hemoglobin: 7.8 g/dL — ABNORMAL LOW (ref 12.0–15.0)

## 2023-01-21 LAB — TRIGLYCERIDES: Triglycerides: 52 mg/dL (ref <150)

## 2023-01-21 MED ORDER — FUROSEMIDE 10 MG/ML IJ SOLN
40.0000 mg | Freq: Once | INTRAMUSCULAR | Status: AC
  Administered 2023-01-21: 40 mg via INTRAVENOUS
  Filled 2023-01-21: qty 4

## 2023-01-21 MED ORDER — ORAL CARE MOUTH RINSE: 15.0000 mL | OROMUCOSAL | Status: DC | PRN

## 2023-01-21 NOTE — Progress Notes (Signed)
 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 She was dx with RLL PNA and started on Zithromax  and Rocephin . She has hypoK and being replaced.  Today, she had resp arrest and code blue was called and she achieved ROSC after one min, intubated and moved to ICU. She quit smoking 2012, smoked 62 PY.   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 CTA chest to r/o PE was recommended by accepting [facility] physician but not obtained. Admitted to [facility] to [facility]  12/31 cardiac arrest --variable reports of time until ROSC -- 6 to 15 min.  At one point shockable, was DF. Prolonged qtc and hypokalemic, some NSVT.  CTA chest still pending.  1/1 failed SBT d/t oversedation   Interim History / Subjective:  Propofol  weaned to 15 this am Patient drowsy and nodding Diuresed with good UOP Objective   Blood pressure (!) 117/55, pulse 98, temperature 99 F (37.2 C), resp. rate 18, height 5' 5 (1.651 m), weight 43.8 kg, SpO2 100%.    Vent Mode: PRVC FiO2 (%):  [30 %] 30 % Set Rate:  [16 bmp] 16 bmp Vt Set:  [340 mL] 340 mL PEEP:  [5 cmH20] 5 cmH20 Plateau Pressure:  [11 cmH20-19 cmH20] 11 cmH20   Intake/Output Summary (Last 24 hours) at 01/21/2023 0728 Last data filed at 01/21/2023 9376 Gross  per 24 hour  Intake 3314.36 ml  Output 4850 ml  Net -1535.64 ml   Filed Weights   01/19/23 0500 01/20/23 0529 01/21/23 0522  Weight: 44.4 kg 45.8 kg 43.8 kg   Physical Exam: General: Frail elderly-appearing, no acute distress HENT: [place], AT, ETT in place Eyes: EOMI, no scleral icterus Respiratory: Clear to auscultation bilaterally.  No crackles, wheezing or rales Cardiovascular: Irregular rate and rhythm, rate controlled, -M/R/G, no JVD GI: BS+, soft, nontender Extremities:-Edema,-tenderness Neuro: Eyes open, nodding, CNII-XII grossly intact Skin: Intact, no rashes or bruising GU: Foley in place  BMET stable Hg 6.9  Resolved Hospital Problem list   Shock Lactic acidosis  Hypermagnesemia  Elevated LFTs  Assessment & Plan:   In hospital cardiac arrest HFrEF, unclear chronicity  Prolonged qtc NSVT   -this is documented as a respiratory arrest (was coughing and found hypoxic) but with prolonged qtc and lyte abnormalities as well as question of VF/torsades peri-arrest, post arrest NSVT, query if this was not resp arrest. Following commands post arrest.  P -optimize lytes  -hep gtt  -avoid qt prolonging agents; dc azithro, change to doxy, holding tagrisso    -wean propofol ; is oversedated but can also contribute to prolonged qt -repeat diuresis -likely cards consult this admission. Holding for now as runs of VT have leveled off.  Acute resp failure w hypoxia RLL PNA, aspiration, ? 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  -CTA 12/31 neg for PE, moderate right pleural effusion -1/2 failed SBP with incr wheezing  P -WUA/SBT. Consider extubation  -triple therapy nebulizers -doxy rocephin   -Solumedrol (end today) -holding tagrisso   -added HTS nebs 1/2 for thick pulm secretions.  -repeat diuresis -PRN CXR   Anemia, no evidence of bleed. Suspect frequent blood  draws in critical care setting Coagulopathy  -Repeat H/H -Transfuse for goal Hg >7  -On DVT ppx  Hypokalemia, hyponatremia - resolved P -trend BMET  Hx non-occlusive RIJ thrombus -not taking eliquis  at home, unclear when she stopped  P  -Heparin  d/c'd 1/3. No evidence of PE and note from Oncology in August 2023 that states she completed Eliquis   -DVT lovenox  started  Hx HTN -Hold home atenolol  -- diuresing 1/3   Left hip fracture status post ORIF 12/16/2022, Dec 2024 fall with acute displaced oblique fracture of the left iliac crest  -PT/OT when appropriate   Severe protein calorie malnutrition  -EN per RDN   SDOH -in admitting note there is concern for finances playing a role in medication access. TOC consult was placed at that time     Best Practice (right click and Reselect all SmartList Selections daily)   Diet/type: tubefeeds DVT prophylaxis LMWH Pressure ulcer(s): N/A GI prophylaxis: H2B Lines: N/A Foley:  Yes, and it is still needed Code Status:  full code Last date of multidisciplinary goals of care discussion [ ]  Family updated 1/3   The patient is critically ill with multiple organ systems failure and requires high complexity decision making for assessment and support, frequent evaluation and titration of therapies, application of advanced monitoring technologies and extensive interpretation of multiple databases.  Independent Critical Care Time: 42 Minutes.   Slater Staff, M.D. [facility] Pulmonary/Critical Care Medicine 01/21/2023 7:28 AM   Please see Amion for pager number to reach on-call Pulmonary and Critical Care Team.

## 2023-01-21 NOTE — Progress Notes (Signed)
 Patient passed her bedside swallowing test without any problems.

## 2023-01-21 NOTE — Progress Notes (Signed)
 eLink Physician-Brief Progress Note Patient Name: Anna Finley DOB: 03/30/1945 MRN: [id number]   Date of Service  01/21/2023  HPI/Events of Note  Hgb = 6.9 on am lab.  Down from 7.0.  No active signs of bleeding  eICU Interventions  Continue observation.     Intervention Category Intermediate Interventions: Bleeding - evaluation and treatment with blood products  Wrigley Plasencia 01/21/2023, 5:59 AM

## 2023-01-21 NOTE — Procedures (Signed)
 Extubation Procedure Note  Patient Details:   Name: Anna Finley DOB: 1945/04/24 MRN: [id number]   Airway Documentation:    Vent end date: 01/22/23 Vent end time: 1110   Evaluation  O2 sats: stable throughout Complications: No apparent complications Patient did tolerate procedure well. Bilateral Breath Sounds: Clear, Diminished   Yes  Kearney Olam Caldron 01/21/2023, 11:11 AM

## 2023-01-22 DIAGNOSIS — A419 Sepsis, unspecified organism: Secondary | ICD-10-CM | POA: Diagnosis not present

## 2023-01-22 DIAGNOSIS — J189 Pneumonia, unspecified organism: Secondary | ICD-10-CM | POA: Diagnosis not present

## 2023-01-22 LAB — GLUCOSE, CAPILLARY
Glucose-Capillary: 92 mg/dL (ref 70–99)
Glucose-Capillary: 98 mg/dL (ref 70–99)
Glucose-Capillary: 155 mg/dL — ABNORMAL HIGH (ref 70–99)
Glucose-Capillary: 89 mg/dL (ref 70–99)
Glucose-Capillary: 140 mg/dL — ABNORMAL HIGH (ref 70–99)
Glucose-Capillary: 121 mg/dL — ABNORMAL HIGH (ref 70–99)

## 2023-01-22 LAB — BASIC METABOLIC PANEL
Anion gap: 10 (ref 5–15)
BUN: 25 mg/dL — ABNORMAL HIGH (ref 8–23)
CO2: 32 mmol/L (ref 22–32)
Calcium: 8.7 mg/dL — ABNORMAL LOW (ref 8.9–10.3)
Chloride: 91 mmol/L — ABNORMAL LOW (ref 98–111)
Creatinine, Ser: 0.54 mg/dL (ref 0.44–1.00)
GFR, Estimated: >60 mL/min (ref >60)
Glucose, Bld: 98 mg/dL (ref 70–99)
Potassium: 4.7 mmol/L (ref 3.5–5.1)
Sodium: 133 mmol/L — ABNORMAL LOW (ref 135–145)

## 2023-01-22 LAB — CBC
HCT: 25.3 % — ABNORMAL LOW (ref 36.0–46.0)
Hemoglobin: 7.5 g/dL — ABNORMAL LOW (ref 12.0–15.0)
MCH: 26.4 pg (ref 26.0–34.0)
MCHC: 29.6 g/dL — ABNORMAL LOW (ref 30.0–36.0)
MCV: 89.1 fL (ref 80.0–100.0)
Platelets: 386 10*3/uL (ref 150–400)
RBC: 2.84 MIL/uL — ABNORMAL LOW (ref 3.87–5.11)
RDW: 17 % — ABNORMAL HIGH (ref 11.5–15.5)
WBC: 11.3 10*3/uL — ABNORMAL HIGH (ref 4.0–10.5)
nRBC: 0 % (ref 0.0–0.2)

## 2023-01-22 LAB — MAGNESIUM: Magnesium: 1.7 mg/dL (ref 1.7–2.4)

## 2023-01-22 MED ORDER — LIDOCAINE 5 % EX PTCH
1.0000 | MEDICATED_PATCH | CUTANEOUS | Status: DC
  Administered 2023-01-22 – 2023-01-27 (×6): 1 via TRANSDERMAL
  Filled 2023-01-22 (×6): qty 1

## 2023-01-22 MED ORDER — OXYCODONE HCL 5 MG PO TABS
5.0000 mg | ORAL_TABLET | ORAL | Status: DC | PRN
  Administered 2023-01-22 – 2023-01-27 (×6): 10 mg via ORAL
  Filled 2023-01-22 (×6): qty 2

## 2023-01-22 MED ORDER — ASPIRIN 325 MG PO TABS
325.0000 mg | ORAL_TABLET | Freq: Every day | ORAL | Status: DC
  Administered 2023-01-22 – 2023-01-24 (×3): 325 mg via ORAL
  Filled 2023-01-22 (×3): qty 1

## 2023-01-22 MED ORDER — HYDROMORPHONE HCL 1 MG/ML IJ SOLN: 0.5000 mg | INTRAMUSCULAR | Status: DC | PRN

## 2023-01-22 MED ORDER — ORAL CARE MOUTH RINSE
15.0000 mL | OROMUCOSAL | Status: DC | PRN
Start: 2023-01-22 — End: 2023-01-27

## 2023-01-22 MED ORDER — POLYETHYLENE GLYCOL 3350 17 G PO PACK
17.0000 g | PACK | Freq: Every day | ORAL | Status: DC
  Administered 2023-01-25 – 2023-01-27 (×3): 17 g via ORAL
  Filled 2023-01-22 (×4): qty 1

## 2023-01-22 NOTE — Plan of Care (Signed)
  Problem: Education: Goal: Knowledge of General Education information will improve Description: Including pain rating scale, medication(s)/side effects and non-pharmacologic comfort measures Outcome: Progressing   Problem: Health Behavior/Discharge Planning: Goal: Ability to manage health-related needs will improve Outcome: Progressing   Problem: Clinical Measurements: Goal: Ability to maintain clinical measurements within normal limits will improve Outcome: Progressing Goal: Will remain free from infection Outcome: Progressing Goal: Diagnostic test results will improve Outcome: Progressing Goal: Respiratory complications will improve Outcome: Progressing Goal: Cardiovascular complication will be avoided Outcome: Progressing   Problem: Activity: Goal: Risk for activity intolerance will decrease Outcome: Progressing   Problem: Nutrition: Goal: Adequate nutrition will be maintained Outcome: Progressing   Problem: Coping: Goal: Level of anxiety will decrease Outcome: Progressing   Problem: Elimination: Goal: Will not experience complications related to bowel motility Outcome: Progressing Goal: Will not experience complications related to urinary retention Outcome: Progressing   Problem: Pain Management: Goal: General experience of comfort will improve Outcome: Progressing   Problem: Safety: Goal: Ability to remain free from injury will improve Outcome: Progressing   Problem: Skin Integrity: Goal: Risk for impaired skin integrity will decrease Outcome: Progressing   Problem: Education: Goal: Ability to describe self-care measures that may prevent or decrease complications (Diabetes Survival Skills Education) will improve Outcome: Progressing Goal: Individualized Educational Video(s) Outcome: Progressing   Problem: Coping: Goal: Ability to adjust to condition or change in health will improve Outcome: Progressing   Problem: Fluid Volume: Goal: Ability to  maintain a balanced intake and output will improve Outcome: Progressing   Problem: Health Behavior/Discharge Planning: Goal: Ability to identify and utilize available resources and services will improve Outcome: Progressing Goal: Ability to manage health-related needs will improve Outcome: Progressing   Problem: Metabolic: Goal: Ability to maintain appropriate glucose levels will improve Outcome: Progressing   Problem: Nutritional: Goal: Maintenance of adequate nutrition will improve Outcome: Progressing Goal: Progress toward achieving an optimal weight will improve Outcome: Progressing   Problem: Skin Integrity: Goal: Risk for impaired skin integrity will decrease Outcome: Progressing   Problem: Tissue Perfusion: Goal: Adequacy of tissue perfusion will improve Outcome: Progressing   Problem: Fluid Volume: Goal: Hemodynamic stability will improve Outcome: Progressing   Problem: Clinical Measurements: Goal: Diagnostic test results will improve Outcome: Progressing Goal: Signs and symptoms of infection will decrease Outcome: Progressing   Problem: Respiratory: Goal: Ability to maintain adequate ventilation will improve Outcome: Progressing   Problem: Activity: Goal: Ability to tolerate increased activity will improve Outcome: Progressing   Problem: Clinical Measurements: Goal: Ability to maintain a body temperature in the normal range will improve Outcome: Progressing   Problem: Respiratory: Goal: Ability to maintain adequate ventilation will improve Outcome: Progressing Goal: Ability to maintain a clear airway will improve Outcome: Progressing

## 2023-01-22 NOTE — Evaluation (Signed)
 Physical Therapy Evaluation Patient Details Name: Anna Finley MRN: [id number] DOB: 23-May-1945 Today's Date: 01/22/2023  History of Present Illness  78 year old female who was admitted on 12/30 with cough and worsening SOB. She was dx with RLL PNA . On 12/31 while in the ED, pt had a cardiopulmonary arrest, was resuscitated and possibly achieved ROSC between 6 to 15 minutes post resuscitation. Noted to have possible V-fib reading at some point, with prolonged QTc. Patient was subsequently intubated and was admitted in the ICU for further management. Patient was extubated on 01/21/23.   PMH: NSCLC, COPD, right IJ thrombus, HTN, anemia, left hip fracture status post ORIF 12/16/2022, and severe PCM  Clinical Impression  Pt admitted with above diagnosis.   Pt is limited by c/o pain in mid chest (likely d/t compressions). Pt is oriented to self only at  this time, unable to state her DOB, follows one step commands. Pt reports being household ambulator at baseline. At this time presents with global deconditioning, weakness all 4 extremities, pain and decr cognition. Pt is not agreeable to OOB.  Patient may  benefit from continued  follow up therapy, <3 hours/day at d/c unless family able to provide incr level of care.    Pt currently with functional limitations due to the deficits listed below (see PT Problem List). Pt will benefit from acute skilled PT to increase their independence and safety with mobility to allow discharge.           If plan is discharge home, recommend the following: A lot of help with walking and/or transfers;A lot of help with bathing/dressing/bathroom;Help with stairs or ramp for entrance;Assist for transportation;Assistance with cooking/housework;Supervision due to cognitive status   Can travel by private vehicle   No    Equipment Recommendations None recommended by PT  Recommendations for Other Services       Functional Status Assessment Patient has had a recent decline  in their functional status and demonstrates the ability to make significant improvements in function in a reasonable and predictable amount of time.     Precautions / Restrictions Precautions Precautions: Fall Precaution Comments: monitor VS Restrictions Weight Bearing Restrictions Per Provider Order: No      Mobility  Bed Mobility Overal bed mobility: Needs Assistance Bed Mobility: Rolling Rolling: Mod assist         General bed mobility comments: assist to roll, limited by pain. pt is able to long sit in bed with use of rails; pt declines to sit EOB or attempt  at this time    Transfers                        Ambulation/Gait                  Stairs            Wheelchair Mobility     Tilt Bed    Modified Rankin (Stroke Patients Only)       Balance Overall balance assessment: History of Falls (unable to assess)                                           Pertinent Vitals/Pain Pain Assessment Pain Assessment: Faces Faces Pain Scale: Hurts even more Pain Location: mid chest Pain Descriptors / Indicators: Sore, Grimacing, Guarding Pain Intervention(s): Limited activity within patient's tolerance, Monitored during session  Home Living Family/patient expects to be discharged to:: Private residence Living Arrangements: Children Available Help at Discharge: Family;Available 24 hours/day Type of Home: House           Home Equipment: Agricultural Consultant (2 wheels);BSC/3in1;Wheelchair - manual      Prior Function Prior Level of Function : Needs assist;Patient poor historian/Family not available       Physical Assist : ADLs (physical)     Mobility Comments: per pt she amb with rollator and uses w/c when going out ADLs Comments: per pt her dtr assists with ADLs at times, assists with IADLs     Extremity/Trunk Assessment   Upper Extremity Assessment Upper Extremity Assessment: Generalized weakness;Defer to OT  evaluation    Lower Extremity Assessment Lower Extremity Assessment: Generalized weakness       Communication   Communication Communication: No apparent difficulties  Cognition Arousal: Alert Behavior During Therapy: WFL for tasks assessed/performed Overall Cognitive Status: No family/caregiver present to determine baseline cognitive functioning Area of Impairment: Orientation, Memory, Following commands                 Orientation Level: Disoriented to, Place, Time, Situation   Memory: Decreased short-term memory Following Commands: Follows one step commands with increased time                General Comments      Exercises     Assessment/Plan    PT Assessment Patient needs continued PT services  PT Problem List Decreased strength;Decreased activity tolerance;Decreased mobility;Decreased balance;Decreased knowledge of precautions;Decreased knowledge of use of DME;Decreased safety awareness;Decreased cognition;Pain       PT Treatment Interventions DME instruction;Gait training;Functional mobility training;Therapeutic activities;Therapeutic exercise;Patient/family education    PT Goals (Current goals can be found in the Care Plan section)  Acute Rehab PT Goals PT Goal Formulation: Patient unable to participate in goal setting Time For Goal Achievement: 02/05/23 Potential to Achieve Goals: Fair    Frequency Min 1X/week     Co-evaluation               AM-PAC PT 6 Clicks Mobility  Outcome Measure Help needed turning from your back to your side while in a flat bed without using bedrails?: A Lot Help needed moving from lying on your back to sitting on the side of a flat bed without using bedrails?: A Lot Help needed moving to and from a bed to a chair (including a wheelchair)?: A Lot Help needed standing up from a chair using your arms (e.g., wheelchair or bedside chair)?: A Lot Help needed to walk in hospital room?: Total Help needed climbing 3-5  steps with a railing? : Total 6 Click Score: 10    End of Session   Activity Tolerance: Patient limited by fatigue;Patient limited by pain Patient left: in bed;with call bell/phone within reach;with bed alarm set;with nursing/sitter in room   PT Visit Diagnosis: Other abnormalities of gait and mobility (R26.89);Difficulty in walking, not elsewhere classified (R26.2);Muscle weakness (generalized) (M62.81)    Time: 8841-8789 PT Time Calculation (min) (ACUTE ONLY): 12 min   Charges:   PT Evaluation $PT Eval Low Complexity: 1 Low   PT General Charges $$ ACUTE PT VISIT: 1 Visit         Shereka Lafortune, PT  Acute Rehab Dept [facility]) [phone]  01/22/2023   [facility] 01/22/2023, 12:20 PM

## 2023-01-22 NOTE — Progress Notes (Addendum)
 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 She was dx with RLL PNA and started on Zithromax  and Rocephin . On 12/31 while in the ED, pt had a cardiopulmonary arrest, was resuscitated and possibly achieved ROSC between 6 to 15 minutes post resuscitation.  Noted to have possible V-fib reading at some point, with prolonged QTc.  Patient was subsequently intubated and was admitted in the ICU for further management.  Patient was extubated on 01/21/23.  [facility] assumed care 01/22/2023.    Today, patient noted to be saturating well on 2 L of O2.  Patient noted to be somewhat confused, awake, alert, oriented to self and date of birth.  Still reported some generalized chest tenderness likely from CPR, with cough.  Denies any other new complaints.  Wanting to speak to her daughter, able to follow commands.    Assessment/Plan: Principal Problem:   Sepsis due to pneumonia [facility]) Active Problems:   Malignant neoplasm of lower lobe of right lung (HCC)   Hypokalemia   Cardiac arrest (HCC)   Aspiration pneumonia of right lower lobe (HCC)   Prolonged QT interval   Shock (HCC)   Acute respiratory failure with hypoxia (HCC)   Aspiration pneumonia (HCC)   In-hospital cardiac arrest Possible cardiopulmonary arrest Noted to be hypoxic with some noted arrhythmias V-fib/prolonged Qtc Intubated on 12/31, extubated on 01/21/2023 Currently saturating on 2 L of O2  RLL pneumonia versus aspiration versus postobstructive Right pleural effusion Currently on 2 L of O2 Currently afebrile, with mild leukocytosis (was on steroids completed 01/21/23) CTA chest on 12/31  negative for PE, showed moderate right pleural effusion, noted multiple bilateral minimally displaced rib fractures likely from CPR, noted possible worsening pneumonia versus worsening malignancy Completed doxycycline , Rocephin  Continue nebulizers Completed steroids Supplemental O2  Acute hypoxic respiratory failure COPD with possible exacerbation Management as above Supplemental O2  HFrEF Prolonged Qtc NSVT Unclear chronicity, no prior echo on file Likely postcardiac arrest Echo done on 12/31 showed EF of 30 to 35%, grade 2 diastolic dysfunction Diuresed intermittently in the ICU Consult to cardiology on 01/23/2023, now extubated/stable Telemetry, monitor closely  New multiple bilateral minimally displaced rib fractures  Likely from CPR Pain management, incentive spirometry PT/OT  Normocytic anemia/Anemia of chronic disease/critical illness Baseline hemoglobin around 11 Type and screen done Daily CBC  History of hypertension BP soft Holding home atenolol   History of nonocclusive RIJ thrombus Was on heparin  in the ICU Note from oncology in August 2023 states she had completed Eliquis  Continue DVT Lovenox  Ppx  Left hip fracture s/p ORIF on 12/16/2022 PT/OT  Non-small cell lung cancer stage IV On Tagrisso , currently on hold Outpatient oncology follow-up  Pressure injury  Pressure Injury 01/16/23 Heel Left;Posterior Unstageable - Full thickness tissue loss in which the base of the injury is covered by slough (yellow, tan, gray, green or brown) and/or eschar (tan, brown or black) in the wound bed. Blackened skin on bony pr (Active)  01/16/23 1745  Location: Heel  Location Orientation: Left;Posterior  Staging: Unstageable - Full thickness tissue loss in which the base of the injury is covered by slough (yellow, tan, gray, green or brown) and/or eschar (tan, brown or  black) in the wound bed.  Wound Description (Comments): Blackened skin on bony prominence of posterior  heel  Present on Admission: Yes  Dressing Type Foam - Lift dressing to assess site every shift 01/22/23 0746   Malnutrition Type:  Nutrition Problem: Severe Malnutrition Etiology: chronic illness (NSCLC stage IV-b; COPD)   Malnutrition Characteristics:  Signs/Symptoms: severe fat depletion, severe muscle depletion   Nutrition Interventions:  Interventions: Refer to RD note for recommendations    Estimated body mass index is 16.47 kg/m as calculated from the following:   Height as of this encounter: 5' 5 (1.651 m).   Weight as of this encounter: 44.9 kg.     Code Status: Full  Family Communication: None at bedside  Disposition Plan: Status is: Inpatient Remains inpatient appropriate because: Level of care      Consultants: PCCM  Procedures: Mechanical ventilation  Antimicrobials: None  DVT prophylaxis: Lovenox    Objective: Vitals:   01/22/23 0945 01/22/23 1000 01/22/23 1027 01/22/23 1028  BP:  (!) 117/58    Pulse:  94 (!) 106   Resp: (!) 23 20 (!) 28   Temp: 98.2 F (36.8 C)  98.8 F (37.1 C)   TempSrc:      SpO2: 99% 98% (!) 88% 94%  Weight:      Height:        Intake/Output Summary (Last 24 hours) at 01/22/2023 1111 Last data filed at 01/22/2023 1100 Gross per 24 hour  Intake 652.52 ml  Output 1750 ml  Net -1097.48 ml   Filed Weights   01/20/23 0529 01/21/23 0522 01/22/23 0605  Weight: 45.8 kg 43.8 kg 44.9 kg    Exam: General: NAD, alert, awake, confused Cardiovascular: S1, S2 present Respiratory: Rhonchi noted bilaterally, chest tenderness Abdomen: Soft, nontender, nondistended, bowel sounds present Musculoskeletal: No bilateral pedal edema noted Skin: Normal Psychiatry: Normal mood     Data Reviewed: CBC: Recent Labs  Lab 01/16/23 1028 01/17/23 0056 01/18/23 0737 01/19/23 0551 01/20/23 0330 01/21/23 0500 01/21/23 1201 01/22/23 0313  WBC 5.2   < > 11.5* 11.0* 10.3 8.9  --  11.3*  NEUTROABS 4.0  --   --   --   --   --    --   --   HGB 8.4*   < > 7.3* 7.1* 7.0* 6.9* 7.8* 7.5*  HCT 27.8*   < > 25.0* 23.4* 23.4* 22.7* 25.6* 25.3*  MCV 87.1   < > 90.3 91.1 91.1 88.3  --  89.1  PLT 415*   < > 334 322 311 366  --  386   < > = values in this interval not displayed.   Basic Metabolic Panel: Recent Labs  Lab 01/17/23 1138 01/17/23 1759 01/18/23 0737 01/18/23 1118 01/19/23 0551 01/20/23 0330 01/21/23 0500 01/22/23 0313  NA  --  132* 134* 133* 130* 134* 136 133*  K  --  3.8 3.6 3.6 3.2* 5.3* 4.8 4.7  CL  --  98 100 100 98 101 96* 91*  CO2  --  25 24 23 24 24  32 32  GLUCOSE  --  159* 167* 155* 112* 141* 117* 98  BUN  --  17 19 20 22  26* 31* 25*  CREATININE  --  0.77 0.93 0.84 0.88 0.61 0.60 0.54  CALCIUM   --  8.1* 8.5* 8.1* 8.0* 8.2* 8.5* 8.7*  MG 2.4 2.3 2.4 2.2 2.3  --   --  1.7  PHOS 3.3 3.6 3.7 3.8  --   --   --   --  GFR: Estimated Creatinine Clearance: 42.4 mL/min (by C-G formula based on SCr of 0.54 mg/dL). Liver Function Tests: Recent Labs  Lab 01/17/23 0503 01/17/23 1759 01/18/23 0737 01/18/23 1118 01/19/23 0551  AST 171* 77* 44* 39 26  ALT 55* 42 33 31 27  ALKPHOS 159* 145* 138* 122 126  BILITOT 0.7 0.4 0.5 0.3 0.3  PROT 5.9* 5.7* 5.9* 5.4* 5.7*  ALBUMIN 2.5* 2.3* 2.4* 2.2* 2.3*   No results for input(s): LIPASE, AMYLASE in the last 168 hours. No results for input(s): AMMONIA in the last 168 hours. Coagulation Profile: Recent Labs  Lab 01/16/23 1028 01/17/23 0503  INR 1.1 1.3*   Cardiac Enzymes: No results for input(s): CKTOTAL, CKMB, CKMBINDEX, TROPONINI in the last 168 hours. BNP (last 3 results) No results for input(s): PROBNP in the last 8760 hours. HbA1C: No results for input(s): HGBA1C in the last 72 hours. CBG: Recent Labs  Lab 01/21/23 1708 01/21/23 1944 01/21/23 2334 01/22/23 0356 01/22/23 0756  GLUCAP 186* 185* 111* 92 98   Lipid Profile: Recent Labs    01/21/23 0500  TRIG 52   Thyroid  Function Tests: No results for input(s):  TSH, T4TOTAL, FREET4, T3FREE, THYROIDAB in the last 72 hours. Anemia Panel: No results for input(s): VITAMINB12, FOLATE, FERRITIN, TIBC, IRON, RETICCTPCT in the last 72 hours. Urine analysis: No results found for: COLORURINE, APPEARANCEUR, LABSPEC, PHURINE, GLUCOSEU, HGBUR, BILIRUBINUR, KETONESUR, PROTEINUR, UROBILINOGEN, NITRITE, LEUKOCYTESUR Sepsis Labs: @LABRCNTIP (procalcitonin:4,lacticidven:4)  ) Recent Results (from the past 240 hours)  Culture, blood (Routine x 2)     Status: None   Collection Time: 01/16/23 10:28 AM   Specimen: BLOOD LEFT ARM  Result Value Ref Range Status   Specimen Description   Final    BLOOD LEFT ARM Performed at [facility], [address], [place], [place] [address]    Special Requests   Final    BOTTLES DRAWN AEROBIC AND ANAEROBIC Blood Culture results may not be optimal due to an inadequate volume of blood received in culture bottles Performed at [facility], [address] [place], [place] [address]    Culture   Final    NO GROWTH 5 DAYS Performed at [facility], [address], [place], [place] [address]    Report Status 01/21/2023 FINAL  Final  Resp panel by RT-PCR (RSV, Flu A&B, Covid) Anterior Nasal Swab     Status: None   Collection Time: 01/16/23 11:43 AM   Specimen: Anterior Nasal Swab  Result Value Ref Range Status   SARS Coronavirus 2 by RT PCR NEGATIVE NEGATIVE Final    Comment: (NOTE) SARS-CoV-2 target nucleic acids are NOT DETECTED.  The SARS-CoV-2 RNA is generally detectable in upper respiratory specimens during the acute phase of infection. The lowest concentration of SARS-CoV-2 viral copies this assay can detect is 138 copies/mL. A negative result does not preclude SARS-Cov-2 infection and should not be used as the sole basis for treatment or other patient management decisions. A negative result may occur with  improper specimen collection/handling,  submission of specimen other than nasopharyngeal swab, presence of viral mutation(s) within the areas targeted by this assay, and inadequate number of viral copies(<138 copies/mL). A negative result must be combined with clinical observations, patient history, and epidemiological information. The expected result is Negative.  Fact Sheet for Patients:  bloggercourse.com  Fact Sheet for Healthcare Providers:  seriousbroker.it  This test is no t yet approved or cleared by the [place]  FDA and  has  been authorized for detection and/or diagnosis of SARS-CoV-2 by FDA under an Emergency Use Authorization (EUA). This EUA will remain  in effect (meaning this test can be used) for the duration of the COVID-19 declaration under Section 564(b)(1) of the Act, 21 U.S.C.section 360bbb-3(b)(1), unless the authorization is terminated  or revoked sooner.       Influenza A by PCR NEGATIVE NEGATIVE Final   Influenza B by PCR NEGATIVE NEGATIVE Final    Comment: (NOTE) The Xpert Xpress SARS-CoV-2/FLU/RSV plus assay is intended as an aid in the diagnosis of influenza from Nasopharyngeal swab specimens and should not be used as a sole basis for treatment. Nasal washings and aspirates are unacceptable for Xpert Xpress SARS-CoV-2/FLU/RSV testing.  Fact Sheet for Patients: bloggercourse.com  Fact Sheet for Healthcare Providers: seriousbroker.it  This test is not yet approved or cleared by the [place]  FDA and has been authorized for detection and/or diagnosis of SARS-CoV-2 by FDA under an Emergency Use Authorization (EUA). This EUA will remain in effect (meaning this test can be used) for the duration of the COVID-19 declaration under Section 564(b)(1) of the Act, 21 U.S.C. section 360bbb-3(b)(1), unless the authorization is terminated or revoked.     Resp Syncytial Virus by PCR NEGATIVE  NEGATIVE Final    Comment: (NOTE) Fact Sheet for Patients: bloggercourse.com  Fact Sheet for Healthcare Providers: seriousbroker.it  This test is not yet approved or cleared by the [place]  FDA and has been authorized for detection and/or diagnosis of SARS-CoV-2 by FDA under an Emergency Use Authorization (EUA). This EUA will remain in effect (meaning this test can be used) for the duration of the COVID-19 declaration under Section 564(b)(1) of the Act, 21 U.S.C. section 360bbb-3(b)(1), unless the authorization is terminated or revoked.  Performed at [facility], [address] [place], [place] [address]   MRSA Next Gen by PCR, Nasal     Status: None   Collection Time: 01/17/23  1:21 AM   Specimen: Nasal Mucosa; Nasal Swab  Result Value Ref Range Status   MRSA by PCR Next Gen NOT DETECTED NOT DETECTED Final    Comment: (NOTE) The GeneXpert MRSA Assay (FDA approved for NASAL specimens only), is one component of a comprehensive MRSA colonization surveillance program. It is not intended to diagnose MRSA infection nor to guide or monitor treatment for MRSA infections. Test performance is not FDA approved in patients less than 50 years old. Performed at [facility], [address] [place], [place] [address]   Culture, Respiratory w Gram Stain     Status: None   Collection Time: 01/17/23  9:02 AM   Specimen: Tracheal Aspirate; Respiratory  Result Value Ref Range Status   Specimen Description   Final    TRACHEAL ASPIRATE Performed at [facility], [address], [place], [place] [address]    Special Requests   Final    NONE Performed at [facility], [address] [place], [place] [address]    Gram Stain   Final    ABUNDANT WBC PRESENT, PREDOMINANTLY PMN NO ORGANISMS SEEN    Culture   Final    RARE Normal respiratory flora-no Staph aureus or  Pseudomonas seen Performed at [facility], [address], [place], [place] [address]    Report Status 01/19/2023 FINAL  Final      Studies: No results found.  Scheduled Meds:  arformoterol   15 mcg Nebulization BID   aspirin   325  mg Oral Daily   budesonide  (PULMICORT ) nebulizer solution  0.5 mg Nebulization BID   Chlorhexidine  Gluconate Cloth  6 each Topical Daily   enoxaparin  (LOVENOX ) injection  30 mg Subcutaneous Daily   insulin  aspart  0-6 Units Subcutaneous Q4H   polyethylene glycol  17 g Oral Daily   revefenacin   175 mcg Nebulization Daily   sodium chloride  flush  3 mL Intravenous Q12H    Continuous Infusions:  famotidine  (PEPCID ) IV Stopped (01/22/23 1043)     LOS: 6 days     Lebron JINNY Cage, MD [facility]  If 7PM-7AM, please contact night-coverage [email or web address] 01/22/2023, 11:11 AM

## 2023-01-23 ENCOUNTER — Other Ambulatory Visit: Payer: Self-pay

## 2023-01-23 DIAGNOSIS — I5020 Unspecified systolic (congestive) heart failure: Secondary | ICD-10-CM

## 2023-01-23 DIAGNOSIS — A419 Sepsis, unspecified organism: Secondary | ICD-10-CM | POA: Diagnosis not present

## 2023-01-23 DIAGNOSIS — J189 Pneumonia, unspecified organism: Secondary | ICD-10-CM | POA: Diagnosis not present

## 2023-01-23 DIAGNOSIS — I5041 Acute combined systolic (congestive) and diastolic (congestive) heart failure: Secondary | ICD-10-CM

## 2023-01-23 HISTORY — DX: Unspecified systolic (congestive) heart failure: I50.20

## 2023-01-23 LAB — BASIC METABOLIC PANEL
Anion gap: 9 (ref 5–15)
BUN: 18 mg/dL (ref 8–23)
CO2: 32 mmol/L (ref 22–32)
Calcium: 8.6 mg/dL — ABNORMAL LOW (ref 8.9–10.3)
Chloride: 93 mmol/L — ABNORMAL LOW (ref 98–111)
Creatinine, Ser: 0.56 mg/dL (ref 0.44–1.00)
GFR, Estimated: >60 mL/min (ref >60)
Glucose, Bld: 102 mg/dL — ABNORMAL HIGH (ref 70–99)
Potassium: 4.7 mmol/L (ref 3.5–5.1)
Sodium: 134 mmol/L — ABNORMAL LOW (ref 135–145)

## 2023-01-23 LAB — CBC WITH DIFFERENTIAL/PLATELET
Abs Immature Granulocytes: 0.04 10*3/uL (ref 0.00–0.07)
Basophils Absolute: 0 10*3/uL (ref 0.0–0.1)
Basophils Relative: 0 %
Eosinophils Absolute: 0.3 10*3/uL (ref 0.0–0.5)
Eosinophils Relative: 4 %
HCT: 26.7 % — ABNORMAL LOW (ref 36.0–46.0)
Hemoglobin: 7.8 g/dL — ABNORMAL LOW (ref 12.0–15.0)
Immature Granulocytes: 1 %
Lymphocytes Relative: 13 %
Lymphs Abs: 1 10*3/uL (ref 0.7–4.0)
MCH: 26.4 pg (ref 26.0–34.0)
MCHC: 29.2 g/dL — ABNORMAL LOW (ref 30.0–36.0)
MCV: 90.2 fL (ref 80.0–100.0)
Monocytes Absolute: 0.9 10*3/uL (ref 0.1–1.0)
Monocytes Relative: 11 %
Neutro Abs: 5.6 10*3/uL (ref 1.7–7.7)
Neutrophils Relative %: 71 %
Platelets: 423 10*3/uL — ABNORMAL HIGH (ref 150–400)
RBC: 2.96 MIL/uL — ABNORMAL LOW (ref 3.87–5.11)
RDW: 17.2 % — ABNORMAL HIGH (ref 11.5–15.5)
WBC: 7.8 10*3/uL (ref 4.0–10.5)
nRBC: 0 % (ref 0.0–0.2)

## 2023-01-23 LAB — MAGNESIUM: Magnesium: 2.1 mg/dL (ref 1.7–2.4)

## 2023-01-23 LAB — GLUCOSE, CAPILLARY
Glucose-Capillary: 102 mg/dL — ABNORMAL HIGH (ref 70–99)
Glucose-Capillary: 93 mg/dL (ref 70–99)
Glucose-Capillary: 96 mg/dL (ref 70–99)
Glucose-Capillary: 135 mg/dL — ABNORMAL HIGH (ref 70–99)
Glucose-Capillary: 159 mg/dL — ABNORMAL HIGH (ref 70–99)
Glucose-Capillary: 141 mg/dL — ABNORMAL HIGH (ref 70–99)

## 2023-01-23 LAB — PREPARE RBC (CROSSMATCH)

## 2023-01-23 MED ORDER — SODIUM CHLORIDE 0.9% IV SOLUTION: Freq: Once | INTRAVENOUS | Status: DC

## 2023-01-23 MED ORDER — ENSURE ENLIVE PO LIQD: 237.0000 mL | Freq: Three times a day (TID) | ORAL | Status: DC

## 2023-01-23 NOTE — Progress Notes (Addendum)
 This RN contacted the patient's daughter with patient at bedside about ordered blood transfusion. The patient expressed concern about receiving a blood transfusion, and wanted her daughter's to assist with decision making. Daughter said that she will call back later after speaking to family and the patient. At this time patient wants to wait on receiving blood transfusion. Raford armin Cage MD notified of patient's decision.   1645: Patient and family have decided to receive blood transfusion. Blood consent placed in chart- MD notified.

## 2023-01-23 NOTE — Evaluation (Signed)
 Occupational Therapy Evaluation Patient Details Name: Anna Finley MRN: [id number] DOB: 1946-01-15 Today's Date: 01/23/2023   History of Present Illness Patient is a 78 year old female who was admitted on 12/30 with cough and worsening SOB. She was dx with RLL PNA . On 12/31 while in the ED, pt had a cardiopulmonary arrest, was resuscitated and possibly achieved ROSC between 6 to 15 minutes post resuscitation. Noted to have possible V-fib reading at some point, with prolonged QTc. Patient was subsequently intubated and was admitted in the ICU for further management. Patient was extubated on 01/21/23.   PMH: NSCLC, COPD, right IJ thrombus, HTN, anemia, left hip fracture status post ORIF 12/16/2022, and severe PCM   Clinical Impression   Patient is a 78 year old female who was admitted for above. Patient was living at home with family support for ADLs. Currently, patient continues to have increased pain in chest with decreased functional activity tolerance impacting participation in Adls. Patient was noted to have decreased functional activity tolerance, decreased endurance, decreased standing balance, decreased safety awareness, and decreased knowledge of AD/AE impacting participation in ADLs. Patient would continue to benefit from skilled OT services at this time while admitted and after d/c to address noted deficits in order to improve overall safety and independence in ADLs. Patient will benefit from continued inpatient follow up therapy, <3 hours/day.        If plan is discharge home, recommend the following: A lot of help with bathing/dressing/bathroom;Assistance with cooking/housework;Direct supervision/assist for medications management;Assist for transportation;Direct supervision/assist for financial management;Help with stairs or ramp for entrance;A lot of help with walking and/or transfers    Functional Status Assessment  Patient has had a recent decline in their functional status and  demonstrates the ability to make significant improvements in function in a reasonable and predictable amount of time.  Equipment Recommendations  None recommended by OT       Precautions / Restrictions Precautions Precautions: Fall Precaution Comments: monitor VS Restrictions Weight Bearing Restrictions Per Provider Order: No      Mobility Bed Mobility               General bed mobility comments: up in recliner and remained in the same              ADL either performed or assessed with clinical judgement   ADL Overall ADL's : Needs assistance/impaired Eating/Feeding: Set up;Sitting   Grooming: Sitting;Maximal assistance Grooming Details (indicate cue type and reason): wears dentures but needed A to complete hygiene for them Upper Body Bathing: Contact guard assist;Sitting Upper Body Bathing Details (indicate cue type and reason): in recliner with increased time. Lower Body Bathing: Moderate assistance;Sitting/lateral leans Lower Body Bathing Details (indicate cue type and reason): in recliner Upper Body Dressing : Contact guard assist;Sitting   Lower Body Dressing: Minimal assistance;Sitting/lateral leans     Toilet Transfer Details (indicate cue type and reason): sitting in recliner with lunch                  Pertinent Vitals/Pain Pain Assessment Pain Assessment: No/denies pain     Extremity/Trunk Assessment Upper Extremity Assessment Upper Extremity Assessment: Generalized weakness (chest tenderness limiting attempts)   Lower Extremity Assessment Lower Extremity Assessment: Defer to PT evaluation   Cervical / Trunk Assessment Cervical / Trunk Assessment: Kyphotic (cachectic appearance)   Communication Communication Communication: No apparent difficulties   Cognition Arousal: Alert Behavior During Therapy: WFL for tasks assessed/performed Overall Cognitive Status: No family/caregiver present to determine baseline cognitive  functioning                        Memory: Decreased short-term memory Following Commands: Follows one step commands with increased time       General Comments: patient was noted to have some confusion during session. reported that her real birthday was [birth year] not [birth year] that somewhere it got messed up to [birth year].                Home Living Family/patient expects to be discharged to:: Private residence Living Arrangements: Children Available Help at Discharge: Family;Available 24 hours/day Type of Home: House             Bathroom Shower/Tub: Tub/shower unit         Home Equipment: Agricultural Consultant (2 wheels);BSC/3in1;Wheelchair - manual   Additional Comments: stool in tub      Prior Functioning/Environment Prior Level of Function : Needs assist;Patient poor historian/Family not available             Mobility Comments: per pt she amb with rollator and uses w/c when going out ADLs Comments: per pt her dtr assists with ADLs at times, patient reported having daughter support for bathing and dressing LB as needed. assists with IADLs        OT Problem List: Impaired balance (sitting and/or standing);Decreased safety awareness;Decreased knowledge of precautions;Cardiopulmonary status limiting activity;Pain;Decreased knowledge of use of DME or AE;Decreased activity tolerance      OT Treatment/Interventions: Self-care/ADL training;DME and/or AE instruction;Therapeutic activities;Therapeutic exercise;Balance training;Patient/family education    OT Goals(Current goals can be found in the care plan section) Acute Rehab OT Goals Patient Stated Goal: none stated OT Goal Formulation: Patient unable to participate in goal setting Time For Goal Achievement: 02/06/23 Potential to Achieve Goals: Fair  OT Frequency: Min 1X/week       AM-PAC OT 6 Clicks Daily Activity     Outcome Measure Help from another person eating meals?: A Little Help from another person taking care of personal  grooming?: A Little Help from another person toileting, which includes using toliet, bedpan, or urinal?: A Lot Help from another person bathing (including washing, rinsing, drying)?: A Lot Help from another person to put on and taking off regular upper body clothing?: A Little Help from another person to put on and taking off regular lower body clothing?: A Lot 6 Click Score: 15   End of Session    Activity Tolerance: Patient tolerated treatment well Patient left: in chair;with call bell/phone within reach;with chair alarm set  OT Visit Diagnosis: Unsteadiness on feet (R26.81);Other abnormalities of gait and mobility (R26.89)                Time: 8872-8854 OT Time Calculation (min): 18 min Charges:  OT General Charges $OT Visit: 1 Visit OT Evaluation $OT Eval Low Complexity: 1 Low  Haik Mahoney OTR/L, MS Acute Rehabilitation Department Office# [phone]   Geofm CHRISTELLA Dance 01/23/2023, 1:18 PM

## 2023-01-23 NOTE — Consult Note (Signed)
 Cardiology Consultation   Patient ID: Anna Finley MRN: [id number]; DOB: 1945/10/08  Admit date: 01/16/2023 Date of Consult: 01/23/2023  PCP:  [name], MD   [facility] Providers Cardiologist:  Annabella Scarce, MD      Patient Profile:   Anna Finley is a 78 y.o. female with a hx of NSCLC stage IV, COPD, 62 pack year history (has stopped smoking), HTN, right internal jugular thrombus (2023) previously on eliquis ,  who is being seen 01/23/2023 for the evaluation of CHF at the request of Dr. Donnamarie.  History of Present Illness:   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 She was diagnosed with end-stage severe degenerative arthritis wit failed conservative treatments. Due to frequent falls and hip dislocations, she underwent ORIF as above.  She was admitted 01/16/2023 with cough and worsening shortness of breath x 3 days.  She was diagnosed with RLL pneumonia and started on ABX.  In the ER on 01/17/2023 she suffered a respiratory arrest with ROSC after 1 min, per PCCM note, but maybe as long as 6-15 min per other documentation.  There was a question of possible V-fib at some point during the arrest with prolonged Qtc and hypokalemia.  She was extubated 01/21/23. Echo showed LVEF 30-35% with grade 2 DD. Cardiology was consulted. She received targeted doses of diuresis, none recently. Has not been started on GDMT.  During my interview, she confirms no prior cardiac history. HTN treated with atenolol -chlorthalidone  and amlodipine. She was taking 325 mg ASA BID.    She was diagnosed with NSCLC of RLL with bone mets April 2023 initially treated with palliative radiation to a T3 bone lesion. She was subsequently started on tagrisso  May 2023. Per notes, she frequently misses appointments due to  transportation issues and at times can't afford medications.   She denies SOB, but does have chest discomfort and has suffered rib fractures after CPR. She has no cardiac complaints. Difficult to know her baseline activity level, but with advanced arthritis and recent hip repair, she is severely deconditioned.  She has been recommended for SNF.    Past Medical History:  Diagnosis Date   COPD (chronic obstructive pulmonary disease) with chronic bronchitis (HCC)    HTN (hypertension)    Pneumothorax     Past Surgical History:  Procedure Laterality Date   BRONCHIAL NEEDLE ASPIRATION BIOPSY  04/20/2021   Procedure: BRONCHIAL NEEDLE ASPIRATION BIOPSIES;  Surgeon: Claudene Toribio BROCKS, MD;  Location: [facility];  Service: Endoscopy;;   BRONCHIAL WASHINGS  04/20/2021   Procedure: BRONCHIAL WASHINGS;  Surgeon: Claudene Toribio BROCKS, MD;  Location: [facility];  Service: Endoscopy;;   ENDOBRONCHIAL ULTRASOUND N/A 04/20/2021   Procedure: ENDOBRONCHIAL ULTRASOUND;  Surgeon: Claudene Toribio BROCKS, MD;  Location: [facility];  Service: Endoscopy;  Laterality: N/A;   HIP ARTHROPLASTY     VIDEO BRONCHOSCOPY Left 04/20/2021   Procedure: VIDEO BRONCHOSCOPY WITH FLUORO;  Surgeon: Claudene Toribio BROCKS, MD;  Location: [facility];  Service: Endoscopy;  Laterality: Left;  EBUS     Home Medications:  Prior to Admission medications   Medication Sig Start Date End Date Taking? Authorizing Provider  acetaminophen  (TYLENOL ) 500 MG tablet Take 500 mg by mouth in the morning, at noon, and at bedtime.   Yes [provider]  aspirin  EC 325 MG tablet Take 325 mg by  mouth daily.   Yes [provider]  atenolol -chlorthalidone  (TENORETIC ) 50-25 MG tablet Take 1 tablet by mouth daily. 05/08/21  Yes [provider]  montelukast  (SINGULAIR ) 10 MG tablet Take 10 mg by mouth at bedtime. 03/23/21  Yes [provider]  osimertinib  mesylate (TAGRISSO ) 80 MG tablet Take 1 tablet (80 mg total) by mouth daily. 12/19/22   Yes Heilingoetter, Cassandra L, PA-C  WIXELA INHUB 250-50 MCG/ACT AEPB Inhale 1 puff into the lungs 2 (two) times daily. 03/23/21  Yes [provider]  apixaban  (ELIQUIS ) 5 MG TABS tablet TAKE 1 TABLET(5 MG) BY MOUTH TWICE DAILY. FOLLOW UP WITH DISCARD REMAINDER. MOHAMED FOR ADDITIONAL REFILLS Patient not taking: Reported on 01/16/2023 05/21/22   Heilingoetter, Cassandra L, PA-C  atenolol  (TENORMIN ) 50 MG tablet Take 1 tablet (50 mg total) by mouth daily. Patient not taking: Reported on 01/16/2023 04/28/21 01/16/23  Perri DELENA Meliton Mickey., MD  polyethylene glycol (MIRALAX  / GLYCOLAX ) 17 g packet Take 17 g by mouth daily as needed for mild constipation. Meds to bed, thanks Patient not taking: Reported on 01/16/2023 04/27/21   Perri DELENA Meliton Mickey., MD  potassium chloride  SA (KLOR-CON  M) 20 MEQ tablet Take 1 tablet (20 mEq total) by mouth daily. Patient not taking: Reported on 01/16/2023 03/21/22   Sherrod Sherrod, MD    Inpatient Medications: Scheduled Meds:  arformoterol   15 mcg Nebulization BID   aspirin   325 mg Oral Daily   budesonide  (PULMICORT ) nebulizer solution  0.5 mg Nebulization BID   Chlorhexidine  Gluconate Cloth  6 each Topical Daily   enoxaparin  (LOVENOX ) injection  30 mg Subcutaneous Daily   feeding supplement  237 mL Oral TID BM   insulin  aspart  0-6 Units Subcutaneous Q4H   lidocaine   1 patch Transdermal Q24H   polyethylene glycol  17 g Oral Daily   revefenacin   175 mcg Nebulization Daily   sodium chloride  flush  3 mL Intravenous Q12H   Continuous Infusions:   PRN Meds: acetaminophen  **OR** acetaminophen , HYDROmorphone  (DILAUDID ) injection, ipratropium-albuterol , ondansetron  (ZOFRAN ) IV, mouth rinse, oxyCODONE   Allergies:   No Known Allergies  Social History:   Social History   Socioeconomic History   Marital status: Widowed    Spouse name: Not on file   Number of children: Not on file   Years of education: Not on file   Highest education level: Not on file   Occupational History   Occupation: retired  Tobacco Use   Smoking status: Former    Current packs/day: 0.00    Average packs/day: 0.5 packs/day for 53.0 years (26.5 ttl pk-yrs)    Types: Cigarettes    Start date: 106    Quit date: 2013    Years since quitting: 12.0   Smokeless tobacco: Former    Quit date: 2013  Substance and Sexual Activity   Alcohol use: Not Currently    Comment: h/o heavy use - quit in 2013   Drug use: Never   Sexual activity: Not on file  Other Topics Concern   Not on file  Social History Narrative   Not on file   Social Drivers of Health   Financial Resource Strain: Not on file  Food Insecurity: No Food Insecurity (01/16/2023)   Hunger Vital Sign    Worried About Running Out of Food in the Last Year: Never true    Ran Out of Food in the Last Year: Never true  Transportation Needs: No Transportation Needs (01/17/2023)   PRAPARE - Transportation    Lack  of Transportation (Medical): No    Lack of Transportation (Non-Medical): No  Physical Activity: Not on file  Stress: Not on file  Social Connections: Moderately Integrated (01/17/2023)   Social Connection and Isolation Panel [NHANES]    Frequency of Communication with Friends and Family: More than three times a week    Frequency of Social Gatherings with Friends and Family: More than three times a week    Attends Religious Services: More than 4 times per year    Active Member of [employer] or Organizations: Yes    Attends Banker Meetings: 1 to 4 times per year    Marital Status: Widowed  Intimate Partner Violence: Not At Risk (01/17/2023)   Humiliation, Afraid, Rape, and Kick questionnaire    Fear of Current or Ex-Partner: No    Emotionally Abused: No    Physically Abused: No    Sexually Abused: No    Family History:    Family History  Problem Relation Age of Onset   Cancer Father 46       stomach     ROS:  Please see the history of present illness.   All other ROS reviewed  and negative.     Physical Exam/Data:   Vitals:   01/23/23 0700 01/23/23 0800 01/23/23 0900 01/23/23 1000  BP: 123/60 133/66 (!) 116/52 134/65  Pulse: 94   (!) 103  Resp: 20 20 20  (!) 22  Temp: 98.8 F (37.1 C) 98.8 F (37.1 C) 99 F (37.2 C) 99 F (37.2 C)  TempSrc:  Bladder    SpO2: 93% 94%  100%  Weight:      Height:        Intake/Output Summary (Last 24 hours) at 01/23/2023 1154 Last data filed at 01/23/2023 1000 Gross per 24 hour  Intake 170 ml  Output 730 ml  Net -560 ml      01/22/2023    6:05 AM 01/21/2023    5:22 AM 01/20/2023    5:29 AM  Last 3 Weights  Weight (lbs) 98 lb 15.8 oz 96 lb 9 oz 100 lb 15.5 oz  Weight (kg) 44.9 kg 43.8 kg 45.8 kg     Body mass index is 16.47 kg/m.  General:  thin female in NAD, wet cough HEENT: normal Neck: + JVD Vascular: Distal pulses 2+ bilaterally Cardiac:  normal S1, S2; RRR; no murmur  Lungs:   bilateral rhonchi, respirations unlabored, cough Abd: soft, nontender, no hepatomegaly  Ext: no edema Musculoskeletal:  No deformities, BUE and BLE strength normal and equal Skin: warm and dry  Neuro:  CNs 2-12 intact, no focal abnormalities noted Psych:  Normal affect   EKG:  The EKG was personally reviewed and demonstrates:  sinus tachycardia with HR 125 with PVCs Telemetry:  Telemetry was personally reviewed and demonstrates:  sinus to sinus tachycardia in the 90-100s  Relevant CV Studies:  Echo 01/17/23:  1. Technically difficult study with limited visualization of cardiac  structures.   2. Left ventricular ejection fraction, by estimation, is 30 to 35%. The  left ventricle has moderately decreased function. The left ventricle  demonstrates global hypokinesis. Left ventricular diastolic parameters are  consistent with Grade II diastolic  dysfunction (pseudonormalization).   3. Right ventricular systolic function is mildly reduced. The right  ventricular size is normal. There is normal pulmonary artery systolic  pressure.    4. Left atrial size was moderately dilated.   5. The mitral valve is normal in structure. Trivial mitral valve  regurgitation. No evidence of mitral stenosis.   6. The aortic valve is grossly normal. Aortic valve regurgitation is not  visualized. No aortic stenosis is present.   7. The inferior vena cava is normal in size with greater than 50%  respiratory variability, suggesting right atrial pressure of 3 mmHg.   Laboratory Data:  High Sensitivity Troponin:   Recent Labs  Lab 01/17/23 0056 01/17/23 0459 01/17/23 0918 01/17/23 1138  TROPONINIHS 81* 125* 104* 101*     Chemistry Recent Labs  Lab 01/19/23 0551 01/20/23 0330 01/21/23 0500 01/22/23 0313 01/23/23 0311  NA 130*   < > 136 133* 134*  K 3.2*   < > 4.8 4.7 4.7  CL 98   < > 96* 91* 93*  CO2 24   < > 32 32 32  GLUCOSE 112*   < > 117* 98 102*  BUN 22   < > 31* 25* 18  CREATININE 0.88   < > 0.60 0.54 0.56  CALCIUM  8.0*   < > 8.5* 8.7* 8.6*  MG 2.3  --   --  1.7 2.1  GFRNONAA >60   < > >60 >60 >60  ANIONGAP 8   < > 8 10 9    < > = values in this interval not displayed.    Recent Labs  Lab 01/18/23 0737 01/18/23 1118 01/19/23 0551  PROT 5.9* 5.4* 5.7*  ALBUMIN 2.4* 2.2* 2.3*  AST 44* 39 26  ALT 33 31 27  ALKPHOS 138* 122 126  BILITOT 0.5 0.3 0.3   Lipids  Recent Labs  Lab 01/21/23 0500  TRIG 52    Hematology Recent Labs  Lab 01/21/23 0500 01/21/23 1201 01/22/23 0313 01/23/23 0311  WBC 8.9  --  11.3* 7.8  RBC 2.57*  --  2.84* 2.96*  HGB 6.9* 7.8* 7.5* 7.8*  HCT 22.7* 25.6* 25.3* 26.7*  MCV 88.3  --  89.1 90.2  MCH 26.8  --  26.4 26.4  MCHC 30.4  --  29.6* 29.2*  RDW 17.2*  --  17.0* 17.2*  PLT 366  --  386 423*   Thyroid   Recent Labs  Lab 01/17/23 0503  TSH 1.236  FREET4 1.49*    BNP Recent Labs  Lab 01/17/23 0918  BNP 1,272.6*    DDimer No results for input(s): DDIMER in the last 168 hours.   Radiology/Studies:  DG CHEST PORT 1 VIEW Result Date: 01/21/2023 CLINICAL DATA:   Pleural effusion. EXAM: PORTABLE CHEST 1 VIEW COMPARISON:  Radiographs 01/18/2022 and 01/17/2023. Chest CTA 01/17/2023 band 12/21/2022. FINDINGS: 1022 hours. The endotracheal and enteric tubes are unchanged in position. The visualized heart size and mediastinal contours are stable. Unchanged moderate-sized right pleural effusion with associated right lower lobe airspace disease and volume loss. The left lung appears clear. No evidence of pneumothorax. The bones appear unchanged with advanced glenohumeral degenerative changes bilaterally. IMPRESSION: Unchanged moderate-sized right pleural effusion with associated right lower lobe airspace disease and volume loss. No evidence of pneumothorax. Electronically Signed   By: Elsie Perone M.D.   On: 01/21/2023 11:30   DG CHEST PORT 1 VIEW Result Date: 01/19/2023 CLINICAL DATA:  Wheezing EXAM: PORTABLE CHEST 1 VIEW COMPARISON:  01/17/2023 FINDINGS: Cardiac shadow is stable. Endotracheal tube and gastric catheter are noted in satisfactory position. Increasing right-sided pleural effusion is noted with likely underlying atelectasis. Left lung is clear. IMPRESSION: Increasing right-sided pleural effusion with underlying atelectasis. Electronically Signed   By: Oneil Devonshire M.D.   On: 01/19/2023 23:28  Assessment and Plan:   Acute systolic and diastolic heart failure - Echocardiogram with LVEF 30-35%, grade 2 DD, mildly reduced RV, moderate LAE, trivial MR -- no prior studies in Epic/CareEverywhere -- IV diuresis briefly (2 days) -- SBP fluctuating 110-130s with intermittent tachycardia -- unclear etiology given recent respiratory arrest with documentation of shockable rhythm vs tagrisso  therapy vs ischemic process given risk factors -- I do not think she needs further diuresis with IV lasix , will try to start GDMT -- hold prior/home anti-hypertensives (atenolol , chlorthalidone , amlodipine) - consider starting spironolactone but will need to watch  potassium -- will hold off on BB for another day given respiratory status, but may tolerate adding back low dose of home atenolol  or bisoprolol -- appreciate SW help to determine if she can get low dose entresto, although will need to monitor BP to see if this can be added -- would repeat a limited echo after GDMT titrated - she has indication for lifevest, although unclear if she would be a candidate for ICD    Tagrisso  therapy - for lung cancer - can cause cardiac complications such as cardiomyopathy and prolonged QT - would need to reach out to oncology for goals of care and possible alternatives to tagrisso    Prolonged QTC - repeat an EKG today   Anemia of chronic disease - Hb 7.8 - no signs of bleeding   Sepsis due to RLL pneumonia versus aspiration pneumonia COPD Right pleural effusion NSCLC stage IV Acute hypoxic respiratory failure - ?respiratory arrest in the ER - extubated 01/20/22    Risk Assessment/Risk Scores:        [place]  Heart Association (NYHA) Functional Class NYHA Class IV        For questions or updates, please contact [facility] Please consult [email or web address] for contact info under    Signed, Jon Nat Hails, PA  01/23/2023 11:54 AM

## 2023-01-23 NOTE — Progress Notes (Signed)
 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 She was dx with RLL PNA and started on Zithromax  and Rocephin . On 12/31 while in the ED, pt had a cardiopulmonary arrest, was resuscitated and possibly achieved ROSC between 6 to 15 minutes post resuscitation.  Noted to have possible V-fib reading at some point, with prolonged QTc.  Patient was subsequently intubated and was admitted in the ICU for further management.  Patient was extubated on 01/21/23.  [facility] assumed care 01/22/2023.    Today, patient still reporting chest wall tenderness, noted broken ribs.  Denies any abdominal pain, nausea/vomiting, fever/chills.    Assessment/Plan: Principal Problem:   Sepsis due to pneumonia [facility]) Active Problems:   Malignant neoplasm of lower lobe of right lung (HCC)   Hypokalemia   Cardiac arrest (HCC)   Aspiration pneumonia of right lower lobe (HCC)   Prolonged QT interval   Shock (HCC)   Acute respiratory failure with hypoxia (HCC)   Aspiration pneumonia (HCC)   In-hospital cardiac arrest Possible cardiopulmonary arrest Noted to be hypoxic with some noted arrhythmias V-fib/prolonged Qtc Intubated on 12/31, extubated on 01/21/2023 Currently saturating well on RA  RLL pneumonia versus aspiration versus postobstructive Right pleural effusion Currently on RA Currently afebrile, with no leukocytosis CTA chest on 12/31 negative for PE, showed moderate right pleural effusion, noted multiple bilateral minimally displaced rib fractures likely from CPR, noted possible worsening pneumonia versus worsening malignancy Completed doxycycline ,  Rocephin  Continue nebulizers Completed steroids Supplemental O2  Acute hypoxic respiratory failure COPD with possible exacerbation Management as above Supplemental O2  HFrEF Prolonged Qtc NSVT Unclear chronicity, no prior echo on file Likely postcardiac arrest Echo done on 12/31 showed EF of 30 to 35%, grade 2 diastolic dysfunction Diuresed intermittently in the ICU Cardiology consulted on 01/23/2023 Telemetry, monitor closely  New multiple bilateral minimally displaced rib fractures  Likely from CPR Pain management, incentive spirometry PT/OT  Normocytic anemia/Anemia of chronic disease/critical illness Baseline hemoglobin around 11 Type and screen done Daily CBC  History of hypertension BP soft Holding home atenolol   History of nonocclusive RIJ thrombus Was on heparin  in the ICU Note from oncology in August 2023 states she had completed Eliquis  Continue DVT Lovenox  Ppx  Left hip fracture s/p ORIF on 12/16/2022 PT/OT  Non-small cell lung cancer stage IV On Tagrisso , currently on hold Outpatient oncology follow-up  Pressure injury  Pressure Injury 01/16/23 Heel Left;Posterior Unstageable - Full thickness tissue loss in which the base of the injury is covered by slough (yellow, tan, gray, green or brown) and/or eschar (tan, brown or black) in the wound bed. Blackened skin on bony pr (Active)  01/16/23 1745  Location: Heel  Location Orientation: Left;Posterior  Staging: Unstageable - Full thickness tissue loss in which the base of the injury is covered by slough (yellow, tan, gray, green or brown) and/or eschar (tan, brown or black) in the wound bed.  Wound Description (Comments): Blackened skin on bony prominence of posterior heel  Present on Admission: Yes  Dressing Type Foam - Lift dressing to assess site every shift 01/23/23 0805   Malnutrition Type:  Nutrition Problem: Severe Malnutrition Etiology: chronic illness (NSCLC stage IV-b; COPD)  Malnutrition  Characteristics:  Signs/Symptoms: severe fat depletion, severe muscle depletion   Nutrition Interventions:  Interventions: Refer to RD note for recommendations    Estimated body mass index is 16.47 kg/m as calculated from the following:   Height as of this encounter: 5' 5 (1.651 m).   Weight as of this encounter: 44.9 kg.     Code Status: Full  Family Communication: None at bedside  Disposition Plan: Status is: Inpatient Remains inpatient appropriate because: Level of care      Consultants: PCCM Cardiology   Procedures: Mechanical ventilation  Antimicrobials: None  DVT prophylaxis: Lovenox    Objective: Vitals:   01/23/23 0700 01/23/23 0800 01/23/23 0900 01/23/23 1000  BP: 123/60 133/66 (!) 116/52 134/65  Pulse: 94   (!) 103  Resp: 20 20 20  (!) 22  Temp: 98.8 F (37.1 C) 98.8 F (37.1 C) 99 F (37.2 C) 99 F (37.2 C)  TempSrc:  Bladder    SpO2: 93% 94%  100%  Weight:      Height:        Intake/Output Summary (Last 24 hours) at 01/23/2023 1123 Last data filed at 01/23/2023 1000 Gross per 24 hour  Intake 170 ml  Output 730 ml  Net -560 ml   Filed Weights   01/20/23 0529 01/21/23 0522 01/22/23 0605  Weight: 45.8 kg 43.8 kg 44.9 kg    Exam: General: NAD, alert, awake Cardiovascular: S1, S2 present Respiratory: Diminished breath sounds bilaterally, chest tenderness Abdomen: Soft, nontender, nondistended, bowel sounds present Musculoskeletal: No bilateral pedal edema noted Skin: Normal Psychiatry: Normal mood     Data Reviewed: CBC: Recent Labs  Lab 01/19/23 0551 01/20/23 0330 01/21/23 0500 01/21/23 1201 01/22/23 0313 01/23/23 0311  WBC 11.0* 10.3 8.9  --  11.3* 7.8  NEUTROABS  --   --   --   --   --  5.6  HGB 7.1* 7.0* 6.9* 7.8* 7.5* 7.8*  HCT 23.4* 23.4* 22.7* 25.6* 25.3* 26.7*  MCV 91.1 91.1 88.3  --  89.1 90.2  PLT 322 311 366  --  386 423*   Basic Metabolic Panel: Recent Labs  Lab 01/17/23 1138 01/17/23 1759  01/18/23 0737 01/18/23 1118 01/19/23 0551 01/20/23 0330 01/21/23 0500 01/22/23 0313 01/23/23 0311  NA  --  132* 134* 133* 130* 134* 136 133* 134*  K  --  3.8 3.6 3.6 3.2* 5.3* 4.8 4.7 4.7  CL  --  98 100 100 98 101 96* 91* 93*  CO2  --  25 24 23 24 24  32 32 32  GLUCOSE  --  159* 167* 155* 112* 141* 117* 98 102*  BUN  --  17 19 20 22  26* 31* 25* 18  CREATININE  --  0.77 0.93 0.84 0.88 0.61 0.60 0.54 0.56  CALCIUM   --  8.1* 8.5* 8.1* 8.0* 8.2* 8.5* 8.7* 8.6*  MG 2.4 2.3 2.4 2.2 2.3  --   --  1.7 2.1  PHOS 3.3 3.6 3.7 3.8  --   --   --   --   --    GFR: Estimated Creatinine Clearance: 42.4 mL/min (by C-G formula based on SCr of 0.56 mg/dL). Liver Function Tests: Recent Labs  Lab 01/17/23 0503 01/17/23 1759 01/18/23 0737 01/18/23 1118 01/19/23 0551  AST 171* 77* 44* 39 26  ALT 55* 42 33 31 27  ALKPHOS 159* 145* 138* 122 126  BILITOT 0.7 0.4 0.5 0.3 0.3  PROT 5.9* 5.7* 5.9* 5.4* 5.7*  ALBUMIN 2.5* 2.3* 2.4*  2.2* 2.3*   No results for input(s): LIPASE, AMYLASE in the last 168 hours. No results for input(s): AMMONIA in the last 168 hours. Coagulation Profile: Recent Labs  Lab 01/17/23 0503  INR 1.3*   Cardiac Enzymes: No results for input(s): CKTOTAL, CKMB, CKMBINDEX, TROPONINI in the last 168 hours. BNP (last 3 results) No results for input(s): PROBNP in the last 8760 hours. HbA1C: No results for input(s): HGBA1C in the last 72 hours. CBG: Recent Labs  Lab 01/22/23 1621 01/22/23 2033 01/22/23 2322 01/23/23 0349 01/23/23 0745  GLUCAP 89 140* 121* 102* 93   Lipid Profile: Recent Labs    01/21/23 0500  TRIG 52   Thyroid  Function Tests: No results for input(s): TSH, T4TOTAL, FREET4, T3FREE, THYROIDAB in the last 72 hours. Anemia Panel: No results for input(s): VITAMINB12, FOLATE, FERRITIN, TIBC, IRON, RETICCTPCT in the last 72 hours. Urine analysis: No results found for: COLORURINE, APPEARANCEUR, LABSPEC,  PHURINE, GLUCOSEU, HGBUR, BILIRUBINUR, KETONESUR, PROTEINUR, UROBILINOGEN, NITRITE, LEUKOCYTESUR Sepsis Labs: @LABRCNTIP (procalcitonin:4,lacticidven:4)  ) Recent Results (from the past 240 hours)  Culture, blood (Routine x 2)     Status: None   Collection Time: 01/16/23 10:28 AM   Specimen: BLOOD LEFT ARM  Result Value Ref Range Status   Specimen Description   Final    BLOOD LEFT ARM Performed at [facility], [address] [place], [place] [address]    Special Requests   Final    BOTTLES DRAWN AEROBIC AND ANAEROBIC Blood Culture results may not be optimal due to an inadequate volume of blood received in culture bottles Performed at [facility], [address] [place], [place] [address]    Culture   Final    NO GROWTH 5 DAYS Performed at [facility], [address] [place], [place] [address]    Report Status 01/21/2023 FINAL  Final  Resp panel by RT-PCR (RSV, Flu A&B, Covid) Anterior Nasal Swab     Status: None   Collection Time: 01/16/23 11:43 AM   Specimen: Anterior Nasal Swab  Result Value Ref Range Status   SARS Coronavirus 2 by RT PCR NEGATIVE NEGATIVE Final    Comment: (NOTE) SARS-CoV-2 target nucleic acids are NOT DETECTED.  The SARS-CoV-2 RNA is generally detectable in upper respiratory specimens during the acute phase of infection. The lowest concentration of SARS-CoV-2 viral copies this assay can detect is 138 copies/mL. A negative result does not preclude SARS-Cov-2 infection and should not be used as the sole basis for treatment or other patient management decisions. A negative result may occur with  improper specimen collection/handling, submission of specimen other than nasopharyngeal swab, presence of viral mutation(s) within the areas targeted by this assay, and inadequate number of viral copies(<138 copies/mL). A negative result must be combined with clinical observations, patient history, and  epidemiological information. The expected result is Negative.  Fact Sheet for Patients:  bloggercourse.com  Fact Sheet for Healthcare Providers:  seriousbroker.it  This test is no t yet approved or cleared by the [place]  FDA and  has been authorized for detection and/or diagnosis of SARS-CoV-2 by FDA under an Emergency Use Authorization (EUA). This EUA will remain  in effect (meaning this test can be used) for the duration of the COVID-19 declaration under Section 564(b)(1) of the Act, 21 U.S.C.section 360bbb-3(b)(1), unless the authorization is terminated  or revoked sooner.       Influenza A by PCR NEGATIVE NEGATIVE Final   Influenza B by PCR NEGATIVE NEGATIVE  Final    Comment: (NOTE) The Xpert Xpress SARS-CoV-2/FLU/RSV plus assay is intended as an aid in the diagnosis of influenza from Nasopharyngeal swab specimens and should not be used as a sole basis for treatment. Nasal washings and aspirates are unacceptable for Xpert Xpress SARS-CoV-2/FLU/RSV testing.  Fact Sheet for Patients: bloggercourse.com  Fact Sheet for Healthcare Providers: seriousbroker.it  This test is not yet approved or cleared by the [place]  FDA and has been authorized for detection and/or diagnosis of SARS-CoV-2 by FDA under an Emergency Use Authorization (EUA). This EUA will remain in effect (meaning this test can be used) for the duration of the COVID-19 declaration under Section 564(b)(1) of the Act, 21 U.S.C. section 360bbb-3(b)(1), unless the authorization is terminated or revoked.     Resp Syncytial Virus by PCR NEGATIVE NEGATIVE Final    Comment: (NOTE) Fact Sheet for Patients: bloggercourse.com  Fact Sheet for Healthcare Providers: seriousbroker.it  This test is not yet approved or cleared by the [place]  FDA and has been  authorized for detection and/or diagnosis of SARS-CoV-2 by FDA under an Emergency Use Authorization (EUA). This EUA will remain in effect (meaning this test can be used) for the duration of the COVID-19 declaration under Section 564(b)(1) of the Act, 21 U.S.C. section 360bbb-3(b)(1), unless the authorization is terminated or revoked.  Performed at [facility], [address] [place], [place] [address]   MRSA Next Gen by PCR, Nasal     Status: None   Collection Time: 01/17/23  1:21 AM   Specimen: Nasal Mucosa; Nasal Swab  Result Value Ref Range Status   MRSA by PCR Next Gen NOT DETECTED NOT DETECTED Final    Comment: (NOTE) The GeneXpert MRSA Assay (FDA approved for NASAL specimens only), is one component of a comprehensive MRSA colonization surveillance program. It is not intended to diagnose MRSA infection nor to guide or monitor treatment for MRSA infections. Test performance is not FDA approved in patients less than 4 years old. Performed at [facility], [address] [place], [place] [address]   Culture, Respiratory w Gram Stain     Status: None   Collection Time: 01/17/23  9:02 AM   Specimen: Tracheal Aspirate; Respiratory  Result Value Ref Range Status   Specimen Description   Final    TRACHEAL ASPIRATE Performed at [facility], [address] [place], [place] [address]    Special Requests   Final    NONE Performed at [facility], [address], [place], [place] [address]    Gram Stain   Final    ABUNDANT WBC PRESENT, PREDOMINANTLY PMN NO ORGANISMS SEEN    Culture   Final    RARE Normal respiratory flora-no Staph aureus or Pseudomonas seen Performed at [facility], [address], [place], [place] [address]    Report Status 01/19/2023 FINAL  Final      Studies: No results found.  Scheduled Meds:  arformoterol   15 mcg Nebulization BID   aspirin   325 mg Oral Daily    budesonide  (PULMICORT ) nebulizer solution  0.5 mg Nebulization BID   Chlorhexidine  Gluconate Cloth  6 each Topical Daily   enoxaparin  (LOVENOX ) injection  30 mg Subcutaneous Daily   feeding supplement  237 mL Oral TID BM   insulin  aspart  0-6 Units Subcutaneous Q4H   lidocaine   1 patch Transdermal Q24H   polyethylene glycol  17 g Oral Daily   revefenacin   175  mcg Nebulization Daily   sodium chloride  flush  3 mL Intravenous Q12H    Continuous Infusions:  famotidine  (PEPCID ) IV Stopped (01/23/23 1022)     LOS: 7 days     Lebron JINNY Cage, MD [facility]  If 7PM-7AM, please contact night-coverage [email or web address] 01/23/2023, 11:23 AM

## 2023-01-24 ENCOUNTER — Telehealth (HOSPITAL_COMMUNITY): Payer: Self-pay | Admitting: Pharmacy Technician

## 2023-01-24 ENCOUNTER — Other Ambulatory Visit (HOSPITAL_COMMUNITY): Payer: Self-pay

## 2023-01-24 LAB — BASIC METABOLIC PANEL
Anion gap: 10 (ref 5–15)
BUN: 14 mg/dL (ref 8–23)
CO2: 26 mmol/L (ref 22–32)
Calcium: 8.6 mg/dL — ABNORMAL LOW (ref 8.9–10.3)
Chloride: 95 mmol/L — ABNORMAL LOW (ref 98–111)
Creatinine, Ser: 0.58 mg/dL (ref 0.44–1.00)
GFR, Estimated: >60 mL/min (ref >60)
Glucose, Bld: 111 mg/dL — ABNORMAL HIGH (ref 70–99)
Potassium: 4.1 mmol/L (ref 3.5–5.1)
Sodium: 131 mmol/L — ABNORMAL LOW (ref 135–145)

## 2023-01-24 LAB — LIPID PANEL
Cholesterol: 232 mg/dL — ABNORMAL HIGH (ref 0–200)
HDL: 62 mg/dL (ref >40)
LDL Cholesterol: 151 mg/dL — ABNORMAL HIGH (ref 0–99)
Total CHOL/HDL Ratio: 3.7 {ratio}
Triglycerides: 97 mg/dL (ref <150)
VLDL: 19 mg/dL (ref 0–40)

## 2023-01-24 LAB — GLUCOSE, CAPILLARY
Glucose-Capillary: 108 mg/dL — ABNORMAL HIGH (ref 70–99)
Glucose-Capillary: 113 mg/dL — ABNORMAL HIGH (ref 70–99)
Glucose-Capillary: 163 mg/dL — ABNORMAL HIGH (ref 70–99)
Glucose-Capillary: 154 mg/dL — ABNORMAL HIGH (ref 70–99)
Glucose-Capillary: 169 mg/dL — ABNORMAL HIGH (ref 70–99)
Glucose-Capillary: 102 mg/dL — ABNORMAL HIGH (ref 70–99)

## 2023-01-24 LAB — CBC WITH DIFFERENTIAL/PLATELET
Abs Immature Granulocytes: 0.05 10*3/uL (ref 0.00–0.07)
Basophils Absolute: 0 10*3/uL (ref 0.0–0.1)
Basophils Relative: 0 %
Eosinophils Absolute: 0.3 10*3/uL (ref 0.0–0.5)
Eosinophils Relative: 3 %
HCT: 30.8 % — ABNORMAL LOW (ref 36.0–46.0)
Hemoglobin: 9.9 g/dL — ABNORMAL LOW (ref 12.0–15.0)
Immature Granulocytes: 1 %
Lymphocytes Relative: 11 %
Lymphs Abs: 0.9 10*3/uL (ref 0.7–4.0)
MCH: 27.9 pg (ref 26.0–34.0)
MCHC: 32.1 g/dL (ref 30.0–36.0)
MCV: 86.8 fL (ref 80.0–100.0)
Monocytes Absolute: 1 10*3/uL (ref 0.1–1.0)
Monocytes Relative: 13 %
Neutro Abs: 5.9 10*3/uL (ref 1.7–7.7)
Neutrophils Relative %: 72 %
Platelets: 412 10*3/uL — ABNORMAL HIGH (ref 150–400)
RBC: 3.55 MIL/uL — ABNORMAL LOW (ref 3.87–5.11)
RDW: 16.2 % — ABNORMAL HIGH (ref 11.5–15.5)
WBC: 8.2 10*3/uL (ref 4.0–10.5)
nRBC: 0 % (ref 0.0–0.2)

## 2023-01-24 LAB — TYPE AND SCREEN
ABO/RH(D): B POS
Antibody Screen: NEGATIVE
Unit division: 0

## 2023-01-24 LAB — BPAM RBC
Blood Product Expiration Date: 202501252359
ISSUE DATE / TIME: 202501061701
Unit Type and Rh: 7300

## 2023-01-24 LAB — MAGNESIUM: Magnesium: 2.3 mg/dL (ref 1.7–2.4)

## 2023-01-24 MED ORDER — ASPIRIN 81 MG PO TBEC
81.0000 mg | DELAYED_RELEASE_TABLET | Freq: Every day | ORAL | Status: DC
  Administered 2023-01-25 – 2023-01-27 (×3): 81 mg via ORAL
  Filled 2023-01-24 (×3): qty 1

## 2023-01-24 MED ORDER — DAPAGLIFLOZIN PROPANEDIOL 10 MG PO TABS
10.0000 mg | ORAL_TABLET | Freq: Every day | ORAL | Status: DC
  Administered 2023-01-24 – 2023-01-27 (×4): 10 mg via ORAL
  Filled 2023-01-24 (×4): qty 1

## 2023-01-24 MED ORDER — BOOST / RESOURCE BREEZE PO LIQD CUSTOM
1.0000 | Freq: Three times a day (TID) | ORAL | Status: DC
  Administered 2023-01-24 – 2023-01-27 (×10): 1 via ORAL

## 2023-01-24 MED ORDER — ROSUVASTATIN CALCIUM 20 MG PO TABS
20.0000 mg | ORAL_TABLET | Freq: Every day | ORAL | Status: DC
  Administered 2023-01-24 – 2023-01-27 (×4): 20 mg via ORAL
  Filled 2023-01-24 (×4): qty 1

## 2023-01-24 MED ORDER — ADULT MULTIVITAMIN W/MINERALS CH
1.0000 | ORAL_TABLET | Freq: Every day | ORAL | Status: DC
  Administered 2023-01-25 – 2023-01-27 (×3): 1 via ORAL
  Filled 2023-01-24 (×3): qty 1

## 2023-01-24 NOTE — TOC Initial Note (Signed)
 Transition of Care [facility]) - Initial/Assessment Note   Patient Details  Name: Anna Finley MRN: [id number] Date of Birth: 08-20-1945  Transition of Care [facility]) CM/SW Contact:    Duwaine GORMAN Aran, LCSW Phone Number: 01/24/2023, 11:55 AM  Clinical Narrative: PT/OT evaluation recommended SNF. CSW met with patient to discuss recommendations. Patient is agreeable to rehab. FL2 done; PASRR received. Initial referral faxed in hub. TOC awaiting bed offers.  Expected Discharge Plan: Skilled Nursing Facility Barriers to Discharge: Continued Medical Work up, SNF Pending bed offer, Insurance Authorization  Patient Goals and CMS Choice Patient states their goals for this hospitalization and ongoing recovery are:: Go to rehab CMS Medicare.gov Compare Post Acute Care list provided to:: Patient Choice offered to / list presented to : Patient  Expected Discharge Plan and Services In-house Referral: Clinical Social Work Post Acute Care Choice: Skilled Nursing Facility Living arrangements for the past 2 months: Single Family Home           DME Arranged: N/A DME Agency: NA  Prior Living Arrangements/Services Living arrangements for the past 2 months: Single Family Home Lives with:: Adult Children Patient language and need for interpreter reviewed:: Yes Do you feel safe going back to the place where you live?: Yes      Need for Family Participation in Patient Care: No (Comment) Care giver support system in place?: Yes (comment) Criminal Activity/Legal Involvement Pertinent to Current Situation/Hospitalization: No - Comment as needed  Activities of Daily Living ADL Screening (condition at time of admission) Independently performs ADLs?: No Does the patient have a NEW difficulty with bathing/dressing/toileting/self-feeding that is expected to last >3 days?: Yes (Initiates electronic notice to provider for possible OT consult) Does the patient have a NEW difficulty with getting in/out of bed, walking, or  climbing stairs that is expected to last >3 days?: Yes (Initiates electronic notice to provider for possible PT consult) Does the patient have a NEW difficulty with communication that is expected to last >3 days?: Yes (Initiates electronic notice to provider for possible SLP consult) Is the patient deaf or have difficulty hearing?: No Does the patient have difficulty seeing, even when wearing glasses/contacts?: No Does the patient have difficulty concentrating, remembering, or making decisions?: No  Permission Sought/Granted Permission sought to share information with : Facility Industrial/product Designer granted to share information with : Yes, Verbal Permission Granted Permission granted to share info w AGENCY: SNFs  Emotional Assessment Appearance:: Appears older than stated age Attitude/Demeanor/Rapport: Engaged Affect (typically observed): Accepting Orientation: : Oriented to Self, Oriented to Place, Oriented to  Time, Oriented to Situation Alcohol / Substance Use: Not Applicable Psych Involvement: No (comment)  Admission diagnosis:  Hypokalemia [E87.6] Sepsis due to pneumonia (HCC) [J18.9, A41.9] Community acquired pneumonia of right lung, unspecified part of lung [J18.9] Cardiac arrest [facility]) [I46.9] Patient Active Problem List   Diagnosis Date Noted   Acute combined systolic and diastolic heart failure (HCC) 01/23/2023   Acute respiratory failure with hypoxia (HCC) 01/20/2023   Aspiration pneumonia (HCC) 01/20/2023   Cardiac arrest (HCC) 01/17/2023   Aspiration pneumonia of right lower lobe (HCC) 01/17/2023   Prolonged QT interval 01/17/2023   Shock (HCC) 01/17/2023   Sepsis due to pneumonia (HCC) 01/16/2023   Encounter for antineoplastic immunotherapy 05/05/2021   Hyponatremia 04/27/2021   Headache 04/26/2021   Goals of care, counseling/discussion 04/22/2021   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 04/21/2021   Cancer related pain  04/21/2021   Hemoptysis 04/21/2021   Hypokalemia 04/20/2021  Protein-calorie malnutrition, severe 04/20/2021   Bilateral leg weakness 04/19/2021   Spinal cord compression (HCC) 04/19/2021   Malignant neoplasm of lower lobe of right lung (HCC) 04/19/2021   COPD (chronic obstructive pulmonary disease) with chronic bronchitis (HCC) 04/19/2021   HTN (hypertension) 04/19/2021   PCP:  [name], MD Pharmacy:   [facility] [phone] - [place], [place] - [address] AT [facility] OF [address] [place] [address] Phone: [phone] Fax: [phone]  Social Drivers of Health (SDOH) Social History: SDOH Screenings   Food Insecurity: No Food Insecurity (01/16/2023)  Housing: Low Risk  (01/17/2023)  Transportation Needs: No Transportation Needs (01/17/2023)  Utilities: Not At Risk (01/17/2023)  Social Connections: Moderately Integrated (01/17/2023)  Tobacco Use: Medium Risk (01/16/2023)   SDOH Interventions: Housing Interventions: Intervention Not Indicated Social Connections Interventions: Intervention Not Indicated  Readmission Risk Interventions     No data to display

## 2023-01-24 NOTE — Progress Notes (Signed)
 Patient Name: Anna Finley Date of Encounter: 01/24/2023 [facility] Cardiologist: Annabella Scarce, MD   Interval Summary  .    Feeling well.  Denies chest pain or shortness of breath.   Vital Signs .    Vitals:   01/24/23 0900 01/24/23 1000 01/24/23 1100 01/24/23 1200  BP: (!) 149/72 (!) 162/78    Pulse:  98    Resp: (!) 25 (!) 22 (!) 24   Temp: 98.6 F (37 C) 98.8 F (37.1 C) 99 F (37.2 C)   TempSrc:    Bladder  SpO2:  98%  98%  Weight:      Height:        Intake/Output Summary (Last 24 hours) at 01/24/2023 1222 Last data filed at 01/24/2023 1022 Gross per 24 hour  Intake 625 ml  Output 1450 ml  Net -825 ml      01/22/2023    6:05 AM 01/21/2023    5:22 AM 01/20/2023    5:29 AM  Last 3 Weights  Weight (lbs) 98 lb 15.8 oz 96 lb 9 oz 100 lb 15.5 oz  Weight (kg) 44.9 kg 43.8 kg 45.8 kg      Telemetry/ECG    Sinus rhythm.  PVCs.  2 beats NSVT.  - Personally Reviewed  Physical Exam .    VS:  BP (!) 162/78   Pulse 98   Temp 99 F (37.2 C)   Resp (!) 24   Ht 5' 5 (1.651 m)   Wt 44.9 kg   SpO2 98%   BMI 16.47 kg/m  , BMI Body mass index is 16.47 kg/m. GENERAL:  Frail.  No acute distress.  HEENT: Pupils equal round and reactive, fundi not visualized, oral mucosa unremarkable NECK:  No jugular venous distention, waveform within normal limits, carotid upstroke brisk and symmetric, no bruits, no thyromegaly LUNGS:  Clear to auscultation bilaterally HEART:  RRR.  PMI not displaced or sustained,S1 and S2 within normal limits, no S3, no S4, no clicks, no rubs, no murmurs ABD:  Flat, positive bowel sounds normal in frequency in pitch, no bruits, no rebound, no guarding, no midline pulsatile mass, no hepatomegaly, no splenomegaly EXT:  2 plus pulses throughout, no edema, no cyanosis no clubbing SKIN:  No rashes no nodules NEURO:  Cranial nerves II through XII grossly intact, motor grossly intact throughout PSYCH:  Cognitively intact, oriented to person  place and time   Assessment & Plan .     Ms. Paquette is a 32F with NSCLC stage IV, prior tobacco abuse, COPD, hypertension, right IJ thrombus, and anemia of chronic disease admitted with cough and shortness of breath x 3 days.  She was diagnosed with right lower lobe pneumonia.  In the ED she had a cardiopulmonary arrest.  She was found to have a shockable rhythm and underwent CPR for an unclear duration of time.     # Acute systolic and diastolic HF:  # CAD:  # Acute on chronic anemia: Post extubation echo revealed LVEF 30-35%.  At home she reports that at baseline she ambulates with a walker.  She has not had any chest pain or pressure.  Her only chest pain now is with inspiration consistent with CPR.  On my review of her chest CT she does have some coronary calcifications.  Typically would recommend cardiac catheterization to assess for obstructive coronary disease.  However, she is a very good candidate for this.  She does not seem to fully understand her current medical  condition.  Also concerned about stent placement when her hemoglobin has been as low as 6.9 this admission.  It was 11.3 on 10/2022.  Would recommend trying to keep her hemoglobin above 8 given that she has some underlying coronary disease and heart failure as well.  Hgb increased to 9.9 from 7.8 after transfusion of 1u pRBC.  Anemia evaluation recommended by primary team.  It seems that her primary event was respiratory and cardiac.  Avoid QT prolonging medication and maintain K>4, Mg >2.    # Hyperlipidemia:  LDL 152 this admission.  Recommend rosuvastatin  20mg .  Check fasting lipids/CMP in 2-3 months.   # Acute on chronic anemia: Normocytic anemia.  Hgb increased to 9.9 from 7.8 after transfusion of 1u pRBC.  Anemia evaluation recommended by primary team.  She denies melena or hematochezia.  Recommend reducing aspirin  to 81mg  from 325mg .  She completed her course of Eliquis  for internal jugular thrombus.   # CAP: Management  per primary team.   [facility] will sign off.   Medication Recommendations:  Start rosuvastatin .  Start SGLT2  Other recommendations (labs, testing, etc):  Echo in 3 months.   Follow up as an outpatient:  we will arrange    For questions or updates, please contact [facility] Please consult [email or web address] for contact info under        Signed, Annabella Scarce, MD

## 2023-01-24 NOTE — Telephone Encounter (Signed)
 Pharmacy Patient Advocate Encounter  Insurance verification completed.    The patient is insured through [facility]. Patient has Medicare and is not eligible for a copay card, but may be able to apply for patient assistance or Medicare RX Payment Plan (Patient Must reach out to their plan, if eligible for payment plan), if available.    Ran test claim for Doreen and the current 30 day co-pay is $0.00.  Ran test claim for Jardiance  and the current 30 day co-pay is $0.00.  This test claim was processed through [facility]- copay amounts may vary at other pharmacies due to pharmacy/plan contracts, or as the patient moves through the different stages of their insurance plan.

## 2023-01-24 NOTE — Progress Notes (Signed)
 This RN noted 16 beat run of Vtach on monitor. Entered patients room, where patient was resting in chair. Patient denied chest pain but did note some shortness of breath. Respiratory rate increased from baseline to 32 breaths per min. 2LNC placed on patient. Donnamarie MD and Raford MD notified. EKG completed and placed in patient chart. No new orders.

## 2023-01-24 NOTE — Progress Notes (Addendum)
 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 She was dx with RLL PNA and started on Zithromax  and Rocephin . On 12/31 while in the ED, pt had a cardiopulmonary arrest, was resuscitated and possibly achieved ROSC between 6 to 15 minutes post resuscitation.  Noted to have possible V-fib reading at some point, with prolonged QTc.  Patient was subsequently intubated and was admitted in the ICU for further management.  Patient was extubated on 01/21/23.  [facility]ist assumed care 01/22/2023.     Today, still reporting some chest wall tenderness, taking shallow breaths, hence tachypnea..    Assessment/Plan: Principal Problem:   Sepsis due to pneumonia [facility]) Active Problems:   Malignant neoplasm of lower lobe of right lung (HCC)   Hypokalemia   Cardiac arrest (HCC)   Aspiration pneumonia of right lower lobe (HCC)   Prolonged QT interval   Shock (HCC)   Acute respiratory failure with hypoxia (HCC)   Aspiration pneumonia (HCC)   Acute combined systolic and diastolic heart failure (HCC)   In-hospital cardiac arrest Possible cardiopulmonary arrest Noted to be hypoxic with some noted arrhythmias V-fib/prolonged Qtc Intubated on 12/31, extubated on 01/21/2023 Currently saturating well on RA  RLL pneumonia versus aspiration versus postobstructive Right pleural effusion Currently on RA Currently afebrile, with no leukocytosis CTA chest on 12/31 negative for PE, showed moderate right pleural effusion, noted multiple bilateral minimally displaced rib fractures likely from CPR, noted possible worsening pneumonia versus worsening malignancy Completed  doxycycline , Rocephin  Continue nebulizers Completed steroids  Acute hypoxic respiratory failure COPD with possible exacerbation Management as above Supplemental O2  Acute systolic and diastolic HF Prolonged Qtc Hyperlipidemia Unclear chronicity, no prior echo on file Likely postcardiac arrest Echo done on 12/31 showed EF of 30 to 35%, grade 2 diastolic dysfunction Diuresed intermittently in the ICU Cardiology consulted, appreciate recs, start Farxiga , Crestor  Telemetry, monitor closely  New multiple bilateral minimally displaced rib fractures  Likely from CPR Pain management, incentive spirometry PT/OT  Normocytic anemia/Anemia of chronic disease/critical illness Baseline hemoglobin around 11 Transfuse 1 PRBC on 01/23/2023 Daily CBC  History of hypertension BP soft Holding home atenolol   History of nonocclusive RIJ thrombus Was on heparin  in the ICU Note from oncology in August 2023 states she had completed Eliquis  Continue DVT Lovenox  Ppx  Left hip fracture s/p ORIF on 12/16/2022 PT/OT  Non-small cell lung cancer stage IV On Tagrisso , currently on hold Outpatient oncology follow-up  Pressure injury  Pressure Injury 01/16/23 Heel Left;Posterior Unstageable - Full thickness tissue loss in which the base of the injury is covered by slough (yellow, tan, gray, green or brown) and/or eschar (tan, brown or black) in the wound bed. Blackened skin on bony pr (Active)  01/16/23 1745  Location: Heel  Location Orientation: Left;Posterior  Staging: Unstageable - Full thickness tissue loss in which the base of the injury is covered by slough (yellow, tan, gray, green or brown) and/or eschar (tan, brown or black) in the wound bed.  Wound Description (Comments): Blackened skin on bony prominence of posterior heel  Present on Admission: Yes  Dressing Type Foam - Lift dressing to assess site every shift 01/24/23 0800   Malnutrition Type:  Nutrition Problem: Severe  Malnutrition Etiology: chronic illness (NSCLC stage IV-b; COPD)   Malnutrition Characteristics:  Signs/Symptoms: severe fat depletion, severe muscle depletion   Nutrition Interventions:  Interventions: Refer to RD note for recommendations    Estimated body mass index is 16.47 kg/m as calculated from the following:   Height as of this encounter: 5' 5 (1.651 m).   Weight as of this encounter: 44.9 kg.     Code Status: Full  Family Communication: None at bedside  Disposition Plan: Status is: Inpatient Remains inpatient appropriate because: Level of care      Consultants: PCCM Cardiology   Procedures: Mechanical ventilation  Antimicrobials: None  DVT prophylaxis: Lovenox    Objective: Vitals:   01/24/23 0900 01/24/23 1000 01/24/23 1100 01/24/23 1200  BP: (!) 149/72 (!) 162/78    Pulse:  98    Resp: (!) 25 (!) 22 (!) 24   Temp: 98.6 F (37 C) 98.8 F (37.1 C) 99 F (37.2 C)   TempSrc:    Bladder  SpO2:  98%  98%  Weight:      Height:        Intake/Output Summary (Last 24 hours) at 01/24/2023 1452 Last data filed at 01/24/2023 1022 Gross per 24 hour  Intake 625 ml  Output 1450 ml  Net -825 ml   Filed Weights   01/20/23 0529 01/21/23 0522 01/22/23 0605  Weight: 45.8 kg 43.8 kg 44.9 kg    Exam: General: NAD, alert, awake Cardiovascular: S1, S2 present Respiratory: Diminished breath sounds bilaterally, chest tenderness Abdomen: Soft, nontender, nondistended, bowel sounds present Musculoskeletal: No bilateral pedal edema noted Skin: Normal Psychiatry: Normal mood     Data Reviewed: CBC: Recent Labs  Lab 01/20/23 0330 01/21/23 0500 01/21/23 1201 01/22/23 0313 01/23/23 0311 01/24/23 0307  WBC 10.3 8.9  --  11.3* 7.8 8.2  NEUTROABS  --   --   --   --  5.6 5.9  HGB 7.0* 6.9* 7.8* 7.5* 7.8* 9.9*  HCT 23.4* 22.7* 25.6* 25.3* 26.7* 30.8*  MCV 91.1 88.3  --  89.1 90.2 86.8  PLT 311 366  --  386 423* 412*   Basic Metabolic Panel: Recent  Labs  Lab 01/17/23 1759 01/18/23 0737 01/18/23 1118 01/19/23 0551 01/20/23 0330 01/21/23 0500 01/22/23 0313 01/23/23 0311 01/24/23 0307  NA 132* 134* 133* 130* 134* 136 133* 134* 131*  K 3.8 3.6 3.6 3.2* 5.3* 4.8 4.7 4.7 4.1  CL 98 100 100 98 101 96* 91* 93* 95*  CO2 25 24 23 24 24 32 32 32 26   GLUCOSE 159* 167* 155* 112* 141* 117* 98 102* 111*  BUN 17 19 20 22  26* 31* 25* 18 14  CREATININE 0.77 0.93 0.84 0.88 0.61 0.60 0.54 0.56 0.58  CALCIUM  8.1* 8.5* 8.1* 8.0* 8.2* 8.5* 8.7* 8.6* 8.6*  MG 2.3 2.4 2.2 2.3  --   --  1.7 2.1 2.3  PHOS 3.6 3.7 3.8  --   --   --   --   --   --    GFR: Estimated Creatinine Clearance: 42.4 mL/min (by C-G formula based on SCr of 0.58 mg/dL). Liver Function Tests: Recent Labs  Lab 01/17/23 1759 01/18/23 0737 01/18/23 1118 01/19/23 0551  AST 77* 44* 39 26  ALT 42 33 31 27  ALKPHOS 145* 138* 122 126  BILITOT 0.4 0.5 0.3 0.3  PROT 5.7* 5.9* 5.4* 5.7*  ALBUMIN 2.3* 2.4* 2.2* 2.3*   No results for input(s): LIPASE, AMYLASE in the  last 168 hours. No results for input(s): AMMONIA in the last 168 hours. Coagulation Profile: No results for input(s): INR, PROTIME in the last 168 hours.  Cardiac Enzymes: No results for input(s): CKTOTAL, CKMB, CKMBINDEX, TROPONINI in the last 168 hours. BNP (last 3 results) No results for input(s): PROBNP in the last 8760 hours. HbA1C: No results for input(s): HGBA1C in the last 72 hours. CBG: Recent Labs  Lab 01/23/23 1920 01/23/23 2305 01/24/23 0309 01/24/23 0801 01/24/23 1148  GLUCAP 159* 141* 108* 113* 163*   Lipid Profile: Recent Labs    01/24/23 0307  CHOL 232*  HDL 62  LDLCALC 151*  TRIG 97  CHOLHDL 3.7   Thyroid  Function Tests: No results for input(s): TSH, T4TOTAL, FREET4, T3FREE, THYROIDAB in the last 72 hours. Anemia Panel: No results for input(s): VITAMINB12, FOLATE, FERRITIN, TIBC, IRON, RETICCTPCT in the last 72 hours. Urine  analysis: No results found for: COLORURINE, APPEARANCEUR, LABSPEC, PHURINE, GLUCOSEU, HGBUR, BILIRUBINUR, KETONESUR, PROTEINUR, UROBILINOGEN, NITRITE, LEUKOCYTESUR Sepsis Labs: @LABRCNTIP (procalcitonin:4,lacticidven:4)  ) Recent Results (from the past 240 hours)  Culture, blood (Routine x 2)     Status: None   Collection Time: 01/16/23 10:28 AM   Specimen: BLOOD LEFT ARM  Result Value Ref Range Status   Specimen Description   Final    BLOOD LEFT ARM Performed at [facility], [address] [place], [place] [address]    Special Requests   Final    BOTTLES DRAWN AEROBIC AND ANAEROBIC Blood Culture results may not be optimal due to an inadequate volume of blood received in culture bottles Performed at [facility], [address] [place], [place] [address]    Culture   Final    NO GROWTH 5 DAYS Performed at [facility], [address] [place], [place] [address]    Report Status 01/21/2023 FINAL  Final  Resp panel by RT-PCR (RSV, Flu A&B, Covid) Anterior Nasal Swab     Status: None   Collection Time: 01/16/23 11:43 AM   Specimen: Anterior Nasal Swab  Result Value Ref Range Status   SARS Coronavirus 2 by RT PCR NEGATIVE NEGATIVE Final    Comment: (NOTE) SARS-CoV-2 target nucleic acids are NOT DETECTED.  The SARS-CoV-2 RNA is generally detectable in upper respiratory specimens during the acute phase of infection. The lowest concentration of SARS-CoV-2 viral copies this assay can detect is 138 copies/mL. A negative result does not preclude SARS-Cov-2 infection and should not be used as the sole basis for treatment or other patient management decisions. A negative result may occur with  improper specimen collection/handling, submission of specimen other than nasopharyngeal swab, presence of viral mutation(s) within the areas targeted by this assay, and inadequate number of viral copies(<138 copies/mL). A negative result must be  combined with clinical observations, patient history, and epidemiological information. The expected result is Negative.  Fact Sheet for Patients:  bloggercourse.com  Fact Sheet for Healthcare Providers:  seriousbroker.it  This test is no t yet approved or cleared by the [place]  FDA and  has been authorized for detection and/or diagnosis of SARS-CoV-2 by FDA under an Emergency Use Authorization (EUA). This EUA will remain  in effect (meaning this test can be used) for the duration of the COVID-19 declaration under Section 564(b)(1) of the Act, 21 U.S.C.section 360bbb-3(b)(1), unless the authorization is terminated  or revoked sooner.       Influenza A by PCR NEGATIVE NEGATIVE Final   Influenza B by PCR NEGATIVE  NEGATIVE Final    Comment: (NOTE) The Xpert Xpress SARS-CoV-2/FLU/RSV plus assay is intended as an aid in the diagnosis of influenza from Nasopharyngeal swab specimens and should not be used as a sole basis for treatment. Nasal washings and aspirates are unacceptable for Xpert Xpress SARS-CoV-2/FLU/RSV testing.  Fact Sheet for Patients: bloggercourse.com  Fact Sheet for Healthcare Providers: seriousbroker.it  This test is not yet approved or cleared by the [place]  FDA and has been authorized for detection and/or diagnosis of SARS-CoV-2 by FDA under an Emergency Use Authorization (EUA). This EUA will remain in effect (meaning this test can be used) for the duration of the COVID-19 declaration under Section 564(b)(1) of the Act, 21 U.S.C. section 360bbb-3(b)(1), unless the authorization is terminated or revoked.     Resp Syncytial Virus by PCR NEGATIVE NEGATIVE Final    Comment: (NOTE) Fact Sheet for Patients: bloggercourse.com  Fact Sheet for Healthcare Providers: seriousbroker.it  This test is not yet  approved or cleared by the [place]  FDA and has been authorized for detection and/or diagnosis of SARS-CoV-2 by FDA under an Emergency Use Authorization (EUA). This EUA will remain in effect (meaning this test can be used) for the duration of the COVID-19 declaration under Section 564(b)(1) of the Act, 21 U.S.C. section 360bbb-3(b)(1), unless the authorization is terminated or revoked.  Performed at [facility], [address] [place], [place] [address]   MRSA Next Gen by PCR, Nasal     Status: None   Collection Time: 01/17/23  1:21 AM   Specimen: Nasal Mucosa; Nasal Swab  Result Value Ref Range Status   MRSA by PCR Next Gen NOT DETECTED NOT DETECTED Final    Comment: (NOTE) The GeneXpert MRSA Assay (FDA approved for NASAL specimens only), is one component of a comprehensive MRSA colonization surveillance program. It is not intended to diagnose MRSA infection nor to guide or monitor treatment for MRSA infections. Test performance is not FDA approved in patients less than 62 years old. Performed at [facility], [address] [place], [place] [address]   Culture, Respiratory w Gram Stain     Status: None   Collection Time: 01/17/23  9:02 AM   Specimen: Tracheal Aspirate; Respiratory  Result Value Ref Range Status   Specimen Description   Final    TRACHEAL ASPIRATE Performed at [facility], [address], [place], [place] [address]    Special Requests   Final    NONE Performed at [facility], [address], [place], [place] [address]    Gram Stain   Final    ABUNDANT WBC PRESENT, PREDOMINANTLY PMN NO ORGANISMS SEEN    Culture   Final    RARE Normal respiratory flora-no Staph aureus or Pseudomonas seen Performed at [facility], [address], [place], [place] [address]    Report Status 01/19/2023 FINAL  Final      Studies: No results found.  Scheduled Meds:  arformoterol   15 mcg  Nebulization BID   [START ON 01/25/2023] aspirin  EC  81 mg Oral Daily   budesonide  (PULMICORT ) nebulizer solution  0.5 mg Nebulization BID   Chlorhexidine  Gluconate Cloth  6 each Topical Daily   dapagliflozin  propanediol  10 mg Oral Daily   enoxaparin  (LOVENOX ) injection  30 mg Subcutaneous Daily   feeding supplement  1 Container Oral TID BM   insulin  aspart  0-6 Units Subcutaneous Q4H   lidocaine   1 patch Transdermal Q24H   [  START ON 01/25/2023] multivitamin with minerals  1 tablet Oral Daily   polyethylene glycol  17 g Oral Daily   revefenacin   175 mcg Nebulization Daily   rosuvastatin   20 mg Oral Daily   sodium chloride  flush  3 mL Intravenous Q12H    Continuous Infusions:     LOS: 8 days     Lebron JINNY Cage, MD [facility]  If 7PM-7AM, please contact night-coverage [email or web address] 01/24/2023, 2:52 PM

## 2023-01-24 NOTE — Progress Notes (Addendum)
 Physical Therapy Treatment Patient Details Name: Anna Finley MRN: [id number] DOB: 1945-03-28 Today's Date: 01/24/2023   History of Present Illness Patient is a 78 year old female who was admitted on 12/30 with cough and worsening SOB. She was dx with RLL PNA . On 12/31 while in the ED, pt had a cardiopulmonary arrest, was resuscitated and possibly achieved ROSC between 6 to 15 minutes post resuscitation. Noted to have possible V-fib reading at some point, with prolonged QTc. Patient was subsequently intubated and was admitted in the ICU for further management. Patient was extubated on 01/21/23.   PMH: NSCLC, COPD, right IJ thrombus, HTN, anemia, left hip fracture status post ORIF 12/16/2022, and severe PCM   PT Comments  The patient is alert, telling therapist that she has a great grandson just born today.  Patient required mod assistance to  mobilize, 2  persons to stand and pivot to recliner with RW.  Noted patient's knee hyperextend, patient trunk forward flexed.  Patient tolerated well. Patient on RA,  sats 98%, HR 113 Patient will benefit from continued inpatient follow up therapy, <3 hours/day    If plan is discharge home, recommend the following: A lot of help with walking and/or transfers;A lot of help with bathing/dressing/bathroom;Help with stairs or ramp for entrance;Assist for transportation;Assistance with cooking/housework;Supervision due to cognitive status   Can travel by private vehicle     No  Equipment Recommendations  None recommended by PT    Recommendations for Other Services       Precautions / Restrictions Precautions Precautions: Fall Precaution Comments: monitor VS Restrictions Weight Bearing Restrictions Per Provider Order: No     Mobility  Bed Mobility Overal bed mobility: Needs Assistance Bed Mobility: Supine to Sit Rolling: Mod assist         General bed mobility comments: cues to initiate  moving legs, assist to scoot to bed edge, able to sit  upright    Transfers Overall transfer level: Needs assistance Equipment used: Rolling walker (2 wheels) Transfers: Sit to/from Stand, Bed to chair/wheelchair/BSC Sit to Stand: Mod assist, +2 safety/equipment, +2 physical assistance   Step pivot transfers: Mod assist, +2 physical assistance, +2 safety/equipment       General transfer comment: patient able to  stnad with support at RW, noted knees hyperextending, shuffle steps to turn to recliner, cues to back up.    Ambulation/Gait                   Stairs             Wheelchair Mobility     Tilt Bed    Modified Rankin (Stroke Patients Only)       Balance Overall balance assessment: History of Falls, Needs assistance Sitting-balance support: Bilateral upper extremity supported, Feet supported Sitting balance-Leahy Scale: Fair     Standing balance support: Bilateral upper extremity supported, During functional activity, Reliant on assistive device for balance Standing balance-Leahy Scale: Poor                              Cognition Arousal: Alert Behavior During Therapy: WFL for tasks assessed/performed Overall Cognitive Status: No family/caregiver present to determine baseline cognitive functioning Area of Impairment: Orientation, Memory, Following commands                 Orientation Level: Disoriented to, Place, Time, Situation   Memory: Decreased short-term memory Following Commands: Follows one step commands with increased time  General Comments: follows  directions with increased time and repetition        Exercises      General Comments        Pertinent Vitals/Pain Pain Assessment Pain Assessment: No/denies pain    Home Living                          Prior Function            PT Goals (current goals can now be found in the care plan section) Progress towards PT goals: Progressing toward goals    Frequency    Min 1X/week       PT Plan      Co-evaluation              AM-PAC PT 6 Clicks Mobility   Outcome Measure  Help needed turning from your back to your side while in a flat bed without using bedrails?: A Little Help needed moving from lying on your back to sitting on the side of a flat bed without using bedrails?: A Lot Help needed moving to and from a bed to a chair (including a wheelchair)?: A Lot Help needed standing up from a chair using your arms (e.g., wheelchair or bedside chair)?: A Lot Help needed to walk in hospital room?: Total Help needed climbing 3-5 steps with a railing? : Total 6 Click Score: 11    End of Session   Activity Tolerance: Patient tolerated treatment well Patient left: in chair;with call bell/phone within reach;with chair alarm set Nurse Communication: Mobility status PT Visit Diagnosis: Other abnormalities of gait and mobility (R26.89);Difficulty in walking, not elsewhere classified (R26.2);Muscle weakness (generalized) (M62.81)     Time: 8478-8455 PT Time Calculation (min) (ACUTE ONLY): 23 min  Charges:    $Therapeutic Activity: 23-37 mins PT General Charges $$ ACUTE PT VISIT: 1 Visit                     Darice Potters PT Acute Rehabilitation Services Office [phone] Weekend [phone]   Potters Darice Norris 01/24/2023, 4:19 PM

## 2023-01-24 NOTE — Progress Notes (Signed)
 Nutrition Follow-up  DOCUMENTATION CODES:   Severe malnutrition in context of chronic illness  INTERVENTION: - DYS 3 diet per MD.  - Boost Breeze po TID, each supplement provides 250 kcal and 9 grams of protein - Encourage intake at all meals and of supplements.  - Add Multivitamin with minerals daily - Monitor weight trends.   NUTRITION DIAGNOSIS:   Severe Malnutrition related to chronic illness (NSCLC stage IV-b; COPD) as evidenced by severe fat depletion, severe muscle depletion. 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 Found to have sepsis d/t PNA.  12/30 Admit 12/31 cardiac arrest -> intubated; TF initiated 1/4 Extubated  Patient in bed at time of visit, daughter at bedside.  She reports a UBW of 85# and denies any recent changes in weight.  States she was eating like a pig at home with 3 meals a day plus 3 Ensures daily.   Current appetite is good and patient reports eating fairly well.  Documented to be consuming 0-50% of meals.  Ordered Ensure yesterday but patient refused all 3. RN reports patient stated they give her diarrhea.  Patient confirms that although she really likes the Ensures, they don't like her.  She is agreeable to try Boost Breeze for another option to support oral intake.  Discussed increased calorie and protein needs and encouraged intake of 3 meals a day in addition to supplements.   Admit weight: 95# Current weight: 98# I&O's: +956mL  Medications reviewed and include: Miralax   Labs  reviewed:  Na 131   Diet Order:   Diet Order             DIET DYS 3 Room service appropriate? Yes; Fluid consistency: Thin  Diet effective now                   EDUCATION NEEDS:  Not appropriate for education at this time  Skin:  Skin Assessment: Skin Integrity Issues: Skin Integrity Issues:: Unstageable Unstageable: Left heel  Last BM:  1/7 - type 6  Height:  Ht Readings from Last 1 Encounters:  01/18/23 5' 5 (1.651 m)   Weight:  Wt Readings from Last 1 Encounters:  01/22/23 44.9 kg    BMI:  Body mass index is 16.47 kg/m.  Estimated Nutritional Needs:  Kcal:  1200-1400 kcals Protein:  70-80 grams Fluid:  >/= 1.4L    Trude Ned RD, LDN Contact via Secure Chat.

## 2023-01-24 NOTE — Plan of Care (Signed)
  Problem: Health Behavior/Discharge Planning: Goal: Ability to manage health-related needs will improve Outcome: Progressing   Problem: Clinical Measurements: Goal: Diagnostic test results will improve Outcome: Progressing Goal: Respiratory complications will improve Outcome: Progressing   Problem: Activity: Goal: Risk for activity intolerance will decrease Outcome: Progressing   Problem: Nutrition: Goal: Adequate nutrition will be maintained Outcome: Progressing   Problem: Coping: Goal: Level of anxiety will decrease Outcome: Progressing   Problem: Pain Management: Goal: General experience of comfort will improve Outcome: Progressing

## 2023-01-24 NOTE — [place] FL2 (Signed)
 [place]  MEDICAID FL2 LEVEL OF CARE FORM     IDENTIFICATION  Patient Name: Anna Finley Birthdate: [birth year] Sex: female Admission Date (Current Location): 01/16/2023  [facility] and [place] Number:  Producer, Television/film/video and Address:  [facility],  [address], [place] [address]      Provider Number: [id number]  Attending Physician Name and Address:  Donnamarie Lebron PARAS, MD  Relative Name and Phone Number:  Finnleigh Marchetti (daughter) Ph: [phone]    Current Level of Care: Hospital Recommended Level of Care: Skilled Nursing Facility Prior Approval Number:    Date Approved/Denied:   PASRR Number: [id number]  Discharge Plan: SNF    Current Diagnoses: Patient Active Problem List   Diagnosis Date Noted   Acute combined systolic and diastolic heart failure (HCC) 01/23/2023   Acute respiratory failure with hypoxia (HCC) 01/20/2023   Aspiration pneumonia (HCC) 01/20/2023   Cardiac arrest (HCC) 01/17/2023   Aspiration pneumonia of right lower lobe (HCC) 01/17/2023   Prolonged QT interval 01/17/2023   Shock (HCC) 01/17/2023   Sepsis due to pneumonia (HCC) 01/16/2023   Encounter for antineoplastic immunotherapy 05/05/2021   Hyponatremia 04/27/2021   Headache 04/26/2021   Goals of care, counseling/discussion 04/22/2021   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 04/21/2021   Cancer related pain 04/21/2021   Hemoptysis 04/21/2021   Hypokalemia 04/20/2021   Protein-calorie malnutrition, severe 04/20/2021   Bilateral leg weakness 04/19/2021   Spinal cord compression (HCC) 04/19/2021   Malignant neoplasm of lower lobe of right lung (HCC) 04/19/2021   COPD (chronic obstructive pulmonary disease) with chronic bronchitis (HCC) 04/19/2021   HTN (hypertension) 04/19/2021    Orientation RESPIRATION BLADDER Height & Weight     Self, Time, Situation, Place  Normal Incontinent Weight: 98 lb 15.8 oz (44.9 kg) Height:  5' 5 (165.1 cm)   BEHAVIORAL SYMPTOMS/MOOD NEUROLOGICAL BOWEL NUTRITION STATUS      Incontinent Diet (Dysphagia 3 diet)  AMBULATORY STATUS COMMUNICATION OF NEEDS Skin   Extensive Assist Verbally Normal                       Personal Care Assistance Level of Assistance  Bathing, Feeding, Dressing Bathing Assistance: Limited assistance Feeding assistance: Independent Dressing Assistance: Limited assistance     Functional Limitations Info  Sight, Hearing, Speech Sight Info: Impaired Hearing Info: Adequate Speech Info: Adequate    SPECIAL CARE FACTORS FREQUENCY  PT (By licensed PT), OT (By licensed OT)     PT Frequency: 5x's/week OT Frequency: 5x's/week            Contractures Contractures Info: Not present    Additional Factors Info  Code Status, Allergies, Insulin  Sliding Scale Code Status Info: Full Allergies Info: NKA   Insulin  Sliding Scale Info: See discharge summary       Current Medications (01/24/2023):  This is the current hospital active medication list Current Facility-Administered Medications  Medication Dose Route Frequency Provider Last Rate Last Admin   acetaminophen  (TYLENOL ) tablet 650 mg  650 mg Oral Q6H PRN Moody Alto, MD   650 mg at 01/24/23 0132   Or   acetaminophen  (TYLENOL ) suppository 650 mg  650 mg Rectal Q6H PRN Moody Alto, MD       arformoterol  (BROVANA ) nebulizer solution 15 mcg  15 mcg Nebulization BID Bowser, Grace E, NP   15 mcg at 01/24/23 0813   aspirin  tablet 325 mg  325 mg Oral Daily Ezenduka, Nkeiruka J, MD  325 mg at 01/24/23 0930   budesonide  (PULMICORT ) nebulizer solution 0.5 mg  0.5 mg Nebulization BID Bowser, Grace E, NP   0.5 mg at 01/24/23 0813   Chlorhexidine  Gluconate Cloth 2 % PADS 6 each  6 each Topical Daily Gonfa, Taye T, MD   6 each at 01/23/23 2316   enoxaparin  (LOVENOX ) injection 30 mg  30 mg Subcutaneous Daily Olalere, Adewale A, MD   30 mg at 01/24/23 0930   feeding supplement (BOOST / RESOURCE BREEZE) liquid 1 Container  1  Container Oral TID BM Ezenduka, Nkeiruka J, MD   1 Container at 01/24/23 1203   HYDROmorphone  (DILAUDID ) injection 0.5 mg  0.5 mg Intravenous Q4H PRN Ezenduka, Nkeiruka J, MD       insulin  aspart (novoLOG ) injection 0-6 Units  0-6 Units Subcutaneous Q4H Albustami, Omar M, MD   1 Units at 01/24/23 1203   ipratropium-albuterol  (DUONEB) 0.5-2.5 (3) MG/3ML nebulizer solution 3 mL  3 mL Nebulization Q6H PRN Bowser, Grace E, NP   3 mL at 01/21/23 2104   lidocaine  (LIDODERM ) 5 % 1 patch  1 patch Transdermal Q24H Ezenduka, Nkeiruka J, MD   1 patch at 01/24/23 1203   [START ON 01/25/2023] multivitamin with minerals tablet 1 tablet  1 tablet Oral Daily Ezenduka, Nkeiruka J, MD       ondansetron  (ZOFRAN ) injection 4 mg  4 mg Intravenous Q6H PRN Albustami, Mancel HERO, MD       Oral care mouth rinse  15 mL Mouth Rinse PRN Kassie Acquanetta Bradley, MD       oxyCODONE  (Oxy IR/ROXICODONE ) immediate release tablet 5-10 mg  5-10 mg Oral Q4H PRN Ezenduka, Nkeiruka J, MD   10 mg at 01/22/23 1239   polyethylene glycol (MIRALAX  / GLYCOLAX ) packet 17 g  17 g Oral Daily Ezenduka, Nkeiruka J, MD       revefenacin  (YUPELRI ) nebulizer solution 175 mcg  175 mcg Nebulization Daily Bowser, Ronnald BRAVO, NP   175 mcg at 01/24/23 0813   sodium chloride  flush (NS) 0.9 % injection 3 mL  3 mL Intravenous Q12H Moody Alto, MD   3 mL at 01/24/23 9066     Discharge Medications: Please see discharge summary for a list of discharge medications.  Relevant Imaging Results:  Relevant Lab Results:   Additional Information SSN: [id number]  Duwaine GORMAN Aran, LCSW

## 2023-01-25 ENCOUNTER — Other Ambulatory Visit: Payer: Self-pay

## 2023-01-25 ENCOUNTER — Inpatient Hospital Stay (HOSPITAL_COMMUNITY): Payer: 59

## 2023-01-25 DIAGNOSIS — I5041 Acute combined systolic (congestive) and diastolic (congestive) heart failure: Secondary | ICD-10-CM

## 2023-01-25 LAB — CBC WITH DIFFERENTIAL/PLATELET
Abs Immature Granulocytes: 0.06 K/uL (ref 0.00–0.07)
Basophils Absolute: 0 K/uL (ref 0.0–0.1)
Basophils Relative: 0 %
Eosinophils Absolute: 0.2 K/uL (ref 0.0–0.5)
Eosinophils Relative: 2 %
HCT: 31.7 % — ABNORMAL LOW (ref 36.0–46.0)
Hemoglobin: 9.9 g/dL — ABNORMAL LOW (ref 12.0–15.0)
Immature Granulocytes: 1 %
Lymphocytes Relative: 9 %
Lymphs Abs: 1 K/uL (ref 0.7–4.0)
MCH: 27.8 pg (ref 26.0–34.0)
MCHC: 31.2 g/dL (ref 30.0–36.0)
MCV: 89 fL (ref 80.0–100.0)
Monocytes Absolute: 1.2 K/uL — ABNORMAL HIGH (ref 0.1–1.0)
Monocytes Relative: 11 %
Neutro Abs: 8.4 K/uL — ABNORMAL HIGH (ref 1.7–7.7)
Neutrophils Relative %: 77 %
Platelets: 402 K/uL — ABNORMAL HIGH (ref 150–400)
RBC: 3.56 MIL/uL — ABNORMAL LOW (ref 3.87–5.11)
RDW: 16.2 % — ABNORMAL HIGH (ref 11.5–15.5)
WBC: 10.9 K/uL — ABNORMAL HIGH (ref 4.0–10.5)
nRBC: 0 % (ref 0.0–0.2)

## 2023-01-25 LAB — BASIC METABOLIC PANEL
Anion gap: 10 (ref 5–15)
BUN: 12 mg/dL (ref 8–23)
CO2: 24 mmol/L (ref 22–32)
Calcium: 8.1 mg/dL — ABNORMAL LOW (ref 8.9–10.3)
Chloride: 94 mmol/L — ABNORMAL LOW (ref 98–111)
Creatinine, Ser: 0.51 mg/dL (ref 0.44–1.00)
GFR, Estimated: >60 mL/min (ref >60)
Glucose, Bld: 116 mg/dL — ABNORMAL HIGH (ref 70–99)
Potassium: 4.1 mmol/L (ref 3.5–5.1)
Sodium: 128 mmol/L — ABNORMAL LOW (ref 135–145)

## 2023-01-25 LAB — GLUCOSE, CAPILLARY
Glucose-Capillary: 109 mg/dL — ABNORMAL HIGH (ref 70–99)
Glucose-Capillary: 103 mg/dL — ABNORMAL HIGH (ref 70–99)
Glucose-Capillary: 168 mg/dL — ABNORMAL HIGH (ref 70–99)
Glucose-Capillary: 87 mg/dL (ref 70–99)
Glucose-Capillary: 97 mg/dL (ref 70–99)

## 2023-01-25 LAB — BRAIN NATRIURETIC PEPTIDE: B Natriuretic Peptide: 501.2 pg/mL — ABNORMAL HIGH (ref 0.0–100.0)

## 2023-01-25 LAB — MAGNESIUM: Magnesium: 2.1 mg/dL (ref 1.7–2.4)

## 2023-01-25 MED ORDER — FUROSEMIDE 10 MG/ML IJ SOLN
40.0000 mg | Freq: Once | INTRAMUSCULAR | Status: AC
  Administered 2023-01-25: 40 mg via INTRAVENOUS
  Filled 2023-01-25: qty 4

## 2023-01-25 MED ORDER — METOPROLOL SUCCINATE ER 25 MG PO TB24
25.0000 mg | ORAL_TABLET | Freq: Every day | ORAL | Status: DC
  Administered 2023-01-25 – 2023-01-27 (×3): 25 mg via ORAL
  Filled 2023-01-25 (×3): qty 1

## 2023-01-25 NOTE — Progress Notes (Signed)
 Patient Name: Anna Finley Date of Encounter: 01/25/2023 [facility] Cardiologist: Annabella Scarce, MD   Interval Summary  .    Feeling well.  Denies chest pain or shortness of breath. Has not ambulated.  She notes that she does not use supplemental oxygen at home.   Vital Signs .    Vitals:   01/25/23 0400 01/25/23 0500 01/25/23 0752 01/25/23 0800  BP: (!) 94/52   130/70  Pulse:    87  Resp: (!) 24   18  Temp: (!) 100.6 F (38.1 C)   99.9 F (37.7 C)  TempSrc:    Bladder  SpO2:   100% 98%  Weight:  39.9 kg    Height:        Intake/Output Summary (Last 24 hours) at 01/25/2023 1119 Last data filed at 01/25/2023 0955 Gross per 24 hour  Intake 800 ml  Output 1150 ml  Net -350 ml      01/25/2023    5:00 AM 01/22/2023    6:05 AM 01/21/2023    5:22 AM  Last 3 Weights  Weight (lbs) 87 lb 15.4 oz 98 lb 15.8 oz 96 lb 9 oz  Weight (kg) 39.9 kg 44.9 kg 43.8 kg      Telemetry/ECG    Sinus rhythm.  PVCs.  2 beats NSVT.  - Personally Reviewed  Physical Exam .    VS:  BP 130/70 (BP Location: Left Arm)   Pulse 87   Temp 99.9 F (37.7 C) (Bladder)   Resp 18   Ht 5' 5 (1.651 m)   Wt 39.9 kg   SpO2 98%   BMI 14.64 kg/m  , BMI Body mass index is 14.64 kg/m. GENERAL:  Frail.  No acute distress.  HEENT: Pupils equal round and reactive, fundi not visualized, oral mucosa unremarkable NECK:  No jugular venous distention, waveform within normal limits, carotid upstroke brisk and symmetric, no bruits, no thyromegaly LUNGS:  Diminished breath sounds.  HEART:  RRR.  PMI not displaced or sustained,S1 and S2 within normal limits, no S3, no S4, no clicks, no rubs, no murmurs ABD:  Flat, positive bowel sounds normal in frequency in pitch, no bruits, no rebound, no guarding, no midline pulsatile mass, no hepatomegaly, no splenomegaly EXT:  2 plus pulses throughout, no edema, no cyanosis no clubbing SKIN:  No rashes no nodules NEURO:  Cranial nerves II through XII grossly  intact, motor grossly intact throughout PSYCH:  Cognitively intact, oriented to person place and time   Assessment & Plan .     Anna Finley is a 51F with NSCLC stage IV, prior tobacco abuse, COPD, hypertension, right IJ thrombus, and anemia of chronic disease admitted with cough and shortness of breath x 3 days.  She was diagnosed with right lower lobe pneumonia.  In the ED she had a cardiopulmonary arrest.  She was found to have a shockable rhythm and underwent CPR for an unclear duration of time.     # Acute systolic and diastolic HF:  # CAD:  # Acute on chronic anemia: Post extubation echo revealed LVEF 30-35%.  At home she reports that at baseline she ambulates with a walker.  She has not had any chest pain or pressure.  Her only chest pain now is with inspiration consistent with CPR.  On my review of her chest CT she does have some coronary calcifications.  Typically would recommend cardiac catheterization to assess for obstructive coronary disease.  However, she is a very good  candidate for this.  She does not seem to fully understand her current medical condition.  I'm also concerned about stent placement when her hemoglobin has been as low as 6.9 this admission.  It was 11.3 on 10/2022.  Would recommend trying to keep her hemoglobin above 8 given that she has some underlying coronary disease and heart failure as well.  Hgb increased to 9.9 from 7.8 after transfusion of 1u pRBC and has been stable.  Anemia evaluation recommended by primary team.  It seems that her primary event was respiratory and not cardiac.  Avoid QT prolonging medication and maintain K>4, Mg >2.  BP improved.  Add metoprolol  succinate 25mg  daily.  She has persistently diminished breath sounds and increased O2 requirement from baseline.  Also increasingly hyponatremic.  Will check CXR and BNP. Give a dose of lasix  40mg  IV. Started Farxiga  10mg  daily.  # NSVT:  Asymptomatic.  Add metoprolol  succinate 25mg  daily.   #  Hyperlipidemia:  LDL 152 this admission.  Recommend rosuvastatin  20mg .  Check fasting lipids/CMP in 2-3 months.   # Acute on chronic anemia: Normocytic anemia.  Hgb increased to 9.9 from 7.8 after transfusion of 1u pRBC.  Anemia evaluation recommended by primary team.  She denies melena or hematochezia.  Recommend reducing aspirin  to 81mg  from 325mg .  She completed her course of Eliquis  for internal jugular thrombus.   # CAP: Management per primary team.     For questions or updates, please contact [facility] Please consult [email or web address] for contact info under        Signed, Annabella Scarce, MD

## 2023-01-25 NOTE — TOC Progression Note (Signed)
 Transition of Care [facility]) - Progression Note   Patient Details  Name: Anna Finley MRN: [id number] Date of Birth: [birth year]  Transition of Care [facility] CM/SW Contact  Duwaine GORMAN Aran, LCSW Phone Number: 01/25/2023, 10:13 AM  Clinical Narrative: CSW provided patient with the following bed offers:  [facility] [address] [place], [place] [address] [phone] Overall rating ??  Below average  [facility] [address] [place], [place] [address] [phone] Overall rating ?? Below average  [facility] [address] [place], [place] [address] [phone] Overall rating ? Much below average  [facility] [address] [place], [place] [address] [phone] Overall rating ????? Much above average  [facility] [address] [place], [place] [address] [phone] Overall rating ???? Above average  [facility] [address] [place], [place] [address] [phone] Overall rating? Below average  Patient requested to review the bed offers with her daughter when she comes to the hospital today.  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  Expected Discharge Plan and Services In-house Referral: Clinical Social Work Post Acute Care Choice: Skilled Nursing Facility Living arrangements for the past 2 months: Single Family Home           DME Arranged: N/A DME Agency: NA  Social Determinants of Health (SDOH) Interventions SDOH Screenings   Food Insecurity: No Food Insecurity (01/16/2023)  Housing: Low Risk  (01/17/2023)  Transportation Needs: No Transportation Needs (01/17/2023)  Utilities: Not At Risk (01/17/2023)  Social Connections: Moderately Integrated (01/17/2023)  Tobacco Use: Medium Risk  (01/16/2023)   Readmission Risk Interventions     No data to display

## 2023-01-25 NOTE — Plan of Care (Signed)
  Problem: Clinical Measurements: Goal: Ability to maintain clinical measurements within normal limits will improve Outcome: Progressing   Problem: Nutrition: Goal: Adequate nutrition will be maintained Outcome: Progressing   Problem: Pain Management: Goal: General experience of comfort will improve Outcome: Progressing   Problem: Respiratory: Goal: Ability to maintain adequate ventilation will improve Outcome: Progressing

## 2023-01-25 NOTE — Hospital Course (Signed)
 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 She was diagnosed with RLL pneumonia and started on Zithromax  and Rocephin . On 01/17/23 while in the ED, pt had a cardiopulmonary arrest, was resuscitated and achieved ROSC between 6 to 15 minutes post resuscitation. Noted to have possible V-fib reading at some point, with prolonged QTc. Patient was subsequently intubated and was admitted in the ICU for further management. Patient was extubated on 01/21/23. [facility]  hospitalist assumed care 01/22/2023.    In-hospital cardiac arrest Had hypoxia with V-fib prolonged QTc.  Patient was initially intubated 12/31 and extubated 01/21/2023.  Currently on room air.   RLL pneumonia versus aspiration versus postobstructive Right pleural effusion Status postextubation.  CT of the chest 1231 showed moderate right pleural effusion with displaced fracture from CPR with possible worsening pneumonia.  Negative for PE.  Patient has completed doxycycline  and Rocephin .  Continue nebulizers    Acute hypoxic respiratory failure COPD with possible exacerbation Continue oxygen nebulizers as necessary.  Completed course of antibiotic.   Acute systolic and diastolic HF Prolonged Qtc Hyperlipidemia 2D echo  on 12/31 showed EF of 30 to 35%, grade 2 diastolic dysfunction.  Cardiology on board and has been started on Farxiga , Crestor .   New multiple bilateral minimally displaced rib fractures  From CPR.  Continue incentive spirometry pain management.  Normocytic anemia/Anemia of chronic disease/critical illness Baseline hemoglobin around 11.  Latest hemoglobin of 9.9.  Received 1 unit of packed RBC on 01/23/2023. Monitor CBC closely.  History of hypertension On atenolol  at home.  Currently on hold  due to low blood pressure.   History of nonocclusive RIJ thrombus Was on heparin  in the ICU. Note from oncology in August 2023 states she had completed Eliquis  Continue DVT Lovenox  Ppx   Left hip fracture s/p ORIF on 12/16/2022 PT/OT evaluation recommended skilled nursing facility.   Non-small cell lung cancer stage IV On Tagrisso , currently on hold. Outpatient oncology follow-up   Pressure injury Pressure Injury 01/16/23 Heel Left;Posterior Unstageable - Full thickness tissue loss in which the base of the injury is covered by slough (yellow, tan, gray, green or brown) and/or eschar (tan, brown or black) in the wound bed. Blackened skin on bony pr (Active)  01/16/23 1745  Location: Heel  Location Orientation: Left;Posterior  Staging: Unstageable - Full thickness tissue loss in which the base of the injury is covered by slough (yellow, tan, gray, green or brown) and/or eschar (tan, brown or black) in the wound bed.  Wound Description (Comments): Blackened skin on bony prominence of posterior heel  Present on Admission: Yes  Continue wound care.   Nutrition Status: Body mass index is 14.64 kg/m.  Nutrition Problem: Severe Malnutrition Etiology: chronic illness (NSCLC stage IV-b; COPD) Signs/Symptoms: severe fat depletion, severe muscle depletion Interventions: Refer to RD note for recommendations, Tube feeding

## 2023-01-25 NOTE — Progress Notes (Addendum)
 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 She was diagnosed with RLL pneumonia and started on Zithromax  and Rocephin . On 01/17/23 while in the ED, pt had a cardiopulmonary arrest, was resuscitated and achieved ROSC between 6 to 15 minutes post resuscitation. Noted to have possible V-fib reading at some point, with prolonged QTc. Patient was subsequently intubated and was admitted in the ICU for further management. Patient was extubated on 01/21/23. [facility] assumed care 01/22/2023.     Assessment/Plan: Principal Problem:   Sepsis due to pneumonia [facility]) Active Problems:   Malignant neoplasm of lower lobe of right lung (HCC)   Hypokalemia   Cardiac arrest (HCC)   Aspiration pneumonia of right lower lobe (HCC)   Prolonged QT interval   Shock (HCC)   Acute respiratory failure with hypoxia (HCC)   Aspiration pneumonia (HCC)   Acute combined systolic and diastolic heart failure (HCC)   In-hospital cardiac arrest Had hypoxia with V-fib prolonged QTc.  Patient was initially intubated 12/31 and extubated 01/21/2023.  Currently on room air.   RLL pneumonia versus aspiration versus postobstructive Right pleural effusion Severe sepsis Patient met severe sepsis criteria as evidenced by tachycardia tachypnea elevated lactate more than 4 with pneumonia as source of infection.  Currently status postextubation.  CT of the chest 1231 showed moderate right pleural effusion with displaced fracture from CPR with possible worsening pneumonia.  Negative for PE.  Patient has completed doxycycline  and Rocephin .  Continue  nebulizers temperature max of 100.6 F.   Acute hypoxic respiratory failure COPD with possible exacerbation Continue oxygen nebulizers. Completed course of antibiotic.  Cardiology on board and for diuretic today.  Has been started on metoprolol .   Acute systolic and diastolic HF Prolonged Qtc Hyperlipidemia 2D echo  on 12/31 showed EF of 30 to 35%, grade 2 diastolic dysfunction.  Cardiology on board and has been started on Farxiga , Crestor .  Patient will receive 40 mg of IV Lasix  today.  Has been started on metoprolol .  BNP elevated at 501   New multiple bilateral minimally displaced rib fractures  From CPR.  Continue incentive spirometry, pain management.  Normocytic anemia/Anemia of chronic disease/critical illness Baseline hemoglobin around 11.  Latest hemoglobin of 9.9.  Received 1 unit of packed RBC on 01/23/2023. Monitor CBC closely.  History of hypertension On atenolol  at home.  Currently on hold due to low blood pressure.   History of nonocclusive RIJ thrombus Was on heparin  in the ICU. Note from oncology in August 2023 states she had completed Eliquis  Continue DVT Lovenox  Ppx   Left hip fracture s/p ORIF on 12/16/2022 PT/OT evaluation recommended skilled nursing facility.   Non-small cell lung cancer stage IV On Tagrisso , currently on hold. Outpatient oncology follow-up   Pressure injury Pressure Injury 01/16/23 Heel Left;Posterior Unstageable - Full thickness tissue loss in which the base of the injury is covered by slough (yellow, tan, gray, green or brown) and/or eschar (tan, brown or black) in the wound bed. Blackened skin on bony pr (Active)  01/16/23 1745  Location: Heel  Location Orientation: Left;Posterior  Staging: Unstageable - Full thickness tissue loss in which the  base of the injury is covered by slough (yellow, tan, gray, green or brown) and/or eschar (tan, brown or black) in the wound bed.  Wound Description (Comments): Blackened skin on bony prominence of  posterior heel  Present on Admission: Yes  Continue wound care.   Nutrition Status: Body mass index is 14.64 kg/m.  Nutrition Problem: Severe Malnutrition Etiology: chronic illness (NSCLC stage IV-b; COPD) Signs/Symptoms: severe fat depletion, severe muscle depletion Interventions: Refer to RD note for recommendations, Tube feeding     DVT prophylaxis: enoxaparin  (LOVENOX ) injection 30 mg Start: 01/20/23 1445 SCDs Start: 01/16/23 1759   Disposition: Home  Status is: Inpatient Remains inpatient appropriate because: Status post cardiac arrest, pending clinical improvement,    Code Status:     Code Status: Full Code  Family Communication: Spoke with the daughter at bedside.  Consultants: Cardiology PCCM  Procedures: Intubation and mechanical ventilation  Anti-infectives:  None currently  Anti-infectives (From admission, onward)    Start     Dose/Rate Route Frequency Ordered Stop   01/17/23 1000  azithromycin  (ZITHROMAX ) 500 mg in sodium chloride  0.9 % 250 mL IVPB  Status:  Discontinued        500 mg 250 mL/hr over 60 Minutes Intravenous Every 24 hours 01/16/23 1758 01/17/23 0759   01/17/23 1000  cefTRIAXone  (ROCEPHIN ) 2 g in sodium chloride  0.9 % 100 mL IVPB        2 g 200 mL/hr over 30 Minutes Intravenous Every 24 hours 01/16/23 1758 01/21/23 1028   01/17/23 1000  doxycycline  (VIBRAMYCIN ) 100 mg in dextrose  5 % 250 mL IVPB        100 mg 125 mL/hr over 120 Minutes Intravenous Every 12 hours 01/17/23 0800 01/21/23 2337   01/17/23 0230  vancomycin  (VANCOCIN ) 750 mg in sodium chloride  0.9 % 250 mL IVPB  Status:  Discontinued        750 mg 250 mL/hr over 60 Minutes Intravenous  Once 01/17/23 0132 01/17/23 0138   01/17/23 0230  vancomycin  (VANCOCIN ) 750 mg in sodium chloride  0.9 % 250 mL IVPB        750 mg 265 mL/hr over 60 Minutes Intravenous  Once 01/17/23 0137 01/17/23 0337   01/17/23 0145  vancomycin  (VANCOREADY) IVPB 750 mg/150 mL  Status:  Discontinued         750 mg 150 mL/hr over 60 Minutes Intravenous  Once 01/17/23 0047 01/17/23 0132   01/16/23 1130  cefTRIAXone  (ROCEPHIN ) 2 g in sodium chloride  0.9 % 100 mL IVPB        2 g 200 mL/hr over 30 Minutes Intravenous  Once 01/16/23 1118 01/16/23 1211   01/16/23 1130  azithromycin  (ZITHROMAX ) 500 mg in sodium chloride  0.9 % 250 mL IVPB        500 mg 250 mL/hr over 60 Minutes Intravenous  Once 01/16/23 1118 01/16/23 1302        Subjective: Today, patient was seen and examined at bedside.  Patient denies any shortness of breath or cough.  Has some chest discomfort from CPR.  Denies any nausea or vomiting.  Has had bowel movement.  Denies urinary urgency frequency dysuria.  Objective: Vitals:   01/25/23 0752 01/25/23 0800  BP:  130/70  Pulse:  87  Resp:  18  Temp:  99.9 F (37.7 C)  SpO2: 100% 98%    Intake/Output Summary (Last 24 hours) at 01/25/2023 1202 Last data filed at 01/25/2023 0955 Gross per 24 hour  Intake 800 ml  Output 1250 ml  Net -450 ml   Filed Weights   01/21/23 0522 01/22/23 0605 01/25/23 0500  Weight: 43.8 kg 44.9 kg 39.9 kg   Body mass index is 14.64 kg/m.   Physical Exam:  GENERAL: Patient is alert awake and oriented. Not in obvious distress.  Elderly female, Communicative, on nasal cannula oxygen thinly built. HENT: No scleral pallor or icterus. Pupils equally reactive to light. Oral mucosa is moist NECK: is supple, no gross swelling noted. CHEST:  No crackles or wheezes.  Diminished breath sounds bilaterally. CVS: S1 and S2 heard, no murmur. Regular rate and rhythm.  ABDOMEN: Soft, non-tender, bowel sounds are present. EXTREMITIES: No edema.  Thin extremities CNS: Cranial nerves are intact. No focal motor deficits. SKIN: warm and dry without rashes.  Data Review: I have personally reviewed the following laboratory data and studies,  CBC: Recent Labs  Lab 01/21/23 0500 01/21/23 1201 01/22/23 0313 01/23/23 0311 01/24/23 0307 01/25/23 0312  WBC 8.9   --  11.3* 7.8 8.2 10.9*  NEUTROABS  --   --   --  5.6 5.9 8.4*  HGB 6.9* 7.8* 7.5* 7.8* 9.9* 9.9*  HCT 22.7* 25.6* 25.3* 26.7* 30.8* 31.7*  MCV 88.3  --  89.1 90.2 86.8 89.0  PLT 366  --  386 423* 412* 402*   Basic Metabolic Panel: Recent Labs  Lab 01/19/23 0551 01/20/23 0330 01/21/23 0500 01/22/23 0313 01/23/23 0311 01/24/23 0307 01/25/23 0312  NA 130*   < > 136 133* 134* 131* 128*  K 3.2*   < > 4.8 4.7 4.7 4.1 4.1  CL 98   < > 96* 91* 93* 95* 94*  CO2 24   < > 32 32 32 26 24  GLUCOSE 112*   < > 117* 98 102* 111* 116*  BUN 22   < > 31* 25* 18 14 12   CREATININE 0.88   < > 0.60 0.54 0.56 0.58 0.51  CALCIUM  8.0*   < > 8.5* 8.7* 8.6* 8.6* 8.1*  MG 2.3  --   --  1.7 2.1 2.3 2.1   < > = values in this interval not displayed.   Liver Function Tests: Recent Labs  Lab 01/19/23 0551  AST 26  ALT 27  ALKPHOS 126  BILITOT 0.3  PROT 5.7*  ALBUMIN 2.3*   No results for input(s): LIPASE, AMYLASE in the last 168 hours. No results for input(s): AMMONIA in the last 168 hours. Cardiac Enzymes: No results for input(s): CKTOTAL, CKMB, CKMBINDEX, TROPONINI in the last 168 hours. BNP (last 3 results) Recent Labs    01/17/23 0918  BNP 1,272.6*    ProBNP (last 3 results) No results for input(s): PROBNP in the last 8760 hours.  CBG: Recent Labs  Lab 01/24/23 1918 01/24/23 2320 01/25/23 0326 01/25/23 0740 01/25/23 1134  GLUCAP 169* 102* 109* 103* 168*   Recent Results (from the past 240 hours)  Culture, blood (Routine x 2)     Status: None   Collection Time: 01/16/23 10:28 AM   Specimen: BLOOD LEFT ARM  Result Value Ref Range Status   Specimen Description   Final    BLOOD LEFT ARM Performed at [facility], [address] [place], [place] [address]    Special Requests   Final    BOTTLES DRAWN AEROBIC AND ANAEROBIC Blood Culture results may not be optimal due to an inadequate volume of blood received in culture bottles Performed at [facility], [address] [place], [place]  [address]    Culture   Final    NO GROWTH 5 DAYS Performed at [facility], [address], [place], [place] [address]    Report Status 01/21/2023 FINAL  Final  Resp panel by RT-PCR (RSV, Flu A&B, Covid) Anterior Nasal Swab     Status: None   Collection Time: 01/16/23 11:43 AM   Specimen: Anterior Nasal Swab  Result Value Ref Range Status   SARS Coronavirus 2 by RT PCR NEGATIVE NEGATIVE Final    Comment: (NOTE) SARS-CoV-2 target nucleic acids are NOT DETECTED.  The SARS-CoV-2 RNA is generally detectable in upper respiratory specimens during the acute phase of infection. The lowest concentration of SARS-CoV-2 viral copies this assay can detect is 138 copies/mL. A negative result does not preclude SARS-Cov-2 infection and should not be used as the sole basis for treatment or other patient management decisions. A negative result may occur with  improper specimen collection/handling, submission of specimen other than nasopharyngeal swab, presence of viral mutation(s) within the areas targeted by this assay, and inadequate number of viral copies(<138 copies/mL). A negative result must be combined with clinical observations, patient history, and epidemiological information. The expected result is Negative.  Fact Sheet for Patients:  bloggercourse.com  Fact Sheet for Healthcare Providers:  seriousbroker.it  This test is no t yet approved or cleared by the [place]  FDA and  has been authorized for detection and/or diagnosis of SARS-CoV-2 by FDA under an Emergency Use Authorization (EUA). This EUA will remain  in effect (meaning this test can be used) for the duration of the COVID-19 declaration under Section 564(b)(1) of the Act, 21 U.S.C.section 360bbb-3(b)(1), unless the authorization is terminated  or revoked sooner.       Influenza A by PCR NEGATIVE NEGATIVE Final   Influenza  B by PCR NEGATIVE NEGATIVE Final    Comment: (NOTE) The Xpert Xpress SARS-CoV-2/FLU/RSV plus assay is intended as an aid in the diagnosis of influenza from Nasopharyngeal swab specimens and should not be used as a sole basis for treatment. Nasal washings and aspirates are unacceptable for Xpert Xpress SARS-CoV-2/FLU/RSV testing.  Fact Sheet for Patients: bloggercourse.com  Fact Sheet for Healthcare Providers: seriousbroker.it  This test is not yet approved or cleared by the [place]  FDA and has been authorized for detection and/or diagnosis of SARS-CoV-2 by FDA under an Emergency Use Authorization (EUA). This EUA will remain in effect (meaning this test can be used) for the duration of the COVID-19 declaration under Section 564(b)(1) of the Act, 21 U.S.C. section 360bbb-3(b)(1), unless the authorization is terminated or revoked.     Resp Syncytial Virus by PCR NEGATIVE NEGATIVE Final    Comment: (NOTE) Fact Sheet for Patients: bloggercourse.com  Fact Sheet for Healthcare Providers: seriousbroker.it  This test is not yet approved or cleared by the [place]  FDA and has been authorized for detection and/or diagnosis of SARS-CoV-2 by FDA under an Emergency Use Authorization (EUA). This EUA will remain in effect (meaning this test can be used) for the duration of the COVID-19 declaration under Section 564(b)(1) of the Act, 21 U.S.C. section 360bbb-3(b)(1), unless the authorization is terminated or revoked.  Performed at [facility], [address] [place], [place] [address]   MRSA Next Gen by PCR, Nasal     Status: None   Collection Time: 01/17/23  1:21 AM   Specimen: Nasal Mucosa; Nasal Swab  Result Value Ref Range Status   MRSA by PCR Next Gen NOT DETECTED  NOT DETECTED Final    Comment: (NOTE) The GeneXpert MRSA Assay (FDA approved for NASAL specimens  only), is one component of a comprehensive MRSA colonization surveillance program. It is not intended to diagnose MRSA infection nor to guide or monitor treatment for MRSA infections. Test performance is not FDA approved in patients less than 44 years old. Performed at [facility], [address] [place], [place] [address]   Culture, Respiratory w Gram Stain     Status: None   Collection Time: 01/17/23  9:02 AM   Specimen: Tracheal Aspirate; Respiratory  Result Value Ref Range Status   Specimen Description   Final    TRACHEAL ASPIRATE Performed at [facility], [address], [place], [place] [address]    Special Requests   Final    NONE Performed at [facility], [address] [place], [place] [address]    Gram Stain   Final    ABUNDANT WBC PRESENT, PREDOMINANTLY PMN NO ORGANISMS SEEN    Culture   Final    RARE Normal respiratory flora-no Staph aureus or Pseudomonas seen Performed at [facility], [address] [place], [place] [address]    Report Status 01/19/2023 FINAL  Final     Studies: No results found.    Vernal Alstrom, MD  [facility] 01/25/2023  If 7PM-7AM, please contact night-coverage

## 2023-01-26 LAB — CBC
HCT: 33 % — ABNORMAL LOW (ref 36.0–46.0)
Hemoglobin: 10.6 g/dL — ABNORMAL LOW (ref 12.0–15.0)
MCH: 28.3 pg (ref 26.0–34.0)
MCHC: 32.1 g/dL (ref 30.0–36.0)
MCV: 88.2 fL (ref 80.0–100.0)
Platelets: 310 10*3/uL (ref 150–400)
RBC: 3.74 MIL/uL — ABNORMAL LOW (ref 3.87–5.11)
RDW: 16.2 % — ABNORMAL HIGH (ref 11.5–15.5)
WBC: 7.5 10*3/uL (ref 4.0–10.5)
nRBC: 0 % (ref 0.0–0.2)

## 2023-01-26 LAB — GLUCOSE, CAPILLARY
Glucose-Capillary: 103 mg/dL — ABNORMAL HIGH (ref 70–99)
Glucose-Capillary: 104 mg/dL — ABNORMAL HIGH (ref 70–99)
Glucose-Capillary: 128 mg/dL — ABNORMAL HIGH (ref 70–99)
Glucose-Capillary: 146 mg/dL — ABNORMAL HIGH (ref 70–99)
Glucose-Capillary: 202 mg/dL — ABNORMAL HIGH (ref 70–99)
Glucose-Capillary: 74 mg/dL (ref 70–99)

## 2023-01-26 LAB — BASIC METABOLIC PANEL
Anion gap: 11 (ref 5–15)
BUN: 14 mg/dL (ref 8–23)
CO2: 24 mmol/L (ref 22–32)
Calcium: 8.9 mg/dL (ref 8.9–10.3)
Chloride: 97 mmol/L — ABNORMAL LOW (ref 98–111)
Creatinine, Ser: 0.58 mg/dL (ref 0.44–1.00)
GFR, Estimated: >60 mL/min (ref >60)
Glucose, Bld: 98 mg/dL (ref 70–99)
Potassium: 4.3 mmol/L (ref 3.5–5.1)
Sodium: 132 mmol/L — ABNORMAL LOW (ref 135–145)

## 2023-01-26 MED ORDER — ALUM & MAG HYDROXIDE-SIMETH 200-200-20 MG/5ML PO SUSP
30.0000 mL | ORAL | Status: DC | PRN
  Administered 2023-01-26: 30 mL via ORAL
  Filled 2023-01-26: qty 30

## 2023-01-26 MED ORDER — PANTOPRAZOLE SODIUM 40 MG PO TBEC
40.0000 mg | DELAYED_RELEASE_TABLET | Freq: Every day | ORAL | Status: DC
  Administered 2023-01-26 – 2023-01-27 (×2): 40 mg via ORAL
  Filled 2023-01-26 (×2): qty 1

## 2023-01-26 MED ORDER — FUROSEMIDE 40 MG PO TABS
40.0000 mg | ORAL_TABLET | Freq: Every day | ORAL | Status: DC
  Administered 2023-01-26 – 2023-01-27 (×2): 40 mg via ORAL
  Filled 2023-01-26 (×2): qty 1

## 2023-01-26 NOTE — Progress Notes (Addendum)
 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 She was diagnosed with RLL pneumonia and started on Zithromax  and Rocephin . On 01/17/23 while in the ED, pt had a cardiopulmonary arrest, was resuscitated and achieved ROSC between 6 to 15 minutes post resuscitation. Noted to have possible V-fib reading at some point, with prolonged QTc. Patient was subsequently intubated and was admitted in the ICU for further management. Patient was extubated on 01/21/23. [facility] assumed care 01/22/2023.  At this time patient is stable for disposition.    Assessment/Plan: Principal Problem:   Sepsis due to pneumonia [facility]) Active Problems:   Malignant neoplasm of lower lobe of right lung (HCC)   Hypokalemia   Cardiac arrest (HCC)   Aspiration pneumonia of right lower lobe (HCC)   Prolonged QT interval   Shock (HCC)   Acute respiratory failure with hypoxia (HCC)   Aspiration pneumonia (HCC)   Acute combined systolic and diastolic heart failure (HCC)   In-hospital cardiac arrest Had hypoxia with V-fib prolonged QTc.  Patient was initially intubated 12/31 and extubated 01/21/2023.  Currently on room air.   RLL pneumonia versus aspiration versus postobstructive Right pleural effusion Severe sepsis Patient met severe sepsis criteria as evidenced by tachycardia tachypnea elevated lactate more than 4 with pneumonia as source of infection.  Currently status postextubation.  CT of the chest 12/31 showed moderate right pleural effusion with displaced fracture from CPR with possible worsening pneumonia.  Negative for PE.  Patient has  completed doxycycline  and Rocephin .  Continue nebulizers, temperature max of 99.9 F.   Acute hypoxic respiratory failure COPD with possible exacerbation Improved.  Continue oxygen nebulizers. Completed course of antibiotic.  Cardiology on board and was started on metoprolol  yesterday.  Received 1 dose of of IV Lasix .  On room air.    Acute systolic and diastolic HF Prolonged Qtc Hyperlipidemia 2D echo  on 12/31 showed EF of 30 to 35%, grade 2 diastolic dysfunction.  Cardiology on board and on Farxiga , Crestor .  1 dose of IV Lasix  01/25/2023.  Has been started on metoprolol .  BNP elevated at 501   New multiple bilateral minimally displaced rib fractures  From CPR.  Continue incentive spirometry, pain management.  Pain adequately controlled.  Normocytic anemia/Anemia of chronic disease/critical illness Baseline hemoglobin around 11.  Latest hemoglobin of 10.6.  Received 1 unit of packed RBC on 01/23/2023. Appears to be stable.  Mild hyponatremia.  Sodium of 132 today.  Improved from 128.  Will continue to monitor.  Received diuretic.  History of hypertension On atenolol  at home.  Patient has been started on metoprolol  while in the hospital.   History of nonocclusive RIJ thrombus Was on heparin  in the ICU. Note from oncology in August 2023 states she had completed Eliquis  On Lovenox  prophylaxis.   Left hip fracture s/p ORIF on 12/16/2022 PT/OT evaluation recommended skilled nursing facility.   Non-small cell lung cancer stage IV On Tagrisso , currently on hold. Outpatient oncology follow-up   Pressure injury Pressure Injury 01/16/23 Heel Left;Posterior Unstageable - Full thickness tissue loss in which the base of the injury is covered by slough (yellow, tan, gray, green  or brown) and/or eschar (tan, brown or black) in the wound bed. Blackened skin on bony pr (Active)  01/16/23 1745  Location: Heel  Location Orientation: Left;Posterior  Staging: Unstageable - Full thickness tissue loss  in which the base of the injury is covered by slough (yellow, tan, gray, green or brown) and/or eschar (tan, brown or black) in the wound bed.  Wound Description (Comments): Blackened skin on bony prominence of posterior heel  Present on Admission: Yes  Continue wound care.   Nutrition Status: Body mass index is 14.64 kg/m.  Nutrition Problem: Severe Malnutrition Etiology: chronic illness (NSCLC stage IV-b; COPD) Signs/Symptoms: severe fat depletion, severe muscle depletion Interventions: Refer to RD note for recommendations,      DVT prophylaxis: enoxaparin  (LOVENOX ) injection 30 mg Start: 01/20/23 1445 SCDs Start: 01/16/23 1759   Disposition: Skilled nursing facility.  Patient is medically stable for disposition.  Status is: Inpatient Remains inpatient appropriate because: Status post cardiac arrest, awaiting for skilled nursing facility placement.    Code Status:     Code Status: Full Code  Family Communication: Spoke with the daughter on the phone and updated her about the clinical condition on 01/26/2023.  Consultants: Cardiology PCCM  Procedures: Intubation and mechanical ventilation  Anti-infectives:  None currently  Anti-infectives (From admission, onward)    Start     Dose/Rate Route Frequency Ordered Stop   01/17/23 1000  azithromycin  (ZITHROMAX ) 500 mg in sodium chloride  0.9 % 250 mL IVPB  Status:  Discontinued        500 mg 250 mL/hr over 60 Minutes Intravenous Every 24 hours 01/16/23 1758 01/17/23 0759   01/17/23 1000  cefTRIAXone  (ROCEPHIN ) 2 g in sodium chloride  0.9 % 100 mL IVPB        2 g 200 mL/hr over 30 Minutes Intravenous Every 24 hours 01/16/23 1758 01/21/23 1028   01/17/23 1000  doxycycline  (VIBRAMYCIN ) 100 mg in dextrose  5 % 250 mL IVPB        100 mg 125 mL/hr over 120 Minutes Intravenous Every 12 hours 01/17/23 0800 01/21/23 2337   01/17/23 0230  vancomycin  (VANCOCIN ) 750 mg in sodium chloride  0.9 % 250 mL IVPB  Status:  Discontinued         750 mg 250 mL/hr over 60 Minutes Intravenous  Once 01/17/23 0132 01/17/23 0138   01/17/23 0230  vancomycin  (VANCOCIN ) 750 mg in sodium chloride  0.9 % 250 mL IVPB        750 mg 265 mL/hr over 60 Minutes Intravenous  Once 01/17/23 0137 01/17/23 0337   01/17/23 0145  vancomycin  (VANCOREADY) IVPB 750 mg/150 mL  Status:  Discontinued        750 mg 150 mL/hr over 60 Minutes Intravenous  Once 01/17/23 0047 01/17/23 0132   01/16/23 1130  cefTRIAXone  (ROCEPHIN ) 2 g in sodium chloride  0.9 % 100 mL IVPB        2 g 200 mL/hr over 30 Minutes Intravenous  Once 01/16/23 1118 01/16/23 1211   01/16/23 1130  azithromycin  (ZITHROMAX ) 500 mg in sodium chloride  0.9 % 250 mL IVPB        500 mg 250 mL/hr over 60 Minutes Intravenous  Once 01/16/23 1118 01/16/23 1302       Subjective: Today, patient was seen and examined at bedside.  Patient states that she participated with physical therapy.  Complains of mild cough but no dyspnea or shortness of breath.  No fever chills or rigor.  Denies any urinary urgency frequency dysuria.  Objective: Vitals:   01/26/23 0459 01/26/23 0820  BP: 120/72   Pulse: 95   Resp: 19   Temp: 98.6 F (37 C)   SpO2: 97% 98%    Intake/Output Summary (Last 24 hours) at 01/26/2023 1039 Last data filed at 01/26/2023 0900 Gross per 24 hour  Intake 620 ml  Output 350 ml  Net 270 ml   Filed Weights   01/22/23 0605 01/25/23 0500 01/26/23 0500  Weight: 44.9 kg 39.9 kg 40.2 kg   Body mass index is 14.75 kg/m.   Physical Exam:  GENERAL: Patient is alert awake and oriented. Not in obvious distress.  Elderly female, Communicative,  thinly built.  Appears chronically ill HENT: No scleral pallor or icterus. Pupils equally reactive to light. Oral mucosa is moist NECK: is supple, no gross swelling noted. CHEST: Diminished breath sounds bilaterally.  Coarse breath sounds noted. CVS: S1 and S2 heard, no murmur. Regular rate and rhythm.  ABDOMEN: Soft, non-tender, bowel sounds are  present. EXTREMITIES: No edema.  Thin extremities CNS: Cranial nerves are intact. No focal motor deficits. SKIN: warm and dry without rashes.  Data Review: I have personally reviewed the following laboratory data and studies,  CBC: Recent Labs  Lab 01/22/23 0313 01/23/23 0311 01/24/23 0307 01/25/23 0312 01/26/23 0843  WBC 11.3* 7.8 8.2 10.9* 7.5  NEUTROABS  --  5.6 5.9 8.4*  --   HGB 7.5* 7.8* 9.9* 9.9* 10.6*  HCT 25.3* 26.7* 30.8* 31.7* 33.0*  MCV 89.1 90.2 86.8 89.0 88.2  PLT 386 423* 412* 402* 310   Basic Metabolic Panel: Recent Labs  Lab 01/22/23 0313 01/23/23 0311 01/24/23 0307 01/25/23 0312 01/26/23 0843  NA 133* 134* 131* 128* 132*  K 4.7 4.7 4.1 4.1 4.3  CL 91* 93* 95* 94* 97*  CO2 32 32 26 24 24   GLUCOSE 98 102* 111* 116* 98  BUN 25* 18 14 12 14   CREATININE 0.54 0.56 0.58 0.51 0.58  CALCIUM  8.7* 8.6* 8.6* 8.1* 8.9  MG 1.7 2.1 2.3 2.1  --    Liver Function Tests: No results for input(s): AST, ALT, ALKPHOS, BILITOT, PROT, ALBUMIN in the last 168 hours.  No results for input(s): LIPASE, AMYLASE in the last 168 hours. No results for input(s): AMMONIA in the last 168 hours. Cardiac Enzymes: No results for input(s): CKTOTAL, CKMB, CKMBINDEX, TROPONINI in the last 168 hours. BNP (last 3 results) Recent Labs    01/17/23 0918 01/25/23 1021  BNP 1,272.6* 501.2*    ProBNP (last 3 results) No results for input(s): PROBNP in the last 8760 hours.  CBG: Recent Labs  Lab 01/25/23 1601 01/25/23 2007 01/26/23 0002 01/26/23 0400 01/26/23 0806  GLUCAP 87 97 128* 104* 103*   Recent Results (from the past 240 hours)  Resp panel by RT-PCR (RSV, Flu A&B, Covid) Anterior Nasal Swab     Status: None   Collection Time: 01/16/23 11:43 AM   Specimen: Anterior Nasal Swab  Result Value Ref Range Status   SARS Coronavirus 2 by RT PCR NEGATIVE NEGATIVE Final    Comment: (NOTE) SARS-CoV-2 target nucleic acids are NOT DETECTED.  The  SARS-CoV-2 RNA is generally detectable in upper respiratory specimens during the acute phase of infection. The lowest concentration of SARS-CoV-2 viral copies this assay can detect is 138 copies/mL. A negative result does not preclude SARS-Cov-2 infection and should not be used as the sole basis for treatment or other patient management decisions. A negative result may occur with  improper specimen collection/handling,  submission of specimen other than nasopharyngeal swab, presence of viral mutation(s) within the areas targeted by this assay, and inadequate number of viral copies(<138 copies/mL). A negative result must be combined with clinical observations, patient history, and epidemiological information. The expected result is Negative.  Fact Sheet for Patients:  bloggercourse.com  Fact Sheet for Healthcare Providers:  seriousbroker.it  This test is no t yet approved or cleared by the [place]  FDA and  has been authorized for detection and/or diagnosis of SARS-CoV-2 by FDA under an Emergency Use Authorization (EUA). This EUA will remain  in effect (meaning this test can be used) for the duration of the COVID-19 declaration under Section 564(b)(1) of the Act, 21 U.S.C.section 360bbb-3(b)(1), unless the authorization is terminated  or revoked sooner.       Influenza A by PCR NEGATIVE NEGATIVE Final   Influenza B by PCR NEGATIVE NEGATIVE Final    Comment: (NOTE) The Xpert Xpress SARS-CoV-2/FLU/RSV plus assay is intended as an aid in the diagnosis of influenza from Nasopharyngeal swab specimens and should not be used as a sole basis for treatment. Nasal washings and aspirates are unacceptable for Xpert Xpress SARS-CoV-2/FLU/RSV testing.  Fact Sheet for Patients: bloggercourse.com  Fact Sheet for Healthcare Providers: seriousbroker.it  This test is not yet approved or  cleared by the [place]  FDA and has been authorized for detection and/or diagnosis of SARS-CoV-2 by FDA under an Emergency Use Authorization (EUA). This EUA will remain in effect (meaning this test can be used) for the duration of the COVID-19 declaration under Section 564(b)(1) of the Act, 21 U.S.C. section 360bbb-3(b)(1), unless the authorization is terminated or revoked.     Resp Syncytial Virus by PCR NEGATIVE NEGATIVE Final    Comment: (NOTE) Fact Sheet for Patients: bloggercourse.com  Fact Sheet for Healthcare Providers: seriousbroker.it  This test is not yet approved or cleared by the [place]  FDA and has been authorized for detection and/or diagnosis of SARS-CoV-2 by FDA under an Emergency Use Authorization (EUA). This EUA will remain in effect (meaning this test can be used) for the duration of the COVID-19 declaration under Section 564(b)(1) of the Act, 21 U.S.C. section 360bbb-3(b)(1), unless the authorization is terminated or revoked.  Performed at [facility], [address] [place], [place] [address]   MRSA Next Gen by PCR, Nasal     Status: None   Collection Time: 01/17/23  1:21 AM   Specimen: Nasal Mucosa; Nasal Swab  Result Value Ref Range Status   MRSA by PCR Next Gen NOT DETECTED NOT DETECTED Final    Comment: (NOTE) The GeneXpert MRSA Assay (FDA approved for NASAL specimens only), is one component of a comprehensive MRSA colonization surveillance program. It is not intended to diagnose MRSA infection nor to guide or monitor treatment for MRSA infections. Test performance is not FDA approved in patients less than 53 years old. Performed at [facility], [address], [place], [place] [address]   Culture, Respiratory w Gram Stain     Status: None   Collection Time: 01/17/23  9:02 AM   Specimen: Tracheal Aspirate; Respiratory  Result Value Ref Range Status    Specimen Description   Final    TRACHEAL ASPIRATE Performed at [facility], [address] [place], [place] [address]    Special Requests   Final    NONE Performed at [facility], [address], [place], [place] [address]    Gram Stain  Final    ABUNDANT WBC PRESENT, PREDOMINANTLY PMN NO ORGANISMS SEEN    Culture   Final    RARE Normal respiratory flora-no Staph aureus or Pseudomonas seen Performed at [facility], [address] [place], [place] [address]    Report Status 01/19/2023 FINAL  Final     Studies: DG CHEST PORT 1 VIEW Result Date: 01/25/2023 CLINICAL DATA:  Shortness of breath. EXAM: PORTABLE CHEST 1 VIEW COMPARISON:  Chest radiograph dated 01/21/2023. FINDINGS: Similar appearance of right pleural effusion and right mid to lower lung field opacity. Trace left pleural effusion. No pneumothorax. Stable cardiac silhouette. No acute osseous pathology. IMPRESSION: Similar appearance of right pleural effusion and right mid to lower lung field opacity. Electronically Signed   By: Vanetta Chou M.D.   On: 01/25/2023 14:20      Vernal Alstrom, MD  [facility] 01/26/2023  If 7PM-7AM, please contact night-coverage

## 2023-01-26 NOTE — Progress Notes (Signed)
 Physical Therapy Treatment Patient Details Name: Anna Finley MRN: [id number] DOB: October 11, 1945 Today's Date: 01/26/2023   History of Present Illness Patient is a 78 year old female who was admitted on 12/30 with cough and worsening SOB. She was dx with RLL PNA . On 12/31 while in the ED, pt had a cardiopulmonary arrest, was resuscitated and possibly achieved ROSC between 6 to 15 minutes post resuscitation. Noted to have possible V-fib reading at some point, with prolonged QTc. Patient was subsequently intubated and was admitted in the ICU for further management. Patient was extubated on 01/21/23.   PMH: NSCLC, COPD, right IJ thrombus, HTN, anemia, left hip fracture status post ORIF 12/16/2022, and severe PCM    PT Comments  Pt seen for PT tx with pt agreeable. Pt is willing to participate and able to progress to gait attempts on this date. Pt requires mod assist for transfers, max assist for gait attempts with close chair follow. Pt presents with significant generalized weakness. Pt would benefit from ongoing skilled PT treatment to address strengthening, balance, gait & endurance.    If plan is discharge home, recommend the following: A lot of help with walking and/or transfers;A lot of help with bathing/dressing/bathroom;Help with stairs or ramp for entrance;Assist for transportation;Assistance with cooking/housework;Supervision due to cognitive status   Can travel by private vehicle     No  Equipment Recommendations  None recommended by PT (defer to next venue)    Recommendations for Other Services       Precautions / Restrictions Precautions Precautions: Fall Restrictions Weight Bearing Restrictions Per Provider Order: No     Mobility  Bed Mobility Overal bed mobility: Needs Assistance Bed Mobility: Supine to Sit     Supine to sit: Supervision, Used rails, HOB elevated          Transfers Overall transfer level: Needs assistance Equipment used: Rolling walker (2  wheels) Transfers: Sit to/from Stand, Bed to chair/wheelchair/BSC Sit to Stand: Mod assist   Step pivot transfers: Mod assist       General transfer comment: cuing & extra time to scoot out to edge of seat to increase ease of transfer    Ambulation/Gait Ambulation/Gait assistance: Max assist Gait Distance (Feet): 3 Feet (+ 4 ft) Assistive device: Rolling walker (2 wheels) Gait Pattern/deviations: Decreased step length - right, Decreased step length - left, Decreased stride length, Decreased dorsiflexion - left, Trunk flexed Gait velocity: decreased     General Gait Details: inconsistent step width, decreased weight shifting L<>R, trunk flexed, cuing to increase glute/hip extensor activation for more upright posture but pt with difficulty return demonstrating, requires frequent seated rest breaks 2/2 fatigue. Of note, pt notes she walks ~25 ft at baseline with rollator before requiring seated rest.   Stairs             Wheelchair Mobility     Tilt Bed    Modified Rankin (Stroke Patients Only)       Balance Overall balance assessment: History of Falls, Needs assistance Sitting-balance support: Bilateral upper extremity supported, Feet supported Sitting balance-Leahy Scale: Fair Sitting balance - Comments: supervision static sitting   Standing balance support: Bilateral upper extremity supported, During functional activity, Reliant on assistive device for balance Standing balance-Leahy Scale: Poor                              Cognition Arousal: Alert Behavior During Therapy: WFL for tasks assessed/performed Overall Cognitive Status: No family/caregiver present  to determine baseline cognitive functioning                         Following Commands: Follows one step commands consistently, Follows one step commands with increased time                Exercises Other Exercises Other Exercises: reviewed use of incentive spirometer with  pt    General Comments General comments (skin integrity, edema, etc.): Pt c/o dizziness at beginning of session, BP in LUE when sitting in recliner: 128/70 mmHg MAP 88, HR 99, HR 105-115 bpm during session. Pt with c/o nausea at end of session.      Pertinent Vitals/Pain Pain Assessment Pain Assessment: Faces Faces Pain Scale: Hurts even more Pain Location: chest soreness (likely 2/2 compressions) Pain Descriptors / Indicators: Sore, Grimacing, Guarding, Burning Pain Intervention(s): Monitored during session, Limited activity within patient's tolerance    Home Living                          Prior Function            PT Goals (current goals can now be found in the care plan section) Acute Rehab PT Goals PT Goal Formulation: With patient Time For Goal Achievement: 02/05/23 Potential to Achieve Goals: Fair Progress towards PT goals: Progressing toward goals    Frequency    Min 1X/week      PT Plan      Co-evaluation              AM-PAC PT 6 Clicks Mobility   Outcome Measure  Help needed turning from your back to your side while in a flat bed without using bedrails?: A Little Help needed moving from lying on your back to sitting on the side of a flat bed without using bedrails?: A Little Help needed moving to and from a bed to a chair (including a wheelchair)?: A Lot Help needed standing up from a chair using your arms (e.g., wheelchair or bedside chair)?: A Lot Help needed to walk in hospital room?: Total Help needed climbing 3-5 steps with a railing? : Total 6 Click Score: 12    End of Session   Activity Tolerance: Patient limited by fatigue (c/o nausea by end of session) Patient left: in chair;with chair alarm set;with call bell/phone within reach Nurse Communication: Mobility status PT Visit Diagnosis: Other abnormalities of gait and mobility (R26.89);Difficulty in walking, not elsewhere classified (R26.2);Muscle weakness (generalized)  (M62.81);Unsteadiness on feet (R26.81)     Time: 9061-8998 PT Time Calculation (min) (ACUTE ONLY): 23 min  Charges:    $Therapeutic Activity: 23-37 mins PT General Charges $$ ACUTE PT VISIT: 1 Visit                     Richerd Pinal, PT, DPT 01/26/23, 10:14 AM    Richerd CHRISTELLA Pinal 01/26/2023, 10:13 AM

## 2023-01-26 NOTE — Progress Notes (Signed)
 Patient Name: Anna Finley Date of Encounter: 01/26/2023 [facility] Cardiologist: Annabella Scarce, MD   Interval Summary  .    Feeling well.  Denies chest pain or shortness of breath. Not eager to go to SNF.   Vital Signs .    Vitals:   01/26/23 0459 01/26/23 0500 01/26/23 0820 01/26/23 1220  BP: 120/72   100/70  Pulse: 95   94  Resp: 19   17  Temp: 98.6 F (37 C)   98.7 F (37.1 C)  TempSrc: Oral   Oral  SpO2: 97%  98% 98%  Weight:  40.2 kg    Height:        Intake/Output Summary (Last 24 hours) at 01/26/2023 1426 Last data filed at 01/26/2023 1219 Gross per 24 hour  Intake 540 ml  Output 50 ml  Net 490 ml      01/26/2023    5:00 AM 01/25/2023    5:00 AM 01/22/2023    6:05 AM  Last 3 Weights  Weight (lbs) 88 lb 10 oz 87 lb 15.4 oz 98 lb 15.8 oz  Weight (kg) 40.2 kg 39.9 kg 44.9 kg      Telemetry/ECG    Sinus rhythm.  PVCs.  7 beats NSVT.  - Personally Reviewed  Physical Exam .    VS:  BP 100/70 (BP Location: Left Arm)   Pulse 94   Temp 98.7 F (37.1 C) (Oral)   Resp 17   Ht 5' 5 (1.651 m)   Wt 40.2 kg   SpO2 98%   BMI 14.75 kg/m  , BMI Body mass index is 14.75 kg/m. GENERAL:  Frail.  No acute distress.  HEENT: Pupils equal round and reactive, fundi not visualized, oral mucosa unremarkable NECK:  No jugular venous distention, waveform within normal limits, carotid upstroke brisk and symmetric, no bruits, no thyromegaly LUNGS: CTAB HEART:  RRR.  PMI not displaced or sustained,S1 and S2 within normal limits, no S3, no S4, no clicks, no rubs, no murmurs ABD:  Flat, positive bowel sounds normal in frequency in pitch, no bruits, no rebound, no guarding, no midline pulsatile mass, no hepatomegaly, no splenomegaly EXT:  2 plus pulses throughout, no edema, no cyanosis no clubbing SKIN:  No rashes no nodules NEURO:  Cranial nerves II through XII grossly intact, motor grossly intact throughout PSYCH:  Cognitively intact, oriented to person place and  time   Assessment & Plan .     Ms. Wainwright is a 45F with NSCLC stage IV, prior tobacco abuse, COPD, hypertension, right IJ thrombus, and anemia of chronic disease admitted with cough and shortness of breath x 3 days.  She was diagnosed with right lower lobe pneumonia.  In the ED she had a cardiopulmonary arrest.  She was found to have a shockable rhythm and underwent CPR for an unclear duration of time.     # Acute systolic and diastolic HF:  # CAD:  # Acute on chronic anemia: Post extubation echo revealed LVEF 30-35%.  At home she reports that at baseline she ambulates with a walker.  She has not had any chest pain or pressure.  Her only chest pain now is with inspiration consistent with CPR.  On my review of her chest CT she does have some coronary calcifications.  Typically would recommend cardiac catheterization to assess for obstructive coronary disease.  However, she is a very good candidate for this.  I'm also concerned about stent placement when her hemoglobin has been as  low as 6.9 this admission.  It was 11.3 on 10/2022.  Would recommend trying to keep her hemoglobin above 8 given that she has some underlying coronary disease and heart failure as well.  Hgb increased to 9.9 from 7.8 after transfusion of 1u pRBC and has been stable.  It seems that her primary event was respiratory and not cardiac.  Avoid QT prolonging medication and maintain K>4, Mg >2.  BP improved.  Added metoprolol  succinate 25mg  daily.  Started Farxiga  10mg  daily.  Breathing improved after a dose of IV lasix .  Will start lasix  40mg  po daily. Repeat echo in 3 months.  Will   # NSVT:  Asymptomatic.  Add metoprolol  succinate 25mg  daily.   # Hyperlipidemia:  LDL 152 this admission.  Recommend rosuvastatin  20mg .  Check fasting lipids/CMP in 2-3 months.   # Acute on chronic anemia: Normocytic anemia.  Hgb increased to 9.9 from 7.8 after transfusion of 1u pRBC.  Aspirin  was reduced from 325mg  to 81mg .  She completed her  course of Eliquis  for internal jugular thrombus.   # CAP: Management per primary team.   [facility] will sign off.   Medication Recommendations:  start lasix  40mg  po daily Other recommendations (labs, testing, etc):  BMP in 1 week.  Echo in 3 months.  Follow up as an outpatient:  scheduled.    For questions or updates, please contact [facility] Please consult [email or web address] for contact info under        Signed, Annabella Scarce, MD

## 2023-01-26 NOTE — TOC Progression Note (Addendum)
 Transition of Care [facility]) - Progression Note    Patient Details  Name: Anna Finley MRN: [id number] Date of Birth: March 17, 1945  [facility] CM/SW Contact  Tawni CHRISTELLA Eva, LCSW Phone Number: 01/26/2023, 1:16 PM  Clinical Narrative:    CSW spoke with pt's daughter , she has chosen [employer] [employer] . CSW to start insurance auth. TOC to follow.    Adden  2:30pm Auth pending. TOC to follow.  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  Expected Discharge Plan and Services In-house Referral: Clinical Social Work   Post Acute Care Choice: Skilled Nursing Facility Living arrangements for the past 2 months: Single Family Home                 DME Arranged: N/A DME Agency: NA                   Social Determinants of Health (SDOH) Interventions SDOH Screenings   Food Insecurity: No Food Insecurity (01/16/2023)  Housing: Low Risk  (01/17/2023)  Transportation Needs: No Transportation Needs (01/17/2023)  Utilities: Not At Risk (01/17/2023)  Social Connections: Moderately Integrated (01/17/2023)  Tobacco Use: Medium Risk (01/16/2023)    Readmission Risk Interventions     No data to display

## 2023-01-27 LAB — GLUCOSE, CAPILLARY
Glucose-Capillary: 130 mg/dL — ABNORMAL HIGH (ref 70–99)
Glucose-Capillary: 143 mg/dL — ABNORMAL HIGH (ref 70–99)
Glucose-Capillary: 93 mg/dL (ref 70–99)
Glucose-Capillary: 99 mg/dL (ref 70–99)

## 2023-01-27 MED ORDER — PANTOPRAZOLE SODIUM 40 MG PO TBEC: 40.0000 mg | DELAYED_RELEASE_TABLET | Freq: Every day | ORAL | Status: DC

## 2023-01-27 MED ORDER — ALUM & MAG HYDROXIDE-SIMETH 200-200-20 MG/5ML PO SUSP: 30.0000 mL | ORAL | Status: DC | PRN

## 2023-01-27 MED ORDER — IPRATROPIUM-ALBUTEROL 0.5-2.5 (3) MG/3ML IN SOLN: 3.0000 mL | Freq: Four times a day (QID) | RESPIRATORY_TRACT | Status: DC | PRN

## 2023-01-27 MED ORDER — ARFORMOTEROL TARTRATE 15 MCG/2ML IN NEBU: 15.0000 ug | INHALATION_SOLUTION | Freq: Two times a day (BID) | RESPIRATORY_TRACT | Status: DC

## 2023-01-27 MED ORDER — BUDESONIDE 0.5 MG/2ML IN SUSP: 0.5000 mg | Freq: Two times a day (BID) | RESPIRATORY_TRACT | Status: AC

## 2023-01-27 MED ORDER — FUROSEMIDE 40 MG PO TABS: 40.0000 mg | ORAL_TABLET | Freq: Every day | ORAL | Status: DC

## 2023-01-27 MED ORDER — REVEFENACIN 175 MCG/3ML IN SOLN: 175.0000 ug | Freq: Every day | RESPIRATORY_TRACT | Status: DC

## 2023-01-27 MED ORDER — ROSUVASTATIN CALCIUM 20 MG PO TABS: 20.0000 mg | ORAL_TABLET | Freq: Every day | ORAL | Status: DC

## 2023-01-27 MED ORDER — ASPIRIN 81 MG PO TBEC: 81.0000 mg | DELAYED_RELEASE_TABLET | Freq: Every day | ORAL | Status: DC

## 2023-01-27 MED ORDER — METOPROLOL SUCCINATE ER 25 MG PO TB24: 25.0000 mg | ORAL_TABLET | Freq: Every day | ORAL | Status: DC

## 2023-01-27 MED ORDER — ADULT MULTIVITAMIN W/MINERALS CH: 1.0000 | ORAL_TABLET | Freq: Every day | ORAL | Status: DC

## 2023-01-27 MED ORDER — LIDOCAINE 5 % EX PTCH: 1.0000 | MEDICATED_PATCH | CUTANEOUS | 0 refills | Status: DC

## 2023-01-27 MED ORDER — POLYETHYLENE GLYCOL 3350 17 G PO PACK: 17.0000 g | PACK | Freq: Every day | ORAL | Status: DC | PRN

## 2023-01-27 MED ORDER — OXYCODONE HCL 5 MG PO TABS: 5.0000 mg | ORAL_TABLET | Freq: Four times a day (QID) | ORAL | 0 refills | Status: AC | PRN

## 2023-01-27 MED ORDER — DAPAGLIFLOZIN PROPANEDIOL 10 MG PO TABS: 10.0000 mg | ORAL_TABLET | Freq: Every day | ORAL | Status: DC

## 2023-01-27 NOTE — Progress Notes (Signed)
 4 pocket knives found. 1 found open in the bed and 3 from her purse. Security contacted and items now in their possession. 1 fork from home (pointy) kept in pt's chart. Home medications sent to pharmacy.

## 2023-01-27 NOTE — Discharge Summary (Addendum)
 Physician Discharge Summary  Anna Finley FMW:[id number] DOB: 20-Oct-1945 DOA: 01/16/2023  PCP: [name], MD  Admit date: 01/16/2023 Discharge date: 01/27/2023  Admitted From: Home  Discharge disposition: SNF   Recommendations for Outpatient Follow-Up:   Follow up with your primary care provider at the skilled nursing facility in 3 to 5 days. Check CBC, BMP, magnesium  in the next visit Follow-up with cardiology as scheduled by the clinic.  Plan is to do 2D echocardiogram in 3 weeks. Follow-up with oncology as outpatient for ongoing treatment of the lung cancer.  Tagrisso  on hold until seen by oncologist.   Discharge Diagnosis:   Principal Problem:   Sepsis due to pneumonia [facility]) Active Problems:   Malignant neoplasm of lower lobe of right lung (HCC)   Hypokalemia   Cardiac arrest (HCC)   Aspiration pneumonia of right lower lobe (HCC)   Prolonged QT interval   Shock (HCC)   Acute respiratory failure with hypoxia (HCC)   Aspiration pneumonia (HCC)   Acute combined systolic and diastolic heart failure (HCC)  Discharge Condition: Improved.  Diet recommendation: Low sodium, heart healthy.  Mechanical soft diet.  Wound care: None.  Code status: Full.   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 She was diagnosed with RLL pneumonia and started on Zithromax  and Rocephin . On 01/17/23 while in the ED, pt had a cardiopulmonary arrest, was resuscitated and achieved ROSC between 6 to 15 minutes post resuscitation. Noted to have possible V-fib reading at some point, with prolonged QTc. Patient was subsequently intubated and was admitted in the ICU for further management. Patient was extubated on 01/21/23  and was transferred to the floor for further care.  Hospital Course:   Following conditions were addressed during hospitalization as listed below,  In-hospital cardiac arrest Had hypoxia with V-fib prolonged QTc.  Patient was initially intubated 12/31 and extubated 01/21/2023.  Currently on room air.  No active issues.  Has been started on metoprolol  by cardiology.   RLL pneumonia versus aspiration versus postobstructive Right pleural effusion Severe sepsis and septic shock Patient met severe sepsis criteria as evidenced by tachycardia tachypnea elevated lactate more than 4 with pneumonia as source of infection.  Currently status postextubation.  CT of the chest 12/31 showed moderate right pleural effusion with displaced fracture from CPR with possible worsening pneumonia.  Negative for PE.  Patient has completed doxycycline  and Rocephin .  Continue nebulizers, on discharge.  Patient will continue Lasix  daily on discharge as well.   Acute hypoxic respiratory failure COPD with possible exacerbation Improved. Completed course of antibiotic.  Will be on Lasix  on discharge.  Will continue nebulizer on discharge.    Acute systolic and diastolic HF Prolonged Qtc Hyperlipidemia 2D echo  on 12/31 showed EF of 30 to 35%, grade 2 diastolic dysfunction.  Cardiology on board and on Farxiga , Crestor , Lasix  metoprolol .  Will continue on discharge.  Cardiology to follow-up in the clinic.  Recommendation is for BMP in 1 week in echocardiogram in 3 months.   New multiple bilateral minimally displaced rib fractures  From CPR.  Continue incentive spirometry, pain management.  Pain adequately controlled.  Continue oxycodone  for few more days if needed.   Normocytic anemia/Anemia of chronic disease/critical illness Baseline  hemoglobin around 11.  Latest hemoglobin of 10.6.  Received 1 unit of packed RBC on 01/23/2023. Appears to be stable.   Mild hyponatremia.  Latest sodium of 132.  Improved from 128.  Check  BMP in 3 to 5 days.   History of hypertension On atenolol  at home.  Patient has been started on metoprolol  while in the hospital.  Will continue metoprolol  on discharge.   History of nonocclusive RIJ thrombus Was on heparin  in the ICU. Note from oncology in August 2023 states she had completed Eliquis .  No need for further anticoagulation.  Patient will be on aspirin  on discharge.   Left hip fracture s/p ORIF on 12/16/2022 PT/OT evaluation recommended skilled nursing facility.   Non-small cell lung cancer stage IV On Tagrisso , currently on hold.  Hold until outpatient oncology follow-up   Pressure injury Pressure Injury 01/16/23 Heel Left;Posterior Unstageable - Full thickness tissue loss in which the base of the injury is covered by slough (yellow, tan, gray, green or brown) and/or eschar (tan, brown or black) in the wound bed. Blackened skin on bony pr (Active)  01/16/23 1745  Location: Heel  Location Orientation: Left;Posterior  Staging: Unstageable - Full thickness tissue loss in which the base of the injury is covered by slough (yellow, tan, gray, green or brown) and/or eschar (tan, brown or black) in the wound bed.  Wound Description (Comments): Blackened skin on bony prominence of posterior heel  Present on Admission: Yes  Continue wound care.   Nutrition Status: Body mass index is 14.64 kg/m.  Nutrition Problem: Severe Malnutrition Etiology: chronic illness (NSCLC stage IV-b; COPD) Signs/Symptoms: severe fat depletion, severe muscle depletion Interventions: Patient was on boost to 1 continued 3 times a day with meals during hospitalization this could be continued on discharge.    Disposition.  At this time, patient is stable for disposition to skilled nursing facility with outpatient cardiology and oncology follow-up.  Medical Consultants:   Cardiology PCCM  Procedures:    Intubation and mechanical ventilation  Subjective:   Today, patient was seen and examined  at bedside.  Feels better.  Patient's daughter at bedside.  Denies any nausea vomiting fever chills or rigor.  Has mild cough but no dyspnea.  Discharge Exam:   Vitals:   01/27/23 0812 01/27/23 0951  BP:  127/64  Pulse:  92  Resp:    Temp:    SpO2: 94%    Vitals:   01/27/23 0403 01/27/23 0728 01/27/23 0812 01/27/23 0951  BP: 110/65   127/64  Pulse: 95   92  Resp: 18     Temp: 98.3 F (36.8 C)     TempSrc: Oral     SpO2: 94%  94%   Weight:  40 kg    Height:       Body mass index is 14.67 kg/m.  General: Alert awake, not in obvious distress, thinly built. HENT: pupils equally reacting to light,  No scleral pallor or icterus noted. Oral mucosa is moist.  Chest:    Diminished breath sounds bilaterally.  Coarse breath sounds noted bilaterally. CVS: S1 &S2 heard. No murmur.  Regular rate and rhythm. Abdomen: Soft, nontender, nondistended.  Bowel sounds are heard.   Extremities: No cyanosis, clubbing or edema.  Peripheral pulses are palpable.  Thin extremities. Psych: Alert, awake and oriented, normal mood CNS:  No cranial nerve deficits, generalized weakness noted, Skin: Warm and dry.  No rashes noted.  The results of significant diagnostics from this hospitalization (including imaging,  microbiology, ancillary and laboratory) are listed below for reference.     Diagnostic Studies:   CT Angio Chest Pulmonary Embolism (PE) W or WO Contrast Result Date: 01/17/2023 CLINICAL DATA:  History of right internal jugular thrombus. History of right-sided lung cancer. Cardiac arrest. EXAM: CT ANGIOGRAPHY CHEST WITH CONTRAST TECHNIQUE: Multidetector CT imaging of the chest was performed using the standard protocol during bolus administration of intravenous contrast. Multiplanar CT image reconstructions and MIPs were obtained to evaluate the vascular anatomy. RADIATION DOSE REDUCTION: This exam was performed according to the departmental dose-optimization program which includes automated  exposure control, adjustment of the mA and/or kV according to patient size and/or use of iterative reconstruction technique. CONTRAST:  75mL OMNIPAQUE  IOHEXOL  350 MG/ML SOLN COMPARISON:  December 21, 2022. FINDINGS: Cardiovascular: Satisfactory opacification of the pulmonary arteries to the segmental level. No evidence of pulmonary embolism. Normal heart size. No pericardial effusion. Mediastinum/Nodes: Endotracheal tube is in grossly good position. Nasogastric tube is seen passing through esophagus and into stomach. Small sliding-type hiatal hernia is noted. No definite adenopathy is noted. Thyroid  gland is unremarkable. Lungs/Pleura: No pneumothorax is noted. Emphysematous disease is noted. Small left pleural effusion is noted with minimal adjacent subsegmental atelectasis. Moderate size right pleural effusion is noted with adjacent subsegmental atelectasis of the right upper and lower lobes. Significantly increased right perihilar opacity is noted suggesting worsening pneumonia or atelectasis or possibly worsening malignancy. Right lower lobe nodule noted on prior exam is not well visualized as it is surrounded by this opacity. Upper Abdomen: No acute abnormality. Musculoskeletal: Multiple bilateral minimally displaced rib fractures are noted laterally. Severe degenerative changes seen involving the right glenohumeral joint. Stable old sclerotic T3 compression fracture is noted which may be pathologic. Review of the MIP images confirms the above findings. IMPRESSION: No definite evidence of pulmonary embolus. Interval development of multiple bilateral minimally displaced rib fractures. Moderate size right pleural effusion is now noted with adjacent subsegmental atelectasis of the right upper and lower lobes. Significantly increased right perihilar opacity is noted suggesting worsening pneumonia or atelectasis or possibly worsening malignancy. The right lower lobe nodule noted on prior exam is not well visualized  currently due to this opacity. Small left pleural effusion is noted with minimal adjacent subsegmental atelectasis. Endotracheal and nasogastric tubes are in grossly good position. Aortic Atherosclerosis (ICD10-I70.0) and Emphysema (ICD10-J43.9). Electronically Signed   By: Lynwood Landy Raddle M.D.   On: 01/17/2023 15:38   ECHOCARDIOGRAM COMPLETE Result Date: 01/17/2023    ECHOCARDIOGRAM REPORT   Patient Name:   Anna Finley Date of Exam: 01/17/2023 Medical Rec #:  [id number]      Height:       65.0 in Accession #:    [id number]     Weight:       88.0 lb Date of Birth:  1945/11/22       BSA:          1.395 m Patient Age:    77 years       BP:           100/58 mmHg Patient Gender: F              HR:           89 bpm. Exam Location:  Inpatient Procedure: 2D Echo, Cardiac Doppler, Color Doppler and Intracardiac            Opacification Agent Indications:    CHF- Acute Diastolic  History:        Patient  has no prior history of Echocardiogram examinations.                 Risk Factors:Former Smoker.  Sonographer:    Ozell Free Referring Phys: [id number] OMAR CHRISTELLA PLY  Sonographer Comments: Technically challenging study due to limited acoustic windows and echo performed with patient supine and on artificial respirator. Image acquisition challenging due to patient body habitus. IMPRESSIONS  1. Technically difficult study with limited visualization of cardiac structures.  2. Left ventricular ejection fraction, by estimation, is 30 to 35%. The left ventricle has moderately decreased function. The left ventricle demonstrates global hypokinesis. Left ventricular diastolic parameters are consistent with Grade II diastolic dysfunction (pseudonormalization).  3. Right ventricular systolic function is mildly reduced. The right ventricular size is normal. There is normal pulmonary artery systolic pressure.  4. Left atrial size was moderately dilated.  5. The mitral valve is normal in structure. Trivial mitral valve regurgitation.  No evidence of mitral stenosis.  6. The aortic valve is grossly normal. Aortic valve regurgitation is not visualized. No aortic stenosis is present.  7. The inferior vena cava is normal in size with greater than 50% respiratory variability, suggesting right atrial pressure of 3 mmHg. FINDINGS  Left Ventricle: Left ventricular ejection fraction, by estimation, is 30 to 35%. The left ventricle has moderately decreased function. The left ventricle demonstrates global hypokinesis. Definity  contrast agent was given IV to delineate the left ventricular endocardial borders. The left ventricular internal cavity size was normal in size. There is no left ventricular hypertrophy. Left ventricular diastolic parameters are consistent with Grade II diastolic dysfunction (pseudonormalization). Right Ventricle: The right ventricular size is normal. No increase in right ventricular wall thickness. Right ventricular systolic function is mildly reduced. There is normal pulmonary artery systolic pressure. The tricuspid regurgitant velocity is 2.46 m/s, and with an assumed right atrial pressure of 3 mmHg, the estimated right ventricular systolic pressure is 27.2 mmHg. Left Atrium: Left atrial size was moderately dilated. Right Atrium: Right atrial size was normal in size. Pericardium: There is no evidence of pericardial effusion. Mitral Valve: The mitral valve is normal in structure. Trivial mitral valve regurgitation. No evidence of mitral valve stenosis. MV peak gradient, 8.2 mmHg. The mean mitral valve gradient is 3.0 mmHg. Tricuspid Valve: The tricuspid valve is not well visualized. Tricuspid valve regurgitation is not demonstrated. No evidence of tricuspid stenosis. Aortic Valve: The aortic valve is grossly normal. Aortic valve regurgitation is not visualized. No aortic stenosis is present. Aortic valve mean gradient measures 3.0 mmHg. Aortic valve peak gradient measures 5.0 mmHg. Aortic valve area, by VTI measures 1.67 cm.  Pulmonic Valve: The pulmonic valve was not well visualized. Pulmonic valve regurgitation is not visualized. No evidence of pulmonic stenosis. Aorta: The aortic root is normal in size and structure. Venous: The inferior vena cava is normal in size with greater than 50% respiratory variability, suggesting right atrial pressure of 3 mmHg. IAS/Shunts: No atrial level shunt detected by color flow Doppler.  LEFT VENTRICLE PLAX 2D LVIDd:         4.60 cm      Diastology LVIDs:         3.80 cm      LV e' medial:    3.37 cm/s LV PW:         0.90 cm      LV E/e' medial:  33.5 LV IVS:        0.90 cm      LV e' lateral:  4.79 cm/s LVOT diam:     2.00 cm      LV E/e' lateral: 23.6 LV SV:         40 LV SV Index:   29 LVOT Area:     3.14 cm  LV Volumes (MOD) LV vol d, MOD A2C: 112.0 ml LV vol d, MOD A4C: 111.0 ml LV vol s, MOD A2C: 75.9 ml LV vol s, MOD A4C: 72.8 ml LV SV MOD A2C:     36.1 ml LV SV MOD A4C:     111.0 ml LV SV MOD BP:      40.1 ml RIGHT VENTRICLE            IVC RV Basal diam:  4.10 cm    IVC diam: 1.90 cm RV S prime:     5.98 cm/s TAPSE (M-mode): 1.5 cm LEFT ATRIUM             Index        RIGHT ATRIUM           Index LA diam:        4.10 cm 2.94 cm/m   RA Area:     13.20 cm LA Vol (A2C):   84.8 ml 60.78 ml/m  RA Volume:   35.40 ml  25.37 ml/m LA Vol (A4C):   75.4 ml 54.04 ml/m LA Biplane Vol: 83.7 ml 59.99 ml/m  AORTIC VALVE AV Area (Vmax):    1.88 cm AV Area (Vmean):   1.64 cm AV Area (VTI):     1.67 cm AV Vmax:           112.00 cm/s AV Vmean:          82.300 cm/s AV VTI:            0.239 m AV Peak Grad:      5.0 mmHg AV Mean Grad:      3.0 mmHg LVOT Vmax:         67.20 cm/s LVOT Vmean:        42.900 cm/s LVOT VTI:          0.127 m LVOT/AV VTI ratio: 0.53  AORTA Ao Root diam: 2.50 cm MITRAL VALVE                TRICUSPID VALVE MV Area (PHT): 4.96 cm     TR Peak grad:   24.2 mmHg MV Area VTI:   1.61 cm     TR Vmax:        246.00 cm/s MV Peak grad:  8.2 mmHg MV Mean grad:  3.0 mmHg     SHUNTS MV Vmax:        1.43 m/s     Systemic VTI:  0.13 m MV Vmean:      82.4 cm/s    Systemic Diam: 2.00 cm MV Decel Time: 153 msec MV E velocity: 113.00 cm/s MV A velocity: 77.10 cm/s MV E/A ratio:  1.47 Aditya Sabharwal Electronically signed by Ria Commander Signature Date/Time: 01/17/2023/9:38:42 AM    Final    DG Abd 1 View Result Date: 01/17/2023 CLINICAL DATA:  OG tube placement. EXAM: ABDOMEN - 1 VIEW COMPARISON:  None Available. FINDINGS: Tip and side port of the enteric tube below the diaphragm in the stomach. Right aspect of the abdomen is not included in the field of view. No dilated bowel in the visualized abdomen. Calcified uterine fibroids IMPRESSION: Tip and side port of the enteric tube below the diaphragm in the stomach. Electronically  Signed   By: Andrea Gasman M.D.   On: 01/17/2023 03:37   DG CHEST PORT 1 VIEW Result Date: 01/17/2023 CLINICAL DATA:  Check endotracheal tube placement EXAM: PORTABLE CHEST 1 VIEW COMPARISON:  Film from the previous day. FINDINGS: Endotracheal tube is noted in satisfactory position 2 cm above the carina. Cardiac shadow is stable. Persistent right basilar airspace opacity is noted. No sizable effusion is noted. Skin fold is noted over the left chest. No acute bony abnormality is seen. IMPRESSION: Persistent right basilar opacity. Endotracheal tube in satisfactory position. Electronically Signed   By: Oneil Devonshire M.D.   On: 01/17/2023 00:46   DG Chest Portable 1 View Result Date: 01/16/2023 CLINICAL DATA:  Shortness of breath. EXAM: PORTABLE CHEST 1 VIEW COMPARISON:  December 21, 2022. FINDINGS: Stable cardiomediastinal silhouette. Mild central pulmonary vascular congestion is noted. Increased right basilar opacity is noted concerning for worsening atelectasis or pneumonia with associated effusion. Degenerative changes are seen involving both glenohumeral joints. IMPRESSION: Increased right basilar opacity is noted concerning for worsening atelectasis or pneumonia with  associated pleural effusion. Electronically Signed   By: Lynwood Landy Raddle M.D.   On: 01/16/2023 11:07     Labs:   Basic Metabolic Panel: Recent Labs  Lab 01/22/23 0313 01/23/23 0311 01/24/23 0307 01/25/23 0312 01/26/23 0843  NA 133* 134* 131* 128* 132*  K 4.7 4.7 4.1 4.1 4.3  CL 91* 93* 95* 94* 97*  CO2 32 32 26 24 24   GLUCOSE 98 102* 111* 116* 98  BUN 25* 18 14 12 14   CREATININE 0.54 0.56 0.58 0.51 0.58  CALCIUM  8.7* 8.6* 8.6* 8.1* 8.9  MG 1.7 2.1 2.3 2.1  --    GFR Estimated Creatinine Clearance: 37.8 mL/min (by C-G formula based on SCr of 0.58 mg/dL). Liver Function Tests: No results for input(s): AST, ALT, ALKPHOS, BILITOT, PROT, ALBUMIN in the last 168 hours. No results for input(s): LIPASE, AMYLASE in the last 168 hours. No results for input(s): AMMONIA in the last 168 hours. Coagulation profile No results for input(s): INR, PROTIME in the last 168 hours.  CBC: Recent Labs  Lab 01/22/23 0313 01/23/23 0311 01/24/23 0307 01/25/23 0312 01/26/23 0843  WBC 11.3* 7.8 8.2 10.9* 7.5  NEUTROABS  --  5.6 5.9 8.4*  --   HGB 7.5* 7.8* 9.9* 9.9* 10.6*  HCT 25.3* 26.7* 30.8* 31.7* 33.0*  MCV 89.1 90.2 86.8 89.0 88.2  PLT 386 423* 412* 402* 310   Cardiac Enzymes: No results for input(s): CKTOTAL, CKMB, CKMBINDEX, TROPONINI in the last 168 hours. BNP: Invalid input(s): POCBNP CBG: Recent Labs  Lab 01/26/23 1553 01/26/23 1945 01/27/23 0010 01/27/23 0413 01/27/23 0709  GLUCAP 202* 74 130* 99 93   D-Dimer No results for input(s): DDIMER in the last 72 hours. Hgb A1c No results for input(s): HGBA1C in the last 72 hours. Lipid Profile No results for input(s): CHOL, HDL, LDLCALC, TRIG, CHOLHDL, LDLDIRECT in the last 72 hours. Thyroid  function studies No results for input(s): TSH, T4TOTAL, T3FREE, THYROIDAB in the last 72 hours.  Invalid input(s): FREET3 Anemia work up No results for input(s):  VITAMINB12, FOLATE, FERRITIN, TIBC, IRON, RETICCTPCT in the last 72 hours. Microbiology No results found for this or any previous visit (from the past 240 hours).   Discharge Instructions:   Discharge Instructions     Call MD for:  persistant nausea and vomiting   Complete by: As directed    Call MD for:  severe uncontrolled pain   Complete  by: As directed    Call MD for:  temperature >100.4   Complete by: As directed    Discharge instructions   Complete by: As directed    Follow-up with your primary care provider at the skilled nursing facility in 3 to 5 days.  Check blood work at that time.  Plan for 2D echocardiogram in 3 weeks.  Take medications as prescribed.  Follow-up with cardiology as outpatient.  Seek medical attention for worsening symptoms.   Discharge wound care:   Complete by: As directed    Heel Left;Posterior Unstageable - Full thickness tissue loss in which the base of the injury is covered by slough (yellow, tan, gray, green or brown) and/or eschar (tan, brown or black) in the wound bed. Blackened skin on bony pr   Increase activity slowly   Complete by: As directed       Allergies as of 01/27/2023   No Known Allergies      Medication List     PAUSE taking these medications    Tagrisso  80 MG tablet Wait to take this until your doctor or other care provider tells you to start again. Generic drug: osimertinib  mesylate Take 1 tablet (80 mg total) by mouth daily.       STOP taking these medications    atenolol  50 MG tablet Commonly known as: TENORMIN    atenolol -chlorthalidone  50-25 MG tablet Commonly known as: TENORETIC    Eliquis  5 MG Tabs tablet Generic drug: apixaban    potassium chloride  SA 20 MEQ tablet Commonly known as: KLOR-CON  M   Wixela Inhub 250-50 MCG/ACT Aepb Generic drug: fluticasone-salmeterol       TAKE these medications    acetaminophen  500 MG tablet Commonly known as: TYLENOL  Take 500 mg by mouth in the  morning, at noon, and at bedtime.   alum & mag hydroxide-simeth 200-200-20 MG/5ML suspension Commonly known as: MAALOX/MYLANTA Take 30 mLs by mouth every 4 (four) hours as needed for indigestion or heartburn.   arformoterol  15 MCG/2ML Nebu Commonly known as: BROVANA  Take 2 mLs (15 mcg total) by nebulization 2 (two) times daily.   aspirin  EC 81 MG tablet Take 1 tablet (81 mg total) by mouth daily. What changed:  medication strength how much to take   budesonide  0.5 MG/2ML nebulizer solution Commonly known as: PULMICORT  Take 2 mLs (0.5 mg total) by nebulization 2 (two) times daily for 5 days.   dapagliflozin  propanediol 10 MG Tabs tablet Commonly known as: FARXIGA  Take 1 tablet (10 mg total) by mouth daily. Start taking on: January 28, 2023   furosemide  40 MG tablet Commonly known as: LASIX  Take 1 tablet (40 mg total) by mouth daily. Start taking on: January 28, 2023   ipratropium-albuterol  0.5-2.5 (3) MG/3ML Soln Commonly known as: DUONEB Take 3 mLs by nebulization every 6 (six) hours as needed.   lidocaine  5 % Commonly known as: LIDODERM  Place 1 patch onto the skin daily. Remove & Discard patch within 12 hours or as directed by MD   metoprolol  succinate 25 MG 24 hr tablet Commonly known as: TOPROL -XL Take 1 tablet (25 mg total) by mouth daily. Start taking on: January 28, 2023   montelukast  10 MG tablet Commonly known as: SINGULAIR  Take 10 mg by mouth at bedtime.   multivitamin with minerals Tabs tablet Take 1 tablet by mouth daily. Start taking on: January 28, 2023   oxyCODONE  5 MG immediate release tablet Commonly known as: Oxy IR/ROXICODONE  Take 1 tablet (5 mg total) by mouth every 6 (  six) hours as needed for moderate pain (pain score 4-6), breakthrough pain or severe pain (pain score 7-10).   pantoprazole  40 MG tablet Commonly known as: PROTONIX  Take 1 tablet (40 mg total) by mouth daily. Start taking on: January 28, 2023   polyethylene glycol 17 g  packet Commonly known as: MIRALAX  / GLYCOLAX  Take 17 g by mouth daily as needed for mild constipation or moderate constipation. Meds to bed, thanks What changed: reasons to take this   revefenacin  175 MCG/3ML nebulizer solution Commonly known as: YUPELRI  Take 3 mLs (175 mcg total) by nebulization daily. Start taking on: January 28, 2023   rosuvastatin  20 MG tablet Commonly known as: CRESTOR  Take 1 tablet (20 mg total) by mouth daily. Start taking on: January 28, 2023               Discharge Care Instructions  (From admission, onward)           Start     Ordered   01/27/23 0000  Discharge wound care:       Comments: Heel Left;Posterior Unstageable - Full thickness tissue loss in which the base of the injury is covered by slough (yellow, tan, gray, green or brown) and/or eschar (tan, brown or black) in the wound bed. Blackened skin on bony pr   01/27/23 1056            Contact information for follow-up providers     Vannie Reche RAMAN, NP Follow up.   Specialty: Cardiology Why: Davene Nicolas at [facility] at [facility] Cardiology follow-up arranged Tuesday Feb 07, 2023 at 8:50 AM Arrive 15 minutes prior to appointment to check in Contact information: [address] [place] [address] [phone]              Contact information for after-discharge care     Destination     [facility] Preferred SNF .   Service: Skilled Nursing Contact information: [address] [place]  [address] [phone]                      Time coordinating discharge: 39 minutes  Signed:  Casin Federici  [facility] 01/27/2023, 11:06 AM

## 2023-01-27 NOTE — TOC Transition Note (Signed)
 Transition of Care [facility]) - Discharge Note   Patient Details  Name: Anna Finley MRN: [id number] Date of Birth: Oct 05, 1945  Transition of Care [facility]) CM/SW Contact:  Tawni CHRISTELLA Eva, LCSW Phone Number: 01/27/2023, 10:27 AM   Clinical Narrative:    Pt's insurance shara was approved for SNF placement at [facility]. Pt's room assignment 507-P , RN to call report to [phone]. CSW spoke with pt's daughter to inform her about d/c , she agrees with plan. PTAR called, no further TOC needs, TOC sign off.     Final next level of care: Skilled Nursing Facility Barriers to Discharge: No Barriers Identified   Patient Goals and CMS Choice Patient states their goals for this hospitalization and ongoing recovery are:: SNF to get stronger CMS Medicare.gov Compare Post Acute Care list provided to:: Patient Choice offered to / list presented to : Patient      Discharge Placement                  Name of family member notified: [name], [name] (Daughter)  [phone] (Mobile) Patient and family notified of of transfer: 01/27/23  Discharge Plan and Services Additional resources added to the After Visit Summary for   In-house Referral: Clinical Social Work   Post Acute Care Choice: Skilled Nursing Facility          DME Arranged: N/A DME Agency: NA                  Social Drivers of Health (SDOH) Interventions SDOH Screenings   Food Insecurity: No Food Insecurity (01/16/2023)  Housing: Low Risk  (01/17/2023)  Transportation Needs: No Transportation Needs (01/17/2023)  Utilities: Not At Risk (01/17/2023)  Social Connections: Moderately Integrated (01/17/2023)  Tobacco Use: Medium Risk (01/16/2023)     Readmission Risk Interventions     No data to display

## 2023-01-27 NOTE — Progress Notes (Signed)
 Occupational Therapy Treatment Patient Details Name: Anna Finley MRN: [id number] DOB: 07/17/1945 Today's Date: 01/27/2023   History of present illness Patient is a 78 year old female who was admitted on 12/30 with cough and worsening SOB. She was dx with RLL PNA . On 12/31 while in the ED, pt had a cardiopulmonary arrest, was resuscitated and possibly achieved ROSC between 6 to 15 minutes post resuscitation. Noted to have possible V-fib reading at some point, with prolonged QTc. Patient was subsequently intubated and was admitted in the ICU for further management. Patient was extubated on 01/21/23.   PMH: NSCLC, COPD, right IJ thrombus, HTN, anemia, left hip fracture status post ORIF 12/16/2022, and severe PCM   OT comments  Pt pleasant and agreeable to participate in session. Session focused on transfers and toileting hygiene. Pt making progress towards goals, RN assisting during session for pericare and transfer. Pt requires maxA for pericare in standing, transfers with +2 for safety, modA using RW EOB>recliner. OT will continue to follow for functional gains. Discharge recommendation appropriate. Patient will benefit from continued inpatient follow up therapy, <3 hours/day       If plan is discharge home, recommend the following:  A lot of help with bathing/dressing/bathroom;Assistance with cooking/housework;Direct supervision/assist for medications management;Assist for transportation;Direct supervision/assist for financial management;Help with stairs or ramp for entrance;A lot of help with walking and/or transfers   Equipment Recommendations  None recommended by OT    Recommendations for Other Services      Precautions / Restrictions Precautions Precautions: Fall Precaution Comments: monitor VS Restrictions Weight Bearing Restrictions Per Provider Order: No       Mobility Bed Mobility Overal bed mobility: Needs Assistance Bed Mobility: Supine to Sit     Supine to sit:  Supervision, Used rails, HOB elevated     General bed mobility comments: cues to initiate  moving legs, assist to scoot to bed edge, able to sit upright    Transfers Overall transfer level: Needs assistance Equipment used: Rolling walker (2 wheels) Transfers: Sit to/from Stand, Bed to chair/wheelchair/BSC Sit to Stand: Mod assist     Step pivot transfers: Mod assist, +2 physical assistance     General transfer comment: +2 for safety, min-mod to step pivot to recliner with cues for sequencing     Balance Overall balance assessment: History of Falls, Needs assistance Sitting-balance support: Bilateral upper extremity supported, Feet supported Sitting balance-Leahy Scale: Fair     Standing balance support: Bilateral upper extremity supported, During functional activity, Reliant on assistive device for balance Standing balance-Leahy Scale: Poor                             ADL either performed or assessed with clinical judgement   ADL Overall ADL's : Needs assistance/impaired                 Upper Body Dressing : Minimal assistance;Sitting   Lower Body Dressing: Maximal assistance;+2 for physical assistance;Sit to/from stand       Toileting- Architect and Hygiene: Maximal assistance;Sit to/from stand;+2 for physical assistance;+2 for safety/equipment Toileting - Clothing Manipulation Details (indicate cue type and reason): found incontienent of urine/pure wick failure in bed; RN assists +2 for pericare sit<>stand and purewick change     Functional mobility during ADLs: +2 for physical assistance;+2 for safety/equipment;Rolling walker (2 wheels);Moderate assistance General ADL Comments: Pt hopeful to discharge today; found with soiled linen due to incontinent episode. +2 for pericare and  transfer with RW to recliner. Cues for hand placement, poor carryover throughout      Cognition Arousal: Alert Behavior During Therapy: WFL for tasks  assessed/performed Overall Cognitive Status: No family/caregiver present to determine baseline cognitive functioning Area of Impairment: Orientation, Memory, Following commands                 Orientation Level: Disoriented to, Place, Time, Situation   Memory: Decreased short-term memory Following Commands: Follows one step commands consistently, Follows one step commands with increased time       General Comments: follows  directions with increased time and repetition                   Pertinent Vitals/ Pain       Pain Assessment Pain Assessment: No/denies pain   Frequency  Min 1X/week        Progress Toward Goals  OT Goals(current goals can now be found in the care plan section)  Progress towards OT goals: Progressing toward goals  Acute Rehab OT Goals OT Goal Formulation: Patient unable to participate in goal setting Time For Goal Achievement: 02/06/23 Potential to Achieve Goals: Fair ADL Goals Pt Will Perform Grooming: with supervision;sitting Pt Will Perform Lower Body Dressing: with supervision;sit to/from stand Pt Will Transfer to Toilet: stand pivot transfer;bedside commode Pt Will Perform Toileting - Clothing Manipulation and hygiene: with contact guard assist;sit to/from stand  Plan         AM-PAC OT 6 Clicks Daily Activity     Outcome Measure   Help from another person eating meals?: A Little Help from another person taking care of personal grooming?: A Little Help from another person toileting, which includes using toliet, bedpan, or urinal?: A Lot Help from another person bathing (including washing, rinsing, drying)?: A Lot Help from another person to put on and taking off regular upper body clothing?: A Little Help from another person to put on and taking off regular lower body clothing?: A Lot 6 Click Score: 15    End of Session Equipment Utilized During Treatment: Gait belt;Rolling walker (2 wheels)  OT Visit Diagnosis:  Unsteadiness on feet (R26.81);Other abnormalities of gait and mobility (R26.89)   Activity Tolerance Patient tolerated treatment well   Patient Left in chair;with call bell/phone within reach;with chair alarm set   Nurse Communication Mobility status        Time: 8799-8775 OT Time Calculation (min): 24 min  Charges: OT General Charges $OT Visit: 1 Visit OT Treatments $Self Care/Home Management : 23-37 mins  Manal Kreutzer L. Kedra Mcglade, OTR/L  01/27/23, 1:00 PM

## 2023-02-01 ENCOUNTER — Other Ambulatory Visit: Payer: Self-pay

## 2023-02-01 NOTE — Progress Notes (Signed)
 Specialty Pharmacy Refill Coordination Note  Anna Finley is a 78 y.o. female contacted today regarding refills of specialty medication(s) Osimertinib  Mesylate (TAGRISSO )   Patient requested Delivery   Delivery date: 02/06/23   Verified address: [address]   Medication will be filled on 01.17.25.

## 2023-02-03 ENCOUNTER — Other Ambulatory Visit: Payer: Self-pay

## 2023-02-07 ENCOUNTER — Ambulatory Visit (HOSPITAL_BASED_OUTPATIENT_CLINIC_OR_DEPARTMENT_OTHER): Payer: 59 | Admitting: Family

## 2023-02-10 ENCOUNTER — Other Ambulatory Visit: Payer: Self-pay

## 2023-02-12 NOTE — Progress Notes (Deleted)
 Cardiology Office Note:    Date:  02/12/2023   ID:  Anna Finley, DOB February 21, 1945, MRN [id number]  PCP:  [name], MD  Cardiologist:  Annabella Scarce, MD { Click to update primary MD,subspecialty MD or APP then REFRESH:1}    Referring MD: [name], MD   Chief Complaint: hospital follow-up of cardiopulmonary arrest and CHF  History of Present Illness:    Anna Finley is a 78 y.o. 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 Scarce and presents today for hospital follow-up of cardiopulmonary arrest and CHF.   Patient was first seen by Cardiology during recent admission. She was admitted from 01/16/2023 to 01/27/2023 for sepsis secondary to right lower lobe pneumonia. While in the ED, she suffered a cardiopulmonary arrest. She was found to have a shockable rhythm and underwent CPR.  ROSC was achieved after 6-15 minutes. There was question of possible ventricular fibrillation at some point during arrest with prolonged QTc and hypokalemia. Echo showed LVEF of 30-35% with global hypokinesis and grade 2 diastolic dysfunction and mildly reduced RV function. Chest CT did show evidence of coronary artery calcifications. However, she was not felt to be a good candidate for cardiac catheterization with underlying multiple comorbidities. Therefore, she was treated medically. She was diuresed and started on GDMT.  Hospitalization was complicated by multiple rib fracture and right pleural effusion from CPR and acute on chronic anemia with hemoglobin as low as 6.9 requiring 1 unit of PRBCs.  Tagrisso , which she was on for lung cancer, was  stopped as this can cause cardiac complications including cardiomyopathy and prolonged QTc. She was advised to follow-up with Oncology as an outpatient.   Patient presents today for follow-up. ***  Cardiopulmonary Arrest Chronic HFrEF Patient was recently admitted in 12/2021 for severe sepsis secondary to pneumonia. Hospitalization was complicated by cardiopulmonary arrest. She was found to have a shockable rhythm and underwent CPR.  ROSC was achieved after 6-15 minutes. There was question of possible ventricular fibrillation at some point during arrest with prolonged QTc and hypokalemia. Echo showed LVEF of 30-35% with global hypokinesis and grade 2 diastolic dysfunction and mildly reduced RV function. She was not felt to be a good candidate for cardiac catheterization given multiple comorbidities including stage IV lung cancer, frequent falls, and acute on chronic anemia requiring a blood transfusion. Therefore, she was treated medically.  - Chest pain *** - Euvolemic on exam. *** - Continue Lasix  40mg  daily.  - Continue Toprol -XL 25mg  daily.  - Continue Farxiga  10mg  daily.  - ARB/ ARNI +/-  - Discussed importance of daily weights and sodium/ fluid restrictions. *** - Will repeat BMET today. - Will repeat Echo in 3 months to reassess LV function.  Non-Sustained VT She was noted to have some NSVT during recent admission.  - EKG today shows ***. QTc *** - No palpitations.  - Continue Toprol -XL 25mg  daily.   Hypertension BP well controlled. *** - Continue medications for CHF as above.  History of Right Internal Jugular Thrombus Diagnosed in 2023. Completed treatment with Eliquis .   EKGs/Labs/Other Studies Reviewed:    The following studies were reviewed:  Echocardiogram 01/17/2023: Impressions: 1. Technically difficult study with  limited visualization of cardiac  structures.   2. Left ventricular ejection fraction, by estimation, is 30 to 35%. The  left ventricle has moderately  decreased function. The left ventricle  demonstrates global hypokinesis. Left ventricular diastolic parameters are  consistent with Grade II diastolic  dysfunction (pseudonormalization).   3. Right ventricular systolic function is mildly reduced. The right  ventricular size is normal. There is normal pulmonary artery systolic  pressure.   4. Left atrial size was moderately dilated.   5. The mitral valve is normal in structure. Trivial mitral valve  regurgitation. No evidence of mitral stenosis.   6. The aortic valve is grossly normal. Aortic valve regurgitation is not  visualized. No aortic stenosis is present.   7. The inferior vena cava is normal in size with greater than 50%  respiratory variability, suggesting right atrial pressure of 3 mmHg.   EKG:  EKG ordered today. EKG personally reviewed and demonstrates ***.  Recent Labs: 01/17/2023: TSH 1.236 01/19/2023: ALT 27 01/25/2023: B Natriuretic Peptide 501.2; Magnesium  2.1 01/26/2023: BUN 14; Creatinine, Ser 0.58; Hemoglobin 10.6; Platelets 310; Potassium 4.3; Sodium 132  Recent Lipid Panel    Component Value Date/Time   CHOL 232 (H) 01/24/2023 0307   TRIG 97 01/24/2023 0307   HDL 62 01/24/2023 0307   CHOLHDL 3.7 01/24/2023 0307   VLDL 19 01/24/2023 0307   LDLCALC 151 (H) 01/24/2023 0307    Physical Exam:    Vital Signs: There were no vitals taken for this visit.    Wt Readings from Last 3 Encounters:  01/27/23 88 lb 2.9 oz (40 kg)  08/23/22 87 lb 11.2 oz (39.8 kg)  05/04/22 85 lb 14.4 oz (39 kg)     General: 78 y.o. female in no acute distress. HEENT: Normocephalic and atraumatic. Sclera clear.  Neck: Supple. No carotid bruits. No JVD. Heart: *** RRR. Distinct S1 and S2. No murmurs, gallops, or rubs.  Lungs: No increased work of breathing. Clear to ausculation bilaterally. No wheezes, rhonchi, or rales.  Abdomen: Soft, non-distended, and non-tender to palpation.  Extremities: No lower extremity edema.  Radial and distal  pedal pulses 2+ and equal bilaterally. Skin: Warm and dry. Neuro: No focal deficits. Psych: Normal affect. Responds appropriately.   Assessment:    No diagnosis found.  Plan:     Disposition: Follow up in ***   Signed, Aline FORBES Door, PA-C  02/12/2023 11:44 AM     [facility]

## 2023-02-17 ENCOUNTER — Other Ambulatory Visit: Payer: Self-pay | Admitting: Physician Assistant

## 2023-02-17 DIAGNOSIS — C3490 Malignant neoplasm of unspecified part of unspecified bronchus or lung: Secondary | ICD-10-CM

## 2023-02-17 NOTE — Progress Notes (Deleted)
 [facility] OFFICE PROGRESS NOTE  [name], MD [address] [place] [address]  DIAGNOSIS: Stage IV (T3, N2, M1 C) non-small cell lung cancer, adenocarcinoma presented with large right lower lobe lung mass in addition to subcarinal lymphadenopathy as well as bone metastasis diagnosed in April 2023   Molecular studies by foundation 1 showed EGFR T790M, Exon 19 deletion [id number]) AKT2- Aplification-equivocal ARID1A- [id number]*1 RICTOR- amplification-equivocal   PDL1 Expression 50%  PRIOR THERAPY: : Palliative radiotherapy to the metastatic T3 bone lesion under the care of Dr. Mitzi Hansen   CURRENT THERAPY: Tagrisso 80 mg p.o. daily.  First dose started May 22, 2021.  Status post *** months of treatment.   INTERVAL HISTORY: Anna Finley 78 y.o. female returns returns freturns to the clinic today for follow-up visit accompanied by her granddaughter.  The patient was last seen in the clinic by Dr. Arbutus Ped on 08/23/2022.  She is currently undergoing targeted treatment with Tagrisso. She was lost to follow up since that time.   The patient is not compliant with appointments and relies on her family for transportation who are also not reliable. In the interval since she was last being seen, the patient was hospitalized at [facility] for a fall that resulted in  fracture. She underwent left hip arthroplasty on 10/23/22.   She was supposed to follow up in the clinic but was in serious pain and left before being seen and presented back at the [facility] ER. 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   The patient was hospitalized again at [facility] recently on 01/16/23-01/27/23. She presented with shortness of breath. She was found to have sepsis due to pneumonia. She also had prolonged QT and went into cardiopulmonary arrest during her hospitalization and was in the ICU. She also had moderate right pleural  effusion. She was completed on antibiotics. She is also seeing cardiology for crestor, lasix, and metoprolol. They recommended outpatient echocardiogram.  Her tagrisso was held.   Since being discharged, she is feeling ***. She is currently living at ***. She confirmed she has not taken Tagrisso since ***.   Denies fevers, shortness of breath, chest pain, or sore throat. Denies sick contacts.   Anemia ***  She has poor nutrition and is very cachectic appearing at baseline. She reports a good appetite though. Denies any nausea, vomiting, diarrhea, or constipation.  Denies any rashes or skin changes.  She does report some intermittent headaches. Breathing ***. Her most recent CT was on *** She is here for evaluation and to review her scan results.     MEDICAL HISTORY: Past Medical History:  Diagnosis Date   COPD (chronic obstructive pulmonary disease) with chronic bronchitis (HCC)    HTN (hypertension)    Pneumothorax     ALLERGIES:  has no known allergies.  MEDICATIONS:  Current Outpatient Medications  Medication Sig Dispense Refill   acetaminophen (TYLENOL) 500 MG tablet Take 500 mg by mouth in the morning, at noon, and at bedtime.     alum & mag hydroxide-simeth (MAALOX/MYLANTA) 200-200-20 MG/5ML suspension Take 30 mLs by mouth every 4 (four) hours as needed for indigestion or heartburn.     arformoterol (BROVANA) 15 MCG/2ML NEBU Take 2 mLs (15 mcg total) by nebulization 2 (two) times daily.     aspirin EC 81 MG tablet Take 1 tablet (81 mg total) by mouth daily.     budesonide (PULMICORT) 0.5  MG/2ML nebulizer solution Take 2 mLs (0.5 mg total) by nebulization 2 (two) times daily for 5 days.     dapagliflozin propanediol (FARXIGA) 10 MG TABS tablet Take 1 tablet (10 mg total) by mouth daily.     furosemide (LASIX) 40 MG tablet Take 1 tablet (40 mg total) by mouth daily.     ipratropium-albuterol (DUONEB) 0.5-2.5 (3) MG/3ML SOLN Take 3 mLs by nebulization every 6 (six) hours as  needed.     lidocaine (LIDODERM) 5 % Place 1 patch onto the skin daily. Remove & Discard patch within 12 hours or as directed by MD 30 patch 0   metoprolol succinate (TOPROL-XL) 25 MG 24 hr tablet Take 1 tablet (25 mg total) by mouth daily.     montelukast (SINGULAIR) 10 MG tablet Take 10 mg by mouth at bedtime.     Multiple Vitamin (MULTIVITAMIN WITH MINERALS) TABS tablet Take 1 tablet by mouth daily.     [Paused] osimertinib mesylate (TAGRISSO) 80 MG tablet Take 1 tablet (80 mg total) by mouth daily. 30 tablet 3   oxyCODONE (OXY IR/ROXICODONE) 5 MG immediate release tablet Take 1 tablet (5 mg total) by mouth every 6 (six) hours as needed for moderate pain (pain score 4-6), breakthrough pain or severe pain (pain score 7-10). 10 tablet 0   pantoprazole (PROTONIX) 40 MG tablet Take 1 tablet (40 mg total) by mouth daily.     polyethylene glycol (MIRALAX / GLYCOLAX) 17 g packet Take 17 g by mouth daily as needed for mild constipation or moderate constipation. Meds to bed, thanks     revefenacin (YUPELRI) 175 MCG/3ML nebulizer solution Take 3 mLs (175 mcg total) by nebulization daily.     rosuvastatin (CRESTOR) 20 MG tablet Take 1 tablet (20 mg total) by mouth daily.     No current facility-administered medications for this visit.    SURGICAL HISTORY:  Past Surgical History:  Procedure Laterality Date   BRONCHIAL NEEDLE ASPIRATION BIOPSY  04/20/2021   Procedure: BRONCHIAL NEEDLE ASPIRATION BIOPSIES;  Surgeon: Lorin Glass, MD;  Location: [facility] ENDOSCOPY;  Service: Endoscopy;;   BRONCHIAL WASHINGS  04/20/2021   Procedure: BRONCHIAL WASHINGS;  Surgeon: Lorin Glass, MD;  Location: [facility];  Service: Endoscopy;;   ENDOBRONCHIAL ULTRASOUND N/A 04/20/2021   Procedure: ENDOBRONCHIAL ULTRASOUND;  Surgeon: Lorin Glass, MD;  Location: [facility]OSCOPY;  Service: Endoscopy;  Laterality: N/A;   HIP ARTHROPLASTY     VIDEO BRONCHOSCOPY Left 04/20/2021   Procedure: VIDEO BRONCHOSCOPY WITH FLUORO;  Surgeon:  Lorin Glass, MD;  Location: [facility] ENDOSCOPY;  Service: Endoscopy;  Laterality: Left;  EBUS    REVIEW OF SYSTEMS:   Review of Systems  Constitutional: Negative for appetite change, chills, fatigue, fever and unexpected weight change.  HENT:   Negative for mouth sores, nosebleeds, sore throat and trouble swallowing.   Eyes: Negative for eye problems and icterus.  Respiratory: Negative for cough, hemoptysis, shortness of breath and wheezing.   Cardiovascular: Negative for chest pain and leg swelling.  Gastrointestinal: Negative for abdominal pain, constipation, diarrhea, nausea and vomiting.  Genitourinary: Negative for bladder incontinence, difficulty urinating, dysuria, frequency and hematuria.   Musculoskeletal: Negative for back pain, gait problem, neck pain and neck stiffness.  Skin: Negative for itching and rash.  Neurological: Negative for dizziness, extremity weakness, gait problem, headaches, light-headedness and seizures.  Hematological: Negative for adenopathy. Does not bruise/bleed easily.  Psychiatric/Behavioral: Negative for confusion, depression and sleep disturbance. The patient is not nervous/anxious.  PHYSICAL EXAMINATION:  There were no vitals taken for this visit.  ECOG PERFORMANCE STATUS: {CHL ONC ECOG [id number]  Physical Exam  Constitutional: Oriented to person, place, and time and well-developed, well-nourished, and in no distress. No distress.  HENT:  Head: Normocephalic and atraumatic.  Mouth/Throat: Oropharynx is clear and moist. No oropharyngeal exudate.  Eyes: Conjunctivae are normal. Right eye exhibits no discharge. Left eye exhibits no discharge. No scleral icterus.  Neck: Normal range of motion. Neck supple.  Cardiovascular: Normal rate, regular rhythm, normal heart sounds and intact distal pulses.   Pulmonary/Chest: Effort normal and breath sounds normal. No respiratory distress. No wheezes. No rales.  Abdominal: Soft. Bowel sounds are normal.  Exhibits no distension and no mass. There is no tenderness.  Musculoskeletal: Normal range of motion. Exhibits no edema.  Lymphadenopathy:    No cervical adenopathy.  Neurological: Alert and oriented to person, place, and time. Exhibits normal muscle tone. Gait normal. Coordination normal.  Skin: Skin is warm and dry. No rash noted. Not diaphoretic. No erythema. No pallor.  Psychiatric: Mood, memory and judgment normal.  Vitals reviewed.  LABORATORY DATA: Lab Results  Component Value Date   WBC 7.5 01/26/2023   HGB 10.6 (L) 01/26/2023   HCT 33.0 (L) 01/26/2023   MCV 88.2 01/26/2023   PLT 310 01/26/2023      Chemistry      Component Value Date/Time   NA 132 (L) 01/26/2023 0843   K 4.3 01/26/2023 0843   CL 97 (L) 01/26/2023 0843   CO2 24 01/26/2023 0843   BUN 14 01/26/2023 0843   CREATININE 0.58 01/26/2023 0843   CREATININE 0.84 10/18/2022 1557      Component Value Date/Time   CALCIUM 8.9 01/26/2023 0843   ALKPHOS 126 01/19/2023 0551   AST 26 01/19/2023 0551   AST 11 (L) 10/18/2022 1557   ALT 27 01/19/2023 0551   ALT 5 10/18/2022 1557   BILITOT 0.3 01/19/2023 0551   BILITOT 0.3 10/18/2022 1557       RADIOGRAPHIC STUDIES:  DG CHEST PORT 1 VIEW Result Date: 01/25/2023 CLINICAL DATA:  Shortness of breath. EXAM: PORTABLE CHEST 1 VIEW COMPARISON:  Chest radiograph dated 01/21/2023. FINDINGS: Similar appearance of right pleural effusion and right mid to lower lung field opacity. Trace left pleural effusion. No pneumothorax. Stable cardiac silhouette. No acute osseous pathology. IMPRESSION: Similar appearance of right pleural effusion and right mid to lower lung field opacity. Electronically Signed   By: Elgie Collard M.D.   On: 01/25/2023 14:20   DG CHEST PORT 1 VIEW Result Date: 01/21/2023 CLINICAL DATA:  Pleural effusion. EXAM: PORTABLE CHEST 1 VIEW COMPARISON:  Radiographs 01/18/2022 and 01/17/2023. Chest CTA 01/17/2023 band 12/21/2022. FINDINGS: 1022 hours. The  endotracheal and enteric tubes are unchanged in position. The visualized heart size and mediastinal contours are stable. Unchanged moderate-sized right pleural effusion with associated right lower lobe airspace disease and volume loss. The left lung appears clear. No evidence of pneumothorax. The bones appear unchanged with advanced glenohumeral degenerative changes bilaterally. IMPRESSION: Unchanged moderate-sized right pleural effusion with associated right lower lobe airspace disease and volume loss. No evidence of pneumothorax. Electronically Signed   By: Carey Bullocks M.D.   On: 01/21/2023 11:30   DG CHEST PORT 1 VIEW Result Date: 01/19/2023 CLINICAL DATA:  Wheezing EXAM: PORTABLE CHEST 1 VIEW COMPARISON:  01/17/2023 FINDINGS: Cardiac shadow is stable. Endotracheal tube and gastric catheter are noted in satisfactory position. Increasing right-sided pleural effusion is noted with likely underlying atelectasis.  Left lung is clear. IMPRESSION: Increasing right-sided pleural effusion with underlying atelectasis. Electronically Signed   By: Alcide Clever M.D.   On: 01/19/2023 23:28     ASSESSMENT/PLAN:  This is a very pleasant 78 year old African-American female diagnosed with stage IV (T3, N2, M1 C) non-small cell lung cancer, adenocarcinoma.  She presented with a left lower lobe lung mass in addition to subcarinal adenopathy.  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  She was diagnosed in April 2023.   She underwent palliative radiotherapy to the metastatic T3 bone lesion under the care of Dr. Mitzi Hansen   Her molecular studies show that she is positive for an EGFR mutation with deletion in exon 19 in addition to T790 and.  Her PD-L1 expression is 50 percent.   Therefore the patient is currently undergoing targeted treatment with Tagrisso 80 mg p.o. daily rather than immunotherapy.   Her first dose of treatment was on 05/22/21.   She was lost to follow up from  August 2024 to January 2025. Her tagrisso has been on hold since she was hospitalized in late December/early January 2025 for sepsis due to pneumonia. She had cardiopulmonary arrest while in the ED. Therefore her Edgar Frisk was on hold since she was hospitalized.   The patient was seen with Dr. Arbutus Ped today.  Dr. Arbutus Ped personally and independently reviewed the scan and discussed results with the patient today.  The scan showed ***.  Dr. Arbutus Ped recommends ***  I will arrange for repeat EKG today to monitor for QT interval to ensure she is able to resume her tagrisso.  Dr. Arbutus Ped recommends repeat imaging in *** weeks for close interval follow up.   ***effusion    We will see her back for a follow up visit in *** with repeat lab work.   Due to her anemia, she had additional iron studies performed.    The patient was advised to call immediately if she has any concerning symptoms in the interval. The patient voices understanding of current disease status and treatment options and is in agreement with the current care plan. All questions were answered. The patient knows to call the clinic with any problems, questions or concerns. We can certainly see the patient much sooner if necessary   No orders of the defined types were placed in this encounter.    I spent {CHL ONC TIME VISIT - NWGNF:[id number]} counseling the patient face to face. The total time spent in the appointment was {CHL ONC TIME VISIT - IONGE:[id number]}.  Eaden Hettinger L Akshat Minehart, PA-C 02/17/23

## 2023-02-20 ENCOUNTER — Inpatient Hospital Stay: Payer: 59

## 2023-02-20 ENCOUNTER — Telehealth: Payer: Self-pay | Admitting: Physician Assistant

## 2023-02-20 ENCOUNTER — Inpatient Hospital Stay: Payer: 59 | Admitting: Physician Assistant

## 2023-02-21 ENCOUNTER — Other Ambulatory Visit: Payer: Self-pay

## 2023-02-21 ENCOUNTER — Ambulatory Visit: Payer: Medicare Other | Attending: Student | Admitting: Student

## 2023-02-21 ENCOUNTER — Encounter: Payer: Self-pay | Admitting: Internal Medicine

## 2023-02-21 NOTE — Progress Notes (Unsigned)
[facility] OFFICE PROGRESS NOTE  Anna Plum, MD [address] [place] [address]  DIAGNOSIS: Stage IV (T3, N2, M1 C) non-small cell lung cancer, adenocarcinoma presented with large right lower lobe lung mass in addition to subcarinal lymphadenopathy as well as bone metastasis diagnosed in April 2023   Molecular studies by foundation 1 showed EGFR T790M, Exon 19 deletion [id number]) AKT2- Aplification-equivocal ARID1A- [id number]*1 RICTOR- amplification-equivocal   PDL1 Expression 50%  PRIOR THERAPY: : Palliative radiotherapy to the metastatic T3 bone lesion under the care of Dr. Mitzi Hansen   CURRENT THERAPY: Tagrisso 80 mg p.o. daily.  First dose started May 22, 2021.  Status post 21 months of treatment.   INTERVAL HISTORY: Anna Finley 78 y.o. female returns to the clinic today for a follow-up visit accompanied by her granddaughter.  The patient was last seen in the clinic by Dr. Arbutus Ped on 08/23/2022.  She is currently undergoing targeted treatment with Tagrisso. She was lost to follow up since that time.   In the interval since she was last being seen, the patient was hospitalized at [facility] for a fall that resulted in  fracture. She underwent left hip arthroplasty on 10/23/22.   She was supposed to follow up in the clinic but was in serious pain and left before being seen and presented back at the [facility] ER. 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   The patient was hospitalized again at [facility] recently on 01/16/23-01/27/23. She presented with shortness of breath. She was found to have sepsis due to pneumonia. She also had prolonged QT and went into cardiopulmonary arrest during her hospitalization and was in the ICU. She also had moderate right pleural effusion. She was completed on antibiotics. She is also seeing cardiology for crestor, lasix, and metoprolol. They recommended  outpatient echocardiogram.  Her tagrisso was held.  Her follow-up with cardiology scheduled for later this month. She denies palpitations.   The patient believes that she resumed Tagrisso approximately 1 week after discharge.  Since being discharged she is actually been feeling a lot better.  In general she is a very cachectic and thin appearing female but seems to be more alert today.  He does not have any specific complaints today except for some headaches. The patient has history of headaches.  Patient is not sure if she has any metal in her body and if she has any contraindications to MRIs.  It looks like her last MRI was in 2023.  She denies any fever, chills, night sweats, or unexplained weight loss.  She reports that her appetite is "real good".  She does not have any dyspnea on exertion unless she is exerting which is her baseline.  She denies any significant cough unless she is trying to clear her cough up phlegm.  She denies any hemoptysis or chest pain.  She denies any nausea, vomiting, diarrhea, or constipation.  She denies any rashes or skin changes.  She is here today for evaluation and to get back on track with her appointments.   MEDICAL HISTORY: Past Medical History:  Diagnosis Date   COPD (chronic obstructive pulmonary disease) with chronic bronchitis (HCC)    HTN (hypertension)    Pneumothorax     ALLERGIES:  has no known allergies.  MEDICATIONS:  Current Outpatient Medications  Medication Sig Dispense Refill   acetaminophen (TYLENOL) 500 MG tablet Take 500 mg by mouth in the  morning, at noon, and at bedtime.     alum & mag hydroxide-simeth (MAALOX/MYLANTA) 200-200-20 MG/5ML suspension Take 30 mLs by mouth every 4 (four) hours as needed for indigestion or heartburn.     arformoterol (BROVANA) 15 MCG/2ML NEBU Take 2 mLs (15 mcg total) by nebulization 2 (two) times daily.     aspirin EC 81 MG tablet Take 1 tablet (81 mg total) by mouth daily.     budesonide (PULMICORT) 0.5  MG/2ML nebulizer solution Take 2 mLs (0.5 mg total) by nebulization 2 (two) times daily for 5 days.     dapagliflozin propanediol (FARXIGA) 10 MG TABS tablet Take 1 tablet (10 mg total) by mouth daily.     furosemide (LASIX) 40 MG tablet Take 1 tablet (40 mg total) by mouth daily.     ipratropium-albuterol (DUONEB) 0.5-2.5 (3) MG/3ML SOLN Take 3 mLs by nebulization every 6 (six) hours as needed.     lidocaine (LIDODERM) 5 % Place 1 patch onto the skin daily. Remove & Discard patch within 12 hours or as directed by MD 30 patch 0   metoprolol succinate (TOPROL-XL) 25 MG 24 hr tablet Take 1 tablet (25 mg total) by mouth daily.     montelukast (SINGULAIR) 10 MG tablet Take 10 mg by mouth at bedtime.     Multiple Vitamin (MULTIVITAMIN WITH MINERALS) TABS tablet Take 1 tablet by mouth daily.     [Paused] osimertinib mesylate (TAGRISSO) 80 MG tablet Take 1 tablet (80 mg total) by mouth daily. 30 tablet 3   oxyCODONE (OXY IR/ROXICODONE) 5 MG immediate release tablet Take 1 tablet (5 mg total) by mouth every 6 (six) hours as needed for moderate pain (pain score 4-6), breakthrough pain or severe pain (pain score 7-10). 10 tablet 0   pantoprazole (PROTONIX) 40 MG tablet Take 1 tablet (40 mg total) by mouth daily.     polyethylene glycol (MIRALAX / GLYCOLAX) 17 g packet Take 17 g by mouth daily as needed for mild constipation or moderate constipation. Meds to bed, thanks     revefenacin (YUPELRI) 175 MCG/3ML nebulizer solution Take 3 mLs (175 mcg total) by nebulization daily.     rosuvastatin (CRESTOR) 20 MG tablet Take 1 tablet (20 mg total) by mouth daily.     No current facility-administered medications for this visit.    SURGICAL HISTORY:  Past Surgical History:  Procedure Laterality Date   BRONCHIAL NEEDLE ASPIRATION BIOPSY  04/20/2021   Procedure: BRONCHIAL NEEDLE ASPIRATION BIOPSIES;  Surgeon: Lorin Glass, MD;  Location: [facility] ENDOSCOPY;  Service: Endoscopy;;   BRONCHIAL WASHINGS  04/20/2021    Procedure: BRONCHIAL WASHINGS;  Surgeon: Lorin Glass, MD;  Location: [facility];  Service: Endoscopy;;   ENDOBRONCHIAL ULTRASOUND N/A 04/20/2021   Procedure: ENDOBRONCHIAL ULTRASOUND;  Surgeon: Lorin Glass, MD;  Location: [facility]OSCOPY;  Service: Endoscopy;  Laterality: N/A;   HIP ARTHROPLASTY     VIDEO BRONCHOSCOPY Left 04/20/2021   Procedure: VIDEO BRONCHOSCOPY WITH FLUORO;  Surgeon: Lorin Glass, MD;  Location: [facility]OSCOPY;  Service: Endoscopy;  Laterality: Left;  EBUS    REVIEW OF SYSTEMS:   Review of Systems  Constitutional: Negative for appetite change, chills, fatigue, fever and unexpected weight change.  HENT: Negative for mouth sores, nosebleeds, sore throat and trouble swallowing.   Eyes: Negative for eye problems and icterus.  Respiratory: Positive for baseline level of dyspnea on exertion. Negative for cough, hemoptysis, and wheezing.   Cardiovascular: Negative for chest pain and leg swelling.  Gastrointestinal:  Negative for abdominal pain, constipation, diarrhea, nausea and vomiting.  Genitourinary: Negative for bladder incontinence, difficulty urinating, dysuria, frequency and hematuria.   Musculoskeletal: Negative for back pain, gait problem, neck pain and neck stiffness.  Skin: Negative for itching and rash.  Neurological: Negative for dizziness, extremity weakness, gait problem, headaches, light-headedness and seizures.  Hematological: Negative for adenopathy. Does not bruise/bleed easily.  Psychiatric/Behavioral: Negative for confusion, depression and sleep disturbance. The patient is not nervous/anxious.     PHYSICAL EXAMINATION:  Blood pressure (!) 149/95, pulse (!) 102, temperature 98.2 F (36.8 C), temperature source Temporal, resp. rate 16, weight 88 lb 1.6 oz (40 kg), SpO2 100%.  ECOG PERFORMANCE STATUS: 2-3  Physical Exam  Constitutional: Oriented to person, place, and time and thin/cachetic appearing female, and in no distress.  HENT:  Head:  Normocephalic and atraumatic.  Mouth/Throat: Oropharynx is clear and moist. No oropharyngeal exudate.  Eyes: Conjunctivae are normal. Right eye exhibits no discharge. Left eye exhibits no discharge. No scleral icterus.  Neck: Normal range of motion. Neck supple.  Cardiovascular: Normal rate, regular rhythm, normal heart sounds and intact distal pulses.   Pulmonary/Chest: Effort normal. Quiet breath sounds in the right lung. No respiratory distress. No wheezes. No rales.  Abdominal: Soft. Bowel sounds are normal. Exhibits no distension and no mass. There is no tenderness.  Musculoskeletal: Normal range of motion. Exhibits no edema.  Lymphadenopathy:    No cervical adenopathy.  Neurological: Alert and oriented to person, place, and time. Exhibits muscle wasting. Examined in the wheelchair.  Skin: Skin is warm and dry. No rash noted. Not diaphoretic. No erythema. No pallor.  Psychiatric: Mood, memory and judgment normal.  Vitals reviewed.  LABORATORY DATA: Lab Results  Component Value Date   WBC 5.1 02/28/2023   HGB 10.6 (L) 02/28/2023   HCT 33.9 (L) 02/28/2023   MCV 85.4 02/28/2023   PLT 338 02/28/2023      Chemistry      Component Value Date/Time   NA 142 02/28/2023 1549   K 3.4 (L) 02/28/2023 1549   CL 103 02/28/2023 1549   CO2 32 02/28/2023 1549   BUN 11 02/28/2023 1549   CREATININE 0.71 02/28/2023 1549      Component Value Date/Time   CALCIUM 8.9 02/28/2023 1549   ALKPHOS 255 (H) 02/28/2023 1549   AST 15 02/28/2023 1549   ALT 10 02/28/2023 1549   BILITOT 0.3 02/28/2023 1549       RADIOGRAPHIC STUDIES:  No results found.    ASSESSMENT/PLAN:  This is a very pleasant 78 year old African-American female diagnosed with stage IV (T3, N2, M1 C) non-small cell lung cancer, adenocarcinoma.  She presented with a left lower lobe lung mass in addition to subcarinal adenopathy.  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  She was diagnosed  in April 2023.   She underwent palliative radiotherapy to the metastatic T3 bone lesion under the care of Dr. Mitzi Hansen   Her molecular studies show that she is positive for an EGFR mutation with deletion in exon 19 in addition to T790 and.  Her PD-L1 expression is 50 percent.   Therefore the patient is currently undergoing targeted treatment with Tagrisso 80 mg p.o. daily rather than immunotherapy.   Her first dose of treatment was on 05/22/21.   She was lost to follow up from August 2024 to January 2025. Her tagrisso has been on hold since she was hospitalized in late December/early January 2025 for sepsis due  to pneumonia. She had cardiopulmonary arrest while in the ED. Therefore her Edgar Frisk was on hold during her hospitalization but the patient believes she resumed this about a week after.   The patient was seen with Dr. Arbutus Ped today.  Dr. Arbutus Ped personally and independently reviewed the scan from her hospitalization which did not show progression of her lung cancer.  I will arrange for repeat EKG today to monitor for QT interval to ensure she can continue her Tagrisso. Her QcT interval is prolonged at 511 ms and 507 ms. Therefore, she will need to hold Tagrisso for 1 week until we can see her back to repeat EKG.   Dr. Arbutus Ped recommends labs and follow-up in 1 week. .  Will order a CT scan of the head with and without contrast to follow-up on her headaches.  The patient did have a MRI in 2023 but the patient is on sure if she has any metal in her body at this time.  Therefore to be on the safe side, we will order a CT of the head with and without contrast  The patient was advised to call immediately if she has any concerning symptoms in the interval. The patient voices understanding of current disease status and treatment options and is in agreement with the current care plan. All questions were answered. The patient knows to call the clinic with any problems, questions or concerns. We can  certainly see the patient much sooner if necessary   Orders Placed This Encounter  Procedures   CT HEAD W & WO CONTRAST ( )    Standing Status:   Future    Expected Date:   03/14/2023    Expiration Date:   02/28/2024    If indicated for the ordered procedure, I authorize the administration of contrast media per Radiology protocol:   Yes    Does the patient have a contrast media/X-ray dye allergy?:   No    Preferred imaging location?:   [facility]   CBC with Differential (Cancer Center Only)    Standing Status:   Future    Expected Date:   03/28/2023    Expiration Date:   02/28/2024   CMP (Cancer Center only)    Standing Status:   Future    Expected Date:   03/28/2023    Expiration Date:   02/28/2024   EKG 12-Lead     I spent {CHL ONC TIME VISIT - AVWUJ:[id number]} counseling the patient face to face. The total time spent in the appointment was {CHL ONC TIME VISIT - FAOZH:[id number]}.  Kayce Betty L Nader Boys, PA-C 02/28/23

## 2023-02-23 ENCOUNTER — Other Ambulatory Visit: Payer: Self-pay

## 2023-02-28 ENCOUNTER — Inpatient Hospital Stay: Payer: 59 | Admitting: Physician Assistant

## 2023-02-28 ENCOUNTER — Encounter: Payer: Self-pay | Admitting: Internal Medicine

## 2023-02-28 ENCOUNTER — Inpatient Hospital Stay: Payer: 59 | Attending: Physician Assistant

## 2023-02-28 VITALS — BP 149/95 | HR 102 | Temp 98.2°F | Resp 16 | Wt 88.1 lb

## 2023-02-28 DIAGNOSIS — C3490 Malignant neoplasm of unspecified part of unspecified bronchus or lung: Secondary | ICD-10-CM

## 2023-02-28 DIAGNOSIS — G44309 Post-traumatic headache, unspecified, not intractable: Secondary | ICD-10-CM

## 2023-02-28 DIAGNOSIS — S0990XS Unspecified injury of head, sequela: Secondary | ICD-10-CM | POA: Diagnosis not present

## 2023-02-28 DIAGNOSIS — C3431 Malignant neoplasm of lower lobe, right bronchus or lung: Secondary | ICD-10-CM | POA: Diagnosis present

## 2023-02-28 DIAGNOSIS — C7951 Secondary malignant neoplasm of bone: Secondary | ICD-10-CM | POA: Insufficient documentation

## 2023-02-28 DIAGNOSIS — R519 Headache, unspecified: Secondary | ICD-10-CM | POA: Insufficient documentation

## 2023-02-28 DIAGNOSIS — C7971 Secondary malignant neoplasm of right adrenal gland: Secondary | ICD-10-CM | POA: Diagnosis not present

## 2023-02-28 LAB — CBC WITH DIFFERENTIAL (CANCER CENTER ONLY)
Abs Immature Granulocytes: 0.01 10*3/uL (ref 0.00–0.07)
Basophils Absolute: 0 10*3/uL (ref 0.0–0.1)
Basophils Relative: 0 %
Eosinophils Absolute: 0.1 10*3/uL (ref 0.0–0.5)
Eosinophils Relative: 2 %
HCT: 33.9 % — ABNORMAL LOW (ref 36.0–46.0)
Hemoglobin: 10.6 g/dL — ABNORMAL LOW (ref 12.0–15.0)
Immature Granulocytes: 0 %
Lymphocytes Relative: 22 %
Lymphs Abs: 1.1 10*3/uL (ref 0.7–4.0)
MCH: 26.7 pg (ref 26.0–34.0)
MCHC: 31.3 g/dL (ref 30.0–36.0)
MCV: 85.4 fL (ref 80.0–100.0)
Monocytes Absolute: 0.4 10*3/uL (ref 0.1–1.0)
Monocytes Relative: 8 %
Neutro Abs: 3.5 10*3/uL (ref 1.7–7.7)
Neutrophils Relative %: 68 %
Platelet Count: 338 10*3/uL (ref 150–400)
RBC: 3.97 MIL/uL (ref 3.87–5.11)
RDW: 16.2 % — ABNORMAL HIGH (ref 11.5–15.5)
WBC Count: 5.1 10*3/uL (ref 4.0–10.5)
nRBC: 0 % (ref 0.0–0.2)

## 2023-02-28 LAB — CMP (CANCER CENTER ONLY)
ALT: 10 U/L (ref 0–44)
AST: 15 U/L (ref 15–41)
Albumin: 3.6 g/dL (ref 3.5–5.0)
Alkaline Phosphatase: 255 U/L — ABNORMAL HIGH (ref 38–126)
Anion gap: 7 (ref 5–15)
BUN: 11 mg/dL (ref 8–23)
CO2: 32 mmol/L (ref 22–32)
Calcium: 8.9 mg/dL (ref 8.9–10.3)
Chloride: 103 mmol/L (ref 98–111)
Creatinine: 0.71 mg/dL (ref 0.44–1.00)
GFR, Estimated: >60 mL/min (ref >60)
Glucose, Bld: 91 mg/dL (ref 70–99)
Potassium: 3.4 mmol/L — ABNORMAL LOW (ref 3.5–5.1)
Sodium: 142 mmol/L (ref 135–145)
Total Bilirubin: 0.3 mg/dL (ref 0.0–1.2)
Total Protein: 6.7 g/dL (ref 6.5–8.1)

## 2023-02-28 LAB — IRON AND IRON BINDING CAPACITY (CC-WL,HP ONLY)
Iron: 34 ug/dL (ref 28–170)
Saturation Ratios: 13 % (ref 10.4–31.8)
TIBC: 256 ug/dL (ref 250–450)
UIBC: 222 ug/dL (ref 148–442)

## 2023-02-28 LAB — SAMPLE TO BLOOD BANK

## 2023-02-28 LAB — FOLATE: Folate: 8 ng/mL (ref >5.9)

## 2023-02-28 LAB — VITAMIN B12: Vitamin B-12: 540 pg/mL (ref 180–914)

## 2023-03-01 ENCOUNTER — Encounter: Payer: Self-pay | Admitting: Internal Medicine

## 2023-03-01 ENCOUNTER — Other Ambulatory Visit (HOSPITAL_COMMUNITY): Payer: Self-pay

## 2023-03-01 ENCOUNTER — Other Ambulatory Visit: Payer: Self-pay

## 2023-03-01 LAB — FERRITIN: Ferritin: 213 ng/mL (ref 11–307)

## 2023-03-01 NOTE — Progress Notes (Signed)
Specialty Pharmacy Refill Coordination Note  Anna Finley is a 78 y.o. female contacted today regarding refills of specialty medication(s) Osimertinib Mesylate Edgar Frisk) Spoke with patient's daughter Malachi Bonds  Patient requested Delivery   Delivery date: 03/06/23   Verified address: [address]   Medication will be filled on 02.14.25.

## 2023-03-03 ENCOUNTER — Other Ambulatory Visit: Payer: Self-pay

## 2023-03-09 ENCOUNTER — Inpatient Hospital Stay: Payer: 59

## 2023-03-12 ENCOUNTER — Other Ambulatory Visit: Payer: Self-pay

## 2023-03-13 ENCOUNTER — Telehealth: Payer: Self-pay | Admitting: Physician Assistant

## 2023-03-15 ENCOUNTER — Encounter (HOSPITAL_BASED_OUTPATIENT_CLINIC_OR_DEPARTMENT_OTHER): Payer: Self-pay | Admitting: Cardiovascular Disease

## 2023-03-15 ENCOUNTER — Encounter (HOSPITAL_BASED_OUTPATIENT_CLINIC_OR_DEPARTMENT_OTHER): Payer: Self-pay

## 2023-03-15 ENCOUNTER — Ambulatory Visit (HOSPITAL_BASED_OUTPATIENT_CLINIC_OR_DEPARTMENT_OTHER): Payer: 59 | Admitting: Cardiovascular Disease

## 2023-03-15 NOTE — Progress Notes (Deleted)
  Cardiology Office Note:  .   Date:  03/15/2023  ID:  Anna Finley, DOB Feb 18, 1945, MRN [id number] PCP: [name], MD  [facility] Providers Cardiologist:  Chilton Si, MD { Click to update primary MD,subspecialty MD or APP then REFRESH:1}   History of Present Illness: .   Anna Finley is a 78 y.o. 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 She was diagnosed with right lower lobe pneumonia. In the ED she had a cardiopulmonary arrest. She was found to have a shockable rhythm and underwent CPR for an unclear duration of time.     Discussed the use of AI scribe software for clinical note transcription with the patient, who gave verbal consent to proceed.  History of Present Illness            ROS:  As per HPI  Studies Reviewed: .        *** Risk Assessment/Calculations:   {Does this patient have ATRIAL FIBRILLATION?:[phone]} No BP recorded.  {Refresh Note OR Click here to enter BP  :1}***       Physical Exam:   VS:  There were no vitals taken for this visit. , BMI There is no height or weight on file to calculate BMI. GENERAL:  Well appearing HEENT: Pupils equal round and reactive, fundi not visualized, oral mucosa unremarkable NECK:  No jugular venous distention, waveform within normal limits, carotid upstroke brisk and symmetric, no bruits, no thyromegaly LUNGS:  Clear to auscultation bilaterally HEART:  RRR.  PMI not displaced or sustained,S1 and S2 within normal limits, no S3, no S4, no clicks, no rubs, *** murmurs ABD:  Flat, positive bowel sounds normal in frequency in pitch, no bruits, no rebound, no guarding, no midline pulsatile mass, no hepatomegaly, no splenomegaly EXT:  2 plus pulses throughout, no edema, no cyanosis no clubbing SKIN:  No rashes no nodules NEURO:  Cranial nerves II  through XII grossly intact, motor grossly intact throughout PSYCH:  Cognitively intact, oriented to person place and time   ASSESSMENT AND PLAN: .   *** Assessment and Plan                 {Are you ordering a CV Procedure (e.g. stress test, cath, DCCV, TEE, etc)?   Press F2        :[id number]}  Dispo: ***  Signed, Chilton Si, MD

## 2023-03-17 ENCOUNTER — Inpatient Hospital Stay: Payer: 59

## 2023-03-18 ENCOUNTER — Ambulatory Visit (HOSPITAL_COMMUNITY): Payer: 59

## 2023-03-23 ENCOUNTER — Encounter: Payer: Self-pay | Admitting: Internal Medicine

## 2023-03-24 NOTE — Progress Notes (Unsigned)
 [facility] OFFICE PROGRESS NOTE  Anna Plum, MD [address] [place] [address]  DIAGNOSIS: Stage IV (T3, N2, M1 C) non-small cell lung cancer, adenocarcinoma presented with large right lower lobe lung mass in addition to subcarinal lymphadenopathy as well as bone metastasis diagnosed in April 2023   Molecular studies by foundation 1 showed EGFR T790M, Exon 19 deletion [phone]) AKT2- Aplification-equivocal ARID1A- [id number]*1 RICTOR- amplification-equivocal   PDL1 Expression 50%  PRIOR THERAPY: Palliative radiotherapy to the metastatic T3 bone lesion under the care of Dr. Mitzi Hansen   CURRENT THERAPY: Tagrisso 80 mg p.o. daily.  First dose started May 22, 2021.  Status post 21 months of treatment. This has been on hold since 02/28/23  INTERVAL HISTORY: Anna Finley 78 y.o. female returns to the clinic today for a follow-up visit accompanied by her granddaughter.  The patient was last seen in the clinic by Dr. Arbutus Ped and myself on 02/28/23. The patient relies on her family for transportation and is not compliant with appointments.   When she was last seen, she was re-establishing after being lost to follow up for several months due to hospitalizations for fractures, aseptic necrosis, and sepsis.   She looked well despite being frail and cachetic at baseline at her last appointment. Her EKG showed prolonged Qtc. Therefore, her Edgar Frisk was held. She was supposed to follow up 1 week later with repeat EKG in the office but she did not show up to her appointment.   In the interval since being seen, she presented to the ER at [facility] for shortness of breath. Her BNP was >1000. She also had right pleural effusion . She was admitted for fluid overload. She is on lasix. Her repeat EF on echo was 20-25%. Of note, her echo from January 2025 showed EF of 30-35%. She has a follow up with cardiology on 04/06/23. She also was treated with augmentin,  prednisone, and tessalon a few days prior for possible respiratory infection.   Since being seen, she believes she may have accidentally taken a few doses of Tagrisso but overall had not been taking it.   At her last appointment, she was endorsing headaches. She had repeat CT of the head which did not show any metastatic disease.    She denies any fever, chills, night sweats, or unexplained weight loss. She does not have any dyspnea on exertion unless she is exerting which is her baseline.  She denies any significant cough unless she is trying to clear her cough up phlegm.  She denies any hemoptysis or chest pain.  She denies any nausea, vomiting, diarrhea, or constipation.  She denies any rashes or skin changes.  She is here today for evaluation and repeat blood work/EKG   MEDICAL HISTORY: Past Medical History:  Diagnosis Date   COPD (chronic obstructive pulmonary disease) with chronic bronchitis (HCC)    HFrEF (heart failure with reduced ejection fraction) (HCC) 01/23/2023   HTN (hypertension)    Pneumothorax     ALLERGIES:  has no known allergies.  MEDICATIONS:  Current Outpatient Medications  Medication Sig Dispense Refill   acetaminophen (TYLENOL) 500 MG tablet Take 500 mg by mouth in the morning, at noon, and at bedtime.     alum & mag hydroxide-simeth (MAALOX/MYLANTA) 200-200-20 MG/5ML suspension Take 30 mLs by mouth every 4 (four) hours as needed for indigestion or heartburn.     arformoterol (BROVANA) 15 MCG/2ML NEBU Take 2 mLs (15 mcg total) by nebulization 2 (  two) times daily.     aspirin EC 81 MG tablet Take 1 tablet (81 mg total) by mouth daily.     budesonide (PULMICORT) 0.5 MG/2ML nebulizer solution Take 2 mLs (0.5 mg total) by nebulization 2 (two) times daily for 5 days.     dapagliflozin propanediol (FARXIGA) 10 MG TABS tablet Take 1 tablet (10 mg total) by mouth daily.     furosemide (LASIX) 40 MG tablet Take 1 tablet (40 mg total) by mouth daily.      ipratropium-albuterol (DUONEB) 0.5-2.5 (3) MG/3ML SOLN Take 3 mLs by nebulization every 6 (six) hours as needed.     lidocaine (LIDODERM) 5 % Place 1 patch onto the skin daily. Remove & Discard patch within 12 hours or as directed by MD 30 patch 0   metoprolol succinate (TOPROL-XL) 25 MG 24 hr tablet Take 1 tablet (25 mg total) by mouth daily.     montelukast (SINGULAIR) 10 MG tablet Take 10 mg by mouth at bedtime.     Multiple Vitamin (MULTIVITAMIN WITH MINERALS) TABS tablet Take 1 tablet by mouth daily.     [Paused] osimertinib mesylate (TAGRISSO) 80 MG tablet Take 1 tablet (80 mg total) by mouth daily. 30 tablet 3   oxyCODONE (OXY IR/ROXICODONE) 5 MG immediate release tablet Take 1 tablet (5 mg total) by mouth every 6 (six) hours as needed for moderate pain (pain score 4-6), breakthrough pain or severe pain (pain score 7-10). 10 tablet 0   pantoprazole (PROTONIX) 40 MG tablet Take 1 tablet (40 mg total) by mouth daily.     polyethylene glycol (MIRALAX / GLYCOLAX) 17 g packet Take 17 g by mouth daily as needed for mild constipation or moderate constipation. Meds to bed, thanks     revefenacin (YUPELRI) 175 MCG/3ML nebulizer solution Take 3 mLs (175 mcg total) by nebulization daily.     rosuvastatin (CRESTOR) 20 MG tablet Take 1 tablet (20 mg total) by mouth daily.     No current facility-administered medications for this visit.    SURGICAL HISTORY:  Past Surgical History:  Procedure Laterality Date   BRONCHIAL NEEDLE ASPIRATION BIOPSY  04/20/2021   Procedure: BRONCHIAL NEEDLE ASPIRATION BIOPSIES;  Surgeon: Lorin Glass, MD;  Location: [facility];  Service: Endoscopy;;   BRONCHIAL WASHINGS  04/20/2021   Procedure: BRONCHIAL WASHINGS;  Surgeon: Lorin Glass, MD;  Location: [facility];  Service: Endoscopy;;   ENDOBRONCHIAL ULTRASOUND N/A 04/20/2021   Procedure: ENDOBRONCHIAL ULTRASOUND;  Surgeon: Lorin Glass, MD;  Location: [facility];  Service: Endoscopy;  Laterality: N/A;   HIP  ARTHROPLASTY     VIDEO BRONCHOSCOPY Left 04/20/2021   Procedure: VIDEO BRONCHOSCOPY WITH FLUORO;  Surgeon: Lorin Glass, MD;  Location: [facility];  Service: Endoscopy;  Laterality: Left;  EBUS    REVIEW OF SYSTEMS:   Review of Systems  Constitutional: Baseline fatigue and generalized weakness. Negative for appetite change, chills, fever and unexpected weight change.  HENT: Negative for mouth sores, nosebleeds, sore throat and trouble swallowing.   Eyes: Negative for eye problems and icterus.  Respiratory: Positive for baseline level of dyspnea on exertion. Negative for cough, hemoptysis, and wheezing.   Cardiovascular: Negative for chest pain and leg swelling.  Gastrointestinal: Negative for abdominal pain, constipation, diarrhea, nausea and vomiting.  Genitourinary: Negative for bladder incontinence, difficulty urinating, dysuria, frequency and hematuria.   Musculoskeletal: Negative for back pain, gait problem, neck pain and neck stiffness.  Skin: Negative for itching and rash.  Neurological: Negative for dizziness,  extremity weakness, gait problem, headaches, light-headedness and seizures.  Hematological: Negative for adenopathy. Does not bruise/bleed easily.  Psychiatric/Behavioral: Negative for confusion, depression and sleep disturbance. The patient is not nervous/anxious.     PHYSICAL EXAMINATION:  There were no vitals taken for this visit.  ECOG PERFORMANCE STATUS: 2-3  Physical Exam  Constitutional: Oriented to person, place, and time and thin/cachetic appearing female, and in no distress.  HENT:  Head: Normocephalic and atraumatic.  Mouth/Throat: Oropharynx is clear and moist. No oropharyngeal exudate.  Eyes: Conjunctivae are normal. Right eye exhibits no discharge. Left eye exhibits no discharge. No scleral icterus.  Neck: Normal range of motion. Neck supple.  Cardiovascular: Normal rate, regular rhythm, normal heart sounds and intact distal pulses.   Pulmonary/Chest:  Effort normal. Quiet breath sounds bilaterally. No respiratory distress. No wheezes. No rales.  Abdominal: Soft. Bowel sounds are normal. Exhibits no distension and no mass. There is no tenderness.  Musculoskeletal: Normal range of motion. Exhibits no edema.  Lymphadenopathy:    No cervical adenopathy.  Neurological: Alert and oriented to person, place, and time. Exhibits muscle wasting. Examined in the wheelchair.  Skin: Skin is warm and dry. No rash noted. Not diaphoretic. No erythema. No pallor.  Psychiatric: Mood, memory and judgment normal.  Vitals reviewed.    LABORATORY DATA: Lab Results  Component Value Date   WBC 5.1 02/28/2023   HGB 10.6 (L) 02/28/2023   HCT 33.9 (L) 02/28/2023   MCV 85.4 02/28/2023   PLT 338 02/28/2023      Chemistry      Component Value Date/Time   NA 142 02/28/2023 1549   K 3.4 (L) 02/28/2023 1549   CL 103 02/28/2023 1549   CO2 32 02/28/2023 1549   BUN 11 02/28/2023 1549   CREATININE 0.71 02/28/2023 1549      Component Value Date/Time   CALCIUM 8.9 02/28/2023 1549   ALKPHOS 255 (H) 02/28/2023 1549   AST 15 02/28/2023 1549   ALT 10 02/28/2023 1549   BILITOT 0.3 02/28/2023 1549       RADIOGRAPHIC STUDIES:  No results found.   ASSESSMENT/PLAN:  This is a very pleasant 78 year old African-American female diagnosed with stage IV (T3, N2, M1 C) non-small cell lung cancer, adenocarcinoma.  She presented with a left lower lobe lung mass in addition to subcarinal adenopathy.  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  She was diagnosed in April 2023.    She underwent palliative radiotherapy to the metastatic T3 bone lesion under the care of Dr. Mitzi Hansen   Her molecular studies show that she is positive for an EGFR mutation with deletion in exon 19 in addition to T790 and.  Her PD-L1 expression is 50 percent.   Therefore the patient is currently undergoing targeted treatment with Tagrisso 80 mg p.o. daily  rather than immunotherapy.   Her first dose of treatment was on 05/22/21.    She was lost to follow up from August 2024 to January 2025. Her tagrisso has been on hold since she was hospitalized in late December/early January 2025 for sepsis due to pneumonia. She had cardiopulmonary arrest while in the ED. Therefore her Edgar Frisk was on hold during her hospitalization but the patient believes she resumed this about a week after.   Then she was seen on 2/11. Her EKG showed prolonged QT. Therefore, her tagrisso was on hold for 1 week and repeat EKG was ordered. She no showed the follow up EKG appointment.  The patient was seen with Dr. Arbutus Ped today. She had a repeat EKG which shows continued prolonged QT.  Dr. Arbutus Ped recommends continuing to hold her Tagrisso until she can be evaluated by cardiology on 04/06/2023.  We would not recommend resuming her Tagrisso at this time we asked that she discuss with them removing any other QT prolonging medications in addition to her evaluation for heart failure.  With her T790M mutation she is not a candidate for Tarceva.   She will continue on observation at this time as her disease is stable until she can be evaluated for her cardiac concerns.  Will see her back for labs and a follow-up visit in 1 month and repeat EKG.   The patient was advised to call immediately if she has any concerning symptoms in the interval. The patient voices understanding of current disease status and treatment options and is in agreement with the current care plan. All questions were answered. The patient knows to call the clinic with any problems, questions or concerns. We can certainly see the patient much sooner if necessary  No orders of the defined types were placed in this encounter.    Anna Tyndall L Rossanna Spitzley, PA-C 03/24/23  ADDENDUM: Hematology/Oncology Attending:  I had to face encounter with the patient today.  I reviewed her records, lab and recommended her care  plan.  This is a very pleasant 78 years old African-American female diagnosed with a stage IV non-small cell lung cancer, adenocarcinoma in April 2023 with molecular studies positive for EGFR mutation with combined exon 19 deletion as well as T790M.  The patient also has PD-L1 expression of 50%.  She is status post palliative radiotherapy to metastatic disease at T3 bone lesion and then she started treatment with Tagrisso 80 mg p.o. daily since May 2013 status post 21 months of treatment.  Her treatment has been on hold for almost 3 months now secondary to QTc prolongation after a cardiorespiratory arrest in January 2025.  The patient continues to do well with no concerning complaints. Repeat EKG today showed persistent QTc prolongation. We will refer her to cardiology for further evaluation and recommendation regarding her cardiac abnormality.  If the patient is not a candidate for treatment with Tagrisso in the future, we may consider discussion of a combination of Amivantamab and Lazertinib which is another approved frontline treatment for patient with EGFR mutation. We will see the patient back for follow-up visit in 1 months for reevaluation and discussion of her treatment options based on the cardiology evaluation. She was advised to call immediately if she has any other concerning symptoms in the interval. The total time spent in the appointment was 30 minutes. Disclaimer: This note was dictated with voice recognition software. Similar sounding words can inadvertently be transcribed and may be missed upon review. Lajuana Matte, MD

## 2023-03-25 ENCOUNTER — Ambulatory Visit (HOSPITAL_COMMUNITY)
Admission: RE | Admit: 2023-03-25 | Discharge: 2023-03-25 | Disposition: A | Discharge status: Home / Self Care | Payer: 59 | Source: Ambulatory Visit | Attending: Physician Assistant | Admitting: Physician Assistant

## 2023-03-25 DIAGNOSIS — C3490 Malignant neoplasm of unspecified part of unspecified bronchus or lung: Secondary | ICD-10-CM | POA: Insufficient documentation

## 2023-03-25 DIAGNOSIS — R519 Headache, unspecified: Secondary | ICD-10-CM | POA: Diagnosis present

## 2023-03-25 MED ORDER — IOHEXOL 300 MG/ML  SOLN
100.0000 mL | Freq: Once | INTRAMUSCULAR | Status: AC | PRN
  Administered 2023-03-25: 100 mL via INTRAVENOUS

## 2023-03-28 ENCOUNTER — Other Ambulatory Visit (HOSPITAL_COMMUNITY): Payer: Self-pay

## 2023-03-30 ENCOUNTER — Inpatient Hospital Stay (HOSPITAL_BASED_OUTPATIENT_CLINIC_OR_DEPARTMENT_OTHER): Payer: Medicare Other | Admitting: Physician Assistant

## 2023-03-30 ENCOUNTER — Inpatient Hospital Stay: Payer: Medicare Other | Attending: Physician Assistant

## 2023-03-30 VITALS — BP 131/74 | HR 90 | Temp 97.9°F | Resp 16 | Ht 65.0 in | Wt 87.8 lb

## 2023-03-30 DIAGNOSIS — C3431 Malignant neoplasm of lower lobe, right bronchus or lung: Secondary | ICD-10-CM | POA: Insufficient documentation

## 2023-03-30 DIAGNOSIS — C7951 Secondary malignant neoplasm of bone: Secondary | ICD-10-CM | POA: Insufficient documentation

## 2023-03-30 DIAGNOSIS — C3490 Malignant neoplasm of unspecified part of unspecified bronchus or lung: Secondary | ICD-10-CM

## 2023-03-30 DIAGNOSIS — C7971 Secondary malignant neoplasm of right adrenal gland: Secondary | ICD-10-CM | POA: Diagnosis not present

## 2023-03-30 LAB — CMP (CANCER CENTER ONLY)
ALT: 7 U/L (ref 0–44)
AST: 13 U/L — ABNORMAL LOW (ref 15–41)
Albumin: 4.1 g/dL (ref 3.5–5.0)
Alkaline Phosphatase: 240 U/L — ABNORMAL HIGH (ref 38–126)
Anion gap: 8 (ref 5–15)
BUN: 16 mg/dL (ref 8–23)
CO2: 32 mmol/L (ref 22–32)
Calcium: 8.7 mg/dL — ABNORMAL LOW (ref 8.9–10.3)
Chloride: 100 mmol/L (ref 98–111)
Creatinine: 0.74 mg/dL (ref 0.44–1.00)
GFR, Estimated: >60 mL/min (ref >60)
Glucose, Bld: 92 mg/dL (ref 70–99)
Potassium: 3.3 mmol/L — ABNORMAL LOW (ref 3.5–5.1)
Sodium: 140 mmol/L (ref 135–145)
Total Bilirubin: 0.4 mg/dL (ref 0.0–1.2)
Total Protein: 7.3 g/dL (ref 6.5–8.1)

## 2023-03-30 LAB — CBC WITH DIFFERENTIAL (CANCER CENTER ONLY)
Abs Immature Granulocytes: 0.01 10*3/uL (ref 0.00–0.07)
Basophils Absolute: 0 10*3/uL (ref 0.0–0.1)
Basophils Relative: 0 %
Eosinophils Absolute: 0 10*3/uL (ref 0.0–0.5)
Eosinophils Relative: 1 %
HCT: 36.1 % (ref 36.0–46.0)
Hemoglobin: 11.3 g/dL — ABNORMAL LOW (ref 12.0–15.0)
Immature Granulocytes: 0 %
Lymphocytes Relative: 18 %
Lymphs Abs: 1.2 10*3/uL (ref 0.7–4.0)
MCH: 27.3 pg (ref 26.0–34.0)
MCHC: 31.3 g/dL (ref 30.0–36.0)
MCV: 87.2 fL (ref 80.0–100.0)
Monocytes Absolute: 0.6 10*3/uL (ref 0.1–1.0)
Monocytes Relative: 8 %
Neutro Abs: 4.9 10*3/uL (ref 1.7–7.7)
Neutrophils Relative %: 73 %
Platelet Count: 356 10*3/uL (ref 150–400)
RBC: 4.14 MIL/uL (ref 3.87–5.11)
RDW: 16.3 % — ABNORMAL HIGH (ref 11.5–15.5)
WBC Count: 6.8 10*3/uL (ref 4.0–10.5)
nRBC: 0 % (ref 0.0–0.2)

## 2023-03-31 ENCOUNTER — Encounter: Payer: Self-pay | Admitting: Internal Medicine

## 2023-03-31 ENCOUNTER — Other Ambulatory Visit: Payer: Self-pay

## 2023-03-31 ENCOUNTER — Other Ambulatory Visit (HOSPITAL_COMMUNITY): Payer: Self-pay

## 2023-04-03 ENCOUNTER — Other Ambulatory Visit: Payer: Self-pay

## 2023-04-03 NOTE — Progress Notes (Signed)
 Specialty Pharmacy Refill Coordination Note  Anna Finley is a 78 y.o. female contacted today regarding refills of specialty medication(s) Osimertinib Mesylate Edgar Frisk)   Spoke with patient's daughter  Patient requested Delivery   Delivery date: 04/06/23   Verified address: [address]   Medication will be filled on 03.19.25.

## 2023-04-05 ENCOUNTER — Other Ambulatory Visit (HOSPITAL_COMMUNITY): Payer: Self-pay

## 2023-04-05 ENCOUNTER — Encounter: Payer: Self-pay | Admitting: Internal Medicine

## 2023-04-05 ENCOUNTER — Other Ambulatory Visit: Payer: Self-pay

## 2023-04-24 ENCOUNTER — Other Ambulatory Visit: Payer: Self-pay

## 2023-04-27 ENCOUNTER — Encounter: Payer: Self-pay | Admitting: Internal Medicine

## 2023-04-27 ENCOUNTER — Inpatient Hospital Stay

## 2023-04-27 ENCOUNTER — Inpatient Hospital Stay: Admitting: Internal Medicine

## 2023-04-28 ENCOUNTER — Other Ambulatory Visit: Payer: Self-pay

## 2023-04-28 ENCOUNTER — Telehealth: Payer: Self-pay | Admitting: Internal Medicine

## 2023-04-28 ENCOUNTER — Other Ambulatory Visit: Payer: Self-pay | Admitting: Physician Assistant

## 2023-04-28 NOTE — Telephone Encounter (Signed)
 On hold due to prolonged QT

## 2023-04-28 NOTE — Progress Notes (Signed)
 Refill request was denied, patients daughter was notified and will follow up with provider.

## 2023-04-28 NOTE — Progress Notes (Signed)
 Specialty Pharmacy Refill Coordination Note  Anna Finley is a 78 y.o. female contacted today regarding refills of specialty medication(s) Osimertinib Mesylate Edgar Frisk)   Patient requested Delivery   Delivery date: 05/03/23   Verified address: [address]   Medication will be filled on 05/02/23.

## 2023-04-28 NOTE — Telephone Encounter (Signed)
 Rescheduled appointment per the patients daughter leaving a voicemail. I left the patients daughter a voicemail with the appointment details.

## 2023-04-29 ENCOUNTER — Other Ambulatory Visit: Payer: Self-pay

## 2023-05-01 ENCOUNTER — Other Ambulatory Visit: Payer: Self-pay

## 2023-05-12 ENCOUNTER — Encounter: Payer: Self-pay | Admitting: Internal Medicine

## 2023-05-17 ENCOUNTER — Inpatient Hospital Stay (HOSPITAL_BASED_OUTPATIENT_CLINIC_OR_DEPARTMENT_OTHER): Admitting: Internal Medicine

## 2023-05-17 ENCOUNTER — Inpatient Hospital Stay: Attending: Physician Assistant

## 2023-05-17 VITALS — BP 125/59 | HR 88 | Temp 97.8°F | Resp 15 | Ht 65.0 in | Wt 92.2 lb

## 2023-05-17 DIAGNOSIS — C3490 Malignant neoplasm of unspecified part of unspecified bronchus or lung: Secondary | ICD-10-CM | POA: Diagnosis not present

## 2023-05-17 DIAGNOSIS — I110 Hypertensive heart disease with heart failure: Secondary | ICD-10-CM | POA: Diagnosis not present

## 2023-05-17 DIAGNOSIS — R9431 Abnormal electrocardiogram [ECG] [EKG]: Secondary | ICD-10-CM | POA: Insufficient documentation

## 2023-05-17 DIAGNOSIS — C7951 Secondary malignant neoplasm of bone: Secondary | ICD-10-CM | POA: Insufficient documentation

## 2023-05-17 DIAGNOSIS — C3431 Malignant neoplasm of lower lobe, right bronchus or lung: Secondary | ICD-10-CM | POA: Diagnosis present

## 2023-05-17 LAB — CBC WITH DIFFERENTIAL (CANCER CENTER ONLY)
Abs Immature Granulocytes: 0.02 10*3/uL (ref 0.00–0.07)
Basophils Absolute: 0 10*3/uL (ref 0.0–0.1)
Basophils Relative: 0 %
Eosinophils Absolute: 0.1 10*3/uL (ref 0.0–0.5)
Eosinophils Relative: 2 %
HCT: 38.2 % (ref 36.0–46.0)
Hemoglobin: 12.5 g/dL (ref 12.0–15.0)
Immature Granulocytes: 0 %
Lymphocytes Relative: 17 %
Lymphs Abs: 1 10*3/uL (ref 0.7–4.0)
MCH: 28.2 pg (ref 26.0–34.0)
MCHC: 32.7 g/dL (ref 30.0–36.0)
MCV: 86.2 fL (ref 80.0–100.0)
Monocytes Absolute: 0.6 10*3/uL (ref 0.1–1.0)
Monocytes Relative: 10 %
Neutro Abs: 4.2 10*3/uL (ref 1.7–7.7)
Neutrophils Relative %: 71 %
Platelet Count: 395 10*3/uL (ref 150–400)
RBC: 4.43 MIL/uL (ref 3.87–5.11)
RDW: 15.2 % (ref 11.5–15.5)
WBC Count: 5.9 10*3/uL (ref 4.0–10.5)
nRBC: 0 % (ref 0.0–0.2)

## 2023-05-17 LAB — CMP (CANCER CENTER ONLY)
ALT: 9 U/L (ref 0–44)
AST: 14 U/L — ABNORMAL LOW (ref 15–41)
Albumin: 3.7 g/dL (ref 3.5–5.0)
Alkaline Phosphatase: 350 U/L — ABNORMAL HIGH (ref 38–126)
Anion gap: 8 (ref 5–15)
BUN: 13 mg/dL (ref 8–23)
CO2: 31 mmol/L (ref 22–32)
Calcium: 9 mg/dL (ref 8.9–10.3)
Chloride: 101 mmol/L (ref 98–111)
Creatinine: 0.86 mg/dL (ref 0.44–1.00)
GFR, Estimated: >60 mL/min (ref >60)
Glucose, Bld: 133 mg/dL — ABNORMAL HIGH (ref 70–99)
Potassium: 3 mmol/L — ABNORMAL LOW (ref 3.5–5.1)
Sodium: 140 mmol/L (ref 135–145)
Total Bilirubin: 0.3 mg/dL (ref 0.0–1.2)
Total Protein: 6.8 g/dL (ref 6.5–8.1)

## 2023-05-17 NOTE — Progress Notes (Signed)
 [facility] [phone]   [phone]  OFFICE PROGRESS NOTE  Tretha Fu, MD [address] [place] [address]  DIAGNOSIS: Stage IV (T3, N2, M1 C) non-small cell lung cancer, adenocarcinoma presented with large right lower lobe lung mass in addition to subcarinal lymphadenopathy as well as bone metastasis diagnosed in April 2023  Molecular studies by foundation 1 showed EGFR T790M, Exon 19 deletion ([id number]) AKT2- Aplification-equivocal ARID1A- [id number]*1 RICTOR- amplification-equivocal  PDL1 Expression 50%  PRIOR THERAPY: Palliative radiotherapy to the metastatic T3 bone lesion under the care of Dr. Jeryl Moris  CURRENT THERAPY: Tagrisso  80 mg p.o. daily.  First dose started May 22, 2021.  Status post 21 months of treatment.  Her treatment has been on hold since February 2025 secondary to QTc prolongation.  INTERVAL HISTORY: Anna Finley 78 y.o. female returns to the clinic today for follow-up visit accompanied by her granddaughter.  Her daughter was available by phone during the visit. Discussed the use of AI scribe software for clinical note transcription with the patient, who gave verbal consent to proceed.  History of Present Illness   Anna Finley is a 78 year old female with stage four nonsmall cell lung cancer who presents for evaluation and repeat blood work.  She has stage four nonsmall cell lung cancer, adenocarcinoma, with an EGFR mutation including a deletion in exon nineteen and a T790M mutation, as well as a PD-L1 expression of fifty percent. Initially, she received palliative radiotherapy for a metastatic T3 bone lesion and began systemic targeted therapy with Tagrisso  at a dose of 80 mg daily in May 2023. Her Tagrisso  treatment was put on hold in February 2025 due to a prolonged QT interval identified on an EKG.  She feels 'pretty good' overall, with no issues of chest pain, hemoptysis, nausea,  vomiting, or diarrhea. She mentions a slight headache, which was previously discussed with her healthcare provider. Her breathing is described as good, and she performs breathing exercises every morning and night.  Her medication regimen was altered due to congestive heart failure, leading to the discontinuation of Tagrisso . She is currently taking a new medication, though the specific name was not confirmed during the visit. She has a history of heart issues and sees a cardiologist. Her granddaughter mentioned that her heart doctor noted an issue with the electrical flow in her heart, which contributed to the decision to stop Tagrisso .      MEDICAL HISTORY: Past Medical History:  Diagnosis Date   COPD (chronic obstructive pulmonary disease) with chronic bronchitis (HCC)    HFrEF (heart failure with reduced ejection fraction) (HCC) 01/23/2023   HTN (hypertension)    Pneumothorax     ALLERGIES:  has no known allergies.  MEDICATIONS:  Current Outpatient Medications  Medication Sig Dispense Refill   acetaminophen  (TYLENOL ) 500 MG tablet Take 500 mg by mouth in the morning, at noon, and at bedtime.     alum & mag hydroxide-simeth (MAALOX/MYLANTA) 200-200-20 MG/5ML suspension Take 30 mLs by mouth every 4 (four) hours as needed for indigestion or heartburn.     arformoterol  (BROVANA ) 15 MCG/2ML NEBU Take 2 mLs (15 mcg total) by nebulization 2 (two) times daily.     aspirin  EC 81 MG tablet Take 1 tablet (81 mg total) by mouth daily.     budesonide  (PULMICORT ) 0.5 MG/2ML nebulizer solution Take 2 mLs (0.5 mg total) by nebulization 2 (two) times daily for 5 days.  dapagliflozin  propanediol (FARXIGA ) 10 MG TABS tablet Take 1 tablet (10 mg total) by mouth daily.     furosemide  (LASIX ) 40 MG tablet Take 1 tablet (40 mg total) by mouth daily.     ipratropium-albuterol  (DUONEB) 0.5-2.5 (3) MG/3ML SOLN Take 3 mLs by nebulization every 6 (six) hours as needed.     lidocaine  (LIDODERM ) 5 % Place 1 patch  onto the skin daily. Remove & Discard patch within 12 hours or as directed by MD 30 patch 0   metoprolol  succinate (TOPROL -XL) 25 MG 24 hr tablet Take 1 tablet (25 mg total) by mouth daily.     montelukast  (SINGULAIR ) 10 MG tablet Take 10 mg by mouth at bedtime.     Multiple Vitamin (MULTIVITAMIN WITH MINERALS) TABS tablet Take 1 tablet by mouth daily.     omeprazole (PRILOSEC) 20 MG capsule Take 20 mg by mouth 2 (two) times daily.     [Paused] osimertinib  mesylate (TAGRISSO ) 80 MG tablet Take 1 tablet (80 mg total) by mouth daily. 30 tablet 3   oxyCODONE  (OXY IR/ROXICODONE ) 5 MG immediate release tablet Take 1 tablet (5 mg total) by mouth every 6 (six) hours as needed for moderate pain (pain score 4-6), breakthrough pain or severe pain (pain score 7-10). 10 tablet 0   pantoprazole  (PROTONIX ) 40 MG tablet Take 1 tablet (40 mg total) by mouth daily. (Patient not taking: Reported on 03/30/2023)     polyethylene glycol (MIRALAX  / GLYCOLAX ) 17 g packet Take 17 g by mouth daily as needed for mild constipation or moderate constipation. Meds to bed, thanks     revefenacin  (YUPELRI ) 175 MCG/3ML nebulizer solution Take 3 mLs (175 mcg total) by nebulization daily.     rosuvastatin  (CRESTOR ) 20 MG tablet Take 1 tablet (20 mg total) by mouth daily.     No current facility-administered medications for this visit.    SURGICAL HISTORY:  Past Surgical History:  Procedure Laterality Date   BRONCHIAL NEEDLE ASPIRATION BIOPSY  04/20/2021   Procedure: BRONCHIAL NEEDLE ASPIRATION BIOPSIES;  Surgeon: Josiah Nigh, MD;  Location: [facility];  Service: Endoscopy;;   BRONCHIAL WASHINGS  04/20/2021   Procedure: BRONCHIAL WASHINGS;  Surgeon: Josiah Nigh, MD;  Location: [facility];  Service: Endoscopy;;   ENDOBRONCHIAL ULTRASOUND N/A 04/20/2021   Procedure: ENDOBRONCHIAL ULTRASOUND;  Surgeon: Josiah Nigh, MD;  Location: [facility];  Service: Endoscopy;  Laterality: N/A;   HIP ARTHROPLASTY     VIDEO  BRONCHOSCOPY Left 04/20/2021   Procedure: VIDEO BRONCHOSCOPY WITH FLUORO;  Surgeon: Josiah Nigh, MD;  Location: [facility];  Service: Endoscopy;  Laterality: Left;  EBUS    REVIEW OF SYSTEMS:  Constitutional: positive for fatigue Eyes: negative Ears, nose, mouth, throat, and face: negative Respiratory: negative Cardiovascular: negative Gastrointestinal: negative Genitourinary:negative Integument/breast: negative Hematologic/lymphatic: negative Musculoskeletal:negative Neurological: negative Behavioral/Psych: negative Endocrine: negative Allergic/Immunologic: negative   PHYSICAL EXAMINATION: General appearance: alert, cooperative, appears older than stated age, cachectic, and fatigued Head: Normocephalic, without obvious abnormality, atraumatic Neck: no adenopathy, no JVD, supple, symmetrical, trachea midline, and thyroid  not enlarged, symmetric, no tenderness/mass/nodules Lymph nodes: Cervical, supraclavicular, and axillary nodes normal. Resp: clear to auscultation bilaterally Back: symmetric, no curvature. ROM normal. No CVA tenderness. Cardio: regular rate and rhythm, S1, S2 normal, no murmur, click, rub or gallop GI: soft, non-tender; bowel sounds normal; no masses,  no organomegaly Extremities: extremities normal, atraumatic, no cyanosis or edema Neurologic: Alert and oriented X 3, normal strength and tone. Normal symmetric reflexes. Normal coordination and gait  ECOG PERFORMANCE STATUS: 1 - Symptomatic but completely ambulatory  Blood pressure (!) 125/59, pulse 88, temperature 97.8 F (36.6 C), temperature source Temporal, resp. rate 15, height 5\' 5"  (1.651 m), weight 92 lb 3.2 oz (41.8 kg), SpO2 97%.  LABORATORY DATA: Lab Results  Component Value Date   WBC 5.9 05/17/2023   HGB 12.5 05/17/2023   HCT 38.2 05/17/2023   MCV 86.2 05/17/2023   PLT 395 05/17/2023      Chemistry      Component Value Date/Time   NA 140 05/17/2023 1004   K 3.0 (L) 05/17/2023 1004    CL 101 05/17/2023 1004   CO2 31 05/17/2023 1004   BUN 13 05/17/2023 1004   CREATININE 0.86 05/17/2023 1004      Component Value Date/Time   CALCIUM  9.0 05/17/2023 1004   ALKPHOS 350 (H) 05/17/2023 1004   AST 14 (L) 05/17/2023 1004   ALT 9 05/17/2023 1004   BILITOT 0.3 05/17/2023 1004       RADIOGRAPHIC STUDIES: No results found.  ASSESSMENT AND PLAN: This is a 78 years old African-American female diagnosed with Stage IV (T3, N2, M1 C) non-small cell lung cancer, adenocarcinoma presented with large right lower lobe lung mass in addition to subcarinal lymphadenopathy as well as right adrenal gland and bone metastasis diagnosed in April 2023.   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   PD-L1 expression of 50%. The patient started her treatment with Tagrisso  in May 2023.  She is status post 21 months of treatment.  Her treatment has been on hold since February 2025 secondary to QTc prolongation as well as cardiac arrest at that time. Assessment and Plan    Stage 4 non-small cell lung cancer Stage 4 non-small cell lung cancer with adenocarcinoma subtype, positive for EGFR mutation with deletion in exon 19 and T790M mutation, and PD-L1 expression of 50%. Currently not on any treatment due to prolonged QT interval. Tagrisso  was held since February due to prolonged QT interval. Tarceva is not effective due to T790M mutation. - Perform EKG today to assess QT interval - Order CT scan of chest, abdomen, and pelvis for next week - Review scan results in 10 days to determine further treatment options - Consider resuming Tagrisso  if QT interval normalizes - Discuss alternative treatment options if QT interval remains prolonged  Prolonged QT interval Prolonged QT interval led to the discontinuation of Tagrisso . - Perform EKG today to assess QT interval  Congestive heart failure Congestive heart failure contributed to the decision to hold  Tagrisso  due to prolonged QT interval.   The patient was advised to call immediately if there is any other concerning symptoms in the interval. All questions were answered. The patient knows to call the clinic with any problems, questions or concerns. We can certainly see the patient much sooner if necessary. The total time spent in the appointment was 30 minutes.  Disclaimer: This note was dictated with voice recognition software. Similar sounding words can inadvertently be transcribed and may not be corrected upon review.

## 2023-05-18 ENCOUNTER — Other Ambulatory Visit: Payer: Self-pay | Admitting: Physician Assistant

## 2023-05-18 ENCOUNTER — Telehealth: Payer: Self-pay

## 2023-05-18 DIAGNOSIS — E876 Hypokalemia: Secondary | ICD-10-CM

## 2023-05-18 MED ORDER — POTASSIUM CHLORIDE CRYS ER 20 MEQ PO TBCR: 20.0000 meq | EXTENDED_RELEASE_TABLET | Freq: Two times a day (BID) | ORAL | 0 refills | Status: DC

## 2023-05-18 NOTE — Telephone Encounter (Signed)
 Pt's daughter, Art Bigness, advised and voiced understanding

## 2023-05-18 NOTE — Telephone Encounter (Signed)
-----   Message from Cassandra L Heilingoetter sent at 05/18/2023  7:16 AM EDT ----- Aurora Blowers, are you with me today? Can you call her or her daughter/granddaughter and let them know I sent potassium to the pharmacy ----- Message ----- From: Interface, [facility] [place] Sent: 05/17/2023  10:17 AM EDT To: Leita Purdue Heilingoetter, PA-C

## 2023-05-20 ENCOUNTER — Other Ambulatory Visit: Payer: Self-pay

## 2023-05-22 ENCOUNTER — Encounter: Payer: Self-pay | Admitting: Physician Assistant

## 2023-05-23 ENCOUNTER — Other Ambulatory Visit: Payer: Self-pay

## 2023-05-23 ENCOUNTER — Other Ambulatory Visit: Payer: Self-pay | Admitting: Physician Assistant

## 2023-05-23 NOTE — Progress Notes (Signed)
 Specialty Pharmacy Refill Coordination Note  Anna Finley is a 78 y.o. female, patients daughter Art Bigness was  contacted today regarding refills of specialty medication(s) Osimertinib  Mesylate (TAGRISSO )   Patient requested Delivery   Delivery date: 05/26/23   Verified address: [address]   [place] [address]   Medication will be filled on 05/25/23.   This fill date is pending response to refill request from provider. Patient daughter was made aware and if they have not received fill by intended date they must follow up with pharmacy.

## 2023-05-25 ENCOUNTER — Encounter: Payer: Self-pay | Admitting: Internal Medicine

## 2023-05-25 ENCOUNTER — Other Ambulatory Visit: Payer: Self-pay

## 2023-05-25 ENCOUNTER — Other Ambulatory Visit (HOSPITAL_COMMUNITY): Payer: Self-pay

## 2023-05-25 NOTE — Progress Notes (Signed)
 Medication is on hold, re-timed next refill call appropriately.   Spoke to [place], patient's daughter and confirmed that patient is holding.  She also reports 2 full bottles on hand at this time.

## 2023-05-25 NOTE — Progress Notes (Signed)
 Medication has been on hold, called and spoke to patient's daughter, Art Bigness, and patient has been holding therapy and has 2 full bottles on hand, re-timing refill call appropriately.

## 2023-05-25 NOTE — Telephone Encounter (Signed)
 On hold due to prolonged QT

## 2023-05-27 ENCOUNTER — Encounter: Payer: Self-pay | Admitting: Internal Medicine

## 2023-05-30 ENCOUNTER — Ambulatory Visit (HOSPITAL_COMMUNITY)
Admission: RE | Admit: 2023-05-30 | Discharge: 2023-05-30 | Disposition: A | Discharge status: Home / Self Care | Source: Ambulatory Visit | Attending: Internal Medicine | Admitting: Internal Medicine

## 2023-05-30 ENCOUNTER — Other Ambulatory Visit: Payer: Self-pay | Admitting: Internal Medicine

## 2023-05-30 DIAGNOSIS — C3490 Malignant neoplasm of unspecified part of unspecified bronchus or lung: Secondary | ICD-10-CM

## 2023-05-30 MED ORDER — IOHEXOL 9 MG/ML PO SOLN
500.0000 mL | ORAL | Status: AC
  Administered 2023-05-30: 500 mL via ORAL

## 2023-05-30 MED ORDER — IOHEXOL 300 MG/ML  SOLN
100.0000 mL | Freq: Once | INTRAMUSCULAR | Status: AC | PRN
  Administered 2023-05-30: 80 mL via INTRAVENOUS

## 2023-05-30 MED ORDER — IOHEXOL 9 MG/ML PO SOLN
ORAL | Status: AC
  Filled 2023-05-30: qty 1000

## 2023-06-06 ENCOUNTER — Inpatient Hospital Stay: Attending: Physician Assistant | Admitting: Internal Medicine

## 2023-06-16 ENCOUNTER — Encounter: Payer: Self-pay | Admitting: Internal Medicine

## 2023-06-17 ENCOUNTER — Other Ambulatory Visit: Payer: Self-pay

## 2023-06-20 ENCOUNTER — Inpatient Hospital Stay: Attending: Physician Assistant | Admitting: Internal Medicine

## 2023-06-20 VITALS — BP 145/88 | HR 90 | Temp 98.0°F | Resp 18 | Wt 94.8 lb

## 2023-06-20 DIAGNOSIS — C7971 Secondary malignant neoplasm of right adrenal gland: Secondary | ICD-10-CM | POA: Diagnosis not present

## 2023-06-20 DIAGNOSIS — C3490 Malignant neoplasm of unspecified part of unspecified bronchus or lung: Secondary | ICD-10-CM | POA: Diagnosis not present

## 2023-06-20 DIAGNOSIS — C7951 Secondary malignant neoplasm of bone: Secondary | ICD-10-CM | POA: Diagnosis not present

## 2023-06-20 DIAGNOSIS — E876 Hypokalemia: Secondary | ICD-10-CM | POA: Diagnosis not present

## 2023-06-20 DIAGNOSIS — C3431 Malignant neoplasm of lower lobe, right bronchus or lung: Secondary | ICD-10-CM | POA: Diagnosis present

## 2023-06-20 DIAGNOSIS — R9431 Abnormal electrocardiogram [ECG] [EKG]: Secondary | ICD-10-CM | POA: Diagnosis not present

## 2023-06-20 NOTE — Progress Notes (Signed)
 [facility] Telephone:([phone]) [phone]   [phone]  OFFICE PROGRESS NOTE  Tretha Fu, MD [address] [place] [address]  DIAGNOSIS: Stage IV (T3, N2, M1 C) non-small cell lung cancer, adenocarcinoma presented with large right lower lobe lung mass in addition to subcarinal lymphadenopathy as well as bone metastasis diagnosed in April 2023  Molecular studies by foundation 1 showed EGFR T790M, Exon 19 deletion ([id number]) AKT2- Aplification-equivocal ARID1A- [id number]*1 RICTOR- amplification-equivocal  PDL1 Expression 50%  PRIOR THERAPY: Palliative radiotherapy to the metastatic T3 bone lesion under the care of Dr. Jeryl Moris  CURRENT THERAPY: Tagrisso  80 mg p.o. daily.  First dose started May 22, 2021.  Status post 21 months of treatment.  Her treatment has been on hold since February 2025 secondary to QTc prolongation.  INTERVAL HISTORY: Anna Finley 78 y.o. female returns to the clinic today for follow-up visit accompanied by her daughter.Discussed the use of AI scribe software for clinical note transcription with the patient, who gave verbal consent to proceed.  History of Present Illness   Anna Finley is a 78 year old female with stage four non-small cell lung cancer who presents for evaluation with repeat CT scan for restaging of her disease. She is accompanied by her daughter.  Diagnosed with stage four non-small cell lung cancer, adenocarcinoma subtype, in April 2023, characterized by positive EGFR with exon 19 deletion, 790M mutations, and PD-L1 expression of 50%. Initially treated with Tagrisso  20 mg PO daily starting in May 2023, but discontinued in February 2025 due to QT prolongation after cardiac arrest.  She feels 'pretty good' with only occasional slight headaches. No chest pain, breathing issues, nausea, vomiting, or diarrhea. Breathing is reported as good.  Under the care of a cardiologist in [facility], she  is on diuretics and potassium supplements, taking potassium twice a day as reported by her family. Her daughter assists with her medication management.  A recent scan of the chest, abdomen, and pelvis showed stable disease except for a slight increase in size of the right adrenal gland.         MEDICAL HISTORY: Past Medical History:  Diagnosis Date   COPD (chronic obstructive pulmonary disease) with chronic bronchitis (HCC)    HFrEF (heart failure with reduced ejection fraction) (HCC) 01/23/2023   HTN (hypertension)    Pneumothorax     ALLERGIES:  has no known allergies.  MEDICATIONS:  Current Outpatient Medications  Medication Sig Dispense Refill   acetaminophen  (TYLENOL ) 500 MG tablet Take 500 mg by mouth in the morning, at noon, and at bedtime.     alum & mag hydroxide-simeth (MAALOX/MYLANTA) 200-200-20 MG/5ML suspension Take 30 mLs by mouth every 4 (four) hours as needed for indigestion or heartburn.     arformoterol  (BROVANA ) 15 MCG/2ML NEBU Take 2 mLs (15 mcg total) by nebulization 2 (two) times daily.     aspirin  EC 81 MG tablet Take 1 tablet (81 mg total) by mouth daily.     budesonide  (PULMICORT ) 0.5 MG/2ML nebulizer solution Take 2 mLs (0.5 mg total) by nebulization 2 (two) times daily for 5 days.     dapagliflozin  propanediol (FARXIGA ) 10 MG TABS tablet Take 1 tablet (10 mg total) by mouth daily.     furosemide  (LASIX ) 40 MG tablet Take 1 tablet (40 mg total) by mouth daily.     ipratropium-albuterol  (DUONEB) 0.5-2.5 (3) MG/3ML SOLN Take 3 mLs by nebulization every 6 (six) hours as needed.  lidocaine  (LIDODERM ) 5 % Place 1 patch onto the skin daily. Remove & Discard patch within 12 hours or as directed by MD 30 patch 0   metoprolol  succinate (TOPROL -XL) 25 MG 24 hr tablet Take 1 tablet (25 mg total) by mouth daily.     montelukast  (SINGULAIR ) 10 MG tablet Take 10 mg by mouth at bedtime.     Multiple Vitamin (MULTIVITAMIN WITH MINERALS) TABS tablet Take 1 tablet by mouth  daily.     omeprazole (PRILOSEC) 20 MG capsule Take 20 mg by mouth 2 (two) times daily.     [Paused] osimertinib  mesylate (TAGRISSO ) 80 MG tablet Take 1 tablet (80 mg total) by mouth daily. 30 tablet 3   oxyCODONE  (OXY IR/ROXICODONE ) 5 MG immediate release tablet Take 1 tablet (5 mg total) by mouth every 6 (six) hours as needed for moderate pain (pain score 4-6), breakthrough pain or severe pain (pain score 7-10). 10 tablet 0   pantoprazole  (PROTONIX ) 40 MG tablet Take 1 tablet (40 mg total) by mouth daily. (Patient not taking: Reported on 03/30/2023)     polyethylene glycol (MIRALAX  / GLYCOLAX ) 17 g packet Take 17 g by mouth daily as needed for mild constipation or moderate constipation. Meds to bed, thanks     potassium chloride  SA (KLOR-CON  M) 20 MEQ tablet Take 1 tablet (20 mEq total) by mouth 2 (two) times daily. 16 tablet 0   revefenacin  (YUPELRI ) 175 MCG/3ML nebulizer solution Take 3 mLs (175 mcg total) by nebulization daily.     rosuvastatin  (CRESTOR ) 20 MG tablet Take 1 tablet (20 mg total) by mouth daily.     No current facility-administered medications for this visit.    SURGICAL HISTORY:  Past Surgical History:  Procedure Laterality Date   BRONCHIAL NEEDLE ASPIRATION BIOPSY  04/20/2021   Procedure: BRONCHIAL NEEDLE ASPIRATION BIOPSIES;  Surgeon: Josiah Nigh, MD;  Location: [facility];  Service: Endoscopy;;   BRONCHIAL WASHINGS  04/20/2021   Procedure: BRONCHIAL WASHINGS;  Surgeon: Josiah Nigh, MD;  Location: [facility];  Service: Endoscopy;;   ENDOBRONCHIAL ULTRASOUND N/A 04/20/2021   Procedure: ENDOBRONCHIAL ULTRASOUND;  Surgeon: Josiah Nigh, MD;  Location: [facility];  Service: Endoscopy;  Laterality: N/A;   HIP ARTHROPLASTY     VIDEO BRONCHOSCOPY Left 04/20/2021   Procedure: VIDEO BRONCHOSCOPY WITH FLUORO;  Surgeon: Josiah Nigh, MD;  Location: [facility];  Service: Endoscopy;  Laterality: Left;  EBUS    REVIEW OF SYSTEMS:  Constitutional: negative Eyes:  negative Ears, nose, mouth, throat, and face: negative Respiratory: negative Cardiovascular: negative Gastrointestinal: negative Genitourinary:negative Integument/breast: negative Hematologic/lymphatic: negative Musculoskeletal:positive for arthralgias and muscle weakness Neurological: negative Behavioral/Psych: negative Endocrine: negative Allergic/Immunologic: negative   PHYSICAL EXAMINATION: General appearance: alert, cooperative, appears stated age, and no distress Head: Normocephalic, without obvious abnormality, atraumatic Neck: no adenopathy, no JVD, supple, symmetrical, trachea midline, and thyroid  not enlarged, symmetric, no tenderness/mass/nodules Lymph nodes: Cervical, supraclavicular, and axillary nodes normal. Resp: clear to auscultation bilaterally Back: symmetric, no curvature. ROM normal. No CVA tenderness. Cardio: regular rate and rhythm, S1, S2 normal, no murmur, click, rub or gallop GI: soft, non-tender; bowel sounds normal; no masses,  no organomegaly Extremities: extremities normal, atraumatic, no cyanosis or edema Neurologic: Alert and oriented X 3, normal strength and tone. Normal symmetric reflexes. Normal coordination and gait  ECOG PERFORMANCE STATUS: 1 - Symptomatic but completely ambulatory  Blood pressure (!) 145/88, pulse 90, temperature 98 F (36.7 C), temperature source Temporal, resp. rate 18, weight 94 lb 12.8 oz (  43 kg), SpO2 100%.  LABORATORY DATA: Lab Results  Component Value Date   WBC 5.9 05/17/2023   HGB 12.5 05/17/2023   HCT 38.2 05/17/2023   MCV 86.2 05/17/2023   PLT 395 05/17/2023      Chemistry      Component Value Date/Time   NA 140 05/17/2023 1004   K 3.0 (L) 05/17/2023 1004   CL 101 05/17/2023 1004   CO2 31 05/17/2023 1004   BUN 13 05/17/2023 1004   CREATININE 0.86 05/17/2023 1004      Component Value Date/Time   CALCIUM  9.0 05/17/2023 1004   ALKPHOS 350 (H) 05/17/2023 1004   AST 14 (L) 05/17/2023 1004   ALT 9  05/17/2023 1004   BILITOT 0.3 05/17/2023 1004       RADIOGRAPHIC STUDIES: CT CHEST ABDOMEN PELVIS W CONTRAST Result Date: 05/30/2023 CLINICAL DATA:  Non-small cell lung cancer restaging * Tracking Code: BO * EXAM: CT CHEST, ABDOMEN, AND PELVIS WITH CONTRAST TECHNIQUE: Multidetector CT imaging of the chest, abdomen and pelvis was performed following the standard protocol during bolus administration of intravenous contrast. RADIATION DOSE REDUCTION: This exam was performed according to the departmental dose-optimization program which includes automated exposure control, adjustment of the mA and/or kV according to patient size and/or use of iterative reconstruction technique. CONTRAST:  80mL OMNIPAQUE  IOHEXOL  300 MG/ML SOLN additional oral enteric contrast COMPARISON:  CT chest, 03/15/2023, CT chest abdomen pelvis, 10/18/2022 FINDINGS: CT CHEST FINDINGS Cardiovascular: Aortic atherosclerosis. Normal heart size. Left and right coronary artery calcifications. No pericardial effusion. Mediastinum/Nodes: No enlarged mediastinal, hilar, or axillary lymph nodes. Moderate hiatal hernia. Thyroid  gland, trachea, and esophagus demonstrate no significant findings. Lungs/Pleura: Mild centrilobular emphysema. Diffuse bilateral bronchial wall thickening. When compared to restaging examination dated 10/18/2022, unchanged post treatment appearance of the infrahilar right lung with a treated nodule measuring 1.8 x 1.5 cm and adjacent radiation fibrosis (series 4, image 70). Small, chronic loculated right pleural effusion. Musculoskeletal: No chest wall abnormality. No acute osseous findings. CT ABDOMEN PELVIS FINDINGS Hepatobiliary: No solid liver abnormality is seen. Unchanged small cyst of the liver dome (series 2, image 67). No gallstones, gallbladder wall thickening, or biliary dilatation. Pancreas: Unremarkable. No pancreatic ductal dilatation or surrounding inflammatory changes. Spleen: Normal in size without significant  abnormality. Adrenals/Urinary Tract: When compared to both examination dated 03/15/2023 and 10/18/2022, interval enlargement of a right adrenal nodule, now measuring 1.9 x 1.4 cm, previously 1.6 x 0.9 cm when measured similarly (series 2, image 48). Normal left adrenal. Lobulated right renal cortical scarring. No calculi or hydronephrosis. Left kidney is normal, without renal calculi, solid lesion, or hydronephrosis. Similar diffuse urinary bladder wall thickening mucosal hyperenhancement (series 6, image 68). Stomach/Bowel: Stomach is within normal limits. Appendix appears normal. No evidence of bowel wall thickening, distention, or inflammatory changes. Vascular/Lymphatic: Aortic atherosclerosis. No enlarged abdominal or pelvic lymph nodes. Reproductive: Calcified uterine fibroids. Other: No abdominal wall hernia or abnormality. No ascites. Musculoskeletal: No acute osseous findings. Status post left hip total arthroplasty. Redemonstrated subacute pathologic fracture of the mid sternal body (series 6, image 79) as well as sclerotic vertebral metastases, notable for a wedge deformity of T4 status post vertebral cement augmentation (series 6, image 74). Multiple nonacute bilateral rib fractures. IMPRESSION: 1. Interval enlargement of a right adrenal nodule, consistent with an enlarging metastasis. 2. When compared to restaging examination dated 10/18/2022, unchanged post treatment appearance of the infrahilar right lung with a treated nodule measuring 1.8 x 1.5 cm and adjacent radiation fibrosis. 3.  Small, chronic loculated right pleural effusion significantly diminished in volume compared to more recent examination dated 03/15/2023. 4. Redemonstrated subacute pathologic fracture of the mid sternal body as well as sclerotic vertebral metastases, notable for a wedge deformity of T4 status post vertebral cement augmentation. 5. Similar diffuse urinary bladder wall thickening and mucosal hyperenhancement, consistent  with nonspecific infectious or inflammatory cystitis. 6. Emphysema and diffuse bilateral bronchial wall thickening. 7. Coronary artery disease. Aortic Atherosclerosis (ICD10-I70.0) and Emphysema (ICD10-J43.9). Electronically Signed   By: Fredricka Jenny M.D.   On: 05/30/2023 22:12    ASSESSMENT AND PLAN: This is a 78 years old African-American female diagnosed with Stage IV (T3, N2, M1 C) non-small cell lung cancer, adenocarcinoma presented with large right lower lobe lung mass in addition to subcarinal lymphadenopathy as well as right adrenal gland and bone metastasis diagnosed in April 2023.   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   PD-L1 expression of 50%. The patient started her treatment with Tagrisso  in May 2023.  She is status post 21 months of treatment.  Her treatment has been on hold since February 2025 secondary to QTc prolongation as well as cardiac arrest at that time. The patient has been in observation since that time. She had repeat CT scan of the chest, abdomen and pelvis performed recently.  I personally and independently reviewed the scan and discussed the result with the patient and her daughter.  Her scan showed no concerning finding for disease progression except for enlargement of the right adrenal gland metastasis. Assessment and Plan    Stage 4 non-small cell lung cancer Stage 4 non-small cell lung cancer, adenocarcinoma, with positive EGFR exon 19 deletion and 790M mutations, and PD-L1 expression of 50%. Previously treated with Tagrisso , which was discontinued due to QT prolongation. Recent CT scan shows well-managed disease except for slight enlargement of the right adrenal gland. Radiation therapy is considered for the adrenal gland as the only area of concern. - Refer to Dr. Lorri Rota for radiation therapy to the right adrenal gland. - Reassess in two months with a follow-up scan.  QT prolongation QT prolongation  secondary to Tagrisso , which has been discontinued. QT interval remains prolonged but improved. Potassium levels are crucial to prevent further cardiac complications. - Ensure potassium levels are maintained to prevent further QT prolongation.  Low potassium (hypokalemia) Hypokalemia likely secondary to diuretic use, requiring potassium supplementation. Current potassium supplementation may need adjustment based on recent prescription instructions. Adequate potassium levels are essential to prevent cardiac rhythm issues. - Ensure potassium supplementation is taken as prescribed by the primary care physician, twice daily if indicated. - Consider increasing potassium supplementation if levels remain low.   The patient was advised to call immediately if she has any other concerning symptoms in the interval. All questions were answered. The patient knows to call the clinic with any problems, questions or concerns. We can certainly see the patient much sooner if necessary. The total time spent in the appointment was 30 minutes.  Disclaimer: This note was dictated with voice recognition software. Similar sounding words can inadvertently be transcribed and may not be corrected upon review.

## 2023-06-22 ENCOUNTER — Other Ambulatory Visit: Payer: Self-pay

## 2023-06-25 ENCOUNTER — Other Ambulatory Visit: Payer: Self-pay

## 2023-06-29 ENCOUNTER — Ambulatory Visit

## 2023-06-29 ENCOUNTER — Ambulatory Visit
Admission: RE | Admit: 2023-06-29 | Discharge: 2023-06-29 | Disposition: A | Discharge status: Home / Self Care | Source: Ambulatory Visit | Attending: Radiation Oncology | Admitting: Radiation Oncology

## 2023-06-29 ENCOUNTER — Encounter: Payer: Self-pay | Admitting: Internal Medicine

## 2023-06-29 ENCOUNTER — Telehealth: Payer: Self-pay | Admitting: Radiation Oncology

## 2023-06-29 NOTE — Telephone Encounter (Signed)
 6/12 @ 3:00 pm left voicemail to r/s patient missed appt for today.

## 2023-06-29 NOTE — Progress Notes (Incomplete)
 Thoracic Location of Tumor / Histology: Right Lower Lobe Lung, subcarinal lymphadenopathy, right adrenal gland and bone mets.  Anna Finley was diagnosed with stage four non-small cell lung cancer, adenocarcinoma subtype, in April 2023.  She has been followed by Dr. Marguerita Shih.  Current systemic therapy is on hold.  She presents today to discuss radiation therapy for the adrenal metastasis.   CT CAP 05/30/2023: When compared to both examination dated 03/15/2023 and 10/18/2022, interval enlargement of a right adrenal nodule, now measuring 1.9 x 1.4 cm, previously 1.6 x 0.9 cm when measured similarly.  Past/Anticipated interventions by medical oncology, if any:  Dr. Marguerita Shih 06/20/2023 -Recent CT scan shows well-managed disease except for slight enlargement of the right adrenal gland. Radiation therapy is considered for the adrenal gland as the only area of concern. - Refer to Dr. Lorri Rota for radiation therapy to the right adrenal gland.   Tobacco/Marijuana/Snuff/ETOH use: {:18581}  Signs/Symptoms Weight changes, if any: {:18581} Respiratory complaints, if any: {:18581} Hemoptysis, if any: {:18581} Pain issues, if any:  {:18581} Bowel/Bladder complaints, if any: {:18581}  Nausea/Vomiting, if any: {:18581}   SAFETY ISSUES: Prior radiation? Right Lung and aThoracic 3 4/5-4/19/202 Pacemaker/ICD? {:18581}  Possible current pregnancy?{:18581} Is the patient on methotrexate? {:18581}  Current Complaints / other details:

## 2023-06-30 ENCOUNTER — Encounter: Payer: Self-pay | Admitting: Radiation Oncology

## 2023-07-05 ENCOUNTER — Ambulatory Visit
Admission: RE | Admit: 2023-07-05 | Discharge: 2023-07-05 | Disposition: A | Discharge status: Home / Self Care | Source: Ambulatory Visit | Attending: Radiation Oncology | Admitting: Radiation Oncology

## 2023-07-05 ENCOUNTER — Other Ambulatory Visit: Payer: Self-pay

## 2023-07-05 ENCOUNTER — Ambulatory Visit: Admission: RE | Admit: 2023-07-05 | Source: Ambulatory Visit

## 2023-07-05 DIAGNOSIS — C7971 Secondary malignant neoplasm of right adrenal gland: Secondary | ICD-10-CM

## 2023-07-05 DIAGNOSIS — C7970 Secondary malignant neoplasm of unspecified adrenal gland: Secondary | ICD-10-CM | POA: Insufficient documentation

## 2023-07-05 DIAGNOSIS — C3431 Malignant neoplasm of lower lobe, right bronchus or lung: Secondary | ICD-10-CM

## 2023-07-05 NOTE — Progress Notes (Signed)
 Radiation Oncology         [phone] ________________________________  Outpatient Re-Consultation - Conducted via telephone at patient request.  I spoke with the patient's daughter to conduct this consult visit via telephone since the patient's number listed is actually her daughters.    Name: Anna Finley        MRN: [id number]  Date of Service: 07/05/2023 DOB: 02/08/1945  WU:[id number], Anna Chapel, MD  Anna Simas, MD     REFERRING PHYSICIAN: Marlene Simas, MD   DIAGNOSIS: The primary encounter diagnosis was Malignant neoplasm of lower lobe of right lung [facility]). 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   HISTORY OF PRESENT ILLNESS: Anna Finley is a 78 y.o. female seen at the request of Dr. Marguerita Shih for a diagnosis of  Stage IV NSCLC, adenocarcinoma,  EGFR mutated of the RLL. She was originally diagnosed in the spring of 2023. She received palliative radiation in our clinic to the lung and T3 spine, and was started on Tagrisso  in May 2023. Tagrisso  has been on hold however since February 2025 due to prolonged QT intervals.   Recent imaging on 05/30/22 showed concerns for an increase in a right adrenal nodule measuring 1.9 cm, previously 1.6 cm in October 2024. Given this, attempts to contact her were made to discuss radiotherapy options.    PREVIOUS RADIATION THERAPY: Yes   04/21/2021 through 05/05/2021 Site Technique Total Dose (Gy) Dose per Fx (Gy) Completed Fx Beam Energies  Lung, Right: Lung_R 3D 30/30 3 10/10 6X, 10X, 15X  Thoracic Spine: Spine_T3 3D 30/30 3 10/10 10X, 15X     PAST MEDICAL HISTORY:  Past Medical History:  Diagnosis Date   COPD (chronic obstructive pulmonary disease) with chronic bronchitis (HCC)    HFrEF (heart failure with reduced ejection fraction) (HCC) 01/23/2023   HTN (hypertension)    Pneumothorax        PAST SURGICAL HISTORY: Past Surgical History:  Procedure  Laterality Date   BRONCHIAL NEEDLE ASPIRATION BIOPSY  04/20/2021   Procedure: BRONCHIAL NEEDLE ASPIRATION BIOPSIES;  Surgeon: Anna Nigh, MD;  Location: [facility];  Service: Endoscopy;;   BRONCHIAL WASHINGS  04/20/2021   Procedure: BRONCHIAL WASHINGS;  Surgeon: Anna Nigh, MD;  Location: [facility];  Service: Endoscopy;;   ENDOBRONCHIAL ULTRASOUND N/A 04/20/2021   Procedure: ENDOBRONCHIAL ULTRASOUND;  Surgeon: Anna Nigh, MD;  Location: [facility];  Service: Endoscopy;  Laterality: N/A;   HIP ARTHROPLASTY     VIDEO BRONCHOSCOPY Left 04/20/2021   Procedure: VIDEO BRONCHOSCOPY WITH FLUORO;  Surgeon: Anna Nigh, MD;  Location: [facility];  Service: Endoscopy;  Laterality: Left;  EBUS     FAMILY HISTORY:  Family History  Problem Relation Age of Onset   Cancer Father 23       stomach     SOCIAL HISTORY:  reports that she quit smoking about 12 years ago. Her smoking use included cigarettes. She started smoking about 65 years ago. She has a 26.5 pack-year smoking history. She quit smokeless tobacco use about 12 years ago. She reports that she does not currently use alcohol. She reports that she does not use drugs. The patient is widowed and lives in [place]. She is helped by her adult children with appointments and transportation for medical care.   ALLERGIES: Patient has no known allergies.   MEDICATIONS:  Current Outpatient Medications  Medication Sig Dispense Refill   acetaminophen  (TYLENOL ) 500 MG tablet  Take 500 mg by mouth in the morning, at noon, and at bedtime.     alum & mag hydroxide-simeth (MAALOX/MYLANTA) 200-200-20 MG/5ML suspension Take 30 mLs by mouth every 4 (four) hours as needed for indigestion or heartburn.     arformoterol  (BROVANA ) 15 MCG/2ML NEBU Take 2 mLs (15 mcg total) by nebulization 2 (two) times daily.     aspirin  EC 81 MG tablet Take 1 tablet (81 mg total) by mouth daily.     budesonide  (PULMICORT ) 0.5 MG/2ML nebulizer solution Take 2  mLs (0.5 mg total) by nebulization 2 (two) times daily for 5 days.     dapagliflozin  propanediol (FARXIGA ) 10 MG TABS tablet Take 1 tablet (10 mg total) by mouth daily.     furosemide  (LASIX ) 40 MG tablet Take 1 tablet (40 mg total) by mouth daily.     ipratropium-albuterol  (DUONEB) 0.5-2.5 (3) MG/3ML SOLN Take 3 mLs by nebulization every 6 (six) hours as needed.     lidocaine  (LIDODERM ) 5 % Place 1 patch onto the skin daily. Remove & Discard patch within 12 hours or as directed by MD 30 patch 0   metoprolol  succinate (TOPROL -XL) 25 MG 24 hr tablet Take 1 tablet (25 mg total) by mouth daily.     montelukast  (SINGULAIR ) 10 MG tablet Take 10 mg by mouth at bedtime.     Multiple Vitamin (MULTIVITAMIN WITH MINERALS) TABS tablet Take 1 tablet by mouth daily.     omeprazole (PRILOSEC) 20 MG capsule Take 20 mg by mouth 2 (two) times daily.     [Paused] osimertinib  mesylate (TAGRISSO ) 80 MG tablet Take 1 tablet (80 mg total) by mouth daily. 30 tablet 3   oxyCODONE  (OXY IR/ROXICODONE ) 5 MG immediate release tablet Take 1 tablet (5 mg total) by mouth every 6 (six) hours as needed for moderate pain (pain score 4-6), breakthrough pain or severe pain (pain score 7-10). 10 tablet 0   pantoprazole  (PROTONIX ) 40 MG tablet Take 1 tablet (40 mg total) by mouth daily. (Patient not taking: Reported on 03/30/2023)     polyethylene glycol (MIRALAX  / GLYCOLAX ) 17 g packet Take 17 g by mouth daily as needed for mild constipation or moderate constipation. Meds to bed, thanks     potassium chloride  SA (KLOR-CON  M) 20 MEQ tablet Take 1 tablet (20 mEq total) by mouth 2 (two) times daily. 16 tablet 0   revefenacin  (YUPELRI ) 175 MCG/3ML nebulizer solution Take 3 mLs (175 mcg total) by nebulization daily.     rosuvastatin  (CRESTOR ) 20 MG tablet Take 1 tablet (20 mg total) by mouth daily.     No current facility-administered medications for this encounter.     REVIEW OF SYSTEMS: On review of systems, the patient's daughter  states she's doing pretty well without any specific complaints.      PHYSICAL EXAM:  Unable to assess due to encounter types    ECOG = 0  0 - Asymptomatic (Fully active, able to carry on all predisease activities without restriction)  1 - Symptomatic but completely ambulatory (Restricted in physically strenuous activity but ambulatory and able to carry out work of a light or sedentary nature. For example, light housework, office work)  2 - Symptomatic, <50% in bed during the day (Ambulatory and capable of all self care but unable to carry out any work activities. Up and about more than 50% of waking hours)  3 - Symptomatic, >50% in bed, but not bedbound (Capable of only limited self-care, confined to bed or chair 50%  or more of waking hours)  4 - Bedbound (Completely disabled. Cannot carry on any self-care. Totally confined to bed or chair)  5 - Death   Aurea Blossom MM, Creech RH, Tormey DC, et al. [phone]). Toxicity and response criteria of the [facility]. Am. Hillard Lowes. Oncol. 5 (6): 649-55    LABORATORY DATA:  Lab Results  Component Value Date   WBC 5.9 05/17/2023   HGB 12.5 05/17/2023   HCT 38.2 05/17/2023   MCV 86.2 05/17/2023   PLT 395 05/17/2023   Lab Results  Component Value Date   NA 140 05/17/2023   K 3.0 (L) 05/17/2023   CL 101 05/17/2023   CO2 31 05/17/2023   Lab Results  Component Value Date   ALT 9 05/17/2023   AST 14 (L) 05/17/2023   ALKPHOS 350 (H) 05/17/2023   BILITOT 0.3 05/17/2023      RADIOGRAPHY: No results found.     IMPRESSION/PLAN: 1. Progressive Stage IV NSCLC, adenocarcinoma , EGFR mutated of the RLL with left adrenal metastasis. Dr. Jeryl Moris has reviewed her imaging to date. We discussed that he would offer a course of ablative radiotherapy of either stereotactic body radiotherapy (SBRT) every other day for 5 fractions, or up to 10 fractions of ultrahypofractionated treatment daily for 8-10 treatments. The number of  sessions will be determined by motion and dosimetry calculations.  We discussed the risks, benefits, short, and long term effects of radiotherapy, as well as the curative intent, and the patient's daughter believes her mother would be interested in proceeding. I reviewed the delivery and logistics of radiotherapy and the patient will be contacted to coordinate treatment planning by our simulation department. She will be asked to sign consent at that time.  This encounter was conducted via telephone.  The patient has provided two factor identification and has given verbal consent for this type of encounter and has been advised to only accept a meeting of this type in a secure network environment. The time spent during this encounter was 35 minutes including preparation, discussion, and coordination of the patient's care. The attendants for this meeting include   Anna Finley  and Anna Finley During the encounter,   Anna Finley was located at [facility] Radiation Oncology Department.  Anna Finley was located at home and not a part of our discussion but I spoke with her daughter Anna Finley.     Anna Finley, [facility]   **Disclaimer: This note was dictated with voice recognition software. Similar sounding words can inadvertently be transcribed and this note may contain transcription errors which may not have been corrected upon publication of note.**

## 2023-07-05 NOTE — Progress Notes (Signed)
 Per office visit on 06/20/23, Tagrisso  discontinued in February due to cardiac arrest from QT prolongation due to Tagrisso . Disenrolled.

## 2023-07-11 ENCOUNTER — Telehealth (HOSPITAL_BASED_OUTPATIENT_CLINIC_OR_DEPARTMENT_OTHER): Payer: Self-pay

## 2023-07-14 ENCOUNTER — Encounter: Payer: Self-pay | Admitting: Internal Medicine

## 2023-07-14 ENCOUNTER — Ambulatory Visit
Admission: RE | Admit: 2023-07-14 | Discharge: 2023-07-14 | Disposition: A | Discharge status: Home / Self Care | Source: Ambulatory Visit | Attending: Radiation Oncology | Admitting: Radiation Oncology

## 2023-07-14 DIAGNOSIS — C3431 Malignant neoplasm of lower lobe, right bronchus or lung: Secondary | ICD-10-CM | POA: Diagnosis present

## 2023-07-14 DIAGNOSIS — C7971 Secondary malignant neoplasm of right adrenal gland: Secondary | ICD-10-CM | POA: Diagnosis not present

## 2023-08-01 ENCOUNTER — Ambulatory Visit: Admission: RE | Admit: 2023-08-01 | Source: Ambulatory Visit | Admitting: Radiation Oncology

## 2023-08-01 ENCOUNTER — Encounter: Payer: Self-pay | Admitting: Internal Medicine

## 2023-08-01 DIAGNOSIS — C3431 Malignant neoplasm of lower lobe, right bronchus or lung: Secondary | ICD-10-CM | POA: Insufficient documentation

## 2023-08-01 DIAGNOSIS — C7971 Secondary malignant neoplasm of right adrenal gland: Secondary | ICD-10-CM | POA: Diagnosis not present

## 2023-08-02 ENCOUNTER — Ambulatory Visit: Admitting: Radiation Oncology

## 2023-08-03 ENCOUNTER — Ambulatory Visit: Admitting: Radiation Oncology

## 2023-08-04 ENCOUNTER — Ambulatory Visit: Admitting: Radiation Oncology

## 2023-08-07 ENCOUNTER — Ambulatory Visit: Admitting: Radiation Oncology

## 2023-08-09 ENCOUNTER — Ambulatory Visit: Admitting: Radiation Oncology

## 2023-08-10 ENCOUNTER — Telehealth: Payer: Self-pay | Admitting: Radiation Oncology

## 2023-08-10 NOTE — Telephone Encounter (Signed)
 I spoke with Meade Louder, the patient's daughter, unfortunately Meade has recently been in the hospital herself and was diagnosed with a stroke and just came home from the hospital today.  Her mother Anna Finley her patient has missed multiple appointments because of this since [place] provides her mother with transportation and accompanies her to all of her visits.  She states that she will not be able to bring her mother for her treatment but her daughter Willaim Childes could accompany her even though she cannot provide transportation.  I let her know the importance of proceeding with treatment so that we did not need to replan her therapy if there were further delays.  I sent an email to the staff of our department to try and coordinate transportation for the patient and Ms. Eleanor Gatliff is in agreement.  We tentatively have her appointment scheduled for next Tuesday.

## 2023-08-11 ENCOUNTER — Other Ambulatory Visit: Payer: Self-pay

## 2023-08-11 ENCOUNTER — Ambulatory Visit: Admitting: Radiation Oncology

## 2023-08-14 ENCOUNTER — Ambulatory Visit: Admitting: Radiation Oncology

## 2023-08-14 ENCOUNTER — Other Ambulatory Visit

## 2023-08-15 ENCOUNTER — Ambulatory Visit
Admission: RE | Admit: 2023-08-15 | Discharge: 2023-08-15 | Disposition: A | Discharge status: Home / Self Care | Source: Ambulatory Visit | Attending: Radiation Oncology | Admitting: Radiation Oncology

## 2023-08-15 ENCOUNTER — Other Ambulatory Visit: Payer: Self-pay

## 2023-08-15 ENCOUNTER — Inpatient Hospital Stay: Attending: Physician Assistant

## 2023-08-15 DIAGNOSIS — C3431 Malignant neoplasm of lower lobe, right bronchus or lung: Secondary | ICD-10-CM | POA: Diagnosis not present

## 2023-08-15 LAB — RAD ONC ARIA SESSION SUMMARY
Course Elapsed Days: 0
Plan Fractions Treated to Date: 1
Plan Prescribed Dose Per Fraction: 10 Gy
Plan Total Fractions Prescribed: 5
Plan Total Prescribed Dose: 50 Gy
Reference Point Dosage Given to Date: 10 Gy
Reference Point Session Dosage Given: 10 Gy
Session Number: 1

## 2023-08-17 ENCOUNTER — Ambulatory Visit
Admission: RE | Admit: 2023-08-17 | Discharge: 2023-08-17 | Disposition: A | Discharge status: Home / Self Care | Source: Ambulatory Visit | Attending: Radiation Oncology | Admitting: Radiation Oncology

## 2023-08-17 ENCOUNTER — Inpatient Hospital Stay

## 2023-08-17 ENCOUNTER — Other Ambulatory Visit: Payer: Self-pay

## 2023-08-17 ENCOUNTER — Ambulatory Visit
Admission: RE | Admit: 2023-08-17 | Discharge: 2023-08-17 | Disposition: A | Discharge status: Home / Self Care | Source: Ambulatory Visit | Attending: Radiation Oncology

## 2023-08-17 DIAGNOSIS — C3431 Malignant neoplasm of lower lobe, right bronchus or lung: Secondary | ICD-10-CM | POA: Diagnosis not present

## 2023-08-17 LAB — RAD ONC ARIA SESSION SUMMARY
Course Elapsed Days: 2
Plan Fractions Treated to Date: 2
Plan Prescribed Dose Per Fraction: 10 Gy
Plan Total Fractions Prescribed: 5
Plan Total Prescribed Dose: 50 Gy
Reference Point Dosage Given to Date: 20 Gy
Reference Point Session Dosage Given: 10 Gy
Session Number: 2

## 2023-08-18 ENCOUNTER — Telehealth: Payer: Self-pay | Admitting: Internal Medicine

## 2023-08-18 ENCOUNTER — Inpatient Hospital Stay: Attending: Physician Assistant

## 2023-08-18 ENCOUNTER — Ambulatory Visit (HOSPITAL_COMMUNITY): Attending: Internal Medicine

## 2023-08-18 NOTE — Telephone Encounter (Signed)
 The patient and her daughter called to cancel all appointments for CT scan. The patient is not feeling well and will call back to reschedule when feeling better.

## 2023-08-21 ENCOUNTER — Other Ambulatory Visit: Payer: Self-pay

## 2023-08-21 ENCOUNTER — Ambulatory Visit
Admission: RE | Admit: 2023-08-21 | Discharge: 2023-08-21 | Disposition: A | Discharge status: Home / Self Care | Source: Ambulatory Visit | Attending: Radiation Oncology | Admitting: Radiation Oncology

## 2023-08-21 DIAGNOSIS — C3431 Malignant neoplasm of lower lobe, right bronchus or lung: Secondary | ICD-10-CM | POA: Diagnosis present

## 2023-08-21 DIAGNOSIS — C7971 Secondary malignant neoplasm of right adrenal gland: Secondary | ICD-10-CM | POA: Diagnosis present

## 2023-08-21 LAB — RAD ONC ARIA SESSION SUMMARY
Course Elapsed Days: 6
Plan Fractions Treated to Date: 3
Plan Prescribed Dose Per Fraction: 10 Gy
Plan Total Fractions Prescribed: 5
Plan Total Prescribed Dose: 50 Gy
Reference Point Dosage Given to Date: 30 Gy
Reference Point Session Dosage Given: 10 Gy
Session Number: 3

## 2023-08-23 ENCOUNTER — Ambulatory Visit: Admitting: Internal Medicine

## 2023-08-23 ENCOUNTER — Other Ambulatory Visit: Payer: Self-pay

## 2023-08-23 ENCOUNTER — Ambulatory Visit

## 2023-08-23 ENCOUNTER — Ambulatory Visit
Admission: RE | Admit: 2023-08-23 | Discharge: 2023-08-23 | Disposition: A | Discharge status: Home / Self Care | Source: Ambulatory Visit | Attending: Radiation Oncology | Admitting: Radiation Oncology

## 2023-08-23 ENCOUNTER — Ambulatory Visit: Admitting: Physician Assistant

## 2023-08-23 DIAGNOSIS — C3431 Malignant neoplasm of lower lobe, right bronchus or lung: Secondary | ICD-10-CM | POA: Diagnosis not present

## 2023-08-23 LAB — RAD ONC ARIA SESSION SUMMARY
Course Elapsed Days: 8
Plan Fractions Treated to Date: 4
Plan Prescribed Dose Per Fraction: 10 Gy
Plan Total Fractions Prescribed: 5
Plan Total Prescribed Dose: 50 Gy
Reference Point Dosage Given to Date: 40 Gy
Reference Point Session Dosage Given: 10 Gy
Session Number: 4

## 2023-08-23 NOTE — Progress Notes (Addendum)
  Radiation Oncology         [phone] ________________________________  Name: Ellia Knowlton MRN: [id number]  Date: 08/23/2023  DOB: 09/30/1945  End of Treatment Note  Diagnosis:   Progressive Stage IV NSCLC, adenocarcinoma , EGFR mutated of the RLL with left adrenal metastasis.   Indication for treatment: palliative overall, locally curative       Radiation treatment dates:   08/15/23- present  Site/dose:   The patient is receiving radiation for a left adrenal metastasis with a course of stereotactic body radiation treatment.  The patient has received 40 Gy to date over 4 of the planned fractions using a SBRT/IMRT technique. Motion management was utilized for planning and treatment including a 4D CT at the time of simulation.  Narrative: The patient tolerated radiation treatment relatively well.  She has been having some headaches recently without any progressive symptoms or neurologic complaints but is out of her BP medication and just establishing with a new PCP. Her BP was 148/83 in clinic and I called her PCP office to communicate she is out of her medication and needs this sent in. Their office staff too my message and will message her provider. She's aware to be evaluated if her symptoms abruptly changed.  Plan: The patient will complete radiation on Friday this week. The patient will receive a call in about one month from the radiation oncology department. She will continue follow up with Dr. Sherrod as well.      Donald KYM Husband, PAC

## 2023-08-24 ENCOUNTER — Other Ambulatory Visit: Payer: Self-pay

## 2023-08-25 ENCOUNTER — Other Ambulatory Visit: Payer: Self-pay

## 2023-08-25 ENCOUNTER — Ambulatory Visit
Admission: RE | Admit: 2023-08-25 | Discharge: 2023-08-25 | Disposition: A | Discharge status: Home / Self Care | Source: Ambulatory Visit | Attending: Radiation Oncology | Admitting: Radiation Oncology

## 2023-08-25 ENCOUNTER — Ambulatory Visit

## 2023-08-25 DIAGNOSIS — C3431 Malignant neoplasm of lower lobe, right bronchus or lung: Secondary | ICD-10-CM

## 2023-08-25 DIAGNOSIS — C7971 Secondary malignant neoplasm of right adrenal gland: Secondary | ICD-10-CM

## 2023-08-25 LAB — RAD ONC ARIA SESSION SUMMARY
Course Elapsed Days: 10
Plan Fractions Treated to Date: 5
Plan Prescribed Dose Per Fraction: 10 Gy
Plan Total Fractions Prescribed: 5
Plan Total Prescribed Dose: 50 Gy
Reference Point Dosage Given to Date: 50 Gy
Reference Point Session Dosage Given: 10 Gy
Session Number: 5

## 2023-08-28 ENCOUNTER — Telehealth: Payer: Self-pay | Admitting: Internal Medicine

## 2023-08-28 NOTE — Telephone Encounter (Signed)
 Left the patient a voicemail with the appointment details.

## 2023-08-28 NOTE — Radiation Completion Notes (Addendum)
  Radiation Oncology         [phone] ________________________________  Name: Anna Finley MRN: [id number]  Date of Service: 08/25/2023  DOB: June 27, 1945  End of Treatment Note    Diagnosis: Progressive Stage IV NSCLC, adenocarcinoma , EGFR mutated of the RLL with left adrenal metastasis.   Intent: Curative     ==========DELIVERED PLANS==========  First Treatment Date: 2023-08-15 Last Treatment Date: 2023-08-25   Plan Name: Abd_R_SBRT Site: Adrenal Gland, Right Technique: SBRT/SRT-IMRT Mode: Photon Dose Per Fraction: 10 Gy Prescribed Dose (Delivered / Prescribed): 50 Gy / 50 Gy Prescribed Fxs (Delivered / Prescribed): 5 / 5     ==========ON TREATMENT VISIT DATES========== 2023-08-15, 2023-08-17, 2023-08-17, 2023-08-21, 2023-08-23, 2023-08-25    See weekly On Treatment Notes in Epic for details in the Media tab (listed as Progress notes on the On Treatment Visit Dates listed above).   The patient tolerated radiation.   The patient will receive a call in about one month from the radiation oncology department. He will continue follow up with Dr. Sherrod as well.      Donald KYM Husband, PAC

## 2023-08-29 ENCOUNTER — Inpatient Hospital Stay

## 2023-08-29 ENCOUNTER — Ambulatory Visit (HOSPITAL_COMMUNITY)

## 2023-09-11 ENCOUNTER — Ambulatory Visit (HOSPITAL_COMMUNITY)

## 2023-09-13 ENCOUNTER — Inpatient Hospital Stay: Admitting: Internal Medicine

## 2023-09-21 ENCOUNTER — Ambulatory Visit (HOSPITAL_COMMUNITY): Admission: RE | Admit: 2023-09-21 | Source: Ambulatory Visit

## 2023-09-22 NOTE — Progress Notes (Signed)
  Radiation Oncology         [phone] ________________________________  Name: Areanna Gengler MRN: [id number]  Date of Service: 09/25/2023  DOB: 13-Jun-1945  Post Treatment Telephone Note  Diagnosis:    Progressive Stage IV NSCLC, adenocarcinoma , EGFR mutated of the RLL with left adrenal metastasis.   The patient was available for call today.   Symptoms of fatigue have not improved since completing therapy.  Symptoms of skin changes have improved since completing therapy.  Symptoms of nausea or vomiting have improved since completing therapy.   The patient has scheduled follow up with her medical oncologist Dr. Emery  for ongoing surveillance, and was encouraged to call if she develops concerns or questions regarding radiation.     Patient missed CT scan on 09/21/23. Will reach out to Dr. Joyceann office to see if scan can be rescheduled. Daughter is also requesting transportation.   Spoke with patients daughter on 09/22/23 @ 2:13pm.

## 2023-09-25 ENCOUNTER — Ambulatory Visit
Admission: RE | Admit: 2023-09-25 | Discharge: 2023-09-25 | Disposition: A | Discharge status: Home / Self Care | Source: Ambulatory Visit | Attending: Internal Medicine | Admitting: Internal Medicine

## 2023-09-25 DIAGNOSIS — C7971 Secondary malignant neoplasm of right adrenal gland: Secondary | ICD-10-CM | POA: Insufficient documentation

## 2023-10-10 ENCOUNTER — Telehealth: Payer: Self-pay | Admitting: Internal Medicine

## 2023-10-10 NOTE — Telephone Encounter (Signed)
 Left a voicemail asking for the patient or daughter to contact the radiology department to reschedule the CT scan and to also to call us  back to reschedule the labs and MD visit.

## 2023-10-31 ENCOUNTER — Telehealth: Payer: Self-pay

## 2023-10-31 NOTE — Telephone Encounter (Signed)
 Spoke with patient's daughter, Meade, regarding missed CT scan appointment. Meade stated it was okay to reschedule the patient at any time but they prefer afternoon appointments. Contacted centralized scheduling and scheduled CT scan for 11/08/23 at 3:00 PM with a 1:00 PM arrival time for oral contrast.  Message sent to scheduler to arrange a follow-up appointment with the provider approximately one week after the CT scan.

## 2023-11-01 ENCOUNTER — Other Ambulatory Visit: Payer: Self-pay

## 2023-11-08 ENCOUNTER — Other Ambulatory Visit: Payer: Self-pay | Admitting: Internal Medicine

## 2023-11-08 ENCOUNTER — Ambulatory Visit (HOSPITAL_COMMUNITY)
Admission: RE | Admit: 2023-11-08 | Discharge: 2023-11-08 | Disposition: A | Source: Ambulatory Visit | Attending: Internal Medicine | Admitting: Internal Medicine

## 2023-11-08 ENCOUNTER — Ambulatory Visit (HOSPITAL_COMMUNITY)
Admission: RE | Admit: 2023-11-08 | Discharge: 2023-11-08 | Disposition: A | Discharge status: Home / Self Care | Source: Ambulatory Visit | Attending: Internal Medicine | Admitting: Internal Medicine

## 2023-11-08 ENCOUNTER — Encounter: Payer: Self-pay | Admitting: Internal Medicine

## 2023-11-08 DIAGNOSIS — C3490 Malignant neoplasm of unspecified part of unspecified bronchus or lung: Secondary | ICD-10-CM | POA: Insufficient documentation

## 2023-11-08 MED ORDER — IOHEXOL 9 MG/ML PO SOLN: 1000.0000 mL | ORAL | Status: DC

## 2023-11-08 MED ORDER — IOHEXOL 300 MG/ML  SOLN
80.0000 mL | Freq: Once | INTRAMUSCULAR | Status: AC | PRN
Start: 2023-11-08 — End: 2023-11-08
  Administered 2023-11-08: 80 mL via INTRAVENOUS

## 2023-11-13 ENCOUNTER — Telehealth: Payer: Self-pay | Admitting: Internal Medicine

## 2023-11-13 NOTE — Telephone Encounter (Signed)
 Scheduled patient for labs and follow-up in 2 weeks. Called and left a voicemail with the appointment details.

## 2023-11-14 ENCOUNTER — Other Ambulatory Visit: Payer: Self-pay

## 2023-11-21 ENCOUNTER — Other Ambulatory Visit: Payer: Self-pay

## 2023-11-27 NOTE — Progress Notes (Deleted)
 [facility] OFFICE PROGRESS NOTE  [name], MD [address] [place] [address]  DIAGNOSIS: ***  PRIOR THERAPY:  CURRENT THERAPY:  INTERVAL HISTORY: Anna Finley 78 y.o. female returns for *** regular *** visit for followup of ***   MEDICAL HISTORY: Past Medical History:  Diagnosis Date   COPD (chronic obstructive pulmonary disease) with chronic bronchitis (HCC)    HFrEF (heart failure with reduced ejection fraction) (HCC) 01/23/2023   HTN (hypertension)    Pneumothorax     ALLERGIES:  has no known allergies.  MEDICATIONS:  Current Outpatient Medications  Medication Sig Dispense Refill   acetaminophen  (TYLENOL ) 500 MG tablet Take 500 mg by mouth in the morning, at noon, and at bedtime.     alum & mag hydroxide-simeth (MAALOX/MYLANTA) 200-200-20 MG/5ML suspension Take 30 mLs by mouth every 4 (four) hours as needed for indigestion or heartburn.     arformoterol  (BROVANA ) 15 MCG/2ML NEBU Take 2 mLs (15 mcg total) by nebulization 2 (two) times daily.     aspirin  EC 81 MG tablet Take 1 tablet (81 mg total) by mouth daily.     budesonide  (PULMICORT ) 0.5 MG/2ML nebulizer solution Take 2 mLs (0.5 mg total) by nebulization 2 (two) times daily for 5 days.     dapagliflozin  propanediol (FARXIGA ) 10 MG TABS tablet Take 1 tablet (10 mg total) by mouth daily.     furosemide  (LASIX ) 40 MG tablet Take 1 tablet (40 mg total) by mouth daily.     ipratropium-albuterol  (DUONEB) 0.5-2.5 (3) MG/3ML SOLN Take 3 mLs by nebulization every 6 (six) hours as needed.     lidocaine  (LIDODERM ) 5 % Place 1 patch onto the skin daily. Remove & Discard patch within 12 hours or as directed by MD 30 patch 0   metoprolol  succinate (TOPROL -XL) 25 MG 24 hr tablet Take 1 tablet (25 mg total) by mouth daily.     montelukast  (SINGULAIR ) 10 MG tablet Take 10 mg by mouth at bedtime.     Multiple Vitamin (MULTIVITAMIN WITH MINERALS) TABS tablet Take 1 tablet by mouth daily.      omeprazole (PRILOSEC) 20 MG capsule Take 20 mg by mouth 2 (two) times daily.     [Paused] osimertinib  mesylate (TAGRISSO ) 80 MG tablet Take 1 tablet (80 mg total) by mouth daily. 30 tablet 3   oxyCODONE  (OXY IR/ROXICODONE ) 5 MG immediate release tablet Take 1 tablet (5 mg total) by mouth every 6 (six) hours as needed for moderate pain (pain score 4-6), breakthrough pain or severe pain (pain score 7-10). 10 tablet 0   pantoprazole  (PROTONIX ) 40 MG tablet Take 1 tablet (40 mg total) by mouth daily. (Patient not taking: Reported on 03/30/2023)     polyethylene glycol (MIRALAX  / GLYCOLAX ) 17 g packet Take 17 g by mouth daily as needed for mild constipation or moderate constipation. Meds to bed, thanks     potassium chloride  SA (KLOR-CON  M) 20 MEQ tablet Take 1 tablet (20 mEq total) by mouth 2 (two) times daily. 16 tablet 0   revefenacin  (YUPELRI ) 175 MCG/3ML nebulizer solution Take 3 mLs (175 mcg total) by nebulization daily.     rosuvastatin  (CRESTOR ) 20 MG tablet Take 1 tablet (20 mg total) by mouth daily.     No current facility-administered medications for this visit.    SURGICAL HISTORY:  Past Surgical History:  Procedure Laterality Date   BRONCHIAL NEEDLE ASPIRATION BIOPSY  04/20/2021   Procedure: BRONCHIAL NEEDLE ASPIRATION BIOPSIES;  Surgeon:  Claudene Toribio BROCKS, MD;  Location: [facility];  Service: Endoscopy;;   BRONCHIAL WASHINGS  04/20/2021   Procedure: BRONCHIAL WASHINGS;  Surgeon: Claudene Toribio BROCKS, MD;  Location: [facility];  Service: Endoscopy;;   ENDOBRONCHIAL ULTRASOUND N/A 04/20/2021   Procedure: ENDOBRONCHIAL ULTRASOUND;  Surgeon: Claudene Toribio BROCKS, MD;  Location: [facility];  Service: Endoscopy;  Laterality: N/A;   HIP ARTHROPLASTY     VIDEO BRONCHOSCOPY Left 04/20/2021   Procedure: VIDEO BRONCHOSCOPY WITH FLUORO;  Surgeon: Claudene Toribio BROCKS, MD;  Location: [facility];  Service: Endoscopy;  Laterality: Left;  EBUS    REVIEW OF SYSTEMS:   Review of Systems  Constitutional: Negative  for appetite change, chills, fatigue, fever and unexpected weight change.  HENT:   Negative for mouth sores, nosebleeds, sore throat and trouble swallowing.   Eyes: Negative for eye problems and icterus.  Respiratory: Negative for cough, hemoptysis, shortness of breath and wheezing.   Cardiovascular: Negative for chest pain and leg swelling.  Gastrointestinal: Negative for abdominal pain, constipation, diarrhea, nausea and vomiting.  Genitourinary: Negative for bladder incontinence, difficulty urinating, dysuria, frequency and hematuria.   Musculoskeletal: Negative for back pain, gait problem, neck pain and neck stiffness.  Skin: Negative for itching and rash.  Neurological: Negative for dizziness, extremity weakness, gait problem, headaches, light-headedness and seizures.  Hematological: Negative for adenopathy. Does not bruise/bleed easily.  Psychiatric/Behavioral: Negative for confusion, depression and sleep disturbance. The patient is not nervous/anxious.     PHYSICAL EXAMINATION:  There were no vitals taken for this visit.  ECOG PERFORMANCE STATUS: {CHL ONC ECOG [id number]  Physical Exam  Constitutional: Oriented to person, place, and time and well-developed, well-nourished, and in no distress. No distress.  HENT:  Head: Normocephalic and atraumatic.  Mouth/Throat: Oropharynx is clear and moist. No oropharyngeal exudate.  Eyes: Conjunctivae are normal. Right eye exhibits no discharge. Left eye exhibits no discharge. No scleral icterus.  Neck: Normal range of motion. Neck supple.  Cardiovascular: Normal rate, regular rhythm, normal heart sounds and intact distal pulses.   Pulmonary/Chest: Effort normal and breath sounds normal. No respiratory distress. No wheezes. No rales.  Abdominal: Soft. Bowel sounds are normal. Exhibits no distension and no mass. There is no tenderness.  Musculoskeletal: Normal range of motion. Exhibits no edema.  Lymphadenopathy:    No cervical  adenopathy.  Neurological: Alert and oriented to person, place, and time. Exhibits normal muscle tone. Gait normal. Coordination normal.  Skin: Skin is warm and dry. No rash noted. Not diaphoretic. No erythema. No pallor.  Psychiatric: Mood, memory and judgment normal.  Vitals reviewed.  LABORATORY DATA: Lab Results  Component Value Date   WBC 5.9 05/17/2023   HGB 12.5 05/17/2023   HCT 38.2 05/17/2023   MCV 86.2 05/17/2023   PLT 395 05/17/2023      Chemistry      Component Value Date/Time   NA 140 05/17/2023 1004   K 3.0 (L) 05/17/2023 1004   CL 101 05/17/2023 1004   CO2 31 05/17/2023 1004   BUN 13 05/17/2023 1004   CREATININE 0.86 05/17/2023 1004      Component Value Date/Time   CALCIUM  9.0 05/17/2023 1004   ALKPHOS 350 (H) 05/17/2023 1004   AST 14 (L) 05/17/2023 1004   ALT 9 05/17/2023 1004   BILITOT 0.3 05/17/2023 1004       RADIOGRAPHIC STUDIES:  CT CHEST ABDOMEN PELVIS W CONTRAST Result Date: 11/11/2023 CLINICAL DATA:  Non-small-cell lung cancer restaging * Tracking Code: BO * EXAM: CT CHEST,  ABDOMEN, AND PELVIS WITH CONTRAST TECHNIQUE: Multidetector CT imaging of the chest, abdomen and pelvis was performed following the standard protocol during bolus administration of intravenous contrast. RADIATION DOSE REDUCTION: This exam was performed according to the departmental dose-optimization program which includes automated exposure control, adjustment of the mA and/or kV according to patient size and/or use of iterative reconstruction technique. CONTRAST:  80mL OMNIPAQUE  IOHEXOL  300 MG/ML SOLN additional enteric contrast COMPARISON:  05/30/2023 FINDINGS: CT CHEST FINDINGS Cardiovascular: Aortic atherosclerosis. Normal heart size. Left and right coronary artery calcifications. No pericardial effusion. Mediastinum/Nodes: No enlarged mediastinal, hilar, or axillary lymph nodes. Small hiatal hernia. Thyroid  gland, trachea, and esophagus demonstrate no significant findings.  Lungs/Pleura: Mild centrilobular emphysema. Diffuse bilateral bronchial wall thickening. Unchanged post treatment/post radiation appearance of the chest with fibrotic consolidation and volume loss of the perihilar right middle lobe and right lower lobe (series 4, image 74). Unchanged treated right lower lobe nodule measuring 1.7 x 1.5 cm (series 4, image 67). No pleural effusion or pneumothorax. Musculoskeletal: No chest wall abnormality. No acute osseous findings. CT ABDOMEN PELVIS FINDINGS Hepatobiliary: No solid liver abnormality is seen. No gallstones, gallbladder wall thickening, or biliary dilatation. Pancreas: Unremarkable. No pancreatic ductal dilatation or surrounding inflammatory changes. Spleen: Normal in size without significant abnormality. Adrenals/Urinary Tract: New, large left adrenal nodule measuring 3.6 x 2.8 cm (series 2, image 52). Similar size, though increased hypodensity of a right adrenal nodule measuring 1.9 x 1.2 cm (series 2, image 46). Kidneys are normal, without renal calculi, solid lesion, or hydronephrosis. Bladder is unremarkable. Stomach/Bowel: Stomach is within normal limits. Appendix appears normal. No evidence of bowel wall thickening, distention, or inflammatory changes. Vascular/Lymphatic: Aortic atherosclerosis. No enlarged abdominal or pelvic lymph nodes. Reproductive: Calcified uterine fibroids. Other: No abdominal wall hernia or abnormality. No ascites. Musculoskeletal: No acute osseous findings. Partial interval healing of a mildly displaced pathologic sternal body fracture (series 6, image 86). Unchanged wedge deformity of T3 status post vertebral cement augmentation. Scattered sclerotic metastases unchanged. Status post left hip total arthroplasty. IMPRESSION: 1. Unchanged post treatment/post radiation appearance of the chest with fibrotic consolidation and volume loss of the perihilar right middle lobe and right lower lobe. Unchanged treated right lower lobe nodule  measuring 1.7 x 1.5 cm. 2. New, large left adrenal nodule measuring 3.6 x 2.8 cm, consistent with a new adrenal metastasis. Similar size, although increased hypodensity of a right adrenal nodule measuring 1.9 x 1.2 cm. 3. Partial interval healing of a mildly displaced pathologic sternal body fracture. Unchanged wedge deformity of T3 status post vertebral cement augmentation. Scattered sclerotic metastases unchanged. 4. Emphysema and diffuse bilateral bronchial wall thickening. 5. Coronary artery disease. Aortic Atherosclerosis (ICD10-I70.0) and Emphysema (ICD10-J43.9). Electronically Signed   By: Marolyn JONETTA Jaksch M.D.   On: 11/11/2023 06:44     ASSESSMENT/PLAN:  No problem-specific Assessment & Plan notes found for this encounter.   No orders of the defined types were placed in this encounter.    I spent {CHL ONC TIME VISIT - DTPQU:[id number]} counseling the patient face to face. The total time spent in the appointment was {CHL ONC TIME VISIT - DTPQU:[id number]}.  Broderick Fonseca L Callin Ashe, PA-C 11/27/23

## 2023-11-30 ENCOUNTER — Inpatient Hospital Stay: Admitting: Internal Medicine

## 2023-11-30 ENCOUNTER — Inpatient Hospital Stay

## 2023-11-30 ENCOUNTER — Inpatient Hospital Stay: Admitting: Physician Assistant

## 2023-12-01 ENCOUNTER — Other Ambulatory Visit: Payer: Self-pay

## 2023-12-07 ENCOUNTER — Inpatient Hospital Stay: Admitting: Internal Medicine

## 2023-12-07 ENCOUNTER — Inpatient Hospital Stay

## 2023-12-08 ENCOUNTER — Other Ambulatory Visit: Payer: Self-pay

## 2023-12-11 ENCOUNTER — Other Ambulatory Visit: Payer: Self-pay | Admitting: *Deleted

## 2023-12-11 DIAGNOSIS — C3490 Malignant neoplasm of unspecified part of unspecified bronchus or lung: Secondary | ICD-10-CM

## 2023-12-12 ENCOUNTER — Inpatient Hospital Stay

## 2023-12-12 ENCOUNTER — Telehealth: Payer: Self-pay

## 2023-12-12 ENCOUNTER — Inpatient Hospital Stay: Admitting: Internal Medicine

## 2023-12-12 NOTE — Telephone Encounter (Signed)
 S/w patient's daughter, Meade, to see if patient would still be able to make today's appointments. Daughter states that she mixed up the dates and thought the appointments were scheduled for Wednesday. Daughter states that she will need to reschedule patient's appointments.  Informed daughter that our scheduling team will be in contact with them within the next few business days to reschedule.

## 2023-12-15 ENCOUNTER — Other Ambulatory Visit: Payer: Self-pay

## 2023-12-20 ENCOUNTER — Telehealth: Payer: Self-pay | Admitting: Internal Medicine

## 2023-12-20 NOTE — Telephone Encounter (Signed)
 Rescheduled patients missed appointments from November. Tried to call the daughter to let her know but it said call could not be completed

## 2023-12-21 ENCOUNTER — Other Ambulatory Visit: Payer: Self-pay

## 2024-01-16 ENCOUNTER — Inpatient Hospital Stay (HOSPITAL_BASED_OUTPATIENT_CLINIC_OR_DEPARTMENT_OTHER): Admitting: Internal Medicine

## 2024-01-16 ENCOUNTER — Inpatient Hospital Stay: Attending: Internal Medicine

## 2024-01-16 ENCOUNTER — Inpatient Hospital Stay

## 2024-01-16 VITALS — BP 94/75 | HR 105 | Temp 98.2°F | Resp 17 | Ht 62.0 in | Wt 88.2 lb

## 2024-01-16 DIAGNOSIS — C3490 Malignant neoplasm of unspecified part of unspecified bronchus or lung: Secondary | ICD-10-CM

## 2024-01-16 DIAGNOSIS — C7971 Secondary malignant neoplasm of right adrenal gland: Secondary | ICD-10-CM | POA: Diagnosis not present

## 2024-01-16 DIAGNOSIS — C7951 Secondary malignant neoplasm of bone: Secondary | ICD-10-CM | POA: Insufficient documentation

## 2024-01-16 DIAGNOSIS — C7972 Secondary malignant neoplasm of left adrenal gland: Secondary | ICD-10-CM | POA: Insufficient documentation

## 2024-01-16 DIAGNOSIS — C3431 Malignant neoplasm of lower lobe, right bronchus or lung: Secondary | ICD-10-CM | POA: Insufficient documentation

## 2024-01-16 LAB — CBC WITH DIFFERENTIAL (CANCER CENTER ONLY)
Abs Immature Granulocytes: 0.03 K/uL (ref 0.00–0.07)
Basophils Absolute: 0 K/uL (ref 0.0–0.1)
Basophils Relative: 0 %
Eosinophils Absolute: 0.1 K/uL (ref 0.0–0.5)
Eosinophils Relative: 1 %
HCT: 27.5 % — ABNORMAL LOW (ref 36.0–46.0)
Hemoglobin: 9.2 g/dL — ABNORMAL LOW (ref 12.0–15.0)
Immature Granulocytes: 0 %
Lymphocytes Relative: 10 %
Lymphs Abs: 0.9 K/uL (ref 0.7–4.0)
MCH: 29.6 pg (ref 26.0–34.0)
MCHC: 33.5 g/dL (ref 30.0–36.0)
MCV: 88.4 fL (ref 80.0–100.0)
Monocytes Absolute: 0.7 K/uL (ref 0.1–1.0)
Monocytes Relative: 7 %
Neutro Abs: 8 K/uL — ABNORMAL HIGH (ref 1.7–7.7)
Neutrophils Relative %: 82 %
Platelet Count: 504 K/uL — ABNORMAL HIGH (ref 150–400)
RBC: 3.11 MIL/uL — ABNORMAL LOW (ref 3.87–5.11)
RDW: 15.6 % — ABNORMAL HIGH (ref 11.5–15.5)
WBC Count: 9.7 K/uL (ref 4.0–10.5)
nRBC: 0 % (ref 0.0–0.2)

## 2024-01-16 LAB — CMP (CANCER CENTER ONLY)
ALT: 5 U/L (ref 0–44)
AST: 42 U/L — ABNORMAL HIGH (ref 15–41)
Albumin: 3.8 g/dL (ref 3.5–5.0)
Alkaline Phosphatase: 255 U/L — ABNORMAL HIGH (ref 38–126)
Anion gap: 11 (ref 5–15)
BUN: 14 mg/dL (ref 8–23)
CO2: 24 mmol/L (ref 22–32)
Calcium: 9.3 mg/dL (ref 8.9–10.3)
Chloride: 99 mmol/L (ref 98–111)
Creatinine: 0.84 mg/dL (ref 0.44–1.00)
GFR, Estimated: >60 mL/min
Glucose, Bld: 113 mg/dL — ABNORMAL HIGH (ref 70–99)
Potassium: 4.3 mmol/L (ref 3.5–5.1)
Sodium: 133 mmol/L — ABNORMAL LOW (ref 135–145)
Total Bilirubin: 0.5 mg/dL (ref 0.0–1.2)
Total Protein: 7.2 g/dL (ref 6.5–8.1)

## 2024-01-16 NOTE — Progress Notes (Signed)
 "     [facility] [phone]) [phone]   [phone]  OFFICE PROGRESS NOTE  [name], MD [address] [place] [address]  DIAGNOSIS: Stage IV (T3, N2, M1 C) non-small cell lung cancer, adenocarcinoma presented with large right lower lobe lung mass in addition to subcarinal lymphadenopathy as well as bone metastasis diagnosed in April 2023  Molecular studies by foundation 1 showed EGFR T790M, Exon 19 deletion ([id number]) AKT2- Aplification-equivocal ARID1A- [id number]*1 RICTOR- amplification-equivocal  PDL1 Expression 50%  PRIOR THERAPY: Palliative radiotherapy to the metastatic T3 bone lesion under the care of Dr. Dewey  CURRENT THERAPY: Tagrisso  80 mg p.o. daily.  First dose started May 22, 2021.  Status post 21 months of treatment.  Her treatment has been on hold since February 2025 secondary to QTc prolongation.  INTERVAL HISTORY: Anna Finley 78 y.o. female returns to the clinic today for follow-up visit accompanied by her granddaughter. Discussed the use of AI scribe software for clinical note transcription with the patient, who gave verbal consent to proceed.  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  She was diagnosed with stage IV non-small cell lung cancer in April 2023 with EGFR exon 19 deletion, T790M mutation, and PD-L1 50%, and developed bilateral adrenal metastases. She initiated osimertinib  in May 2023, which was discontinued in February 2025 after 21 months due to QT prolongation. She has not received systemic therapy since that time, with significant gaps in follow-up and delayed imaging due to transportation barriers and changes in home nursing support.  Recent imaging from October 2025 demonstrated new metastatic disease with a 3.6 x 2.8 cm mass in the left  adrenal gland and a 1.9 x 1.2 cm mass in the right adrenal gland, both not present on prior studies. She is not currently receiving active oncologic therapy.  She endorses persistent, chronic headache described as 'always hurting.' Prior head CT in March 2025 was unremarkable.  She reports increased mucus production and cough, particularly during winter months, but denies fever or chills. Family is concerned about her susceptibility to upper respiratory infections and pneumonia, which have been recurring seasonally. No acute infectious symptoms are present at this time.     MEDICAL HISTORY: Past Medical History:  Diagnosis Date   COPD (chronic obstructive pulmonary disease) with chronic bronchitis (HCC)    HFrEF (heart failure with reduced ejection fraction) (HCC) 01/23/2023   HTN (hypertension)    Pneumothorax     ALLERGIES:  has no known allergies.  MEDICATIONS:  Current Outpatient Medications  Medication Sig Dispense Refill   acetaminophen  (TYLENOL ) 500 MG tablet Take 500 mg by mouth in the morning, at noon, and at bedtime.     alum & mag hydroxide-simeth (MAALOX/MYLANTA) 200-200-20 MG/5ML suspension Take 30 mLs by mouth every 4 (four) hours as needed for indigestion or heartburn.     arformoterol  (BROVANA ) 15 MCG/2ML NEBU Take 2 mLs (15 mcg total) by nebulization 2 (two) times daily.     aspirin  EC 81 MG tablet Take 1 tablet (81 mg total) by mouth daily.     budesonide  (PULMICORT ) 0.5 MG/2ML nebulizer solution Take 2 mLs (0.5 mg total) by nebulization 2 (two) times daily for 5 days.     dapagliflozin  propanediol (FARXIGA ) 10 MG TABS tablet Take 1 tablet (10 mg total) by mouth daily.     furosemide  (  LASIX ) 40 MG tablet Take 1 tablet (40 mg total) by mouth daily.     ipratropium-albuterol  (DUONEB) 0.5-2.5 (3) MG/3ML SOLN Take 3 mLs by nebulization every 6 (six) hours as needed.     lidocaine  (LIDODERM ) 5 % Place 1 patch onto the skin daily. Remove & Discard patch within 12 hours or as  directed by MD 30 patch 0   metoprolol  succinate (TOPROL -XL) 25 MG 24 hr tablet Take 1 tablet (25 mg total) by mouth daily.     montelukast  (SINGULAIR ) 10 MG tablet Take 10 mg by mouth at bedtime.     Multiple Vitamin (MULTIVITAMIN WITH MINERALS) TABS tablet Take 1 tablet by mouth daily.     omeprazole (PRILOSEC) 20 MG capsule Take 20 mg by mouth 2 (two) times daily.     [Paused] osimertinib  mesylate (TAGRISSO ) 80 MG tablet Take 1 tablet (80 mg total) by mouth daily. 30 tablet 3   oxyCODONE  (OXY IR/ROXICODONE ) 5 MG immediate release tablet Take 1 tablet (5 mg total) by mouth every 6 (six) hours as needed for moderate pain (pain score 4-6), breakthrough pain or severe pain (pain score 7-10). 10 tablet 0   pantoprazole  (PROTONIX ) 40 MG tablet Take 1 tablet (40 mg total) by mouth daily. (Patient not taking: Reported on 03/30/2023)     polyethylene glycol (MIRALAX  / GLYCOLAX ) 17 g packet Take 17 g by mouth daily as needed for mild constipation or moderate constipation. Meds to bed, thanks     potassium chloride  SA (KLOR-CON  M) 20 MEQ tablet Take 1 tablet (20 mEq total) by mouth 2 (two) times daily. 16 tablet 0   revefenacin  (YUPELRI ) 175 MCG/3ML nebulizer solution Take 3 mLs (175 mcg total) by nebulization daily.     rosuvastatin  (CRESTOR ) 20 MG tablet Take 1 tablet (20 mg total) by mouth daily.     No current facility-administered medications for this visit.    SURGICAL HISTORY:  Past Surgical History:  Procedure Laterality Date   BRONCHIAL NEEDLE ASPIRATION BIOPSY  04/20/2021   Procedure: BRONCHIAL NEEDLE ASPIRATION BIOPSIES;  Surgeon: Claudene Toribio BROCKS, MD;  Location: [facility];  Service: Endoscopy;;   BRONCHIAL WASHINGS  04/20/2021   Procedure: BRONCHIAL WASHINGS;  Surgeon: Claudene Toribio BROCKS, MD;  Location: [facility];  Service: Endoscopy;;   ENDOBRONCHIAL ULTRASOUND N/A 04/20/2021   Procedure: ENDOBRONCHIAL ULTRASOUND;  Surgeon: Claudene Toribio BROCKS, MD;  Location: [facility];  Service: Endoscopy;   Laterality: N/A;   HIP ARTHROPLASTY     VIDEO BRONCHOSCOPY Left 04/20/2021   Procedure: VIDEO BRONCHOSCOPY WITH FLUORO;  Surgeon: Claudene Toribio BROCKS, MD;  Location: [facility];  Service: Endoscopy;  Laterality: Left;  EBUS    REVIEW OF SYSTEMS:  Constitutional: positive for fatigue Eyes: negative Ears, nose, mouth, throat, and face: negative Respiratory: negative Cardiovascular: negative Gastrointestinal: negative Genitourinary:negative Integument/breast: negative Hematologic/lymphatic: negative Musculoskeletal:positive for arthralgias and muscle weakness Neurological: positive for headaches Behavioral/Psych: negative Endocrine: negative Allergic/Immunologic: negative   PHYSICAL EXAMINATION: General appearance: alert, cooperative, appears stated age, fatigued, and no distress Head: Normocephalic, without obvious abnormality, atraumatic Neck: no adenopathy, no JVD, supple, symmetrical, trachea midline, and thyroid  not enlarged, symmetric, no tenderness/mass/nodules Lymph nodes: Cervical, supraclavicular, and axillary nodes normal. Resp: clear to auscultation bilaterally Back: symmetric, no curvature. ROM normal. No CVA tenderness. Cardio: regular rate and rhythm, S1, S2 normal, no murmur, click, rub or gallop GI: soft, non-tender; bowel sounds normal; no masses,  no organomegaly Extremities: extremities normal, atraumatic, no cyanosis or edema Neurologic: Alert and oriented X 3, normal  strength and tone. Normal symmetric reflexes. Normal coordination and gait  ECOG PERFORMANCE STATUS: 1 - Symptomatic but completely ambulatory  Blood pressure 94/75, pulse (!) 105, temperature 98.2 F (36.8 C), temperature source Temporal, resp. rate 17, height 5' 2 (1.575 m), weight 88 lb 3.2 oz (40 kg), SpO2 100%.  LABORATORY DATA: Lab Results  Component Value Date   WBC 9.7 01/16/2024   HGB 9.2 (L) 01/16/2024   HCT 27.5 (L) 01/16/2024   MCV 88.4 01/16/2024   PLT 504 (H) 01/16/2024       Chemistry      Component Value Date/Time   NA 140 05/17/2023 1004   K 3.0 (L) 05/17/2023 1004   CL 101 05/17/2023 1004   CO2 31 05/17/2023 1004   BUN 13 05/17/2023 1004   CREATININE 0.86 05/17/2023 1004      Component Value Date/Time   CALCIUM  9.0 05/17/2023 1004   ALKPHOS 350 (H) 05/17/2023 1004   AST 14 (L) 05/17/2023 1004   ALT 9 05/17/2023 1004   BILITOT 0.3 05/17/2023 1004       RADIOGRAPHIC STUDIES: No results found.   ASSESSMENT AND PLAN: This is a 78 years old African-American female diagnosed with Stage IV (T3, N2, M1 C) non-small cell lung cancer, adenocarcinoma presented with large right lower lobe lung mass in addition to subcarinal lymphadenopathy as well as right adrenal gland and bone metastasis diagnosed in April 2023.   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   PD-L1 expression of 50%. The patient started her treatment with Tagrisso  in May 2023.  She is status post 21 months of treatment.  Her treatment has been on hold since February 2025 secondary to QTc prolongation as well as cardiac arrest at that time. The patient has been in observation since that time. She had repeat CT scan of the chest, abdomen and pelvis performed in October 2025.  She did not show up for follow-up visit after the scan until today.  I personally and independently reviewed the scan and discussed the result with the patient and her granddaughter.  Her scan showed increase in size of left adrenal metastasis as well as suspicious right adrenal metastasis. 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 She has been off systemic therapy since February 2025 due to QT prolongation. Persistent headache raises concern for intracranial metastases, though prior head CT was normal. Immunocompromised and at increased infection  risk due to age and malignancy. - Referred to radiation oncology for evaluation of adrenal metastases and consideration of local radiation therapy, emphasizing the need for reliable transportation and adherence to daily treatments. - Ordered MRI brain to assess for intracranial metastases given persistent headache and NSCLC propensity for brain involvement. - Planned follow-up in two months with repeat imaging to evaluate disease status and response to radiation therapy.  Long QT syndrome QT prolongation necessitated discontinuation of Tagrisso  in February 2025. No current systemic therapy due to this adverse effect. The patient was advised to call immediately if she has any other concerning symptoms in the interval.  All questions were answered. The patient knows to call the clinic with any problems, questions or concerns. We can certainly see the patient much sooner if necessary. The total time spent in the appointment was 30 minutes.  Disclaimer: This note was dictated with voice recognition software. Similar sounding words can inadvertently be  transcribed and may not be corrected upon review.        "

## 2024-01-17 ENCOUNTER — Other Ambulatory Visit: Payer: Self-pay

## 2024-01-22 ENCOUNTER — Telehealth: Payer: Self-pay | Admitting: Radiation Oncology

## 2024-01-22 NOTE — Telephone Encounter (Signed)
 LVM to schedule appt with PA Lanell at the request of Dr. Sherrod.

## 2024-01-23 ENCOUNTER — Telehealth: Payer: Self-pay | Admitting: Radiation Oncology

## 2024-01-23 NOTE — Telephone Encounter (Signed)
 Called pt's daughter to schedule RECON with PA Lanell. Daughter advised they are not able to do AM appts. Daughter agreeable to 1/14@2pm . Mailed appt reminder.

## 2024-01-24 NOTE — Progress Notes (Incomplete)
 GU Location of Tumor / Histology: non-small cell lung cancer  Duanna Jama Louder presented 2 months ago with signs/symptoms from imaging  Biopsies of RLL of right lung on 04/20/21 revealed:    Past/Anticipated interventions by urology, if any: None   Past/Anticipated interventions by medical oncology, if any:  She initiated osimertinib  in May 2023, which was discontinued in February 2025 after 21 months due to QT prolongation. She has not received systemic therapy since that time, with significant gaps in follow-up and delayed imaging due to transportation barriers and changes in home nursing support.  Recent imaging from October 2025 demonstrated new metastatic disease with a 3.6 x 2.8 cm mass in the left adrenal gland and a 1.9 x 1.2 cm mass in the right adrenal gland, both not present on prior studies. She is not currently receiving active oncologic therapy.   Weight changes, if any: {:18581}  Bowel/Bladder complaints, if any: {:18581}   Nausea/Vomiting, if any: {:18581}  Pain issues, if any:  {:18581}  SAFETY ISSUES: Prior radiation? yes Pacemaker/ICD? {:18581} Possible current pregnancy? no Is the patient on methotrexate? no  Current Complaints / other details:  ***

## 2024-01-31 ENCOUNTER — Ambulatory Visit

## 2024-01-31 ENCOUNTER — Ambulatory Visit: Admitting: Radiation Oncology

## 2024-01-31 ENCOUNTER — Telehealth: Payer: Self-pay | Admitting: Radiation Oncology

## 2024-01-31 NOTE — Telephone Encounter (Signed)
 Pt's daughter called to advise appt with PA Lanell would need to be r/s due to pt not feeling well and currently in ED. Daughter agreeable to r/s to 1/22@2pm  and will update team PRN.

## 2024-02-02 ENCOUNTER — Telehealth: Payer: Self-pay

## 2024-02-02 NOTE — Discharge Summary (Signed)
 ------------------------------------------------------------------------------- Attestation signed by Dorise Donnice Hammers, MD at 02/04/2024  5:01 PM I have reviewed the chart and discussed the assessment and plan with the APP. I agree with the findings and plan as documented.   Billing per APP.   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  MRI brain showing multiple hemorrhagic and nonhemorrhagic brain metastasis including intracranial mass effect and possible developing obstructive hydrocephalus, as well as two lesions that had yet to enhance which could be early metastatic lesions versus ischemic infarctions.  We consulted radiation oncology who agree that she may benefit from palliative radiation and patient was encouraged to follow-up with her outpatient radiation oncologist for potential treatment.  I discussed MRI findings with our neurologist on-call given the multiple findings and agreed that management would not change even if the areas were representative of ischemic infarctions rather than early metastatic lesions.  Patient was started on dexamethasone  for her headaches with improvement.  Palliative care was also consulted for help with symptom management.  I discussed goals of care with patient and daughter, Meade.  They decided to focus on comfort and declined EGD or other further treatments.  I later had family goals of care conversation and it was agreed that hospice was best course so that patient could enjoy as much time as possible at home with her family.  Patient was eager to leave the hospital. I encouraged the patient to follow-up with her oncologist and to schedule with her radiation oncologist for potential palliative radiation.  She was sent home with a course of dexamethasone  for headaches and low-dose opioid for pain.  Antibiotics  were discontinued as it was felt that she likely did not have a pneumonia and that her symptoms are most consistent with progressive metastatic disease rather than infection.  -------------------------------------------------------------------------------  [facility] Hospitalist  Discharge Summary   Name: REFUGIA LANEVE Age: 79 yrs  MRN: [id number] DOB: 1945/04/08  Admit Date: 01/31/2024 Admitting Physician: Zoaib Safdar Rasool, DO  Discharge Date: 02/02/2024 Discharge Physician: Hubert Gins, NP and Dorise Donnice Hammers, MD    Discharge Diagnoses:   Principal Problem (Resolved):   COPD exacerbation    (CMD) Active Problems:   Hypertension   Malignant neoplasm of lower lobe of right lung    (CMD)   Pulmonary hypertension    (CMD)   Chronic systolic congestive heart failure    (CMD)   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 Family to reach out to oncologist as already established     Hospital Course:   For full details, please see H&P, progress notes, consult notes and ancillary notes.   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 On presentation, she was found to have a COPD  exacerbation and possible post-obstructive pneumonia, with imaging revealing stable right basilar opacities on chest x-ray and multiple new pulmonary nodules on CT chest. CT head demonstrated bilateral cerebellar mass lesions suspicious for intracranial metastatic disease, and subsequent MRI confirmed multiple hemorrhagic and nonhemorrhagic brain metastases. CT abdomen/pelvis showed widespread metastatic disease including bilateral adrenal metastases, pancreatic mass, and bone involvement.  She was initially managed with intravenous azithromycin , ceftriaxone , vancomycin , and piperacillin-tazobactam for pneumonia, with renal dosing adjustments as indicated. These were discontinued. Nebulized bronchodilators and intravenous methylprednisolone  were administered for COPD exacerbation. Dexamethasone  was initiated for cerebral edema related to brain metastases, and pain was managed with scheduled acetaminophen  and PRN oxycodone . Aspirin  and heparin  were held due to concern for intracranial hemorrhage. Palliative care was consulted for symptom management and goals of care discussions. The patient and family ultimately elected for a comfort-based approach and agreed to hospice referral, with plans for discharge home and follow-up with radiation oncology for possible palliative radiation to the brain.  During the admission, she was noted to have altered mental status and poor functional status, requiring maximal assistance with ADLs and mobility, consistent with her baseline per family report. She was underweight (BMI 15.2) and received nutritional counseling to encourage PO intake and nutrient-dense foods. Laboratory evaluation revealed leukocytosis, anemia, hyponatremia, and elevated troponin, attributed to demand ischemia in the setting of heart failure and metastatic disease. Skin integrity was maintained, and no pressure injuries were noted. Discharge planning included coordination of durable medical equipment and  home hospice services, with her daughter designated as runner, broadcasting/film/video.  In summary, Ms. Melder was admitted for acute respiratory symptoms in the context of advanced metastatic lung cancer, with hospitalization focused on management of COPD exacerbation, post-obstructive pneumonia, and symptomatic brain metastases, culminating in transition to comfort-focused care and hospice. [facility] to see family at home and evaluate needs.      The patient's chronic medical conditions were treated accordingly per the patient's home medication regimen except as noted in the plan above and in the medication list below.    Discharge Condition:   Disposition: Patient discharged to Home or Self Care in stable condition.  Diet at discharge:    Dietary Orders  (From admission, onward)               Ordered    Adult Diet- Regular  Diet effective now       References:    Medical Nutrition Management (MNM) for Registered Dietitian  Question Answer Comment  Diet type: Regular   Medical Nutrition Management By RD: Yes, Medical Nutrition Management By RD      01/31/24 1854            Activity at Discharge: as tolerated  Wound/Incision Care Instructions: none  Photo of wound/incision: none  Wound 12/16/22 Incision Thigh Anterior;Left;Proximal (Active)  Date First Assessed/Time First Assessed: 12/16/22 0801   Pre-Existing Wound: No  Primary Wound Type: Incision  Location: Thigh  Wound Location Orientation: Anterior;Left;Proximal  Wound Description (Comments): Surgical Incision - Dermabond & aquacel     Physical Exam at Discharge   BP 91/68 (BP Location: Right arm, Patient Position: Lying)   Pulse 67   Temp 97.3 F (36.3 C) (Oral)   Resp 18   Ht 1.575 m (5' 2)   Wt 39.3 kg (86 lb 10.3 oz)  SpO2 100%   BMI 15.85 kg/m   Physical Exam Constitutional:      Appearance: She is underweight. She is ill-appearing.  HENT:     Mouth/Throat:     Mouth: Mucous membranes  are moist.  Eyes:     Pupils: Pupils are equal, round, and reactive to light.  Cardiovascular:     Rate and Rhythm: Normal rate and regular rhythm.     Pulses: Normal pulses.     Heart sounds: Normal heart sounds.  Pulmonary:     Effort: Pulmonary effort is normal.     Breath sounds: Normal breath sounds.  Abdominal:     General: Bowel sounds are normal.     Palpations: Abdomen is soft.  Musculoskeletal:        General: Normal range of motion.     Right lower leg: No edema.     Left lower leg: No edema.  Skin:    General: Skin is warm and dry.  Neurological:     General: No focal deficit present.     Mental Status: She is alert. She is disoriented.    Discharge Medications:      Medication List     PAUSE taking these medications    ergocalciferol 1,250 mcg (50,000 unit) capsule Wait to take this until your doctor or other care provider tells you to start again. Commonly known as: VITAMIN D2 Take 1 capsule (50,000 Units total) by mouth once a week for 6 doses.   Humira Pen 40 mg/0.8 mL Pnkt injection Wait to take this until your doctor or other care provider tells you to start again. Generic drug: adalimumab Inject 40 mg under the skin every 14 (fourteen) days.   lisinopriL 5 mg tablet Wait to take this until your doctor or other care provider tells you to start again. Commonly known as: PRINIVIL Take 5 mg by mouth daily. for high blood pressure   methotrexate 2.5 mg tablet Wait to take this until your doctor or other care provider tells you to start again. Indications: rheumatoid arthritis. Take exactly as directed by prescriber.   Tagrisso  80 mg chemo tablet Wait to take this until your doctor or other care provider tells you to start again. Generic drug: osimertinib  Take 80 mg by mouth daily.       START taking these medications    dexAMETHasone  4 mg tablet Commonly known as: DECADRON  Take 1 tablet (4 mg total) by mouth daily with breakfast.    melatonin 3 mg tablet Take 2 tablets (6 mg total) by mouth nightly as needed for sleep.   ondansetron  4 mg disintegrating tablet Commonly known as: ZOFRAN -ODT Dissolve 1 tablet (4 mg total) on tongue every 6 (six) hours as needed for nausea or vomiting.   oxyCODONE  2.5 mg split tablet Commonly known as: ROXICODONE  Take 1 split tablet (2.5 mg total) by mouth every 4 (four) hours as needed for moderate pain (4-6).   senna 8.6 mg tablet Commonly known as: SENOKOT Take 2 tablets (17.2 mg total) by mouth daily.       CHANGE how you take these medications    acetaminophen  500 mg tablet Commonly known as: TYLENOL  Take 2 tablets (1,000 mg total) by mouth 3 (three) times a day. What changed: how much to take       CONTINUE taking these medications    amLODIPine 5 mg tablet Commonly known as: NORVASC Don't take till instructed by medical provider. Prior dose: 1 tab daily.   arformoterol   15 mcg/2 mL nebulizer solution Commonly known as: BROVANA  Inhale 15 mcg 2 (two) times a day.   Breztri Aerosphere 160-9-4.8 mcg/actuation inhaler Generic drug: budesonide -glycopyr-formoterol  Inhale 2 puffs 2 (two) times a day.   fluticasone propion-salmeteroL 250-50 mcg/dose diskus inhaler Commonly known as: ADVAIR DISKUS Inhale 1 puff  in the morning and 1 puff before bedtime.   Lasix  40 mg tablet Generic drug: furosemide  Take 1 tablet by mouth daily.   montelukast  10 mg tablet Commonly known as: SINGULAIR  Take 10 mg by mouth nightly.   omeprazole 20 mg DR capsule Commonly known as: PriLOSEC Take 20 mg by mouth 2 (two) times a day. duration: 90 days   topiramate 50 mg tablet Commonly known as: TOPAMAX Take 50 mg by mouth 2 (two) times a day.       STOP taking these medications    aspirin  325 mg tablet   rosuvastatin  20 mg tablet Commonly known as: CRESTOR    TYLENOL  PM EXTRA STRENGTH ORAL       ASK your doctor about these medications    revefenacin  175 mcg/3 mL  nebulizer solution Commonly known as: YUPELRI  Inhale 175 mcg daily.   Toprol  XL 25 mg 24 hr tablet Generic drug: metoprolol  succinate Take 1 tablet by mouth daily.         Where to Get Your Medications     These medications were sent to [id number] - [place], [place] - [address] AT [facility] [address] - PHONE: [phone] - FAX: [phone]  [address], [place] [address]    Phone: [phone]  dexAMETHasone  4 mg tablet melatonin 3 mg tablet ondansetron  4 mg disintegrating tablet oxyCODONE  2.5 mg split tablet senna 8.6 mg tablet    Information about where to get these medications is not yet available   Ask your nurse or doctor about these medications acetaminophen  500 mg tablet     Significant Diagnostic Tests:   LABS:  Lab Results  Component Value Date   WBC 21.25 (H) 02/02/2024   HGB 7.3 (L) 02/02/2024   HCT 22.2 (L) 02/02/2024   PLT 629 (H) 02/02/2024   ALT 5 (L) 01/31/2024   AST 21 01/31/2024   NA 129 (L) 02/02/2024   K 3.9 02/02/2024   CL 98 02/02/2024   CREATININE 1.08 02/02/2024   BUN 23 02/02/2024   CO2 18 (L) 02/02/2024   HGBA1C 5.5 12/09/2022   IMAGING:  CT Abdomen Pelvis W Contrast  Final Result by Lonni Natal, MD (01/14 1633)  CT ABDOMEN PELVIS W CONTRAST, 01/31/2024 4:02 PM    INDICATION: abd pain   ADDITIONAL HISTORY: None.  COMPARISON: CT 05/06/2021    TECHNIQUE: CT images of the abdomen and pelvis were obtained after   intravenous administration of iodinated contrast. Conventional axial   reconstructions and multiplanar reformatted images were submitted for   review.     FINDINGS:    . Lower Chest: Refer to chest CT 01/31/2024. Moderate hiatal hernia and   thickening of the distal esophagus. Small right breast nodule measuring   0.9 cm. Right axillary soft tissue nodules measuring up to 3.5 cm, partly   imaged    . Liver: Subcentimeter hypodensity too small to characterize.  .  Gallbladder/Biliary: Unremarkable.  SABRA Spleen: Unremarkable.  . Pancreas: Partly cystic partly solid mass tail of pancreas measuring 1.5   cm  . Adrenals: Complex partly cystic partly solid mass left adrenal gland   measuring  4.5 cm and right adrenal gland similar nodule measures 1.4 cm  . Kidneys: No hydronephrosis. Cortical scarring within both kidneys    . Peritoneum/Mesenteries/Extraperitoneum: No free air. No free fluid or   loculated drainable collection. No pathologically enlarged lymph nodes.   Complex enhancing soft tissue mass within the right flank measuring 4.8   cm, in the right paracolic gutter measuring 2 cm, posterior to the left   kidney measuring 1.5 cm (3/57), in the left paracolic gutter (6/29),   similar nodules within the mesentery measuring up to 2 cm   (3/80,65,83,85,87)  . Gastrointestinal tract: No evidence of obstruction. Soft tissue nodule   within the stomach measuring 2.5 cm (3/45). Colonic diverticula.    . Ureters: Unremarkable.  . Bladder: Excreted contrast seen within urinary bladder. Circumferential   thickening of bladder wall probably due to underdistention versus   cystitis.  . Reproductive System: Uterine fibroids. Partly obscured by artifacts from   left hip arthroplasty. Right ovarian cyst measuring 2.5 cm    . Vascular: Aortobiiliac atherosclerotic changes  . Musculoskeletal: No acute displaced fractures. Polyarticular   degenerative changes. Ill-defined permeative destructive lesion involving   the right hemisacrum with associated soft tissue extending into the sacral   foramina and encasing the nerve roots (3/89). Diffusely heterogeneous   bones. Enhancing complex intramuscular deposit right paraspinal muscle   measuring 3.8 x 2.9 cm. Sclerosis right femoral head.      IMPRESSION:    1.  Multiple soft tissue masses within the right axilla, abdomen and   pelvis as described above. Appearances are likely due to metastatic   disease from  the known bronchogenic carcinoma. Consider tissue sampling if   appropriate  2.  Bilateral adrenal metastases  3.  Partly cystic partly solid mass tail of pancreas, probably metastatic   deposit.  4.  Apparent soft tissue nodule within collapsed stomach. Consider upper   GI endoscopy if appropriate  5.  Bone metastases involving right hemisacrum with associated soft tissue   mass extending into the right sacral foramina and the right sacroiliac   joint. Consider MRI sacrum if appropriate  6.  Sclerotic focus right femoral head could represent further bone   metastases          CT Angio Chest Pulmonary Embolism  Final Result by Bennet Ozell Hurst, MD (01/15 1004)  CT ANGIO CHEST PULMONARY EMBOLISM, 01/31/2024 3:46 PM    INDICATION: chest pain, shob   COMPARISON: CT on 03/15/2023    TECHNIQUE: Iodinated contrast was administered intravenously by rapid   injection, with further multislice axial sections acquired in the   pulmonary arterial phase from the thoracic inlet to the upper abdomen.   Coronal maximal intensity projection images were created for comprehensive   analysis and diagnosis of the regional circulation.    All CT scans at [facility] and [facility]   Imaging are performed using radiation dose optimization techniques as   appropriate to a performed exam, including but not limited to one or more   of the following: automatic exposure control, adjustment of the mA and/or   kV according to patient size, use of iterative reconstruction technique.   In addition, our institution participates in a radiation dose monitoring   program to optimize patient radiation exposure.    FINDINGS:     Pulmonary arteries: No pulmonary emboli identified.    Aorta/great vessels: No aneurysm.    Thoracic inlet/central airways: Thyroid  normal. Adherent mucus within  the   trachea and major bronchi..    Mediastinum/hila/axilla: Patulous esophagus. Hiatal  hernia. Mediastinal   lymph nodes largest in the subcarinal region measuring up to 11 mm.   Bilateral axillary masses largest on the right side measuring 4 x 6 cm.    Heart: Normal heart size. No pericardial effusion.    Lungs/pleura: Similar postradiation changes in the right lung. Improved   right lung aeration with decreased bronchocentric consolidation. Resolved   bilateral pleural effusions. Scattered subsegmental atelectasis.   Background of moderate centrilobular emphysema. New 7 mm left lower lobe   solid nodule (5:56). Another new 3 mm solid nodule in the left upper lobe   (5:38). Stable 14 mm solid nodule in the right lower lobe (5:90). Other   sub-6 mm solid nodules annotated on series 5.    Upper abdomen: Hypodense left adrenal mass measuring 4 x 4 centimeters.   Bulky adreniform appearance of the right adrenal gland. Soft tissue mass   within the posterior right upper respiratory region. Multiple soft tissue   lesions within the mesentery fat. Kindly refer to the CT abdomen report   done on the same day for more details.    Chest wall/MSK: Similar polyarticular degenerative changes. Similar   compression fracture of T3 vertebral body. Healed sternal fracture.    IMPRESSION:  1.  No pulmonary embolism.   2.  Stable right lung postradiation changes.   3.  Resolved bilateral pleural effusion and adjacent atelectasis.  4.  New scattered solid pulmonary nodules, bilateral axillary masses,   bilateral adrenal masses, and multiple upper abdominal masses, findings   are consistent with progressive metastatic disease.    CT Head WO Contrast W Quant CT Tiss Character When Performed  Final Result by Debby Arlyss Mose, MD (01/14 1634)  CT HEAD WITHOUT CONTRAST, 01/31/2024 3:46 PM    INDICATION: headache     COMPARISON: CT head 10/22/2022    TECHNIQUE: Axial CT images of the brain from skull base to vertex,   including portions of the face and sinuses, were obtained without    contrast. Supplemental 2D reformatted images were generated and reviewed   as needed.    All CT scans at [facility] and [facility]   Imaging are performed using radiation dose optimization techniques as   appropriate to a performed exam, including but not limited to one or more   of the following: automatic exposure control, adjustment of the mA and/or   kV according to patient size, use of iterative reconstruction technique.   In addition, our institution participates in a radiation dose monitoring   program to optimize patient radiation exposure.    FINDINGS:  Calvarium/skull base: No evidence of acute fracture or destructive lesion.   Mastoids and middle ears demonstrate no substantial mucosal disease.    Paranasal sinuses: No air fluid levels. Partial opacification of the right   sphenoid sinus.    Brain: New 2.8 x 3.7 x 1.9 cm lesion in the right cerebellar hemisphere   (series 2 image 7) and 1.7 x 1.5 x 0.2 cm lesion in the left cerebellar   hemisphere (series 2 image 8) with associated perilesional edema and mild   local mass effect. Minimal compression of the fourth ventricle without   evidence of hydrocephalus. No cerebellar tonsillar herniation. No acute   large vascular territory infarct. No acute hemorrhage. Generalized   parenchymal volume loss and patchy subcortical and periventricular white   matter changes, progressed  from prior. Intracranial atherosclerosis.    IMPRESSION:    1. Bilateral cerebellar mass lesions with associated perilesional edema   and mild local mass effect, highly suspicious for intracranial metastatic   disease. MR could further evaluate if clinically indicated.      Findings were discussed via phone with JONATHAN HARRIS WOODY, MD by Lang Cleveland, DO at 01/31/2024 4:27 PM.     XR Chest 1 View  Final Result by Bettina Counter, MD (01/14 1202)  XR CHEST 1 VIEW, 01/31/2024 11:26 AM    INDICATION:chest pain    COMPARISON: 03/16/2023    FINDINGS:    Cardiac silhouette is within normal limits. Aortic arch  atherosclerosis.     Stable right basilar patchy opacities. Otherwise lungs are clear. No   pneumothorax or pleural effusions.     No acute osseous abnormality. Extensive degenerative changes of the right   shoulder.      IMPRESSION:    Stable right basilar patchy opacities.      CULTURES:  No results found for this visit on 01/31/24 (from the past week). PATHOLOGY: none OTHER: none  Surgeries/Procedures:     Other procedures performed: none  Consults:   IP CONSULT TO HOSPITALIST IP CONSULT TO PALLIATIVE CARE   Follow-up Appointments:     Future Appointments  Date Time Provider Department Center  02/22/2024  2:30 PM Reena Saris Argenta, [place], APRN [facility] CAR [facility] [address] [facility]  03/13/2024 12:30 PM [facility] DEXA1 [facility] RAD MG [facility]  03/13/2024  1:20 PM [facility] MG2 [facility] RAD MG [facility]  03/13/2024  1:40 PM [facility] MG US1 [facility] RAD MG [facility]      Predictive Model Details        23.4% (Medium)  Factor Value   Calculated 02/02/2024 16:05 11% Braden score 15   Readmission Risk Score v2 Model 9% Latest hemoglobin in last 72 hrs 7.3 g/dL    9% Number of appointments in last 90 days 0    7% Number of ED visits in last 90 days 1    6% Number of active outpatient medication orders 19      Electronically signed by: Hubert Nat Gins, NP 02/02/2024 5:57 PM Time spent on discharge: 45 minutes.

## 2024-02-02 NOTE — Telephone Encounter (Signed)
 Pts daughter called to inform Dr Sherrod that pt is currently admitted in [facility]. Daughter wants Dr Sherrod recommendation regarding next steps. Continue treatment or palliative.

## 2024-02-02 NOTE — Progress Notes (Incomplete)
 GU Location of Tumor / Histology: non-small cell lung cancer  Anna Finley presented 2 months ago with signs/symptoms from imaging  Biopsies of RLL of right lung on 04/20/21 revealed:    Past/Anticipated interventions by urology, if any: None   Past/Anticipated interventions by medical oncology, if any:  She initiated osimertinib  in May 2023, which was discontinued in February 2025 after 21 months due to QT prolongation. She has not received systemic therapy since that time, with significant gaps in follow-up and delayed imaging due to transportation barriers and changes in home nursing support.  Recent imaging from October 2025 demonstrated new metastatic disease with a 3.6 x 2.8 cm mass in the left adrenal gland and a 1.9 x 1.2 cm mass in the right adrenal gland, both not present on prior studies. She is not currently receiving active oncologic therapy.   Weight changes, if any: {:18581}  Bowel/Bladder complaints, if any: {:18581}   Nausea/Vomiting, if any: {:18581}  Pain issues, if any:  {:18581}  SAFETY ISSUES: Prior radiation? yes Pacemaker/ICD? {:18581} Possible current pregnancy? no Is the patient on methotrexate? no  Current Complaints / other details:  ***

## 2024-02-03 NOTE — Telephone Encounter (Signed)
 Oxycodone  reordered as 5mg  tablet due to pharmacy not carrying 2.5 mg tablet. Order is to take 1/2 tablet (2.5mg ) every 4 hours as needed for moderate to severe pain.

## 2024-02-06 NOTE — Progress Notes (Incomplete)
 " Radiation Oncology         (336) [phone] ________________________________  Outpatient Re-Consultation - Conducted via telephone at patient request.  I spoke with the patient's daughter to conduct this consult visit via telephone since the patient's number listed is actually her daughters.    Name: Myrth Dahan        MRN: [id number]  Date of Service: 02/08/2024 DOB: [birth year]  RR:Ndzp-Anwdl, Zachary, MD  Sherrod Sherrod, MD     REFERRING PHYSICIAN: Sherrod Sherrod, MD   DIAGNOSIS: The primary encounter diagnosis was Malignant neoplasm of lower lobe of right lung [facility]). 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   HISTORY OF PRESENT ILLNESS: Tarnisha Kachmar is a 79 y.o. female seen at the request of Dr. Sherrod for a diagnosis of  Stage IV NSCLC, adenocarcinoma,  EGFR mutated of the RLL. She was originally diagnosed in the spring of 2023. She received palliative radiation in our clinic to the lung and T3 spine, and was started on Tagrisso  in May 2023. Tagrisso  has been on hold however since February 2025 due to prolonged QT intervals.   CT on 05/30/22 showed concerns for an increase in a right adrenal nodule measuring 1.9 cm, previously 1.6 cm in October 2024 and she was treated with SBRT treatment to the nodule which she completed in August 2025. She had a CT on 11/08/23 that showed a new left adrenal nodule measuring 3.6 cm and she was offered evaluation in our clinic to treat the left adrenal nodule. In the interim from December 2025, she was admitted at [facility] through the [facility] and was found on CT of the brain to have multiple lesions in the brain including a right cerebellar lesion measuring 3.7 cm, and a 1.7 cm left cerebellar lesion. CT Angio on 01/31/24 also showed no embolism but new scattered pulmonary nodules, bilateral axillary masses, bilateral adrenal masses, and upper  abdominal masses. An MRI brain on 02/01/24 showed numerous intra-axila lesions within the cerebellum and two lesions in the left temporal lobe, the largest measuring 2.9 cm in the right cerebellar hemisphere. Evidence of microhemorrhage in the right thalamus was favored as chronic sequela of microangiopathy since there was no enhancement in the area. There was also mass effect within the posterior fossa resulting in mild narrowing of the fourth ventricle, and mild rounding of the temporal horns of the lateral ventricles. She also had a marrow replacing right calvarial lesion, punctate foci of diffusion restriction in the right pons and posterior right temporal lobe. She was started on Dexamethasone  4 mg*** She's seen today to consider additional palliative radiotherapy.    PREVIOUS RADIATION THERAPY: Yes   08/15/23-08/25/23:   Plan Name: Abd_R_SBRT Site: Adrenal Gland, Right Technique: SBRT/SRT-IMRT Mode: Photon Dose Per Fraction: 10 Gy Prescribed Dose (Delivered / Prescribed): 50 Gy / 50 Gy Prescribed Fxs (Delivered / Prescribed): 5 / 5   04/21/2021 through 05/05/2021 Site Technique Total Dose (Gy) Dose per Fx (Gy) Completed Fx Beam Energies  Lung, Right: Lung_R 3D 30/30 3 10/10 6X, 10X, 15X  Thoracic Spine: Spine_T3 3D 30/30 3 10/10 10X, 15X     PAST MEDICAL HISTORY:  Past Medical History:  Diagnosis Date   COPD (chronic obstructive pulmonary disease) with chronic bronchitis (HCC)    HFrEF (heart failure with reduced ejection fraction) (HCC) 01/23/2023   HTN (hypertension)    Pneumothorax        PAST  SURGICAL HISTORY: Past Surgical History:  Procedure Laterality Date   BRONCHIAL NEEDLE ASPIRATION BIOPSY  04/20/2021   Procedure: BRONCHIAL NEEDLE ASPIRATION BIOPSIES;  Surgeon: Claudene Toribio BROCKS, MD;  Location: [facility];  Service: Endoscopy;;   BRONCHIAL WASHINGS  04/20/2021   Procedure: BRONCHIAL WASHINGS;  Surgeon: Claudene Toribio BROCKS, MD;  Location: [facility];  Service: Endoscopy;;    ENDOBRONCHIAL ULTRASOUND N/A 04/20/2021   Procedure: ENDOBRONCHIAL ULTRASOUND;  Surgeon: Claudene Toribio BROCKS, MD;  Location: [facility];  Service: Endoscopy;  Laterality: N/A;   HIP ARTHROPLASTY     VIDEO BRONCHOSCOPY Left 04/20/2021   Procedure: VIDEO BRONCHOSCOPY WITH FLUORO;  Surgeon: Claudene Toribio BROCKS, MD;  Location: [facility];  Service: Endoscopy;  Laterality: Left;  EBUS     FAMILY HISTORY:  Family History  Problem Relation Age of Onset   Cancer Father 37       stomach     SOCIAL HISTORY:  reports that she quit smoking about 13 years ago. Her smoking use included cigarettes. She started smoking about 66 years ago. She has a 26.5 pack-year smoking history. She quit smokeless tobacco use about 13 years ago. She reports that she does not currently use alcohol. She reports that she does not use drugs. The patient is widowed and lives in [place]. She is helped by her adult children with appointments and transportation for medical care.   ALLERGIES: Patient has no known allergies.   MEDICATIONS:  Current Outpatient Medications  Medication Sig Dispense Refill   acetaminophen  (TYLENOL ) 500 MG tablet Take 500 mg by mouth in the morning, at noon, and at bedtime.     alum & mag hydroxide-simeth (MAALOX/MYLANTA) 200-200-20 MG/5ML suspension Take 30 mLs by mouth every 4 (four) hours as needed for indigestion or heartburn.     arformoterol  (BROVANA ) 15 MCG/2ML NEBU Take 2 mLs (15 mcg total) by nebulization 2 (two) times daily.     aspirin  EC 81 MG tablet Take 1 tablet (81 mg total) by mouth daily.     budesonide  (PULMICORT ) 0.5 MG/2ML nebulizer solution Take 2 mLs (0.5 mg total) by nebulization 2 (two) times daily for 5 days.     dapagliflozin  propanediol (FARXIGA ) 10 MG TABS tablet Take 1 tablet (10 mg total) by mouth daily.     furosemide  (LASIX ) 40 MG tablet Take 1 tablet (40 mg total) by mouth daily.     ipratropium-albuterol  (DUONEB) 0.5-2.5 (3) MG/3ML SOLN Take 3 mLs by nebulization every  6 (six) hours as needed.     lidocaine  (LIDODERM ) 5 % Place 1 patch onto the skin daily. Remove & Discard patch within 12 hours or as directed by MD 30 patch 0   metoprolol  succinate (TOPROL -XL) 25 MG 24 hr tablet Take 1 tablet (25 mg total) by mouth daily.     montelukast  (SINGULAIR ) 10 MG tablet Take 10 mg by mouth at bedtime.     Multiple Vitamin (MULTIVITAMIN WITH MINERALS) TABS tablet Take 1 tablet by mouth daily.     omeprazole (PRILOSEC) 20 MG capsule Take 20 mg by mouth 2 (two) times daily.     [Paused] osimertinib  mesylate (TAGRISSO ) 80 MG tablet Take 1 tablet (80 mg total) by mouth daily. 30 tablet 3   pantoprazole  (PROTONIX ) 40 MG tablet Take 1 tablet (40 mg total) by mouth daily. (Patient not taking: Reported on 03/30/2023)     polyethylene glycol (MIRALAX  / GLYCOLAX ) 17 g packet Take 17 g by mouth daily as needed for mild constipation or moderate constipation. Meds  to bed, thanks     potassium chloride  SA (KLOR-CON  M) 20 MEQ tablet Take 1 tablet (20 mEq total) by mouth 2 (two) times daily. 16 tablet 0   revefenacin  (YUPELRI ) 175 MCG/3ML nebulizer solution Take 3 mLs (175 mcg total) by nebulization daily.     rosuvastatin  (CRESTOR ) 20 MG tablet Take 1 tablet (20 mg total) by mouth daily.     No current facility-administered medications for this visit.     REVIEW OF SYSTEMS: On review of systems, the patient's daughter states she's doing pretty well without any specific complaints.      PHYSICAL EXAM:  Unable to assess due to encounter types    ECOG = 0  0 - Asymptomatic (Fully active, able to carry on all predisease activities without restriction)  1 - Symptomatic but completely ambulatory (Restricted in physically strenuous activity but ambulatory and able to carry out work of a light or sedentary nature. For example, light housework, office work)  2 - Symptomatic, <50% in bed during the day (Ambulatory and capable of all self care but unable to carry out any work  activities. Up and about more than 50% of waking hours)  3 - Symptomatic, >50% in bed, but not bedbound (Capable of only limited self-care, confined to bed or chair 50% or more of waking hours)  4 - Bedbound (Completely disabled. Cannot carry on any self-care. Totally confined to bed or chair)  5 - Death   Raylene MM, Creech RH, Tormey DC, et al. [phone]). Toxicity and response criteria of the [facility]. Am. DOROTHA Bridges. Oncol. 5 (6): 649-55    LABORATORY DATA:  Lab Results  Component Value Date   WBC 9.7 01/16/2024   HGB 9.2 (L) 01/16/2024   HCT 27.5 (L) 01/16/2024   MCV 88.4 01/16/2024   PLT 504 (H) 01/16/2024   Lab Results  Component Value Date   NA 133 (L) 01/16/2024   K 4.3 01/16/2024   CL 99 01/16/2024   CO2 24 01/16/2024   Lab Results  Component Value Date   ALT 5 01/16/2024   AST 42 (H) 01/16/2024   ALKPHOS 255 (H) 01/16/2024   BILITOT 0.5 01/16/2024      RADIOGRAPHY: No results found.     IMPRESSION/PLAN: 1. Progressive Stage IV NSCLC, adenocarcinoma , EGFR mutated of the RLL with adrenal soft tissue, and brain disease.   In a visit lasting *** minutes, greater than 50% of the time was spent face to face discussing the patient's condition, in preparation for the discussion, and coordinating the patient's care.   The above documentation reflects my direct findings during this shared patient visit. Please see the separate note by Dr. Dewey on this date for the remainder of the patient's plan of care.       Donald KYM Husband, [facility]   **Disclaimer: This note was dictated with voice recognition software. Similar sounding words can inadvertently be transcribed and this note may contain transcription errors which may not have been corrected upon publication of note.**  "

## 2024-02-07 ENCOUNTER — Inpatient Hospital Stay
Admission: RE | Admit: 2024-02-07 | Discharge: 2024-02-07 | Disposition: A | Payer: Self-pay | Source: Ambulatory Visit | Attending: Radiation Oncology | Admitting: Radiation Oncology

## 2024-02-07 ENCOUNTER — Other Ambulatory Visit: Payer: Self-pay | Admitting: Radiation Oncology

## 2024-02-07 DIAGNOSIS — C3490 Malignant neoplasm of unspecified part of unspecified bronchus or lung: Secondary | ICD-10-CM

## 2024-02-08 ENCOUNTER — Ambulatory Visit

## 2024-02-08 ENCOUNTER — Ambulatory Visit: Admitting: Radiation Oncology

## 2024-02-08 DIAGNOSIS — C7971 Secondary malignant neoplasm of right adrenal gland: Secondary | ICD-10-CM

## 2024-02-08 DIAGNOSIS — C3431 Malignant neoplasm of lower lobe, right bronchus or lung: Secondary | ICD-10-CM

## 2024-02-09 ENCOUNTER — Telehealth: Payer: Self-pay | Admitting: Internal Medicine

## 2024-02-09 NOTE — Telephone Encounter (Signed)
 Rescheduled appointment per staff message. Talked with the patient and she is aware of the changes made to her upcoming appointments.

## 2024-02-12 NOTE — Progress Notes (Incomplete)
 " Radiation Oncology         [phone]) [phone] ________________________________  Outpatient Re-Consultation - Conducted via telephone at patient request.  I spoke with the patient's daughter to conduct this consult visit via telephone since the patient's number listed is actually her daughters.    Name: Anna Finley        MRN: [id number]  Date of Service: 02/15/2024 DOB: 21-Aug-1945  RR:Ndzp-Anwdl, Zachary, MD  Sherrod Sherrod, MD     REFERRING PHYSICIAN: Sherrod Sherrod, MD   DIAGNOSIS: The primary encounter diagnosis was Malignant neoplasm of lower lobe of right lung [facility]). 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   HISTORY OF PRESENT ILLNESS: Anna Finley is a 79 y.o. female seen at the request of Dr. Sherrod for a diagnosis of  Stage IV NSCLC, adenocarcinoma,  EGFR mutated of the RLL. She was originally diagnosed in the spring of 2023. She received palliative radiation in our clinic to the lung and T3 spine, and was started on Tagrisso  in May 2023. Tagrisso  has been on hold however since February 2025 due to prolonged QT intervals.   CT on 05/30/22 showed concerns for an increase in a right adrenal nodule measuring 1.9 cm, previously 1.6 cm in October 2024 and she was treated with SBRT treatment to the nodule which she completed in August 2025. She had a CT on 11/08/23 that showed a new left adrenal nodule measuring 3.6 cm and she was offered evaluation in our clinic to treat the left adrenal nodule. In the interim from December 2025, she was admitted at [facility] through the [facility] and was found on CT of the brain to have multiple lesions in the brain including a right cerebellar lesion measuring 3.7 cm, and a 1.7 cm left cerebellar lesion. CT Angio on 01/31/24 also showed no embolism but new scattered pulmonary nodules, bilateral axillary masses, bilateral adrenal masses, and upper  abdominal masses. An MRI brain on 02/01/24 showed numerous intra-axila lesions within the cerebellum and two lesions in the left temporal lobe, the largest measuring 2.9 cm in the right cerebellar hemisphere. Evidence of microhemorrhage in the right thalamus was favored as chronic sequela of microangiopathy since there was no enhancement in the area. There was also mass effect within the posterior fossa resulting in mild narrowing of the fourth ventricle, and mild rounding of the temporal horns of the lateral ventricles. She also had a marrow replacing right calvarial lesion, punctate foci of diffusion restriction in the right pons and posterior right temporal lobe. She was started on Dexamethasone  4 mg*** She's seen today to consider additional palliative radiotherapy.    PREVIOUS RADIATION THERAPY: Yes   08/15/23-08/25/23:   Plan Name: Abd_R_SBRT Site: Adrenal Gland, Right Technique: SBRT/SRT-IMRT Mode: Photon Dose Per Fraction: 10 Gy Prescribed Dose (Delivered / Prescribed): 50 Gy / 50 Gy Prescribed Fxs (Delivered / Prescribed): 5 / 5   04/21/2021 through 05/05/2021 Site Technique Total Dose (Gy) Dose per Fx (Gy) Completed Fx Beam Energies  Lung, Right: Lung_R 3D 30/30 3 10/10 6X, 10X, 15X  Thoracic Spine: Spine_T3 3D 30/30 3 10/10 10X, 15X     PAST MEDICAL HISTORY:  Past Medical History:  Diagnosis Date   COPD (chronic obstructive pulmonary disease) with chronic bronchitis (HCC)    HFrEF (heart failure with reduced ejection fraction) (HCC) 01/23/2023   HTN (hypertension)    Pneumothorax        PAST  SURGICAL HISTORY: Past Surgical History:  Procedure Laterality Date   BRONCHIAL NEEDLE ASPIRATION BIOPSY  04/20/2021   Procedure: BRONCHIAL NEEDLE ASPIRATION BIOPSIES;  Surgeon: Claudene Toribio BROCKS, MD;  Location[facility];  Service: Endoscopy;;   BRONCHIAL WASHINGS  04/20/2021   Procedure: BRONCHIAL WASHINGS;  Surgeon: Claudene Toribio BROCKS, MD;  Location: [facility];  Service: Endoscopy;;    ENDOBRONCHIAL ULTRASOUND N/A 04/20/2021   Procedure: ENDOBRONCHIAL ULTRASOUND;  Surgeon: Claudene Toribio BROCKS, MD;  Location: [facility];  Service: Endoscopy;  Laterality: N/A;   HIP ARTHROPLASTY     VIDEO BRONCHOSCOPY Left 04/20/2021   Procedure: VIDEO BRONCHOSCOPY WITH FLUORO;  Surgeon: Claudene Toribio BROCKS, MD;  Location[facility];  Service: Endoscopy;  Laterality: Left;  EBUS     FAMILY HISTORY:  Family History  Problem Relation Age of Onset   Cancer Father 70       stomach     SOCIAL HISTORY:  reports that she quit smoking about 13 years ago. Her smoking use included cigarettes. She started smoking about 66 years ago. She has a 26.5 pack-year smoking history. She quit smokeless tobacco use about 13 years ago. She reports that she does not currently use alcohol. She reports that she does not use drugs. The patient is widowed and lives in [place]. She is helped by her adult children with appointments and transportation for medical care.   ALLERGIES: Patient has no known allergies.   MEDICATIONS:  Current Outpatient Medications  Medication Sig Dispense Refill   acetaminophen  (TYLENOL ) 500 MG tablet Take 500 mg by mouth in the morning, at noon, and at bedtime.     alum & mag hydroxide-simeth (MAALOX/MYLANTA) 200-200-20 MG/5ML suspension Take 30 mLs by mouth every 4 (four) hours as needed for indigestion or heartburn.     arformoterol  (BROVANA ) 15 MCG/2ML NEBU Take 2 mLs (15 mcg total) by nebulization 2 (two) times daily.     aspirin  EC 81 MG tablet Take 1 tablet (81 mg total) by mouth daily.     budesonide  (PULMICORT ) 0.5 MG/2ML nebulizer solution Take 2 mLs (0.5 mg total) by nebulization 2 (two) times daily for 5 days.     dapagliflozin  propanediol (FARXIGA ) 10 MG TABS tablet Take 1 tablet (10 mg total) by mouth daily.     furosemide  (LASIX ) 40 MG tablet Take 1 tablet (40 mg total) by mouth daily.     ipratropium-albuterol  (DUONEB) 0.5-2.5 (3) MG/3ML SOLN Take 3 mLs by nebulization every  6 (six) hours as needed.     lidocaine  (LIDODERM ) 5 % Place 1 patch onto the skin daily. Remove & Discard patch within 12 hours or as directed by MD 30 patch 0   metoprolol  succinate (TOPROL -XL) 25 MG 24 hr tablet Take 1 tablet (25 mg total) by mouth daily.     montelukast  (SINGULAIR ) 10 MG tablet Take 10 mg by mouth at bedtime.     Multiple Vitamin (MULTIVITAMIN WITH MINERALS) TABS tablet Take 1 tablet by mouth daily.     omeprazole (PRILOSEC) 20 MG capsule Take 20 mg by mouth 2 (two) times daily.     [Paused] osimertinib  mesylate (TAGRISSO ) 80 MG tablet Take 1 tablet (80 mg total) by mouth daily. 30 tablet 3   pantoprazole  (PROTONIX ) 40 MG tablet Take 1 tablet (40 mg total) by mouth daily. (Patient not taking: Reported on 03/30/2023)     polyethylene glycol (MIRALAX  / GLYCOLAX ) 17 g packet Take 17 g by mouth daily as needed for mild constipation or moderate constipation. Meds  to bed, thanks     potassium chloride  SA (KLOR-CON  M) 20 MEQ tablet Take 1 tablet (20 mEq total) by mouth 2 (two) times daily. 16 tablet 0   revefenacin  (YUPELRI ) 175 MCG/3ML nebulizer solution Take 3 mLs (175 mcg total) by nebulization daily.     rosuvastatin  (CRESTOR ) 20 MG tablet Take 1 tablet (20 mg total) by mouth daily.     No current facility-administered medications for this visit.     REVIEW OF SYSTEMS: On review of systems, the patient's daughter states she's doing pretty well without any specific complaints.      PHYSICAL EXAM:  Unable to assess due to encounter types    ECOG = 0  0 - Asymptomatic (Fully active, able to carry on all predisease activities without restriction)  1 - Symptomatic but completely ambulatory (Restricted in physically strenuous activity but ambulatory and able to carry out work of a light or sedentary nature. For example, light housework, office work)  2 - Symptomatic, <50% in bed during the day (Ambulatory and capable of all self care but unable to carry out any work  activities. Up and about more than 50% of waking hours)  3 - Symptomatic, >50% in bed, but not bedbound (Capable of only limited self-care, confined to bed or chair 50% or more of waking hours)  4 - Bedbound (Completely disabled. Cannot carry on any self-care. Totally confined to bed or chair)  5 - Death   Raylene MM, Creech RH, Tormey DC, et al. [phone]). Toxicity and response criteria of the [facility]. Am. DOROTHA Bridges. Oncol. 5 (6): 649-55    LABORATORY DATA:  Lab Results  Component Value Date   WBC 9.7 01/16/2024   HGB 9.2 (L) 01/16/2024   HCT 27.5 (L) 01/16/2024   MCV 88.4 01/16/2024   PLT 504 (H) 01/16/2024   Lab Results  Component Value Date   NA 133 (L) 01/16/2024   K 4.3 01/16/2024   CL 99 01/16/2024   CO2 24 01/16/2024   Lab Results  Component Value Date   ALT 5 01/16/2024   AST 42 (H) 01/16/2024   ALKPHOS 255 (H) 01/16/2024   BILITOT 0.5 01/16/2024      RADIOGRAPHY: No results found.     IMPRESSION/PLAN: 1. Progressive Stage IV NSCLC, adenocarcinoma , EGFR mutated of the RLL with adrenal soft tissue, and brain disease.   In a visit lasting *** minutes, greater than 50% of the time was spent face to face discussing the patient's condition, in preparation for the discussion, and coordinating the patient's care.   The above documentation reflects my direct findings during this shared patient visit. Please see the separate note by Dr. Dewey on this date for the remainder of the patient's plan of care.       Donald KYM Husband, [facility]   **Disclaimer: This note was dictated with voice recognition software. Similar sounding words can inadvertently be transcribed and this note may contain transcription errors which may not have been corrected upon publication of note.**  "

## 2024-02-15 ENCOUNTER — Ambulatory Visit: Admitting: Radiation Oncology

## 2024-02-15 ENCOUNTER — Ambulatory Visit

## 2024-02-15 DIAGNOSIS — C3431 Malignant neoplasm of lower lobe, right bronchus or lung: Secondary | ICD-10-CM

## 2024-02-15 DIAGNOSIS — C7971 Secondary malignant neoplasm of right adrenal gland: Secondary | ICD-10-CM

## 2024-02-18 DEATH — deceased

## 2024-02-20 ENCOUNTER — Inpatient Hospital Stay

## 2024-02-20 ENCOUNTER — Inpatient Hospital Stay: Admitting: Internal Medicine

## 2024-03-14 ENCOUNTER — Inpatient Hospital Stay: Admitting: Internal Medicine

## 2024-03-14 ENCOUNTER — Inpatient Hospital Stay
